# Patient Record
Sex: Female | Born: 1940 | Race: White | Hispanic: No | Smoking: Current every day smoker
Health system: Southern US, Community
[De-identification: ages and names within clinical notes are randomized; demographics above are authoritative.]

## PROBLEM LIST (undated history)

## (undated) DIAGNOSIS — G5600 Carpal tunnel syndrome, unspecified upper limb: Secondary | ICD-10-CM

## (undated) DIAGNOSIS — R0600 Dyspnea, unspecified: Secondary | ICD-10-CM

## (undated) DIAGNOSIS — E559 Vitamin D deficiency, unspecified: Secondary | ICD-10-CM

## (undated) DIAGNOSIS — C50919 Malignant neoplasm of unspecified site of unspecified female breast: Secondary | ICD-10-CM

## (undated) DIAGNOSIS — J189 Pneumonia, unspecified organism: Secondary | ICD-10-CM

## (undated) DIAGNOSIS — J449 Chronic obstructive pulmonary disease, unspecified: Secondary | ICD-10-CM

## (undated) DIAGNOSIS — R42 Dizziness and giddiness: Secondary | ICD-10-CM

## (undated) DIAGNOSIS — M810 Age-related osteoporosis without current pathological fracture: Secondary | ICD-10-CM

## (undated) DIAGNOSIS — E213 Hyperparathyroidism, unspecified: Secondary | ICD-10-CM

## (undated) DIAGNOSIS — E785 Hyperlipidemia, unspecified: Secondary | ICD-10-CM

## (undated) DIAGNOSIS — M069 Rheumatoid arthritis, unspecified: Secondary | ICD-10-CM

## (undated) HISTORY — PX: MASTECTOMY: SHX3

## (undated) HISTORY — DX: Dizziness and giddiness: R42

## (undated) HISTORY — DX: Age-related osteoporosis without current pathological fracture: M81.0

## (undated) HISTORY — PX: VESICOVAGINAL FISTULA CLOSURE W/ TAH: SUR271

## (undated) HISTORY — DX: Hyperlipidemia, unspecified: E78.5

## (undated) HISTORY — PX: CATARACT EXTRACTION: SUR2

## (undated) HISTORY — DX: Rheumatoid arthritis, unspecified: M06.9

## (undated) HISTORY — DX: Chronic obstructive pulmonary disease, unspecified: J44.9

## (undated) HISTORY — DX: Hypercalcemia: E83.52

## (undated) HISTORY — DX: Pneumonia, unspecified organism: J18.9

## (undated) HISTORY — DX: Hyperparathyroidism, unspecified: E21.3

## (undated) HISTORY — DX: Malignant neoplasm of unspecified site of unspecified female breast: C50.919

## (undated) HISTORY — DX: Vitamin D deficiency, unspecified: E55.9

## (undated) HISTORY — PX: CARPAL TUNNEL RELEASE: SHX101

## (undated) HISTORY — DX: Carpal tunnel syndrome, unspecified upper limb: G56.00

---

## 2009-08-21 DIAGNOSIS — M069 Rheumatoid arthritis, unspecified: Secondary | ICD-10-CM | POA: Insufficient documentation

## 2010-01-14 ENCOUNTER — Emergency Department (HOSPITAL_COMMUNITY): Admission: EM | Admit: 2010-01-14 | Discharge: 2010-01-14 | Payer: Self-pay | Admitting: Emergency Medicine

## 2011-01-08 DIAGNOSIS — Z79899 Other long term (current) drug therapy: Secondary | ICD-10-CM | POA: Insufficient documentation

## 2011-02-18 DIAGNOSIS — R5383 Other fatigue: Secondary | ICD-10-CM | POA: Insufficient documentation

## 2011-03-24 DIAGNOSIS — M898X9 Other specified disorders of bone, unspecified site: Secondary | ICD-10-CM | POA: Diagnosis not present

## 2011-03-24 DIAGNOSIS — D492 Neoplasm of unspecified behavior of bone, soft tissue, and skin: Secondary | ICD-10-CM | POA: Diagnosis not present

## 2011-03-24 DIAGNOSIS — M6740 Ganglion, unspecified site: Secondary | ICD-10-CM | POA: Diagnosis not present

## 2011-04-01 DIAGNOSIS — M898X9 Other specified disorders of bone, unspecified site: Secondary | ICD-10-CM | POA: Diagnosis not present

## 2011-04-01 DIAGNOSIS — M7130 Other bursal cyst, unspecified site: Secondary | ICD-10-CM | POA: Diagnosis not present

## 2011-04-01 DIAGNOSIS — M6740 Ganglion, unspecified site: Secondary | ICD-10-CM | POA: Diagnosis not present

## 2011-04-13 DIAGNOSIS — M069 Rheumatoid arthritis, unspecified: Secondary | ICD-10-CM | POA: Diagnosis not present

## 2011-04-13 DIAGNOSIS — Z79899 Other long term (current) drug therapy: Secondary | ICD-10-CM | POA: Diagnosis not present

## 2011-04-29 DIAGNOSIS — M069 Rheumatoid arthritis, unspecified: Secondary | ICD-10-CM | POA: Diagnosis not present

## 2011-05-13 DIAGNOSIS — M069 Rheumatoid arthritis, unspecified: Secondary | ICD-10-CM | POA: Diagnosis not present

## 2011-05-26 DIAGNOSIS — E785 Hyperlipidemia, unspecified: Secondary | ICD-10-CM | POA: Diagnosis not present

## 2011-05-26 DIAGNOSIS — M069 Rheumatoid arthritis, unspecified: Secondary | ICD-10-CM | POA: Diagnosis not present

## 2011-05-26 DIAGNOSIS — I10 Essential (primary) hypertension: Secondary | ICD-10-CM | POA: Diagnosis not present

## 2011-05-26 DIAGNOSIS — J449 Chronic obstructive pulmonary disease, unspecified: Secondary | ICD-10-CM | POA: Diagnosis not present

## 2011-05-26 DIAGNOSIS — R634 Abnormal weight loss: Secondary | ICD-10-CM | POA: Diagnosis not present

## 2011-06-17 DIAGNOSIS — M069 Rheumatoid arthritis, unspecified: Secondary | ICD-10-CM | POA: Diagnosis not present

## 2011-06-30 DIAGNOSIS — Z853 Personal history of malignant neoplasm of breast: Secondary | ICD-10-CM | POA: Diagnosis not present

## 2011-06-30 DIAGNOSIS — Z803 Family history of malignant neoplasm of breast: Secondary | ICD-10-CM | POA: Diagnosis not present

## 2011-06-30 DIAGNOSIS — Z9010 Acquired absence of unspecified breast and nipple: Secondary | ICD-10-CM | POA: Diagnosis not present

## 2011-07-15 DIAGNOSIS — Z79899 Other long term (current) drug therapy: Secondary | ICD-10-CM | POA: Diagnosis not present

## 2011-07-15 DIAGNOSIS — M069 Rheumatoid arthritis, unspecified: Secondary | ICD-10-CM | POA: Diagnosis not present

## 2011-08-11 DIAGNOSIS — M069 Rheumatoid arthritis, unspecified: Secondary | ICD-10-CM | POA: Diagnosis not present

## 2011-09-02 DIAGNOSIS — L600 Ingrowing nail: Secondary | ICD-10-CM | POA: Diagnosis not present

## 2011-09-02 DIAGNOSIS — M069 Rheumatoid arthritis, unspecified: Secondary | ICD-10-CM | POA: Diagnosis not present

## 2011-09-02 DIAGNOSIS — M2010 Hallux valgus (acquired), unspecified foot: Secondary | ICD-10-CM | POA: Diagnosis not present

## 2011-10-06 DIAGNOSIS — Z79899 Other long term (current) drug therapy: Secondary | ICD-10-CM | POA: Diagnosis not present

## 2011-10-06 DIAGNOSIS — M069 Rheumatoid arthritis, unspecified: Secondary | ICD-10-CM | POA: Diagnosis not present

## 2011-10-13 DIAGNOSIS — R21 Rash and other nonspecific skin eruption: Secondary | ICD-10-CM | POA: Diagnosis not present

## 2011-10-13 DIAGNOSIS — Z888 Allergy status to other drugs, medicaments and biological substances status: Secondary | ICD-10-CM | POA: Diagnosis not present

## 2011-10-13 DIAGNOSIS — I10 Essential (primary) hypertension: Secondary | ICD-10-CM | POA: Diagnosis not present

## 2011-10-13 DIAGNOSIS — M069 Rheumatoid arthritis, unspecified: Secondary | ICD-10-CM | POA: Diagnosis not present

## 2011-10-13 DIAGNOSIS — B028 Zoster with other complications: Secondary | ICD-10-CM | POA: Diagnosis not present

## 2011-10-29 DIAGNOSIS — M2040 Other hammer toe(s) (acquired), unspecified foot: Secondary | ICD-10-CM | POA: Diagnosis not present

## 2011-10-29 DIAGNOSIS — M2010 Hallux valgus (acquired), unspecified foot: Secondary | ICD-10-CM | POA: Diagnosis not present

## 2011-11-05 DIAGNOSIS — L723 Sebaceous cyst: Secondary | ICD-10-CM | POA: Diagnosis not present

## 2011-11-16 DIAGNOSIS — J42 Unspecified chronic bronchitis: Secondary | ICD-10-CM | POA: Diagnosis not present

## 2011-11-16 DIAGNOSIS — R05 Cough: Secondary | ICD-10-CM | POA: Diagnosis not present

## 2011-11-16 DIAGNOSIS — C50919 Malignant neoplasm of unspecified site of unspecified female breast: Secondary | ICD-10-CM | POA: Diagnosis not present

## 2011-11-16 DIAGNOSIS — M069 Rheumatoid arthritis, unspecified: Secondary | ICD-10-CM | POA: Diagnosis not present

## 2011-11-16 DIAGNOSIS — R059 Cough, unspecified: Secondary | ICD-10-CM | POA: Diagnosis not present

## 2011-11-16 DIAGNOSIS — J449 Chronic obstructive pulmonary disease, unspecified: Secondary | ICD-10-CM | POA: Diagnosis not present

## 2011-11-18 DIAGNOSIS — J449 Chronic obstructive pulmonary disease, unspecified: Secondary | ICD-10-CM | POA: Diagnosis not present

## 2011-11-18 DIAGNOSIS — J42 Unspecified chronic bronchitis: Secondary | ICD-10-CM | POA: Diagnosis not present

## 2011-12-01 DIAGNOSIS — R059 Cough, unspecified: Secondary | ICD-10-CM | POA: Diagnosis not present

## 2011-12-01 DIAGNOSIS — J449 Chronic obstructive pulmonary disease, unspecified: Secondary | ICD-10-CM | POA: Diagnosis not present

## 2011-12-01 DIAGNOSIS — R05 Cough: Secondary | ICD-10-CM | POA: Diagnosis not present

## 2011-12-08 DIAGNOSIS — M069 Rheumatoid arthritis, unspecified: Secondary | ICD-10-CM | POA: Diagnosis not present

## 2011-12-24 DIAGNOSIS — Z23 Encounter for immunization: Secondary | ICD-10-CM | POA: Diagnosis not present

## 2011-12-24 DIAGNOSIS — J4489 Other specified chronic obstructive pulmonary disease: Secondary | ICD-10-CM | POA: Diagnosis not present

## 2011-12-24 DIAGNOSIS — J449 Chronic obstructive pulmonary disease, unspecified: Secondary | ICD-10-CM | POA: Diagnosis not present

## 2011-12-24 DIAGNOSIS — J42 Unspecified chronic bronchitis: Secondary | ICD-10-CM | POA: Diagnosis not present

## 2012-01-12 DIAGNOSIS — K620 Anal polyp: Secondary | ICD-10-CM | POA: Diagnosis not present

## 2012-01-12 DIAGNOSIS — K5730 Diverticulosis of large intestine without perforation or abscess without bleeding: Secondary | ICD-10-CM | POA: Diagnosis not present

## 2012-01-12 DIAGNOSIS — K621 Rectal polyp: Secondary | ICD-10-CM | POA: Diagnosis not present

## 2012-01-12 DIAGNOSIS — D126 Benign neoplasm of colon, unspecified: Secondary | ICD-10-CM | POA: Diagnosis not present

## 2012-01-25 DIAGNOSIS — J449 Chronic obstructive pulmonary disease, unspecified: Secondary | ICD-10-CM | POA: Diagnosis not present

## 2012-01-25 DIAGNOSIS — J189 Pneumonia, unspecified organism: Secondary | ICD-10-CM | POA: Diagnosis not present

## 2012-01-25 DIAGNOSIS — M069 Rheumatoid arthritis, unspecified: Secondary | ICD-10-CM | POA: Diagnosis not present

## 2012-01-26 DIAGNOSIS — M069 Rheumatoid arthritis, unspecified: Secondary | ICD-10-CM | POA: Diagnosis not present

## 2012-01-26 DIAGNOSIS — Z79899 Other long term (current) drug therapy: Secondary | ICD-10-CM | POA: Diagnosis not present

## 2012-02-17 DIAGNOSIS — M810 Age-related osteoporosis without current pathological fracture: Secondary | ICD-10-CM | POA: Diagnosis not present

## 2012-03-07 DIAGNOSIS — M8448XA Pathological fracture, other site, initial encounter for fracture: Secondary | ICD-10-CM | POA: Diagnosis not present

## 2012-03-07 DIAGNOSIS — R059 Cough, unspecified: Secondary | ICD-10-CM | POA: Diagnosis not present

## 2012-03-07 DIAGNOSIS — R05 Cough: Secondary | ICD-10-CM | POA: Diagnosis not present

## 2012-03-07 DIAGNOSIS — J449 Chronic obstructive pulmonary disease, unspecified: Secondary | ICD-10-CM | POA: Diagnosis not present

## 2012-03-14 DIAGNOSIS — M949 Disorder of cartilage, unspecified: Secondary | ICD-10-CM | POA: Diagnosis not present

## 2012-03-14 DIAGNOSIS — M899 Disorder of bone, unspecified: Secondary | ICD-10-CM | POA: Diagnosis not present

## 2012-03-23 DIAGNOSIS — M069 Rheumatoid arthritis, unspecified: Secondary | ICD-10-CM | POA: Diagnosis not present

## 2012-05-03 DIAGNOSIS — M069 Rheumatoid arthritis, unspecified: Secondary | ICD-10-CM | POA: Diagnosis not present

## 2012-05-04 DIAGNOSIS — Z79899 Other long term (current) drug therapy: Secondary | ICD-10-CM | POA: Diagnosis not present

## 2012-05-04 DIAGNOSIS — E785 Hyperlipidemia, unspecified: Secondary | ICD-10-CM | POA: Diagnosis not present

## 2012-05-04 DIAGNOSIS — J4489 Other specified chronic obstructive pulmonary disease: Secondary | ICD-10-CM | POA: Diagnosis not present

## 2012-05-04 DIAGNOSIS — M069 Rheumatoid arthritis, unspecified: Secondary | ICD-10-CM | POA: Diagnosis not present

## 2012-05-04 DIAGNOSIS — R634 Abnormal weight loss: Secondary | ICD-10-CM | POA: Diagnosis not present

## 2012-05-04 DIAGNOSIS — Z0000 Encounter for general adult medical examination without abnormal findings: Secondary | ICD-10-CM | POA: Diagnosis not present

## 2012-05-04 DIAGNOSIS — J449 Chronic obstructive pulmonary disease, unspecified: Secondary | ICD-10-CM | POA: Diagnosis not present

## 2012-05-04 DIAGNOSIS — R002 Palpitations: Secondary | ICD-10-CM | POA: Diagnosis not present

## 2012-05-17 DIAGNOSIS — M79609 Pain in unspecified limb: Secondary | ICD-10-CM | POA: Diagnosis not present

## 2012-05-17 DIAGNOSIS — M810 Age-related osteoporosis without current pathological fracture: Secondary | ICD-10-CM | POA: Diagnosis not present

## 2012-05-17 DIAGNOSIS — M069 Rheumatoid arthritis, unspecified: Secondary | ICD-10-CM | POA: Diagnosis not present

## 2012-05-17 DIAGNOSIS — M159 Polyosteoarthritis, unspecified: Secondary | ICD-10-CM | POA: Diagnosis not present

## 2012-06-06 DIAGNOSIS — E785 Hyperlipidemia, unspecified: Secondary | ICD-10-CM | POA: Diagnosis not present

## 2012-06-06 DIAGNOSIS — M069 Rheumatoid arthritis, unspecified: Secondary | ICD-10-CM | POA: Diagnosis not present

## 2012-06-06 DIAGNOSIS — J449 Chronic obstructive pulmonary disease, unspecified: Secondary | ICD-10-CM | POA: Diagnosis not present

## 2012-06-16 DIAGNOSIS — M069 Rheumatoid arthritis, unspecified: Secondary | ICD-10-CM | POA: Diagnosis not present

## 2012-06-28 DIAGNOSIS — M810 Age-related osteoporosis without current pathological fracture: Secondary | ICD-10-CM | POA: Diagnosis not present

## 2012-06-28 DIAGNOSIS — M159 Polyosteoarthritis, unspecified: Secondary | ICD-10-CM | POA: Diagnosis not present

## 2012-06-28 DIAGNOSIS — M069 Rheumatoid arthritis, unspecified: Secondary | ICD-10-CM | POA: Diagnosis not present

## 2012-07-28 DIAGNOSIS — M069 Rheumatoid arthritis, unspecified: Secondary | ICD-10-CM | POA: Diagnosis not present

## 2012-09-05 DIAGNOSIS — I872 Venous insufficiency (chronic) (peripheral): Secondary | ICD-10-CM | POA: Diagnosis not present

## 2012-09-08 DIAGNOSIS — M069 Rheumatoid arthritis, unspecified: Secondary | ICD-10-CM | POA: Diagnosis not present

## 2012-09-27 DIAGNOSIS — M47812 Spondylosis without myelopathy or radiculopathy, cervical region: Secondary | ICD-10-CM | POA: Diagnosis not present

## 2012-09-27 DIAGNOSIS — M810 Age-related osteoporosis without current pathological fracture: Secondary | ICD-10-CM | POA: Diagnosis not present

## 2012-09-27 DIAGNOSIS — M542 Cervicalgia: Secondary | ICD-10-CM | POA: Diagnosis not present

## 2012-09-27 DIAGNOSIS — M069 Rheumatoid arthritis, unspecified: Secondary | ICD-10-CM | POA: Diagnosis not present

## 2012-09-27 DIAGNOSIS — M159 Polyosteoarthritis, unspecified: Secondary | ICD-10-CM | POA: Diagnosis not present

## 2012-10-12 DIAGNOSIS — M069 Rheumatoid arthritis, unspecified: Secondary | ICD-10-CM | POA: Diagnosis not present

## 2012-11-08 DIAGNOSIS — D239 Other benign neoplasm of skin, unspecified: Secondary | ICD-10-CM | POA: Diagnosis not present

## 2012-11-08 DIAGNOSIS — L723 Sebaceous cyst: Secondary | ICD-10-CM | POA: Diagnosis not present

## 2012-11-08 DIAGNOSIS — L821 Other seborrheic keratosis: Secondary | ICD-10-CM | POA: Diagnosis not present

## 2012-11-16 DIAGNOSIS — M069 Rheumatoid arthritis, unspecified: Secondary | ICD-10-CM | POA: Diagnosis not present

## 2012-12-07 DIAGNOSIS — J4489 Other specified chronic obstructive pulmonary disease: Secondary | ICD-10-CM | POA: Diagnosis not present

## 2012-12-07 DIAGNOSIS — J449 Chronic obstructive pulmonary disease, unspecified: Secondary | ICD-10-CM | POA: Diagnosis not present

## 2012-12-07 DIAGNOSIS — Z23 Encounter for immunization: Secondary | ICD-10-CM | POA: Diagnosis not present

## 2012-12-07 DIAGNOSIS — M069 Rheumatoid arthritis, unspecified: Secondary | ICD-10-CM | POA: Diagnosis not present

## 2012-12-07 DIAGNOSIS — Z87898 Personal history of other specified conditions: Secondary | ICD-10-CM | POA: Diagnosis not present

## 2012-12-07 DIAGNOSIS — M810 Age-related osteoporosis without current pathological fracture: Secondary | ICD-10-CM | POA: Diagnosis not present

## 2012-12-07 DIAGNOSIS — I10 Essential (primary) hypertension: Secondary | ICD-10-CM | POA: Diagnosis not present

## 2012-12-20 DIAGNOSIS — M069 Rheumatoid arthritis, unspecified: Secondary | ICD-10-CM | POA: Diagnosis not present

## 2012-12-28 DIAGNOSIS — M069 Rheumatoid arthritis, unspecified: Secondary | ICD-10-CM | POA: Diagnosis not present

## 2012-12-28 DIAGNOSIS — M810 Age-related osteoporosis without current pathological fracture: Secondary | ICD-10-CM | POA: Diagnosis not present

## 2012-12-28 DIAGNOSIS — M159 Polyosteoarthritis, unspecified: Secondary | ICD-10-CM | POA: Diagnosis not present

## 2012-12-28 DIAGNOSIS — M542 Cervicalgia: Secondary | ICD-10-CM | POA: Diagnosis not present

## 2012-12-30 DIAGNOSIS — M810 Age-related osteoporosis without current pathological fracture: Secondary | ICD-10-CM | POA: Diagnosis not present

## 2013-01-24 DIAGNOSIS — M069 Rheumatoid arthritis, unspecified: Secondary | ICD-10-CM | POA: Diagnosis not present

## 2013-02-21 DIAGNOSIS — M069 Rheumatoid arthritis, unspecified: Secondary | ICD-10-CM | POA: Diagnosis not present

## 2013-03-21 DIAGNOSIS — I10 Essential (primary) hypertension: Secondary | ICD-10-CM | POA: Diagnosis not present

## 2013-03-21 DIAGNOSIS — L259 Unspecified contact dermatitis, unspecified cause: Secondary | ICD-10-CM | POA: Diagnosis not present

## 2013-03-21 DIAGNOSIS — M069 Rheumatoid arthritis, unspecified: Secondary | ICD-10-CM | POA: Diagnosis not present

## 2013-03-21 DIAGNOSIS — E213 Hyperparathyroidism, unspecified: Secondary | ICD-10-CM | POA: Diagnosis not present

## 2013-03-21 DIAGNOSIS — T50995A Adverse effect of other drugs, medicaments and biological substances, initial encounter: Secondary | ICD-10-CM | POA: Diagnosis not present

## 2013-03-21 DIAGNOSIS — J449 Chronic obstructive pulmonary disease, unspecified: Secondary | ICD-10-CM | POA: Diagnosis not present

## 2013-03-21 DIAGNOSIS — R21 Rash and other nonspecific skin eruption: Secondary | ICD-10-CM | POA: Diagnosis not present

## 2013-03-28 DIAGNOSIS — M069 Rheumatoid arthritis, unspecified: Secondary | ICD-10-CM | POA: Diagnosis not present

## 2013-03-30 DIAGNOSIS — M542 Cervicalgia: Secondary | ICD-10-CM | POA: Diagnosis not present

## 2013-03-30 DIAGNOSIS — M069 Rheumatoid arthritis, unspecified: Secondary | ICD-10-CM | POA: Diagnosis not present

## 2013-03-30 DIAGNOSIS — M159 Polyosteoarthritis, unspecified: Secondary | ICD-10-CM | POA: Diagnosis not present

## 2013-03-30 DIAGNOSIS — M810 Age-related osteoporosis without current pathological fracture: Secondary | ICD-10-CM | POA: Diagnosis not present

## 2013-05-08 DIAGNOSIS — M069 Rheumatoid arthritis, unspecified: Secondary | ICD-10-CM | POA: Diagnosis not present

## 2013-06-19 DIAGNOSIS — M069 Rheumatoid arthritis, unspecified: Secondary | ICD-10-CM | POA: Diagnosis not present

## 2013-07-05 DIAGNOSIS — M069 Rheumatoid arthritis, unspecified: Secondary | ICD-10-CM | POA: Diagnosis not present

## 2013-07-05 DIAGNOSIS — M542 Cervicalgia: Secondary | ICD-10-CM | POA: Diagnosis not present

## 2013-07-05 DIAGNOSIS — M159 Polyosteoarthritis, unspecified: Secondary | ICD-10-CM | POA: Diagnosis not present

## 2013-07-05 DIAGNOSIS — M810 Age-related osteoporosis without current pathological fracture: Secondary | ICD-10-CM | POA: Diagnosis not present

## 2013-07-13 DIAGNOSIS — L723 Sebaceous cyst: Secondary | ICD-10-CM | POA: Diagnosis not present

## 2013-07-13 DIAGNOSIS — Z808 Family history of malignant neoplasm of other organs or systems: Secondary | ICD-10-CM | POA: Diagnosis not present

## 2013-07-13 DIAGNOSIS — L259 Unspecified contact dermatitis, unspecified cause: Secondary | ICD-10-CM | POA: Diagnosis not present

## 2013-07-25 DIAGNOSIS — M069 Rheumatoid arthritis, unspecified: Secondary | ICD-10-CM | POA: Diagnosis not present

## 2013-08-29 DIAGNOSIS — M069 Rheumatoid arthritis, unspecified: Secondary | ICD-10-CM | POA: Diagnosis not present

## 2013-08-30 DIAGNOSIS — R634 Abnormal weight loss: Secondary | ICD-10-CM | POA: Diagnosis not present

## 2013-08-30 DIAGNOSIS — R05 Cough: Secondary | ICD-10-CM | POA: Diagnosis not present

## 2013-08-30 DIAGNOSIS — M069 Rheumatoid arthritis, unspecified: Secondary | ICD-10-CM | POA: Diagnosis not present

## 2013-08-30 DIAGNOSIS — J449 Chronic obstructive pulmonary disease, unspecified: Secondary | ICD-10-CM | POA: Diagnosis not present

## 2013-08-30 DIAGNOSIS — F17200 Nicotine dependence, unspecified, uncomplicated: Secondary | ICD-10-CM | POA: Diagnosis not present

## 2013-08-30 DIAGNOSIS — E785 Hyperlipidemia, unspecified: Secondary | ICD-10-CM | POA: Diagnosis not present

## 2013-08-30 DIAGNOSIS — R059 Cough, unspecified: Secondary | ICD-10-CM | POA: Diagnosis not present

## 2013-08-30 DIAGNOSIS — J984 Other disorders of lung: Secondary | ICD-10-CM | POA: Diagnosis not present

## 2013-09-11 DIAGNOSIS — F17200 Nicotine dependence, unspecified, uncomplicated: Secondary | ICD-10-CM | POA: Diagnosis not present

## 2013-09-11 DIAGNOSIS — Z8701 Personal history of pneumonia (recurrent): Secondary | ICD-10-CM | POA: Diagnosis not present

## 2013-09-11 DIAGNOSIS — M069 Rheumatoid arthritis, unspecified: Secondary | ICD-10-CM | POA: Diagnosis not present

## 2013-09-11 DIAGNOSIS — J449 Chronic obstructive pulmonary disease, unspecified: Secondary | ICD-10-CM | POA: Diagnosis not present

## 2013-09-26 DIAGNOSIS — M069 Rheumatoid arthritis, unspecified: Secondary | ICD-10-CM | POA: Diagnosis not present

## 2013-10-04 DIAGNOSIS — M159 Polyosteoarthritis, unspecified: Secondary | ICD-10-CM | POA: Diagnosis not present

## 2013-10-04 DIAGNOSIS — M542 Cervicalgia: Secondary | ICD-10-CM | POA: Diagnosis not present

## 2013-10-04 DIAGNOSIS — M069 Rheumatoid arthritis, unspecified: Secondary | ICD-10-CM | POA: Diagnosis not present

## 2013-10-04 DIAGNOSIS — M25549 Pain in joints of unspecified hand: Secondary | ICD-10-CM | POA: Diagnosis not present

## 2013-10-04 DIAGNOSIS — M810 Age-related osteoporosis without current pathological fracture: Secondary | ICD-10-CM | POA: Diagnosis not present

## 2013-11-02 DIAGNOSIS — M069 Rheumatoid arthritis, unspecified: Secondary | ICD-10-CM | POA: Diagnosis not present

## 2013-12-07 DIAGNOSIS — M0589 Other rheumatoid arthritis with rheumatoid factor of multiple sites: Secondary | ICD-10-CM | POA: Diagnosis not present

## 2013-12-07 DIAGNOSIS — M0680 Other specified rheumatoid arthritis, unspecified site: Secondary | ICD-10-CM | POA: Diagnosis not present

## 2013-12-13 ENCOUNTER — Other Ambulatory Visit: Payer: Self-pay | Admitting: Internal Medicine

## 2013-12-13 DIAGNOSIS — R109 Unspecified abdominal pain: Secondary | ICD-10-CM

## 2013-12-13 DIAGNOSIS — M069 Rheumatoid arthritis, unspecified: Secondary | ICD-10-CM | POA: Diagnosis not present

## 2013-12-13 DIAGNOSIS — E213 Hyperparathyroidism, unspecified: Secondary | ICD-10-CM | POA: Diagnosis not present

## 2013-12-13 DIAGNOSIS — Z23 Encounter for immunization: Secondary | ICD-10-CM | POA: Diagnosis not present

## 2013-12-13 DIAGNOSIS — J449 Chronic obstructive pulmonary disease, unspecified: Secondary | ICD-10-CM | POA: Diagnosis not present

## 2013-12-14 ENCOUNTER — Ambulatory Visit
Admission: RE | Admit: 2013-12-14 | Discharge: 2013-12-14 | Disposition: A | Discharge status: Home / Self Care | Payer: Medicare Other | Source: Ambulatory Visit | Attending: Internal Medicine | Admitting: Internal Medicine

## 2013-12-14 DIAGNOSIS — K6389 Other specified diseases of intestine: Secondary | ICD-10-CM | POA: Diagnosis not present

## 2013-12-14 DIAGNOSIS — K5900 Constipation, unspecified: Secondary | ICD-10-CM | POA: Diagnosis not present

## 2013-12-14 DIAGNOSIS — K4090 Unilateral inguinal hernia, without obstruction or gangrene, not specified as recurrent: Secondary | ICD-10-CM | POA: Diagnosis not present

## 2013-12-14 DIAGNOSIS — Z853 Personal history of malignant neoplasm of breast: Secondary | ICD-10-CM | POA: Diagnosis not present

## 2013-12-14 DIAGNOSIS — R109 Unspecified abdominal pain: Secondary | ICD-10-CM

## 2013-12-14 MED ORDER — IOHEXOL 300 MG/ML  SOLN
75.0000 mL | Freq: Once | INTRAMUSCULAR | Status: AC | PRN
  Administered 2013-12-14: 75 mL via INTRAVENOUS

## 2013-12-21 DIAGNOSIS — Z808 Family history of malignant neoplasm of other organs or systems: Secondary | ICD-10-CM | POA: Diagnosis not present

## 2013-12-21 DIAGNOSIS — L821 Other seborrheic keratosis: Secondary | ICD-10-CM | POA: Diagnosis not present

## 2013-12-21 DIAGNOSIS — D239 Other benign neoplasm of skin, unspecified: Secondary | ICD-10-CM | POA: Diagnosis not present

## 2013-12-21 DIAGNOSIS — L853 Xerosis cutis: Secondary | ICD-10-CM | POA: Diagnosis not present

## 2013-12-21 DIAGNOSIS — D1801 Hemangioma of skin and subcutaneous tissue: Secondary | ICD-10-CM | POA: Diagnosis not present

## 2013-12-21 DIAGNOSIS — L814 Other melanin hyperpigmentation: Secondary | ICD-10-CM | POA: Diagnosis not present

## 2014-01-04 DIAGNOSIS — M5030 Other cervical disc degeneration, unspecified cervical region: Secondary | ICD-10-CM | POA: Diagnosis not present

## 2014-01-04 DIAGNOSIS — M0589 Other rheumatoid arthritis with rheumatoid factor of multiple sites: Secondary | ICD-10-CM | POA: Diagnosis not present

## 2014-01-04 DIAGNOSIS — M810 Age-related osteoporosis without current pathological fracture: Secondary | ICD-10-CM | POA: Diagnosis not present

## 2014-01-04 DIAGNOSIS — M150 Primary generalized (osteo)arthritis: Secondary | ICD-10-CM | POA: Diagnosis not present

## 2014-01-08 DIAGNOSIS — M810 Age-related osteoporosis without current pathological fracture: Secondary | ICD-10-CM | POA: Diagnosis not present

## 2014-01-11 DIAGNOSIS — M0589 Other rheumatoid arthritis with rheumatoid factor of multiple sites: Secondary | ICD-10-CM | POA: Diagnosis not present

## 2014-02-06 DIAGNOSIS — M069 Rheumatoid arthritis, unspecified: Secondary | ICD-10-CM | POA: Diagnosis not present

## 2014-02-06 DIAGNOSIS — E785 Hyperlipidemia, unspecified: Secondary | ICD-10-CM | POA: Diagnosis not present

## 2014-02-06 DIAGNOSIS — E875 Hyperkalemia: Secondary | ICD-10-CM | POA: Diagnosis not present

## 2014-02-08 ENCOUNTER — Ambulatory Visit: Payer: Medicare Other | Admitting: Rehabilitative and Restorative Service Providers"

## 2014-02-13 DIAGNOSIS — M0589 Other rheumatoid arthritis with rheumatoid factor of multiple sites: Secondary | ICD-10-CM | POA: Diagnosis not present

## 2014-02-28 DIAGNOSIS — Z0000 Encounter for general adult medical examination without abnormal findings: Secondary | ICD-10-CM | POA: Diagnosis not present

## 2014-02-28 DIAGNOSIS — E875 Hyperkalemia: Secondary | ICD-10-CM | POA: Diagnosis not present

## 2014-02-28 DIAGNOSIS — E785 Hyperlipidemia, unspecified: Secondary | ICD-10-CM | POA: Diagnosis not present

## 2014-03-07 DIAGNOSIS — M0589 Other rheumatoid arthritis with rheumatoid factor of multiple sites: Secondary | ICD-10-CM | POA: Diagnosis not present

## 2014-03-07 DIAGNOSIS — E213 Hyperparathyroidism, unspecified: Secondary | ICD-10-CM | POA: Diagnosis not present

## 2014-03-07 DIAGNOSIS — R918 Other nonspecific abnormal finding of lung field: Secondary | ICD-10-CM | POA: Diagnosis not present

## 2014-03-07 DIAGNOSIS — E559 Vitamin D deficiency, unspecified: Secondary | ICD-10-CM | POA: Diagnosis not present

## 2014-03-12 DIAGNOSIS — E789 Disorder of lipoprotein metabolism, unspecified: Secondary | ICD-10-CM | POA: Diagnosis not present

## 2014-03-15 ENCOUNTER — Encounter: Payer: Self-pay | Admitting: Internal Medicine

## 2014-03-15 ENCOUNTER — Ambulatory Visit (INDEPENDENT_AMBULATORY_CARE_PROVIDER_SITE_OTHER)
Admission: RE | Admit: 2014-03-15 | Discharge: 2014-03-15 | Disposition: A | Discharge status: Home / Self Care | Payer: Medicare Other | Source: Ambulatory Visit | Attending: Internal Medicine | Admitting: Internal Medicine

## 2014-03-15 ENCOUNTER — Ambulatory Visit (INDEPENDENT_AMBULATORY_CARE_PROVIDER_SITE_OTHER): Payer: Medicare Other | Admitting: Internal Medicine

## 2014-03-15 ENCOUNTER — Encounter (INDEPENDENT_AMBULATORY_CARE_PROVIDER_SITE_OTHER): Payer: Self-pay

## 2014-03-15 VITALS — BP 118/68 | HR 73 | Temp 98.0°F | Ht 61.25 in | Wt 94.4 lb

## 2014-03-15 DIAGNOSIS — J449 Chronic obstructive pulmonary disease, unspecified: Secondary | ICD-10-CM

## 2014-03-15 DIAGNOSIS — R918 Other nonspecific abnormal finding of lung field: Secondary | ICD-10-CM | POA: Diagnosis not present

## 2014-03-15 DIAGNOSIS — Z853 Personal history of malignant neoplasm of breast: Secondary | ICD-10-CM | POA: Diagnosis not present

## 2014-03-15 DIAGNOSIS — F17210 Nicotine dependence, cigarettes, uncomplicated: Secondary | ICD-10-CM

## 2014-03-15 MED ORDER — MOMETASONE FURO-FORMOTEROL FUM 100-5 MCG/ACT IN AERO: 1.0000 | INHALATION_SPRAY | Freq: Two times a day (BID) | RESPIRATORY_TRACT | Status: DC

## 2014-03-15 NOTE — Patient Instructions (Addendum)
Plan A = Automatic = dulera 100 Take 2 puffs first thing in am and then another 2 puffs about 12 hours later.   Plan B = backup  Only use your albuterol (proair) as a rescue medication to be used if you can't catch your breath by resting or doing a relaxed purse lip breathing pattern.  - The less you use it, the better it will work when you need it. - Ok to use up to 2 puffs  every 4 hours if you must but call for immediate appointment if use goes up over your usual need - Don't leave home without it !!  (think of it like the spare tire for your car)   Please remember to go to the  x-ray department downstairs for your tests - we will call you with the results when they are available.  Ocean nasal spray   The key is to stop smoking completely before smoking completely stops you!      Please schedule a follow up office visit in 6 weeks, call sooner if needed

## 2014-03-15 NOTE — Progress Notes (Signed)
Subjective:    Patient ID: Olivia Hooper, female    DOB: [birth year]   MRN: [id number]  HPI  26 yowf "quit smoking" in Oct 2015 referred 03/15/14 to pulmonary clinic by [name] for eval of lung nodules:   03/15/2014 1st [facility] office visit/ Baltasar Twilley   Chief Complaint  Patient presents with  . Pulmonary Consult    Referred by Dr. [name] for eval of pulmonary nodules. Pt states that she had a chest chest done 4-5 yrs ago b/c she was having "alot of wheezing".  She states that she has cough and SOB "for a long time". She states she only gets SOB if she walks "a long distance" or walks up alot of steps. Her cough is mainly non prod. Sometimes produces some clear sputum in the am's.   indolent onset minimally progressive doe x 5 years x worse in hot or weather or inclines  can walk flat nl pace all day long with no resting symptoms Variably cough esp in am min clear mucus, not worse in am , never bloody  Has dulera and saba not using either.   No obvious day to day or daytime variabilty or assoc   cp or chest tightness, subjective wheeze overt sinus or hb symptoms. No unusual exp hx or h/o childhood pna/ asthma or knowledge of premature birth.  Sleeping ok without nocturnal  or early am exacerbation  of respiratory  c/o's or need for noct saba. Also denies any obvious fluctuation of symptoms with weather or environmental changes or other aggravating or alleviating factors except as outlined above   Current Medications, Allergies, Complete Past Medical History, Past Surgical History, Family History, and Social History were reviewed in [employer] record.          Review of Systems  Constitutional: Negative for fever, chills and unexpected weight change.  HENT: Positive for congestion. Negative for dental problem, ear pain, nosebleeds, postnasal drip, rhinorrhea, sinus pressure, sneezing, sore throat, trouble swallowing and voice change.   Eyes: Negative for  visual disturbance.  Respiratory: Positive for cough and shortness of breath. Negative for choking.   Cardiovascular: Negative for chest pain and leg swelling.  Gastrointestinal: Negative for vomiting, abdominal pain and diarrhea.  Genitourinary: Negative for difficulty urinating.  Musculoskeletal: Positive for arthralgias.  Skin: Positive for rash.  Neurological: Negative for tremors, syncope and headaches.  Hematological: Does not bruise/bleed easily.       Objective:   Physical Exam  Thin amb wf nad with ecchymosis below L eye   Wt Readings from Last 3 Encounters:  03/15/14 94 lb 6.4 oz (42.82 kg)    Vital signs reviewed    HEENT mild turbinate edema.  Edentulous Oropharynx no thrush or excess pnd or cobblestoning.  No JVD or cervical adenopathy. Mild accessory muscle hypertrophy. Trachea midline, nl thryroid. Chest was hyperinflated by percussion with diminished breath sounds and moderate increased exp time with distant mid bilateralexp wheeze. Hoover sign positive at mid inspiration. Regular rate and rhythm without murmur gallop or rub or increase P2 or edema.  Abd: no hsm, nl excursion. Ext warm without cyanosis or clubbing.  MS pos for severe RA changes both hands, no obvious extensor nodules      CXR PA and Lateral:   03/15/2014 :   1. Infiltrate left mid lung cannot be excluded. No pulmonary nodules or mass lesions noted. 2. Left breast implant. 3. Stable mid thoracic vertebral body compression fracture.  Assessment & Plan:

## 2014-03-18 DIAGNOSIS — F17210 Nicotine dependence, cigarettes, uncomplicated: Secondary | ICD-10-CM | POA: Insufficient documentation

## 2014-03-18 DIAGNOSIS — R918 Other nonspecific abnormal finding of lung field: Secondary | ICD-10-CM | POA: Insufficient documentation

## 2014-03-18 NOTE — Assessment & Plan Note (Signed)
DDX of  difficult airways management all start with A and  include Adherence, Ace Inhibitors, Acid Reflux, Active Sinus Disease, Alpha 1 Antitripsin deficiency, Anxiety masquerading as Airways dz,  ABPA,  allergy(esp in young), Aspiration (esp in elderly), Adverse effects of DPI,  Active smokers, plus two Bs  = Bronchiectasis and Beta blocker use..and one C= CHF  Adherence is always the initial "prime suspect" and is a multilayered concern that requires a "trust but verify" approach in every patient - starting with knowing how to use medications, especially inhalers, correctly, keeping up with refills and understanding the fundamental difference between maintenance and prns vs those medications only taken for a very short course and then stopped and not refilled.  -  The proper method of use, as well as anticipated side effects, of a metered-dose inhaler are discussed and demonstrated to the patient. Improved effectiveness after extensive coaching during this visit to a level of approximately  75%  Active smoking greatest concern, discussed separately

## 2014-03-18 NOTE — Assessment & Plan Note (Signed)
None obvious on cxr and pt has severe RA(which causes lung nodules)  so going looking for microscopic nodules to "r/o ca"  in the setting of what looks like advanced copd is not in her best interest. Would simply follow up with cxr's yearly unless new symptoms warrant more aggressive surveillance.

## 2014-03-18 NOTE — Assessment & Plan Note (Signed)
>   3 min discussion  I took an extended  opportunity with this patient to outline the consequences of continued cigarette use  in airway disorders based on all the data we have from the multiple national lung health studies (perfomed over decades at millions of dollars in cost)  indicating that smoking cessation, not choice of inhalers or physicians, is the most important aspect of care.   

## 2014-03-19 ENCOUNTER — Telehealth: Payer: Self-pay | Admitting: Internal Medicine

## 2014-03-19 NOTE — Telephone Encounter (Signed)
Result Note     Call pt: Reviewed cxr and no acute change so no change in recommendations made at ov (no nodules)   I spoke with patient about results and she verbalized understanding and had no questions.

## 2014-03-19 NOTE — Progress Notes (Signed)
Quick Note:  LMTCB 1/11 ______

## 2014-03-23 DIAGNOSIS — M0589 Other rheumatoid arthritis with rheumatoid factor of multiple sites: Secondary | ICD-10-CM | POA: Diagnosis not present

## 2014-03-28 DIAGNOSIS — Z124 Encounter for screening for malignant neoplasm of cervix: Secondary | ICD-10-CM | POA: Diagnosis not present

## 2014-03-28 DIAGNOSIS — Z01419 Encounter for gynecological examination (general) (routine) without abnormal findings: Secondary | ICD-10-CM | POA: Diagnosis not present

## 2014-03-28 DIAGNOSIS — Z1231 Encounter for screening mammogram for malignant neoplasm of breast: Secondary | ICD-10-CM | POA: Diagnosis not present

## 2014-04-26 ENCOUNTER — Ambulatory Visit (INDEPENDENT_AMBULATORY_CARE_PROVIDER_SITE_OTHER)
Admission: RE | Admit: 2014-04-26 | Discharge: 2014-04-26 | Disposition: A | Discharge status: Home / Self Care | Payer: Medicare Other | Source: Ambulatory Visit | Attending: Internal Medicine | Admitting: Internal Medicine

## 2014-04-26 ENCOUNTER — Encounter: Payer: Self-pay | Admitting: Internal Medicine

## 2014-04-26 ENCOUNTER — Ambulatory Visit (INDEPENDENT_AMBULATORY_CARE_PROVIDER_SITE_OTHER): Payer: Medicare Other | Admitting: Internal Medicine

## 2014-04-26 VITALS — BP 128/74 | HR 74 | Ht 62.0 in | Wt 97.0 lb

## 2014-04-26 DIAGNOSIS — F17210 Nicotine dependence, cigarettes, uncomplicated: Secondary | ICD-10-CM

## 2014-04-26 DIAGNOSIS — R918 Other nonspecific abnormal finding of lung field: Secondary | ICD-10-CM

## 2014-04-26 DIAGNOSIS — J449 Chronic obstructive pulmonary disease, unspecified: Secondary | ICD-10-CM | POA: Diagnosis not present

## 2014-04-26 DIAGNOSIS — Z720 Tobacco use: Secondary | ICD-10-CM

## 2014-04-26 NOTE — Patient Instructions (Addendum)
Please remember to go to the  x-ray department downstairs for your tests - we will call you with the results when they are available.  The key is to stop smoking completely before smoking completely stops you!  Work on inhaler technique:  relax and gently blow all the way out then take a nice smooth deep breath back in, triggering the inhaler at same time you start breathing in.  Hold for up to 5 seconds if you can.  Rinse and gargle with water when done     Please schedule a follow up visit in 3 months but call sooner if needed with pfts on return

## 2014-04-26 NOTE — Progress Notes (Signed)
Subjective:    Patient ID: Olivia Hooper, female    DOB: [birth year]   MRN: [id number]    Brief patient profile:  33 yowf "quit smoking" in Oct 2015 referred 03/15/14 to pulmonary clinic by Olivia Hooper for eval of lung nodules:  History of Present Illness  03/15/2014 1st [facility] office visit/ Olivia Hooper   Chief Complaint  Patient presents with  . Pulmonary Consult    Referred by Dr. Jani Hooper for eval of pulmonary nodules. Pt states that she had a chest chest done 4-5 yrs ago b/c she was having "alot of wheezing".  She states that she has cough and SOB "for a long time". She states she only gets SOB if she walks "a long distance" or walks up alot of steps. Her cough is mainly non prod. Sometimes produces some clear sputum in the am's.   indolent onset minimally progressive doe x 5 years x worse in hot or weather or inclines  can walk flat nl pace all day long with no resting symptoms Variably cough esp in am min clear mucus, not worse in am , never bloody  Has dulera and saba not using either.  rec Plan A = Automatic = dulera 100 Take 2 puffs first thing in am and then another 2 puffs about 12 hours later.  Plan B = backup  Only use your albuterol (proair) as a rescue medication Please remember to go to the  x-ray department downstairs for your tests - we will call you with the results when they are available. Ocean nasal spray    04/26/2014 f/u ov/Olivia Hooper re: clinically severe copd/ pfts pending/ on dulera 100 2bid and prn saba hfa Chief Complaint  Patient presents with  . Follow-up    Pt states that her cough and SOB have improved some since the last visit. She is still smoking "sometimes". She has not used rescue inhaler since her last visit.    Not limited by breathing from desired activities  Except for steps/ steep inclines  No obvious day to day or daytime variabilty or assoc excess or purulent sputum production  or cp or chest tightness, subjective wheeze overt sinus or hb  symptoms. No unusual exp hx or h/o childhood pna/ asthma or knowledge of premature birth.  Sleeping ok without nocturnal  or early am exacerbation  of respiratory  c/o's or need for noct saba. Also denies any obvious fluctuation of symptoms with weather or environmental changes or other aggravating or alleviating factors except as outlined above   Current Medications, Allergies, Complete Past Medical History, Past Surgical History, Family History, and Social History were reviewed in [employer] record.  ROS  The following are not active complaints unless bolded sore throat, dysphagia, dental problems, itching, sneezing,  nasal congestion or excess/ purulent secretions, ear ache,   fever, chills, sweats, unintended wt loss, pleuritic or exertional cp, hemoptysis,  orthopnea pnd or leg swelling, presyncope, palpitations, heartburn, abdominal pain, anorexia, nausea, vomiting, diarrhea  or change in bowel or urinary habits, change in stools or urine, dysuria,hematuria,  rash, arthralgias, visual complaints, headache, numbness weakness or ataxia or problems with walking or coordination,  change in mood/affect or memory.                       Objective:   Physical Exam  Thin amb wf nad   Wt Readings from Last 3 Encounters:  04/26/14 97 lb (43.999 kg)  03/15/14 94 lb 6.4  oz (42.82 kg)    Vital signs reviewed    HEENT mild turbinate edema.  Edentulous Oropharynx no thrush or excess pnd or cobblestoning.  No JVD or cervical adenopathy. Mild accessory muscle hypertrophy. Trachea midline, nl thryroid. Chest was hyperinflated by percussion with diminished breath sounds and moderate increased exp time with distant mid bilateralexp wheeze. Hoover sign positive at mid inspiration. Regular rate and rhythm without murmur gallop or rub or increase P2 or edema.  Abd: no hsm, nl excursion. Ext warm without cyanosis or clubbing.  MS pos for severe RA changes both hands, no obvious  extensor nodules       I personally reviewed images and agree with radiology impression as follows:  CXR:  04/26/14  Mediastinum hilar structures are normal. Lungs are clear. No focal pulmonary nodule. No pleural effusion or pneumothorax. Heart size normal. Scoliosis thoracic spine. No acute bony abnormality. Stable mid thoracic vertebral body compression fracture. Osteopenia. Degenerative change. Prior left mastectomy.    Assessment & Plan:

## 2014-04-26 NOTE — Assessment & Plan Note (Signed)

## 2014-04-27 ENCOUNTER — Telehealth: Payer: Self-pay | Admitting: Internal Medicine

## 2014-04-27 DIAGNOSIS — M0589 Other rheumatoid arthritis with rheumatoid factor of multiple sites: Secondary | ICD-10-CM | POA: Diagnosis not present

## 2014-04-27 DIAGNOSIS — M069 Rheumatoid arthritis, unspecified: Secondary | ICD-10-CM | POA: Diagnosis not present

## 2014-04-27 NOTE — Telephone Encounter (Signed)
Notes Recorded by Tanda Rockers, MD on 04/26/2014 at 7:47 PM Call pt: Reviewed cxr and no acute change so no change in recommendations made at ov     Pt aware of results.  Nothing further needed.

## 2014-04-29 NOTE — Assessment & Plan Note (Signed)
The proper method of use, as well as anticipated side effects, of a metered-dose inhaler are discussed and demonstrated to the patient. Improved effectiveness after extensive coaching during this visit to a level of approximately  75% from a baseline of < 50%  She needs pfts next and stop smoking, the only established way to affect progression of dz

## 2014-04-29 NOTE — Assessment & Plan Note (Signed)
No macroscopic nodules on cxr in pt with RA and clinically severe copd so my inclination is to stage her copd first and then address how aggressive to be with the nodules

## 2014-05-07 DIAGNOSIS — M150 Primary generalized (osteo)arthritis: Secondary | ICD-10-CM | POA: Diagnosis not present

## 2014-05-07 DIAGNOSIS — M810 Age-related osteoporosis without current pathological fracture: Secondary | ICD-10-CM | POA: Diagnosis not present

## 2014-05-07 DIAGNOSIS — M0589 Other rheumatoid arthritis with rheumatoid factor of multiple sites: Secondary | ICD-10-CM | POA: Diagnosis not present

## 2014-05-07 DIAGNOSIS — M5030 Other cervical disc degeneration, unspecified cervical region: Secondary | ICD-10-CM | POA: Diagnosis not present

## 2014-05-31 DIAGNOSIS — M0589 Other rheumatoid arthritis with rheumatoid factor of multiple sites: Secondary | ICD-10-CM | POA: Diagnosis not present

## 2014-06-18 DIAGNOSIS — E559 Vitamin D deficiency, unspecified: Secondary | ICD-10-CM | POA: Diagnosis not present

## 2014-06-18 DIAGNOSIS — M0589 Other rheumatoid arthritis with rheumatoid factor of multiple sites: Secondary | ICD-10-CM | POA: Diagnosis not present

## 2014-06-20 DIAGNOSIS — E559 Vitamin D deficiency, unspecified: Secondary | ICD-10-CM | POA: Diagnosis not present

## 2014-07-05 DIAGNOSIS — R197 Diarrhea, unspecified: Secondary | ICD-10-CM | POA: Diagnosis not present

## 2014-07-05 DIAGNOSIS — R0600 Dyspnea, unspecified: Secondary | ICD-10-CM | POA: Diagnosis not present

## 2014-07-05 DIAGNOSIS — R634 Abnormal weight loss: Secondary | ICD-10-CM | POA: Diagnosis not present

## 2014-07-05 DIAGNOSIS — R0602 Shortness of breath: Secondary | ICD-10-CM | POA: Diagnosis not present

## 2014-07-05 DIAGNOSIS — R509 Fever, unspecified: Secondary | ICD-10-CM | POA: Diagnosis not present

## 2014-07-12 DIAGNOSIS — E559 Vitamin D deficiency, unspecified: Secondary | ICD-10-CM | POA: Diagnosis not present

## 2014-07-12 DIAGNOSIS — E785 Hyperlipidemia, unspecified: Secondary | ICD-10-CM | POA: Diagnosis not present

## 2014-07-12 DIAGNOSIS — J449 Chronic obstructive pulmonary disease, unspecified: Secondary | ICD-10-CM | POA: Diagnosis not present

## 2014-07-12 DIAGNOSIS — M0579 Rheumatoid arthritis with rheumatoid factor of multiple sites without organ or systems involvement: Secondary | ICD-10-CM | POA: Diagnosis not present

## 2014-07-13 ENCOUNTER — Other Ambulatory Visit: Payer: Self-pay | Admitting: Internal Medicine

## 2014-07-13 DIAGNOSIS — R1084 Generalized abdominal pain: Secondary | ICD-10-CM

## 2014-07-13 DIAGNOSIS — E538 Deficiency of other specified B group vitamins: Secondary | ICD-10-CM | POA: Diagnosis not present

## 2014-07-13 DIAGNOSIS — R634 Abnormal weight loss: Secondary | ICD-10-CM | POA: Diagnosis not present

## 2014-07-13 DIAGNOSIS — E559 Vitamin D deficiency, unspecified: Secondary | ICD-10-CM | POA: Diagnosis not present

## 2014-07-19 ENCOUNTER — Other Ambulatory Visit: Payer: Medicare Other

## 2014-07-19 DIAGNOSIS — M0589 Other rheumatoid arthritis with rheumatoid factor of multiple sites: Secondary | ICD-10-CM | POA: Diagnosis not present

## 2014-07-20 ENCOUNTER — Ambulatory Visit
Admission: RE | Admit: 2014-07-20 | Discharge: 2014-07-20 | Disposition: A | Discharge status: Home / Self Care | Payer: Medicare Other | Source: Ambulatory Visit | Attending: Internal Medicine | Admitting: Internal Medicine

## 2014-07-20 DIAGNOSIS — R1084 Generalized abdominal pain: Secondary | ICD-10-CM | POA: Diagnosis not present

## 2014-07-20 DIAGNOSIS — R634 Abnormal weight loss: Secondary | ICD-10-CM | POA: Diagnosis not present

## 2014-07-26 DIAGNOSIS — H5213 Myopia, bilateral: Secondary | ICD-10-CM | POA: Diagnosis not present

## 2014-07-26 DIAGNOSIS — Z961 Presence of intraocular lens: Secondary | ICD-10-CM | POA: Diagnosis not present

## 2014-07-26 DIAGNOSIS — Z135 Encounter for screening for eye and ear disorders: Secondary | ICD-10-CM | POA: Diagnosis not present

## 2014-07-26 DIAGNOSIS — H1859 Other hereditary corneal dystrophies: Secondary | ICD-10-CM | POA: Diagnosis not present

## 2014-07-26 DIAGNOSIS — H3531 Nonexudative age-related macular degeneration: Secondary | ICD-10-CM | POA: Diagnosis not present

## 2014-08-01 DIAGNOSIS — M5030 Other cervical disc degeneration, unspecified cervical region: Secondary | ICD-10-CM | POA: Diagnosis not present

## 2014-08-01 DIAGNOSIS — M150 Primary generalized (osteo)arthritis: Secondary | ICD-10-CM | POA: Diagnosis not present

## 2014-08-01 DIAGNOSIS — M810 Age-related osteoporosis without current pathological fracture: Secondary | ICD-10-CM | POA: Diagnosis not present

## 2014-08-01 DIAGNOSIS — M0589 Other rheumatoid arthritis with rheumatoid factor of multiple sites: Secondary | ICD-10-CM | POA: Diagnosis not present

## 2014-08-07 ENCOUNTER — Ambulatory Visit (INDEPENDENT_AMBULATORY_CARE_PROVIDER_SITE_OTHER): Payer: Medicare Other | Admitting: Internal Medicine

## 2014-08-07 ENCOUNTER — Encounter: Payer: Self-pay | Admitting: Internal Medicine

## 2014-08-07 VITALS — BP 114/72 | HR 74 | Ht 62.0 in | Wt 90.0 lb

## 2014-08-07 DIAGNOSIS — F17210 Nicotine dependence, cigarettes, uncomplicated: Secondary | ICD-10-CM

## 2014-08-07 DIAGNOSIS — Z720 Tobacco use: Secondary | ICD-10-CM

## 2014-08-07 DIAGNOSIS — J449 Chronic obstructive pulmonary disease, unspecified: Secondary | ICD-10-CM

## 2014-08-07 DIAGNOSIS — R918 Other nonspecific abnormal finding of lung field: Secondary | ICD-10-CM

## 2014-08-07 LAB — PULMONARY FUNCTION TEST
DL/VA % pred: 69 %
DL/VA: 3.16 ml/min/mmHg/L
DLCO unc % pred: 62 %
DLCO unc: 13.49 ml/min/mmHg
FEF 25-75 Post: 0.65 L/sec
FEF 25-75 Pre: 0.67 L/sec
FEF2575-%Change-Post: -2 %
FEF2575-%Pred-Post: 42 %
FEF2575-%Pred-Pre: 43 %
FEV1-%Change-Post: -3 %
FEV1-%Pred-Post: 71 %
FEV1-%Pred-Pre: 73 %
FEV1-Post: 1.37 L
FEV1-Pre: 1.41 L
FEV1FVC-%Change-Post: -1 %
FEV1FVC-%Pred-Pre: 83 %
FEV6-%Change-Post: 0 %
FEV6-%Pred-Post: 91 %
FEV6-%Pred-Pre: 91 %
FEV6-Post: 2.22 L
FEV6-Pre: 2.24 L
FEV6FVC-%Change-Post: 0 %
FEV6FVC-%Pred-Post: 105 %
FEV6FVC-%Pred-Pre: 104 %
FVC-%Change-Post: -1 %
FVC-%Pred-Post: 86 %
FVC-%Pred-Pre: 87 %
FVC-Post: 2.22 L
FVC-Pre: 2.26 L
Post FEV1/FVC ratio: 61 %
Post FEV6/FVC ratio: 100 %
Pre FEV1/FVC ratio: 63 %
Pre FEV6/FVC Ratio: 99 %

## 2014-08-07 NOTE — Progress Notes (Signed)
Subjective:    Patient ID: Olivia Hooper, female    DOB: 03-09-1941   MRN: [id number]    Brief patient profile:  13 yowf "quit smoking" in Oct 2015 referred 03/15/14 to pulmonary clinic by Olivia Hooper for eval of lung nodules with copd GOLD II 08/07/2014    Olivia Hooper is rheumatology  History of Present Illness  03/15/2014 1st [facility] office visit/ Olivia Hooper   Chief Complaint  Patient presents with  . Pulmonary Consult    Referred by Dr. Jani Hooper for eval of pulmonary nodules. Pt states that she had a chest chest done 4-5 yrs ago b/c she was having "alot of wheezing".  She states that she has cough and SOB "for a long time". She states she only gets SOB if she walks "a long distance" or walks up alot of steps. Her cough is mainly non prod. Sometimes produces some clear sputum in the am's.   indolent onset minimally progressive doe x 5 years x worse in hot or weather or inclines  can walk flat nl pace all day long with no resting symptoms Variably cough esp in am min clear mucus, not worse in am , never bloody  Has dulera and saba not using either.  rec Plan A = Automatic = dulera 100 Take 2 puffs first thing in am and then another 2 puffs about 12 hours later.  Plan B = backup  Only use your albuterol (proair) as a rescue medication Please remember to go to the  x-ray department downstairs for your tests - we will call you with the results when they are available. Ocean nasal spray    04/26/2014 f/u ov/Olivia Hooper re: clinically severe copd/ pfts pending/ on dulera 100 2bid and prn saba hfa Chief Complaint  Patient presents with  . Follow-up    Pt states that her cough and SOB have improved some since the last visit. She is still smoking "sometimes". She has not used rescue inhaler since her last visit.    Not limited by breathing from desired activities  Except for steps/ steep inclines rec Please remember to go to the  x-ray department downstairs for your tests - we will call you with the  results when they are available. The key is to stop smoking completely before smoking completely stops you! Work on inhaler technique:     08/07/2014 f/u ov/Olivia Hooper re: GOLD II copd dulera 100 2bid / still smoking  Chief Complaint  Patient presents with  . Follow-up    PFT done today.  Pt states that her breathing is doing well. Still smoking "some" and has not had to use rescue inhaler.   no change doe x hills/ steps/ nl pace = MMRC 1    No obvious day to day or daytime variabilty or assoc excess or purulent sputum production  or cp or chest tightness, subjective wheeze overt sinus or hb symptoms. No unusual exp hx or h/o childhood pna/ asthma or knowledge of premature birth.  Sleeping ok without nocturnal  or early am exacerbation  of respiratory  c/o's or need for noct saba. Also denies any obvious fluctuation of symptoms with weather or environmental changes or other aggravating or alleviating factors except as outlined above   Current Medications, Allergies, Complete Past Medical History, Past Surgical History, Family History, and Social History were reviewed in [employer] record.  ROS  The following are not active complaints unless bolded sore throat, dysphagia, dental problems, itching, sneezing,  nasal congestion  or excess/ purulent secretions, ear ache,   fever, chills, sweats, unintended wt loss, pleuritic or exertional cp, hemoptysis,  orthopnea pnd or leg swelling, presyncope, palpitations, heartburn, abdominal pain, anorexia, nausea, vomiting, diarrhea  or change in bowel or urinary habits, change in stools or urine, dysuria,hematuria,  rash, arthralgias, visual complaints, headache, numbness weakness or ataxia or problems with walking or coordination,  change in mood/affect or memory.                       Objective:   Physical Exam  Thin amb wf nad   Wt Readings from Last 3 Encounters:  08/07/14 90 lb (40.824 kg)  04/26/14 97 lb (43.999 kg)    03/15/14 94 lb 6.4 oz (42.82 kg)    Vital signs reviewed       HEENT mild turbinate edema.  Edentulous Oropharynx no thrush or excess pnd or cobblestoning.  No JVD or cervical adenopathy. Mild accessory muscle hypertrophy. Trachea midline, nl thryroid. Chest was hyperinflated by percussion with diminished breath sounds and moderate increased exp time with distant mid bilateralexp wheeze. Hoover sign positive at mid inspiration. Regular rate and rhythm without murmur gallop or rub or increase P2 or edema.  Abd: no hsm, nl excursion. Ext warm without cyanosis or clubbing.  MS pos for severe RA changes both hands, no obvious extensor nodules        I personally reviewed images and agree with radiology impression as follows:  CXR:  04/26/14 No active cardiopulmonary disease.        Assessment & Plan:

## 2014-08-07 NOTE — Patient Instructions (Addendum)
The key is to stop smoking completely before smoking completely stops you!     If you are satisfied with your treatment plan,  let your doctor know and he/she can either refill your medications or you can return here when your prescription runs out.     If in any way you are not 100% satisfied,  please tell us.  If 100% better, tell your friends!  Pulmonary follow up is as needed        

## 2014-08-07 NOTE — Progress Notes (Signed)
PFT performed today. 

## 2014-08-07 NOTE — Assessment & Plan Note (Addendum)
-   04/26/2014 p extensive coaching HFA effectiveness =    75%  - PFTs 08/07/2014   1.37 ( 71%) ratio 61 and no change p saba p am dulera and dlco 62%    The proper method of use, as well as anticipated side effects, of a metered-dose inhaler are discussed and demonstrated to the patient. Improved effectiveness after extensive coaching during this visit to a level of approximately  90% despite hand deformities from RA   I had an extended discussion with the patient reviewing all relevant studies completed to date and  lasting 15 to 20 minutes of a 25 minute visit on the following ongoing concerns:  1)  This is moderate copd.   I reviewed the Fletcher curve with the patient that basically indicates  if you quit smoking when your best day FEV1 is still well preserved (as is clearly  the case here)  it is highly unlikely you will progress to severe disease and informed the patient there was no medication on the market that has proven to alter the curve/ its downward trajectory  or the likelihood of progression of their disease.  Therefore stopping smoking and maintaining abstinence is the most important aspect of care, not choice of inhalers or for that matter, doctors.   2) for now dulera 100 being used because cough was one of her concerns and higher doses often exacerbate it but symbicort 80 or 160 are also options if there are formulary issues, reviewed  3) smoking discussed separately    4) no evidence RA lung dz  5) pulmonary f/u is prn

## 2014-08-08 NOTE — Assessment & Plan Note (Signed)
>   3 m  I took an extended  opportunity with this patient to outline the consequences of continued cigarette use  in airway disorders based on all the data we have from the multiple national lung health studies (perfomed over decades at millions of dollars in cost)  indicating that smoking cessation is by far the most impotant aspect of her care.    Agrees to try to stop all active smoking ( this is very minimal any so doubt nicotine replacement will do much here)

## 2014-08-17 DIAGNOSIS — R634 Abnormal weight loss: Secondary | ICD-10-CM | POA: Diagnosis not present

## 2014-08-17 DIAGNOSIS — R197 Diarrhea, unspecified: Secondary | ICD-10-CM | POA: Diagnosis not present

## 2014-08-22 DIAGNOSIS — M0589 Other rheumatoid arthritis with rheumatoid factor of multiple sites: Secondary | ICD-10-CM | POA: Diagnosis not present

## 2014-09-13 DIAGNOSIS — E559 Vitamin D deficiency, unspecified: Secondary | ICD-10-CM | POA: Diagnosis not present

## 2014-09-13 DIAGNOSIS — E785 Hyperlipidemia, unspecified: Secondary | ICD-10-CM | POA: Diagnosis not present

## 2014-09-13 DIAGNOSIS — M0589 Other rheumatoid arthritis with rheumatoid factor of multiple sites: Secondary | ICD-10-CM | POA: Diagnosis not present

## 2014-09-24 DIAGNOSIS — M0589 Other rheumatoid arthritis with rheumatoid factor of multiple sites: Secondary | ICD-10-CM | POA: Diagnosis not present

## 2014-10-03 DIAGNOSIS — H3531 Nonexudative age-related macular degeneration: Secondary | ICD-10-CM | POA: Diagnosis not present

## 2014-10-29 DIAGNOSIS — R5383 Other fatigue: Secondary | ICD-10-CM | POA: Diagnosis not present

## 2014-10-29 DIAGNOSIS — M069 Rheumatoid arthritis, unspecified: Secondary | ICD-10-CM | POA: Diagnosis not present

## 2014-10-29 DIAGNOSIS — M0589 Other rheumatoid arthritis with rheumatoid factor of multiple sites: Secondary | ICD-10-CM | POA: Diagnosis not present

## 2014-10-31 DIAGNOSIS — E559 Vitamin D deficiency, unspecified: Secondary | ICD-10-CM | POA: Diagnosis not present

## 2014-10-31 DIAGNOSIS — E785 Hyperlipidemia, unspecified: Secondary | ICD-10-CM | POA: Diagnosis not present

## 2014-10-31 DIAGNOSIS — M069 Rheumatoid arthritis, unspecified: Secondary | ICD-10-CM | POA: Diagnosis not present

## 2014-11-15 DIAGNOSIS — L219 Seborrheic dermatitis, unspecified: Secondary | ICD-10-CM | POA: Diagnosis not present

## 2014-11-15 DIAGNOSIS — L650 Telogen effluvium: Secondary | ICD-10-CM | POA: Diagnosis not present

## 2014-11-15 DIAGNOSIS — L708 Other acne: Secondary | ICD-10-CM | POA: Diagnosis not present

## 2014-11-22 DIAGNOSIS — M150 Primary generalized (osteo)arthritis: Secondary | ICD-10-CM | POA: Diagnosis not present

## 2014-11-22 DIAGNOSIS — M810 Age-related osteoporosis without current pathological fracture: Secondary | ICD-10-CM | POA: Diagnosis not present

## 2014-11-22 DIAGNOSIS — M5030 Other cervical disc degeneration, unspecified cervical region: Secondary | ICD-10-CM | POA: Diagnosis not present

## 2014-11-22 DIAGNOSIS — M0589 Other rheumatoid arthritis with rheumatoid factor of multiple sites: Secondary | ICD-10-CM | POA: Diagnosis not present

## 2014-11-30 DIAGNOSIS — H353130 Nonexudative age-related macular degeneration, bilateral, stage unspecified: Secondary | ICD-10-CM | POA: Insufficient documentation

## 2014-11-30 DIAGNOSIS — H18529 Epithelial (juvenile) corneal dystrophy, unspecified eye: Secondary | ICD-10-CM | POA: Insufficient documentation

## 2014-11-30 DIAGNOSIS — H3530 Unspecified macular degeneration: Secondary | ICD-10-CM | POA: Diagnosis not present

## 2014-11-30 DIAGNOSIS — H1852 Epithelial (juvenile) corneal dystrophy: Secondary | ICD-10-CM | POA: Diagnosis not present

## 2014-11-30 DIAGNOSIS — Z961 Presence of intraocular lens: Secondary | ICD-10-CM | POA: Diagnosis not present

## 2014-12-03 DIAGNOSIS — M0589 Other rheumatoid arthritis with rheumatoid factor of multiple sites: Secondary | ICD-10-CM | POA: Diagnosis not present

## 2014-12-18 DIAGNOSIS — E559 Vitamin D deficiency, unspecified: Secondary | ICD-10-CM | POA: Diagnosis not present

## 2014-12-18 DIAGNOSIS — E210 Primary hyperparathyroidism: Secondary | ICD-10-CM | POA: Diagnosis not present

## 2014-12-20 ENCOUNTER — Other Ambulatory Visit (HOSPITAL_COMMUNITY): Payer: Self-pay | Admitting: Endocrinology

## 2014-12-20 DIAGNOSIS — E213 Hyperparathyroidism, unspecified: Secondary | ICD-10-CM

## 2014-12-31 ENCOUNTER — Encounter (HOSPITAL_COMMUNITY)
Admission: RE | Admit: 2014-12-31 | Discharge: 2014-12-31 | Disposition: A | Discharge status: Home / Self Care | Payer: Medicare Other | Source: Ambulatory Visit | Attending: Endocrinology | Admitting: Endocrinology

## 2014-12-31 DIAGNOSIS — E213 Hyperparathyroidism, unspecified: Secondary | ICD-10-CM | POA: Insufficient documentation

## 2014-12-31 DIAGNOSIS — E0590 Thyrotoxicosis, unspecified without thyrotoxic crisis or storm: Secondary | ICD-10-CM | POA: Diagnosis not present

## 2014-12-31 MED ORDER — TECHNETIUM TC 99M SESTAMIBI - CARDIOLITE
25.2000 | Freq: Once | INTRAVENOUS | Status: AC | PRN
  Administered 2014-12-31: 25.2 via INTRAVENOUS

## 2015-01-08 DIAGNOSIS — M810 Age-related osteoporosis without current pathological fracture: Secondary | ICD-10-CM | POA: Diagnosis not present

## 2015-01-09 DIAGNOSIS — M0589 Other rheumatoid arthritis with rheumatoid factor of multiple sites: Secondary | ICD-10-CM | POA: Diagnosis not present

## 2015-01-24 DIAGNOSIS — H1852 Epithelial (juvenile) corneal dystrophy: Secondary | ICD-10-CM | POA: Diagnosis not present

## 2015-01-25 DIAGNOSIS — H18451 Nodular corneal degeneration, right eye: Secondary | ICD-10-CM | POA: Insufficient documentation

## 2015-01-25 DIAGNOSIS — Z9889 Other specified postprocedural states: Secondary | ICD-10-CM | POA: Insufficient documentation

## 2015-02-13 DIAGNOSIS — M0589 Other rheumatoid arthritis with rheumatoid factor of multiple sites: Secondary | ICD-10-CM | POA: Diagnosis not present

## 2015-02-21 DIAGNOSIS — M810 Age-related osteoporosis without current pathological fracture: Secondary | ICD-10-CM | POA: Diagnosis not present

## 2015-02-21 DIAGNOSIS — M150 Primary generalized (osteo)arthritis: Secondary | ICD-10-CM | POA: Diagnosis not present

## 2015-02-21 DIAGNOSIS — M0589 Other rheumatoid arthritis with rheumatoid factor of multiple sites: Secondary | ICD-10-CM | POA: Diagnosis not present

## 2015-02-21 DIAGNOSIS — M5030 Other cervical disc degeneration, unspecified cervical region: Secondary | ICD-10-CM | POA: Diagnosis not present

## 2015-03-14 DIAGNOSIS — L650 Telogen effluvium: Secondary | ICD-10-CM | POA: Diagnosis not present

## 2015-03-14 DIAGNOSIS — L218 Other seborrheic dermatitis: Secondary | ICD-10-CM | POA: Diagnosis not present

## 2015-03-20 DIAGNOSIS — M0589 Other rheumatoid arthritis with rheumatoid factor of multiple sites: Secondary | ICD-10-CM | POA: Diagnosis not present

## 2015-03-20 DIAGNOSIS — E559 Vitamin D deficiency, unspecified: Secondary | ICD-10-CM | POA: Diagnosis not present

## 2015-03-20 DIAGNOSIS — M810 Age-related osteoporosis without current pathological fracture: Secondary | ICD-10-CM | POA: Diagnosis not present

## 2015-03-20 DIAGNOSIS — E785 Hyperlipidemia, unspecified: Secondary | ICD-10-CM | POA: Diagnosis not present

## 2015-03-20 DIAGNOSIS — Z0000 Encounter for general adult medical examination without abnormal findings: Secondary | ICD-10-CM | POA: Diagnosis not present

## 2015-04-24 DIAGNOSIS — M0589 Other rheumatoid arthritis with rheumatoid factor of multiple sites: Secondary | ICD-10-CM | POA: Diagnosis not present

## 2015-05-20 DIAGNOSIS — Z13220 Encounter for screening for lipoid disorders: Secondary | ICD-10-CM | POA: Diagnosis not present

## 2015-05-20 DIAGNOSIS — E213 Hyperparathyroidism, unspecified: Secondary | ICD-10-CM | POA: Diagnosis not present

## 2015-05-20 DIAGNOSIS — Z0000 Encounter for general adult medical examination without abnormal findings: Secondary | ICD-10-CM | POA: Diagnosis not present

## 2015-05-20 DIAGNOSIS — M069 Rheumatoid arthritis, unspecified: Secondary | ICD-10-CM | POA: Diagnosis not present

## 2015-05-20 DIAGNOSIS — E785 Hyperlipidemia, unspecified: Secondary | ICD-10-CM | POA: Diagnosis not present

## 2015-05-20 DIAGNOSIS — E559 Vitamin D deficiency, unspecified: Secondary | ICD-10-CM | POA: Diagnosis not present

## 2015-05-22 DIAGNOSIS — M0589 Other rheumatoid arthritis with rheumatoid factor of multiple sites: Secondary | ICD-10-CM | POA: Diagnosis not present

## 2015-05-22 DIAGNOSIS — M150 Primary generalized (osteo)arthritis: Secondary | ICD-10-CM | POA: Diagnosis not present

## 2015-05-22 DIAGNOSIS — M810 Age-related osteoporosis without current pathological fracture: Secondary | ICD-10-CM | POA: Diagnosis not present

## 2015-05-22 DIAGNOSIS — M5030 Other cervical disc degeneration, unspecified cervical region: Secondary | ICD-10-CM | POA: Diagnosis not present

## 2015-05-22 DIAGNOSIS — M5032 Other cervical disc degeneration, mid-cervical region, unspecified level: Secondary | ICD-10-CM | POA: Diagnosis not present

## 2015-05-23 DIAGNOSIS — J449 Chronic obstructive pulmonary disease, unspecified: Secondary | ICD-10-CM | POA: Diagnosis not present

## 2015-05-23 DIAGNOSIS — M069 Rheumatoid arthritis, unspecified: Secondary | ICD-10-CM | POA: Diagnosis not present

## 2015-05-23 DIAGNOSIS — Z720 Tobacco use: Secondary | ICD-10-CM | POA: Diagnosis not present

## 2015-05-23 DIAGNOSIS — E559 Vitamin D deficiency, unspecified: Secondary | ICD-10-CM | POA: Diagnosis not present

## 2015-05-28 DIAGNOSIS — M0589 Other rheumatoid arthritis with rheumatoid factor of multiple sites: Secondary | ICD-10-CM | POA: Diagnosis not present

## 2015-06-18 DIAGNOSIS — E210 Primary hyperparathyroidism: Secondary | ICD-10-CM | POA: Diagnosis not present

## 2015-06-18 DIAGNOSIS — R899 Unspecified abnormal finding in specimens from other organs, systems and tissues: Secondary | ICD-10-CM | POA: Diagnosis not present

## 2015-06-18 DIAGNOSIS — J449 Chronic obstructive pulmonary disease, unspecified: Secondary | ICD-10-CM | POA: Diagnosis not present

## 2015-06-18 DIAGNOSIS — E789 Disorder of lipoprotein metabolism, unspecified: Secondary | ICD-10-CM | POA: Diagnosis not present

## 2015-07-02 DIAGNOSIS — M0589 Other rheumatoid arthritis with rheumatoid factor of multiple sites: Secondary | ICD-10-CM | POA: Diagnosis not present

## 2015-08-06 DIAGNOSIS — M0589 Other rheumatoid arthritis with rheumatoid factor of multiple sites: Secondary | ICD-10-CM | POA: Diagnosis not present

## 2015-08-20 ENCOUNTER — Other Ambulatory Visit: Payer: Self-pay | Admitting: Internal Medicine

## 2015-08-20 DIAGNOSIS — R109 Unspecified abdominal pain: Secondary | ICD-10-CM | POA: Diagnosis not present

## 2015-08-22 DIAGNOSIS — M810 Age-related osteoporosis without current pathological fracture: Secondary | ICD-10-CM | POA: Diagnosis not present

## 2015-08-22 DIAGNOSIS — M150 Primary generalized (osteo)arthritis: Secondary | ICD-10-CM | POA: Diagnosis not present

## 2015-08-22 DIAGNOSIS — M5030 Other cervical disc degeneration, unspecified cervical region: Secondary | ICD-10-CM | POA: Diagnosis not present

## 2015-08-22 DIAGNOSIS — M0589 Other rheumatoid arthritis with rheumatoid factor of multiple sites: Secondary | ICD-10-CM | POA: Diagnosis not present

## 2015-08-29 ENCOUNTER — Ambulatory Visit
Admission: RE | Admit: 2015-08-29 | Discharge: 2015-08-29 | Disposition: A | Discharge status: Home / Self Care | Payer: Medicare Other | Source: Ambulatory Visit | Attending: Internal Medicine | Admitting: Internal Medicine

## 2015-08-29 DIAGNOSIS — R1030 Lower abdominal pain, unspecified: Secondary | ICD-10-CM | POA: Diagnosis not present

## 2015-08-29 DIAGNOSIS — R109 Unspecified abdominal pain: Secondary | ICD-10-CM

## 2015-08-29 MED ORDER — IOPAMIDOL (ISOVUE-300) INJECTION 61%
100.0000 mL | Freq: Once | INTRAVENOUS | Status: AC | PRN
  Administered 2015-08-29: 90 mL via INTRAVENOUS

## 2015-08-30 DIAGNOSIS — R52 Pain, unspecified: Secondary | ICD-10-CM | POA: Diagnosis not present

## 2015-08-30 DIAGNOSIS — M05741 Rheumatoid arthritis with rheumatoid factor of right hand without organ or systems involvement: Secondary | ICD-10-CM | POA: Diagnosis not present

## 2015-08-30 DIAGNOSIS — M05742 Rheumatoid arthritis with rheumatoid factor of left hand without organ or systems involvement: Secondary | ICD-10-CM | POA: Diagnosis not present

## 2015-08-30 DIAGNOSIS — G5603 Carpal tunnel syndrome, bilateral upper limbs: Secondary | ICD-10-CM | POA: Diagnosis not present

## 2015-09-11 DIAGNOSIS — M069 Rheumatoid arthritis, unspecified: Secondary | ICD-10-CM | POA: Diagnosis not present

## 2015-09-11 DIAGNOSIS — M0589 Other rheumatoid arthritis with rheumatoid factor of multiple sites: Secondary | ICD-10-CM | POA: Diagnosis not present

## 2015-09-12 DIAGNOSIS — Z961 Presence of intraocular lens: Secondary | ICD-10-CM | POA: Diagnosis not present

## 2015-09-12 DIAGNOSIS — H353131 Nonexudative age-related macular degeneration, bilateral, early dry stage: Secondary | ICD-10-CM | POA: Diagnosis not present

## 2015-09-12 DIAGNOSIS — H1859 Other hereditary corneal dystrophies: Secondary | ICD-10-CM | POA: Diagnosis not present

## 2015-09-12 DIAGNOSIS — Z135 Encounter for screening for eye and ear disorders: Secondary | ICD-10-CM | POA: Diagnosis not present

## 2015-09-18 DIAGNOSIS — M05741 Rheumatoid arthritis with rheumatoid factor of right hand without organ or systems involvement: Secondary | ICD-10-CM | POA: Diagnosis not present

## 2015-09-18 DIAGNOSIS — G5603 Carpal tunnel syndrome, bilateral upper limbs: Secondary | ICD-10-CM | POA: Diagnosis not present

## 2015-09-18 DIAGNOSIS — M05742 Rheumatoid arthritis with rheumatoid factor of left hand without organ or systems involvement: Secondary | ICD-10-CM | POA: Diagnosis not present

## 2015-09-25 DIAGNOSIS — M0589 Other rheumatoid arthritis with rheumatoid factor of multiple sites: Secondary | ICD-10-CM | POA: Diagnosis not present

## 2015-10-10 DIAGNOSIS — E213 Hyperparathyroidism, unspecified: Secondary | ICD-10-CM | POA: Diagnosis not present

## 2015-10-10 DIAGNOSIS — E785 Hyperlipidemia, unspecified: Secondary | ICD-10-CM | POA: Diagnosis not present

## 2015-10-10 DIAGNOSIS — J449 Chronic obstructive pulmonary disease, unspecified: Secondary | ICD-10-CM | POA: Diagnosis not present

## 2015-10-16 DIAGNOSIS — M05742 Rheumatoid arthritis with rheumatoid factor of left hand without organ or systems involvement: Secondary | ICD-10-CM | POA: Diagnosis not present

## 2015-10-16 DIAGNOSIS — M05741 Rheumatoid arthritis with rheumatoid factor of right hand without organ or systems involvement: Secondary | ICD-10-CM | POA: Diagnosis not present

## 2015-10-17 ENCOUNTER — Other Ambulatory Visit (HOSPITAL_COMMUNITY): Payer: Self-pay | Admitting: Endocrinology

## 2015-10-17 DIAGNOSIS — E789 Disorder of lipoprotein metabolism, unspecified: Secondary | ICD-10-CM | POA: Diagnosis not present

## 2015-10-17 DIAGNOSIS — E213 Hyperparathyroidism, unspecified: Secondary | ICD-10-CM

## 2015-10-17 DIAGNOSIS — E210 Primary hyperparathyroidism: Secondary | ICD-10-CM | POA: Diagnosis not present

## 2015-10-17 DIAGNOSIS — F17200 Nicotine dependence, unspecified, uncomplicated: Secondary | ICD-10-CM | POA: Diagnosis not present

## 2015-10-23 ENCOUNTER — Encounter (HOSPITAL_COMMUNITY): Payer: Medicare Other

## 2015-10-23 DIAGNOSIS — M0589 Other rheumatoid arthritis with rheumatoid factor of multiple sites: Secondary | ICD-10-CM | POA: Diagnosis not present

## 2015-10-24 ENCOUNTER — Encounter (HOSPITAL_COMMUNITY)
Admission: RE | Admit: 2015-10-24 | Discharge: 2015-10-24 | Disposition: A | Discharge status: Home / Self Care | Payer: Medicare Other | Source: Ambulatory Visit | Attending: Endocrinology | Admitting: Endocrinology

## 2015-10-24 DIAGNOSIS — E213 Hyperparathyroidism, unspecified: Secondary | ICD-10-CM | POA: Diagnosis not present

## 2015-10-24 DIAGNOSIS — R946 Abnormal results of thyroid function studies: Secondary | ICD-10-CM | POA: Diagnosis not present

## 2015-10-24 MED ORDER — TECHNETIUM TC 99M SESTAMIBI GENERIC - CARDIOLITE: 26.8000 | Freq: Once | INTRAVENOUS | Status: DC | PRN

## 2015-10-31 DIAGNOSIS — E210 Primary hyperparathyroidism: Secondary | ICD-10-CM | POA: Diagnosis not present

## 2015-10-31 DIAGNOSIS — M06049 Rheumatoid arthritis without rheumatoid factor, unspecified hand: Secondary | ICD-10-CM | POA: Diagnosis not present

## 2015-10-31 DIAGNOSIS — J449 Chronic obstructive pulmonary disease, unspecified: Secondary | ICD-10-CM | POA: Diagnosis not present

## 2015-10-31 DIAGNOSIS — E785 Hyperlipidemia, unspecified: Secondary | ICD-10-CM | POA: Diagnosis not present

## 2015-11-01 ENCOUNTER — Other Ambulatory Visit: Payer: Self-pay | Admitting: General Surgery

## 2015-11-01 DIAGNOSIS — E210 Primary hyperparathyroidism: Secondary | ICD-10-CM

## 2015-11-05 ENCOUNTER — Other Ambulatory Visit: Payer: Self-pay

## 2015-11-15 ENCOUNTER — Ambulatory Visit
Admission: RE | Admit: 2015-11-15 | Discharge: 2015-11-15 | Disposition: A | Discharge status: Home / Self Care | Payer: Medicare Other | Source: Ambulatory Visit | Attending: General Surgery | Admitting: General Surgery

## 2015-11-15 DIAGNOSIS — E213 Hyperparathyroidism, unspecified: Secondary | ICD-10-CM | POA: Diagnosis not present

## 2015-11-15 DIAGNOSIS — E210 Primary hyperparathyroidism: Secondary | ICD-10-CM

## 2015-11-15 MED ORDER — GADOBENATE DIMEGLUMINE 529 MG/ML IV SOLN
10.0000 mL | Freq: Once | INTRAVENOUS | Status: AC | PRN
  Administered 2015-11-15: 10 mL via INTRAVENOUS

## 2015-11-21 DIAGNOSIS — M810 Age-related osteoporosis without current pathological fracture: Secondary | ICD-10-CM | POA: Diagnosis not present

## 2015-11-21 DIAGNOSIS — M150 Primary generalized (osteo)arthritis: Secondary | ICD-10-CM | POA: Diagnosis not present

## 2015-11-21 DIAGNOSIS — M0589 Other rheumatoid arthritis with rheumatoid factor of multiple sites: Secondary | ICD-10-CM | POA: Diagnosis not present

## 2015-11-21 DIAGNOSIS — M5030 Other cervical disc degeneration, unspecified cervical region: Secondary | ICD-10-CM | POA: Diagnosis not present

## 2015-11-26 DIAGNOSIS — M06049 Rheumatoid arthritis without rheumatoid factor, unspecified hand: Secondary | ICD-10-CM | POA: Diagnosis not present

## 2015-11-26 DIAGNOSIS — J449 Chronic obstructive pulmonary disease, unspecified: Secondary | ICD-10-CM | POA: Diagnosis not present

## 2015-11-26 DIAGNOSIS — E210 Primary hyperparathyroidism: Secondary | ICD-10-CM | POA: Diagnosis not present

## 2015-11-26 DIAGNOSIS — E785 Hyperlipidemia, unspecified: Secondary | ICD-10-CM | POA: Diagnosis not present

## 2015-12-24 DIAGNOSIS — R5383 Other fatigue: Secondary | ICD-10-CM | POA: Diagnosis not present

## 2015-12-24 DIAGNOSIS — M0589 Other rheumatoid arthritis with rheumatoid factor of multiple sites: Secondary | ICD-10-CM | POA: Diagnosis not present

## 2015-12-24 DIAGNOSIS — M810 Age-related osteoporosis without current pathological fracture: Secondary | ICD-10-CM | POA: Diagnosis not present

## 2015-12-24 DIAGNOSIS — M5030 Other cervical disc degeneration, unspecified cervical region: Secondary | ICD-10-CM | POA: Diagnosis not present

## 2015-12-24 DIAGNOSIS — M069 Rheumatoid arthritis, unspecified: Secondary | ICD-10-CM | POA: Diagnosis not present

## 2015-12-24 DIAGNOSIS — M150 Primary generalized (osteo)arthritis: Secondary | ICD-10-CM | POA: Diagnosis not present

## 2016-01-02 DIAGNOSIS — E210 Primary hyperparathyroidism: Secondary | ICD-10-CM | POA: Diagnosis not present

## 2016-01-02 DIAGNOSIS — M069 Rheumatoid arthritis, unspecified: Secondary | ICD-10-CM | POA: Diagnosis not present

## 2016-01-02 DIAGNOSIS — E789 Disorder of lipoprotein metabolism, unspecified: Secondary | ICD-10-CM | POA: Diagnosis not present

## 2016-01-21 DIAGNOSIS — M0589 Other rheumatoid arthritis with rheumatoid factor of multiple sites: Secondary | ICD-10-CM | POA: Diagnosis not present

## 2016-03-17 DIAGNOSIS — M069 Rheumatoid arthritis, unspecified: Secondary | ICD-10-CM | POA: Diagnosis not present

## 2016-03-17 DIAGNOSIS — M0589 Other rheumatoid arthritis with rheumatoid factor of multiple sites: Secondary | ICD-10-CM | POA: Diagnosis not present

## 2016-04-14 DIAGNOSIS — M0589 Other rheumatoid arthritis with rheumatoid factor of multiple sites: Secondary | ICD-10-CM | POA: Diagnosis not present

## 2016-04-27 DIAGNOSIS — M5030 Other cervical disc degeneration, unspecified cervical region: Secondary | ICD-10-CM | POA: Diagnosis not present

## 2016-04-27 DIAGNOSIS — M0589 Other rheumatoid arthritis with rheumatoid factor of multiple sites: Secondary | ICD-10-CM | POA: Diagnosis not present

## 2016-04-27 DIAGNOSIS — M810 Age-related osteoporosis without current pathological fracture: Secondary | ICD-10-CM | POA: Diagnosis not present

## 2016-04-27 DIAGNOSIS — M150 Primary generalized (osteo)arthritis: Secondary | ICD-10-CM | POA: Diagnosis not present

## 2016-05-06 DIAGNOSIS — Z79899 Other long term (current) drug therapy: Secondary | ICD-10-CM | POA: Diagnosis not present

## 2016-05-06 DIAGNOSIS — E785 Hyperlipidemia, unspecified: Secondary | ICD-10-CM | POA: Diagnosis not present

## 2016-05-06 DIAGNOSIS — E559 Vitamin D deficiency, unspecified: Secondary | ICD-10-CM | POA: Diagnosis not present

## 2016-05-12 DIAGNOSIS — M0589 Other rheumatoid arthritis with rheumatoid factor of multiple sites: Secondary | ICD-10-CM | POA: Diagnosis not present

## 2016-05-13 DIAGNOSIS — J42 Unspecified chronic bronchitis: Secondary | ICD-10-CM | POA: Diagnosis not present

## 2016-05-13 DIAGNOSIS — E559 Vitamin D deficiency, unspecified: Secondary | ICD-10-CM | POA: Diagnosis not present

## 2016-05-13 DIAGNOSIS — E789 Disorder of lipoprotein metabolism, unspecified: Secondary | ICD-10-CM | POA: Diagnosis not present

## 2016-05-13 DIAGNOSIS — M810 Age-related osteoporosis without current pathological fracture: Secondary | ICD-10-CM | POA: Diagnosis not present

## 2016-05-22 DIAGNOSIS — Z0000 Encounter for general adult medical examination without abnormal findings: Secondary | ICD-10-CM | POA: Diagnosis not present

## 2016-05-29 DIAGNOSIS — E213 Hyperparathyroidism, unspecified: Secondary | ICD-10-CM | POA: Diagnosis not present

## 2016-05-29 DIAGNOSIS — E7800 Pure hypercholesterolemia, unspecified: Secondary | ICD-10-CM | POA: Diagnosis not present

## 2016-06-11 DIAGNOSIS — M0589 Other rheumatoid arthritis with rheumatoid factor of multiple sites: Secondary | ICD-10-CM | POA: Diagnosis not present

## 2016-06-11 DIAGNOSIS — M069 Rheumatoid arthritis, unspecified: Secondary | ICD-10-CM | POA: Diagnosis not present

## 2016-06-11 DIAGNOSIS — M19011 Primary osteoarthritis, right shoulder: Secondary | ICD-10-CM | POA: Diagnosis not present

## 2016-06-11 DIAGNOSIS — M19012 Primary osteoarthritis, left shoulder: Secondary | ICD-10-CM | POA: Diagnosis not present

## 2016-06-11 DIAGNOSIS — M25519 Pain in unspecified shoulder: Secondary | ICD-10-CM | POA: Diagnosis not present

## 2016-06-18 DIAGNOSIS — Z961 Presence of intraocular lens: Secondary | ICD-10-CM | POA: Diagnosis not present

## 2016-06-18 DIAGNOSIS — H1859 Other hereditary corneal dystrophies: Secondary | ICD-10-CM | POA: Diagnosis not present

## 2016-06-18 DIAGNOSIS — H52203 Unspecified astigmatism, bilateral: Secondary | ICD-10-CM | POA: Diagnosis not present

## 2016-06-18 DIAGNOSIS — Z135 Encounter for screening for eye and ear disorders: Secondary | ICD-10-CM | POA: Diagnosis not present

## 2016-06-25 DIAGNOSIS — M5030 Other cervical disc degeneration, unspecified cervical region: Secondary | ICD-10-CM | POA: Diagnosis not present

## 2016-06-25 DIAGNOSIS — M0589 Other rheumatoid arthritis with rheumatoid factor of multiple sites: Secondary | ICD-10-CM | POA: Diagnosis not present

## 2016-06-25 DIAGNOSIS — M150 Primary generalized (osteo)arthritis: Secondary | ICD-10-CM | POA: Diagnosis not present

## 2016-06-25 DIAGNOSIS — M810 Age-related osteoporosis without current pathological fracture: Secondary | ICD-10-CM | POA: Diagnosis not present

## 2016-07-02 DIAGNOSIS — M810 Age-related osteoporosis without current pathological fracture: Secondary | ICD-10-CM | POA: Diagnosis not present

## 2016-07-02 DIAGNOSIS — M069 Rheumatoid arthritis, unspecified: Secondary | ICD-10-CM | POA: Diagnosis not present

## 2016-07-02 DIAGNOSIS — R002 Palpitations: Secondary | ICD-10-CM | POA: Diagnosis not present

## 2016-07-06 DIAGNOSIS — F17200 Nicotine dependence, unspecified, uncomplicated: Secondary | ICD-10-CM | POA: Diagnosis not present

## 2016-07-06 DIAGNOSIS — E785 Hyperlipidemia, unspecified: Secondary | ICD-10-CM | POA: Diagnosis not present

## 2016-07-06 DIAGNOSIS — R002 Palpitations: Secondary | ICD-10-CM | POA: Diagnosis not present

## 2016-07-07 DIAGNOSIS — E213 Hyperparathyroidism, unspecified: Secondary | ICD-10-CM | POA: Diagnosis not present

## 2016-07-09 DIAGNOSIS — M0589 Other rheumatoid arthritis with rheumatoid factor of multiple sites: Secondary | ICD-10-CM | POA: Diagnosis not present

## 2016-07-14 DIAGNOSIS — E789 Disorder of lipoprotein metabolism, unspecified: Secondary | ICD-10-CM | POA: Diagnosis not present

## 2016-07-14 DIAGNOSIS — I4891 Unspecified atrial fibrillation: Secondary | ICD-10-CM | POA: Diagnosis not present

## 2016-07-14 DIAGNOSIS — E0789 Other specified disorders of thyroid: Secondary | ICD-10-CM | POA: Diagnosis not present

## 2016-07-23 DIAGNOSIS — E785 Hyperlipidemia, unspecified: Secondary | ICD-10-CM | POA: Diagnosis not present

## 2016-07-23 DIAGNOSIS — E213 Hyperparathyroidism, unspecified: Secondary | ICD-10-CM | POA: Diagnosis not present

## 2016-07-23 DIAGNOSIS — M810 Age-related osteoporosis without current pathological fracture: Secondary | ICD-10-CM | POA: Diagnosis not present

## 2016-07-27 DIAGNOSIS — M150 Primary generalized (osteo)arthritis: Secondary | ICD-10-CM | POA: Diagnosis not present

## 2016-07-27 DIAGNOSIS — M5030 Other cervical disc degeneration, unspecified cervical region: Secondary | ICD-10-CM | POA: Diagnosis not present

## 2016-07-27 DIAGNOSIS — M542 Cervicalgia: Secondary | ICD-10-CM | POA: Diagnosis not present

## 2016-07-27 DIAGNOSIS — M0589 Other rheumatoid arthritis with rheumatoid factor of multiple sites: Secondary | ICD-10-CM | POA: Diagnosis not present

## 2016-07-27 DIAGNOSIS — M810 Age-related osteoporosis without current pathological fracture: Secondary | ICD-10-CM | POA: Diagnosis not present

## 2016-07-27 DIAGNOSIS — Z79899 Other long term (current) drug therapy: Secondary | ICD-10-CM | POA: Diagnosis not present

## 2016-07-30 DIAGNOSIS — F17210 Nicotine dependence, cigarettes, uncomplicated: Secondary | ICD-10-CM | POA: Diagnosis not present

## 2016-07-30 DIAGNOSIS — M069 Rheumatoid arthritis, unspecified: Secondary | ICD-10-CM | POA: Diagnosis not present

## 2016-07-30 DIAGNOSIS — R002 Palpitations: Secondary | ICD-10-CM | POA: Diagnosis not present

## 2016-07-30 DIAGNOSIS — Z79899 Other long term (current) drug therapy: Secondary | ICD-10-CM | POA: Diagnosis not present

## 2016-08-04 DIAGNOSIS — R002 Palpitations: Secondary | ICD-10-CM | POA: Diagnosis not present

## 2016-08-06 DIAGNOSIS — M0589 Other rheumatoid arthritis with rheumatoid factor of multiple sites: Secondary | ICD-10-CM | POA: Diagnosis not present

## 2016-08-12 DIAGNOSIS — M25462 Effusion, left knee: Secondary | ICD-10-CM | POA: Diagnosis not present

## 2016-08-12 DIAGNOSIS — M25562 Pain in left knee: Secondary | ICD-10-CM | POA: Diagnosis not present

## 2016-08-12 DIAGNOSIS — M25579 Pain in unspecified ankle and joints of unspecified foot: Secondary | ICD-10-CM | POA: Diagnosis not present

## 2016-08-12 DIAGNOSIS — S99912A Unspecified injury of left ankle, initial encounter: Secondary | ICD-10-CM | POA: Diagnosis not present

## 2016-08-18 ENCOUNTER — Other Ambulatory Visit: Payer: Self-pay | Admitting: Internal Medicine

## 2016-08-18 DIAGNOSIS — M25562 Pain in left knee: Secondary | ICD-10-CM

## 2016-08-25 DIAGNOSIS — E785 Hyperlipidemia, unspecified: Secondary | ICD-10-CM | POA: Diagnosis not present

## 2016-08-25 DIAGNOSIS — F17200 Nicotine dependence, unspecified, uncomplicated: Secondary | ICD-10-CM | POA: Diagnosis not present

## 2016-08-25 DIAGNOSIS — R002 Palpitations: Secondary | ICD-10-CM | POA: Diagnosis not present

## 2016-08-27 ENCOUNTER — Ambulatory Visit
Admission: RE | Admit: 2016-08-27 | Discharge: 2016-08-27 | Disposition: A | Discharge status: Home / Self Care | Payer: Medicare Other | Source: Ambulatory Visit | Attending: Internal Medicine | Admitting: Internal Medicine

## 2016-08-27 DIAGNOSIS — M25562 Pain in left knee: Secondary | ICD-10-CM | POA: Diagnosis not present

## 2016-09-03 DIAGNOSIS — M0589 Other rheumatoid arthritis with rheumatoid factor of multiple sites: Secondary | ICD-10-CM | POA: Diagnosis not present

## 2016-10-27 DIAGNOSIS — E213 Hyperparathyroidism, unspecified: Secondary | ICD-10-CM | POA: Diagnosis not present

## 2016-10-27 DIAGNOSIS — E785 Hyperlipidemia, unspecified: Secondary | ICD-10-CM | POA: Diagnosis not present

## 2016-10-27 DIAGNOSIS — M5030 Other cervical disc degeneration, unspecified cervical region: Secondary | ICD-10-CM | POA: Diagnosis not present

## 2016-10-27 DIAGNOSIS — Z5181 Encounter for therapeutic drug level monitoring: Secondary | ICD-10-CM | POA: Diagnosis not present

## 2016-10-27 DIAGNOSIS — Z79899 Other long term (current) drug therapy: Secondary | ICD-10-CM | POA: Diagnosis not present

## 2016-10-27 DIAGNOSIS — M810 Age-related osteoporosis without current pathological fracture: Secondary | ICD-10-CM | POA: Diagnosis not present

## 2016-10-27 DIAGNOSIS — M150 Primary generalized (osteo)arthritis: Secondary | ICD-10-CM | POA: Diagnosis not present

## 2016-10-27 DIAGNOSIS — M542 Cervicalgia: Secondary | ICD-10-CM | POA: Diagnosis not present

## 2016-10-27 DIAGNOSIS — M0589 Other rheumatoid arthritis with rheumatoid factor of multiple sites: Secondary | ICD-10-CM | POA: Diagnosis not present

## 2016-10-27 DIAGNOSIS — M069 Rheumatoid arthritis, unspecified: Secondary | ICD-10-CM | POA: Diagnosis not present

## 2016-10-29 DIAGNOSIS — M0589 Other rheumatoid arthritis with rheumatoid factor of multiple sites: Secondary | ICD-10-CM | POA: Diagnosis not present

## 2016-11-02 DIAGNOSIS — E785 Hyperlipidemia, unspecified: Secondary | ICD-10-CM | POA: Diagnosis not present

## 2016-11-02 DIAGNOSIS — M069 Rheumatoid arthritis, unspecified: Secondary | ICD-10-CM | POA: Diagnosis not present

## 2016-12-24 DIAGNOSIS — M0589 Other rheumatoid arthritis with rheumatoid factor of multiple sites: Secondary | ICD-10-CM | POA: Diagnosis not present

## 2017-02-01 DIAGNOSIS — M150 Primary generalized (osteo)arthritis: Secondary | ICD-10-CM | POA: Diagnosis not present

## 2017-02-01 DIAGNOSIS — Z79899 Other long term (current) drug therapy: Secondary | ICD-10-CM | POA: Diagnosis not present

## 2017-02-01 DIAGNOSIS — M810 Age-related osteoporosis without current pathological fracture: Secondary | ICD-10-CM | POA: Diagnosis not present

## 2017-02-01 DIAGNOSIS — M5030 Other cervical disc degeneration, unspecified cervical region: Secondary | ICD-10-CM | POA: Diagnosis not present

## 2017-02-01 DIAGNOSIS — M0589 Other rheumatoid arthritis with rheumatoid factor of multiple sites: Secondary | ICD-10-CM | POA: Diagnosis not present

## 2017-02-01 DIAGNOSIS — M542 Cervicalgia: Secondary | ICD-10-CM | POA: Diagnosis not present

## 2017-02-03 DIAGNOSIS — M810 Age-related osteoporosis without current pathological fracture: Secondary | ICD-10-CM | POA: Diagnosis not present

## 2017-02-24 DIAGNOSIS — Z79899 Other long term (current) drug therapy: Secondary | ICD-10-CM | POA: Diagnosis not present

## 2017-02-24 DIAGNOSIS — M0589 Other rheumatoid arthritis with rheumatoid factor of multiple sites: Secondary | ICD-10-CM | POA: Diagnosis not present

## 2017-03-18 DIAGNOSIS — M25511 Pain in right shoulder: Secondary | ICD-10-CM | POA: Diagnosis not present

## 2017-03-18 DIAGNOSIS — M810 Age-related osteoporosis without current pathological fracture: Secondary | ICD-10-CM | POA: Diagnosis not present

## 2017-03-18 DIAGNOSIS — M0589 Other rheumatoid arthritis with rheumatoid factor of multiple sites: Secondary | ICD-10-CM | POA: Diagnosis not present

## 2017-03-18 DIAGNOSIS — M150 Primary generalized (osteo)arthritis: Secondary | ICD-10-CM | POA: Diagnosis not present

## 2017-03-18 DIAGNOSIS — M25519 Pain in unspecified shoulder: Secondary | ICD-10-CM | POA: Diagnosis not present

## 2017-03-18 DIAGNOSIS — M25512 Pain in left shoulder: Secondary | ICD-10-CM | POA: Diagnosis not present

## 2017-03-18 DIAGNOSIS — M542 Cervicalgia: Secondary | ICD-10-CM | POA: Diagnosis not present

## 2017-03-18 DIAGNOSIS — M5030 Other cervical disc degeneration, unspecified cervical region: Secondary | ICD-10-CM | POA: Diagnosis not present

## 2017-03-18 DIAGNOSIS — Z79899 Other long term (current) drug therapy: Secondary | ICD-10-CM | POA: Diagnosis not present

## 2017-04-15 DIAGNOSIS — E785 Hyperlipidemia, unspecified: Secondary | ICD-10-CM | POA: Diagnosis not present

## 2017-04-15 DIAGNOSIS — E559 Vitamin D deficiency, unspecified: Secondary | ICD-10-CM | POA: Diagnosis not present

## 2017-04-15 DIAGNOSIS — M069 Rheumatoid arthritis, unspecified: Secondary | ICD-10-CM | POA: Diagnosis not present

## 2017-04-20 DIAGNOSIS — E209 Hypoparathyroidism, unspecified: Secondary | ICD-10-CM | POA: Diagnosis not present

## 2017-04-20 DIAGNOSIS — E213 Hyperparathyroidism, unspecified: Secondary | ICD-10-CM | POA: Diagnosis not present

## 2017-04-20 DIAGNOSIS — M810 Age-related osteoporosis without current pathological fracture: Secondary | ICD-10-CM | POA: Diagnosis not present

## 2017-04-20 DIAGNOSIS — R790 Abnormal level of blood mineral: Secondary | ICD-10-CM | POA: Diagnosis not present

## 2017-04-21 DIAGNOSIS — M0589 Other rheumatoid arthritis with rheumatoid factor of multiple sites: Secondary | ICD-10-CM | POA: Diagnosis not present

## 2017-04-21 DIAGNOSIS — M069 Rheumatoid arthritis, unspecified: Secondary | ICD-10-CM | POA: Diagnosis not present

## 2017-05-27 DIAGNOSIS — Z0000 Encounter for general adult medical examination without abnormal findings: Secondary | ICD-10-CM | POA: Diagnosis not present

## 2017-05-27 DIAGNOSIS — E559 Vitamin D deficiency, unspecified: Secondary | ICD-10-CM | POA: Diagnosis not present

## 2017-05-27 DIAGNOSIS — E785 Hyperlipidemia, unspecified: Secondary | ICD-10-CM | POA: Diagnosis not present

## 2017-05-27 DIAGNOSIS — R790 Abnormal level of blood mineral: Secondary | ICD-10-CM | POA: Diagnosis not present

## 2017-06-02 DIAGNOSIS — R790 Abnormal level of blood mineral: Secondary | ICD-10-CM | POA: Diagnosis not present

## 2017-06-02 DIAGNOSIS — M810 Age-related osteoporosis without current pathological fracture: Secondary | ICD-10-CM | POA: Diagnosis not present

## 2017-06-02 DIAGNOSIS — E213 Hyperparathyroidism, unspecified: Secondary | ICD-10-CM | POA: Diagnosis not present

## 2017-06-04 DIAGNOSIS — Z0000 Encounter for general adult medical examination without abnormal findings: Secondary | ICD-10-CM | POA: Diagnosis not present

## 2017-06-04 DIAGNOSIS — E785 Hyperlipidemia, unspecified: Secondary | ICD-10-CM | POA: Diagnosis not present

## 2017-06-09 DIAGNOSIS — E213 Hyperparathyroidism, unspecified: Secondary | ICD-10-CM | POA: Diagnosis not present

## 2017-06-09 DIAGNOSIS — M810 Age-related osteoporosis without current pathological fracture: Secondary | ICD-10-CM | POA: Diagnosis not present

## 2017-06-09 DIAGNOSIS — M0589 Other rheumatoid arthritis with rheumatoid factor of multiple sites: Secondary | ICD-10-CM | POA: Diagnosis not present

## 2017-06-09 DIAGNOSIS — M5030 Other cervical disc degeneration, unspecified cervical region: Secondary | ICD-10-CM | POA: Diagnosis not present

## 2017-06-09 DIAGNOSIS — M150 Primary generalized (osteo)arthritis: Secondary | ICD-10-CM | POA: Diagnosis not present

## 2017-06-09 DIAGNOSIS — Z79899 Other long term (current) drug therapy: Secondary | ICD-10-CM | POA: Diagnosis not present

## 2017-06-15 DIAGNOSIS — L821 Other seborrheic keratosis: Secondary | ICD-10-CM | POA: Diagnosis not present

## 2017-06-15 DIAGNOSIS — D225 Melanocytic nevi of trunk: Secondary | ICD-10-CM | POA: Diagnosis not present

## 2017-06-15 DIAGNOSIS — L720 Epidermal cyst: Secondary | ICD-10-CM | POA: Diagnosis not present

## 2017-06-15 DIAGNOSIS — L820 Inflamed seborrheic keratosis: Secondary | ICD-10-CM | POA: Diagnosis not present

## 2017-06-16 DIAGNOSIS — M0589 Other rheumatoid arthritis with rheumatoid factor of multiple sites: Secondary | ICD-10-CM | POA: Diagnosis not present

## 2017-07-14 DIAGNOSIS — M810 Age-related osteoporosis without current pathological fracture: Secondary | ICD-10-CM | POA: Diagnosis not present

## 2017-07-14 DIAGNOSIS — E213 Hyperparathyroidism, unspecified: Secondary | ICD-10-CM | POA: Diagnosis not present

## 2017-08-05 DIAGNOSIS — M810 Age-related osteoporosis without current pathological fracture: Secondary | ICD-10-CM | POA: Diagnosis not present

## 2017-08-17 DIAGNOSIS — M0589 Other rheumatoid arthritis with rheumatoid factor of multiple sites: Secondary | ICD-10-CM | POA: Diagnosis not present

## 2017-09-08 DIAGNOSIS — M5030 Other cervical disc degeneration, unspecified cervical region: Secondary | ICD-10-CM | POA: Diagnosis not present

## 2017-09-08 DIAGNOSIS — E213 Hyperparathyroidism, unspecified: Secondary | ICD-10-CM | POA: Diagnosis not present

## 2017-09-08 DIAGNOSIS — Z79899 Other long term (current) drug therapy: Secondary | ICD-10-CM | POA: Diagnosis not present

## 2017-09-08 DIAGNOSIS — M0589 Other rheumatoid arthritis with rheumatoid factor of multiple sites: Secondary | ICD-10-CM | POA: Diagnosis not present

## 2017-09-08 DIAGNOSIS — M150 Primary generalized (osteo)arthritis: Secondary | ICD-10-CM | POA: Diagnosis not present

## 2017-09-30 DIAGNOSIS — E213 Hyperparathyroidism, unspecified: Secondary | ICD-10-CM | POA: Diagnosis not present

## 2017-09-30 DIAGNOSIS — E785 Hyperlipidemia, unspecified: Secondary | ICD-10-CM | POA: Diagnosis not present

## 2017-09-30 DIAGNOSIS — M810 Age-related osteoporosis without current pathological fracture: Secondary | ICD-10-CM | POA: Diagnosis not present

## 2017-09-30 DIAGNOSIS — E559 Vitamin D deficiency, unspecified: Secondary | ICD-10-CM | POA: Diagnosis not present

## 2017-10-12 DIAGNOSIS — M0589 Other rheumatoid arthritis with rheumatoid factor of multiple sites: Secondary | ICD-10-CM | POA: Diagnosis not present

## 2017-10-26 DIAGNOSIS — M0589 Other rheumatoid arthritis with rheumatoid factor of multiple sites: Secondary | ICD-10-CM | POA: Diagnosis not present

## 2017-11-09 DIAGNOSIS — M0589 Other rheumatoid arthritis with rheumatoid factor of multiple sites: Secondary | ICD-10-CM | POA: Diagnosis not present

## 2017-12-03 DIAGNOSIS — M810 Age-related osteoporosis without current pathological fracture: Secondary | ICD-10-CM | POA: Diagnosis not present

## 2017-12-03 DIAGNOSIS — E213 Hyperparathyroidism, unspecified: Secondary | ICD-10-CM | POA: Diagnosis not present

## 2017-12-03 DIAGNOSIS — F17210 Nicotine dependence, cigarettes, uncomplicated: Secondary | ICD-10-CM | POA: Diagnosis not present

## 2017-12-03 DIAGNOSIS — E785 Hyperlipidemia, unspecified: Secondary | ICD-10-CM | POA: Diagnosis not present

## 2017-12-03 DIAGNOSIS — E875 Hyperkalemia: Secondary | ICD-10-CM | POA: Diagnosis not present

## 2017-12-07 DIAGNOSIS — M0589 Other rheumatoid arthritis with rheumatoid factor of multiple sites: Secondary | ICD-10-CM | POA: Diagnosis not present

## 2017-12-10 DIAGNOSIS — M0589 Other rheumatoid arthritis with rheumatoid factor of multiple sites: Secondary | ICD-10-CM | POA: Diagnosis not present

## 2017-12-10 DIAGNOSIS — M810 Age-related osteoporosis without current pathological fracture: Secondary | ICD-10-CM | POA: Diagnosis not present

## 2017-12-10 DIAGNOSIS — Z79899 Other long term (current) drug therapy: Secondary | ICD-10-CM | POA: Diagnosis not present

## 2017-12-10 DIAGNOSIS — M5030 Other cervical disc degeneration, unspecified cervical region: Secondary | ICD-10-CM | POA: Diagnosis not present

## 2017-12-10 DIAGNOSIS — M150 Primary generalized (osteo)arthritis: Secondary | ICD-10-CM | POA: Diagnosis not present

## 2017-12-10 DIAGNOSIS — E213 Hyperparathyroidism, unspecified: Secondary | ICD-10-CM | POA: Diagnosis not present

## 2018-01-04 DIAGNOSIS — M0589 Other rheumatoid arthritis with rheumatoid factor of multiple sites: Secondary | ICD-10-CM | POA: Diagnosis not present

## 2018-01-13 DIAGNOSIS — M069 Rheumatoid arthritis, unspecified: Secondary | ICD-10-CM | POA: Diagnosis not present

## 2018-01-20 DIAGNOSIS — M810 Age-related osteoporosis without current pathological fracture: Secondary | ICD-10-CM | POA: Diagnosis not present

## 2018-01-20 DIAGNOSIS — E213 Hyperparathyroidism, unspecified: Secondary | ICD-10-CM | POA: Diagnosis not present

## 2018-01-20 DIAGNOSIS — E785 Hyperlipidemia, unspecified: Secondary | ICD-10-CM | POA: Diagnosis not present

## 2018-02-01 DIAGNOSIS — M0589 Other rheumatoid arthritis with rheumatoid factor of multiple sites: Secondary | ICD-10-CM | POA: Diagnosis not present

## 2018-02-07 DIAGNOSIS — M810 Age-related osteoporosis without current pathological fracture: Secondary | ICD-10-CM | POA: Diagnosis not present

## 2018-03-08 DIAGNOSIS — M0589 Other rheumatoid arthritis with rheumatoid factor of multiple sites: Secondary | ICD-10-CM | POA: Diagnosis not present

## 2018-03-15 DIAGNOSIS — H43812 Vitreous degeneration, left eye: Secondary | ICD-10-CM | POA: Diagnosis not present

## 2018-03-15 DIAGNOSIS — Z961 Presence of intraocular lens: Secondary | ICD-10-CM | POA: Diagnosis not present

## 2018-03-15 DIAGNOSIS — H1859 Other hereditary corneal dystrophies: Secondary | ICD-10-CM | POA: Diagnosis not present

## 2018-03-15 DIAGNOSIS — H52203 Unspecified astigmatism, bilateral: Secondary | ICD-10-CM | POA: Diagnosis not present

## 2018-04-05 DIAGNOSIS — M0589 Other rheumatoid arthritis with rheumatoid factor of multiple sites: Secondary | ICD-10-CM | POA: Diagnosis not present

## 2018-04-12 DIAGNOSIS — M150 Primary generalized (osteo)arthritis: Secondary | ICD-10-CM | POA: Diagnosis not present

## 2018-04-12 DIAGNOSIS — M5030 Other cervical disc degeneration, unspecified cervical region: Secondary | ICD-10-CM | POA: Diagnosis not present

## 2018-04-12 DIAGNOSIS — Z79899 Other long term (current) drug therapy: Secondary | ICD-10-CM | POA: Diagnosis not present

## 2018-04-12 DIAGNOSIS — E213 Hyperparathyroidism, unspecified: Secondary | ICD-10-CM | POA: Diagnosis not present

## 2018-04-12 DIAGNOSIS — M0589 Other rheumatoid arthritis with rheumatoid factor of multiple sites: Secondary | ICD-10-CM | POA: Diagnosis not present

## 2018-04-12 DIAGNOSIS — M810 Age-related osteoporosis without current pathological fracture: Secondary | ICD-10-CM | POA: Diagnosis not present

## 2018-05-04 DIAGNOSIS — M0589 Other rheumatoid arthritis with rheumatoid factor of multiple sites: Secondary | ICD-10-CM | POA: Diagnosis not present

## 2018-06-29 DIAGNOSIS — M0589 Other rheumatoid arthritis with rheumatoid factor of multiple sites: Secondary | ICD-10-CM | POA: Diagnosis not present

## 2018-07-11 DIAGNOSIS — M0589 Other rheumatoid arthritis with rheumatoid factor of multiple sites: Secondary | ICD-10-CM | POA: Diagnosis not present

## 2018-07-11 DIAGNOSIS — Z79899 Other long term (current) drug therapy: Secondary | ICD-10-CM | POA: Diagnosis not present

## 2018-07-11 DIAGNOSIS — M5030 Other cervical disc degeneration, unspecified cervical region: Secondary | ICD-10-CM | POA: Diagnosis not present

## 2018-07-11 DIAGNOSIS — M150 Primary generalized (osteo)arthritis: Secondary | ICD-10-CM | POA: Diagnosis not present

## 2018-07-11 DIAGNOSIS — E213 Hyperparathyroidism, unspecified: Secondary | ICD-10-CM | POA: Diagnosis not present

## 2018-07-11 DIAGNOSIS — M810 Age-related osteoporosis without current pathological fracture: Secondary | ICD-10-CM | POA: Diagnosis not present

## 2018-07-20 DIAGNOSIS — L739 Follicular disorder, unspecified: Secondary | ICD-10-CM | POA: Diagnosis not present

## 2018-07-20 DIAGNOSIS — M069 Rheumatoid arthritis, unspecified: Secondary | ICD-10-CM | POA: Diagnosis not present

## 2018-07-25 DIAGNOSIS — Z7189 Other specified counseling: Secondary | ICD-10-CM | POA: Diagnosis not present

## 2018-07-25 DIAGNOSIS — E785 Hyperlipidemia, unspecified: Secondary | ICD-10-CM | POA: Diagnosis not present

## 2018-07-25 DIAGNOSIS — M810 Age-related osteoporosis without current pathological fracture: Secondary | ICD-10-CM | POA: Diagnosis not present

## 2018-07-25 DIAGNOSIS — E213 Hyperparathyroidism, unspecified: Secondary | ICD-10-CM | POA: Diagnosis not present

## 2018-08-05 DIAGNOSIS — M0589 Other rheumatoid arthritis with rheumatoid factor of multiple sites: Secondary | ICD-10-CM | POA: Diagnosis not present

## 2018-08-12 DIAGNOSIS — M810 Age-related osteoporosis without current pathological fracture: Secondary | ICD-10-CM | POA: Diagnosis not present

## 2018-08-26 ENCOUNTER — Encounter: Payer: Self-pay | Admitting: Internal Medicine

## 2018-08-26 DIAGNOSIS — E785 Hyperlipidemia, unspecified: Secondary | ICD-10-CM | POA: Diagnosis not present

## 2018-08-26 DIAGNOSIS — Z780 Asymptomatic menopausal state: Secondary | ICD-10-CM | POA: Diagnosis not present

## 2018-08-26 DIAGNOSIS — M0589 Other rheumatoid arthritis with rheumatoid factor of multiple sites: Secondary | ICD-10-CM | POA: Diagnosis not present

## 2018-08-26 DIAGNOSIS — L03032 Cellulitis of left toe: Secondary | ICD-10-CM | POA: Diagnosis not present

## 2018-08-26 DIAGNOSIS — J449 Chronic obstructive pulmonary disease, unspecified: Secondary | ICD-10-CM | POA: Diagnosis not present

## 2018-08-26 DIAGNOSIS — K635 Polyp of colon: Secondary | ICD-10-CM | POA: Diagnosis not present

## 2018-08-26 DIAGNOSIS — F17210 Nicotine dependence, cigarettes, uncomplicated: Secondary | ICD-10-CM | POA: Diagnosis not present

## 2018-08-26 DIAGNOSIS — M810 Age-related osteoporosis without current pathological fracture: Secondary | ICD-10-CM | POA: Diagnosis not present

## 2018-08-26 DIAGNOSIS — Z0000 Encounter for general adult medical examination without abnormal findings: Secondary | ICD-10-CM | POA: Diagnosis not present

## 2018-09-01 ENCOUNTER — Ambulatory Visit (INDEPENDENT_AMBULATORY_CARE_PROVIDER_SITE_OTHER): Payer: Medicare Other | Admitting: Podiatry

## 2018-09-01 ENCOUNTER — Encounter: Payer: Self-pay | Admitting: Podiatry

## 2018-09-01 ENCOUNTER — Other Ambulatory Visit: Payer: Self-pay

## 2018-09-01 VITALS — Temp 98.2°F

## 2018-09-01 DIAGNOSIS — L539 Erythematous condition, unspecified: Secondary | ICD-10-CM

## 2018-09-01 DIAGNOSIS — B351 Tinea unguium: Secondary | ICD-10-CM

## 2018-09-01 DIAGNOSIS — L600 Ingrowing nail: Secondary | ICD-10-CM | POA: Diagnosis not present

## 2018-09-01 DIAGNOSIS — M79675 Pain in left toe(s): Secondary | ICD-10-CM

## 2018-09-01 DIAGNOSIS — M79674 Pain in right toe(s): Secondary | ICD-10-CM

## 2018-09-01 NOTE — Patient Instructions (Signed)

## 2018-09-05 NOTE — Progress Notes (Signed)
Subjective:   Patient ID: Olivia Hooper, female   DOB: 78 y.o.   MRN: [id number]   HPI 78 year old female presents the office today for concerns of ingrown toenail on the left big toe as well as for redness around the toe.  She is still taking antibiotics, cephalexin for this.  She denies any drainage or pus.  She has not seen any improvement while on the antibiotics.  No red streaks.  The nail is tender with pressure.  Also she is the nails were trimmed today as well they are thick and discolored and causing discomfort.  No redness or drainage in the other toenail sites.  Review of Systems  All other systems reviewed and are negative.  Past Medical History:  Diagnosis Date  . Breast cancer ([place])   . Carpal tunnel syndrome   . COPD (chronic obstructive pulmonary disease) ([place])   . Hypercalcemia   . Hyperlipidemia   . Hyperparathyroidism ([place])   . Osteoporosis   . Pneumonia   . RA (rheumatoid arthritis) ([place])   . Vertigo   . Vitamin D deficiency     Past Surgical History:  Procedure Laterality Date  . CARPAL TUNNEL RELEASE    . CATARACT EXTRACTION    . MASTECTOMY Left   . VESICOVAGINAL FISTULA CLOSURE W/ TAH       Current Outpatient Medications:  .  albuterol (VENTOLIN HFA) 108 (90 Base) MCG/ACT inhaler, Inhale into the lungs every 6 (six) hours as needed for wheezing or shortness of breath., Disp: , Rfl:  .  BIOTIN PO, Take by mouth., Disp: , Rfl:  .  buPROPion (WELLBUTRIN) 75 MG tablet, Take 75 mg by mouth 2 (two) times daily., Disp: , Rfl:  .  cephALEXin (KEFLEX) 250 MG capsule, Take by mouth 4 (four) times daily., Disp: , Rfl:  .  Cyanocobalamin (VITAMIN B-12 PO), Take by mouth., Disp: , Rfl:  .  denosumab (PROLIA) 60 MG/ML SOSY injection, Inject 60 mg into the skin every 6 (six) months., Disp: , Rfl:  .  methocarbamol (ROBAXIN) 500 MG tablet, Take 500 mg by mouth 4 (four) times daily., Disp: , Rfl:  .  mometasone-formoterol (DULERA) 100-5 MCG/ACT AERO, Inhale 2 puffs into  the lungs 2 (two) times daily., Disp: , Rfl:  .  traMADol (ULTRAM) 50 MG tablet, Take by mouth every 6 (six) hours as needed., Disp: , Rfl:  .  triamcinolone ointment (KENALOG) 0.1 %, Apply 1 application topically 2 (two) times daily., Disp: , Rfl:  .  folic acid (FOLVITE) 1 MG tablet, Take 3 mg by mouth daily. , Disp: , Rfl:  .  methotrexate (RHEUMATREX) 2.5 MG tablet, 7 tablets every 7 days, Disp: , Rfl:   Allergies  Allergen Reactions  . Buprenorphine Hcl Nausea And Vomiting  . Morphine And Related Nausea And Vomiting             Objective:  Physical Exam  General: AAO x3, NAD  Dermatological: All nails are hypertrophic, dystrophic, discolored with yellow-brown discoloration.  Particularly left hallux toenail significantly incurvated most of the medial aspect there is localized edema and erythema to the area there is no ascending cellulitis or increase in warmth.  There is no drainage or pus identified today.  Vascular: PT pulses 2/4 DP pulse 1/4, CRT intact.  Neruologic: Grossly intact via light touch bilateral.   Musculoskeletal: No gross boney pedal deformities bilateral. No pain, crepitus, or limitation noted with foot and ankle range of motion bilateral.  Assessment:   Left hallux ingrown toenail with localized erythema; symptomatic onychomycosis     Plan:  -Treatment options discussed including all alternatives, risks, and complications -Etiology of symptoms were discussed -We discussed nail removal but she wants to try to hold off on this.  I did sharply debride the left hallux toenail to remove a significant portion of the ingrowing of the nail there is no purulence identified.  No signs of an abscess.  Recommend antibiotic ointment and a dressing daily.  Also Epson salt soaks. -Debrided nails x9 without any complications or bleeding otherwise.  Follow-up as scheduled for nail check.  If no improvement will need to likely have the nail removed.  Elisabet Slade DPM

## 2018-09-08 ENCOUNTER — Encounter: Payer: Self-pay | Admitting: Podiatry

## 2018-09-08 ENCOUNTER — Other Ambulatory Visit: Payer: Self-pay

## 2018-09-08 ENCOUNTER — Ambulatory Visit (INDEPENDENT_AMBULATORY_CARE_PROVIDER_SITE_OTHER): Payer: Medicare Other | Admitting: Podiatry

## 2018-09-08 VITALS — Temp 98.4°F

## 2018-09-08 DIAGNOSIS — L539 Erythematous condition, unspecified: Secondary | ICD-10-CM

## 2018-09-08 DIAGNOSIS — L600 Ingrowing nail: Secondary | ICD-10-CM | POA: Diagnosis not present

## 2018-09-12 DIAGNOSIS — M0589 Other rheumatoid arthritis with rheumatoid factor of multiple sites: Secondary | ICD-10-CM | POA: Diagnosis not present

## 2018-09-21 NOTE — Progress Notes (Signed)
Subjective: Olivia Hooper presents today for follow up evaluation of erythema to the left big toe. She states that it is doing a lot better. There is no pain, swelling, drainage the redness has almost completely resolved. She has no new concerns today.  Denies any systemic complaints such as fevers, chills, nausea, vomiting. No acute changes since last appointment, and no other complaints at this time.   Objective: AAO x3, NAD Neurovascular status unchanged.  The erythema that was present along the left hallux toenail is much improved and almost completely resolved. There is no pain to the nail, no swelling, ascending cellulitis. No drainage/pus. No open lesions or pre-ulcerative lesions.  No pain with calf compression, swelling, warmth, erythema  Assessment: Improved erythema left hallux  Plan: -All treatment options discussed with the patient including all alternatives, risks, complications.  -I did further debride some to the nail today without any complications or bleeding. Continue antibiotic ointment daily. If there is any worsening or not completely resolved in the next 1-2 weeks to let me know. Otherwise we will see her back at her regularly scheduled appointment for nail trim.  -Patient encouraged to call the office with any questions, concerns, change in symptoms.   Olivia Hooper DPM

## 2018-10-11 DIAGNOSIS — E213 Hyperparathyroidism, unspecified: Secondary | ICD-10-CM | POA: Diagnosis not present

## 2018-10-11 DIAGNOSIS — M810 Age-related osteoporosis without current pathological fracture: Secondary | ICD-10-CM | POA: Diagnosis not present

## 2018-10-11 DIAGNOSIS — M0589 Other rheumatoid arthritis with rheumatoid factor of multiple sites: Secondary | ICD-10-CM | POA: Diagnosis not present

## 2018-10-11 DIAGNOSIS — M150 Primary generalized (osteo)arthritis: Secondary | ICD-10-CM | POA: Diagnosis not present

## 2018-10-11 DIAGNOSIS — M5030 Other cervical disc degeneration, unspecified cervical region: Secondary | ICD-10-CM | POA: Diagnosis not present

## 2018-10-11 DIAGNOSIS — Z79899 Other long term (current) drug therapy: Secondary | ICD-10-CM | POA: Diagnosis not present

## 2018-11-08 DIAGNOSIS — Z79899 Other long term (current) drug therapy: Secondary | ICD-10-CM | POA: Diagnosis not present

## 2018-11-08 DIAGNOSIS — M069 Rheumatoid arthritis, unspecified: Secondary | ICD-10-CM | POA: Diagnosis not present

## 2018-11-08 DIAGNOSIS — M0589 Other rheumatoid arthritis with rheumatoid factor of multiple sites: Secondary | ICD-10-CM | POA: Diagnosis not present

## 2018-12-02 ENCOUNTER — Ambulatory Visit (INDEPENDENT_AMBULATORY_CARE_PROVIDER_SITE_OTHER): Payer: Medicare Other | Admitting: Podiatry

## 2018-12-02 ENCOUNTER — Other Ambulatory Visit: Payer: Self-pay

## 2018-12-02 ENCOUNTER — Encounter: Payer: Self-pay | Admitting: Podiatry

## 2018-12-02 DIAGNOSIS — M79674 Pain in right toe(s): Secondary | ICD-10-CM

## 2018-12-02 DIAGNOSIS — B351 Tinea unguium: Secondary | ICD-10-CM | POA: Diagnosis not present

## 2018-12-02 DIAGNOSIS — M79675 Pain in left toe(s): Secondary | ICD-10-CM | POA: Diagnosis not present

## 2018-12-02 NOTE — Patient Instructions (Signed)

## 2018-12-06 DIAGNOSIS — M0589 Other rheumatoid arthritis with rheumatoid factor of multiple sites: Secondary | ICD-10-CM | POA: Diagnosis not present

## 2018-12-06 NOTE — Progress Notes (Signed)
Subjective: Olivia Hooper is seen today for follow up painful, elongated, thickened toenails 1-5 b/l feet that she cannot cut. Pain interferes with daily activities. Aggravating factor includes wearing enclosed shoe gear and relieved with periodic debridement.  Current Outpatient Medications on File Prior to Visit  Medication Sig  . albuterol (VENTOLIN HFA) 108 (90 Base) MCG/ACT inhaler Inhale into the lungs every 6 (six) hours as needed for wheezing or shortness of breath.  Marland Kitchen BIOTIN PO Take by mouth.  Marland Kitchen buPROPion (WELLBUTRIN) 75 MG tablet Take 75 mg by mouth 2 (two) times daily.  . cephALEXin (KEFLEX) 250 MG capsule Take by mouth 4 (four) times daily.  . Cyanocobalamin (VITAMIN B-12 PO) Take by mouth.  . denosumab (PROLIA) 60 MG/ML SOSY injection Inject 60 mg into the skin every 6 (six) months.  . folic acid (FOLVITE) 1 MG tablet Take 3 mg by mouth daily.   . methocarbamol (ROBAXIN) 500 MG tablet Take 500 mg by mouth 4 (four) times daily.  . methotrexate (RHEUMATREX) 2.5 MG tablet 7 tablets every 7 days  . methotrexate 2.5 MG tablet   . mometasone-formoterol (DULERA) 100-5 MCG/ACT AERO Inhale 2 puffs into the lungs 2 (two) times daily.  . simvastatin (ZOCOR) 40 MG tablet   . traMADol (ULTRAM) 50 MG tablet Take by mouth every 6 (six) hours as needed.  . triamcinolone ointment (KENALOG) 0.1 % Apply 1 application topically 2 (two) times daily.   No current facility-administered medications on file prior to visit.      Allergies  Allergen Reactions  . Buprenorphine Hcl Nausea And Vomiting  . Morphine And Related Nausea And Vomiting          Objective:  Vascular Examination: Capillary refill time immediate x 10 digits.  Dorsalis pedis 1/4 b/l.  Posterior tibial pulses 2/4 b/l.  Digital hair sparse b/l.  Skin temperature gradient WNL b/l.   Dermatological Examination: Skin with normal turgor, texture and tone b/l.  Toenails 1-5 b/l discolored, thick, dystrophic with  subungual debris and pain with palpation to nailbeds due to thickness of nails.  Musculoskeletal: Muscle strength 5/5 to all LE muscle groups  No gross bony deformities b/l.  No pain, crepitus or joint limitation noted with ROM.   Neurological Examination: Protective sensation intact with 10 gram monofilament bilaterally.  Vibratory sensation intact bilaterally.   Assessment: Painful onychomycosis toenails 1-5 b/l   Plan: 1. Toenails 1-5 b/l were debrided in length and girth without iatrogenic bleeding. 2. Patient to continue soft, supportive shoe gear 3. Patient to report any pedal injuries to medical professional immediately. 4. Follow up 3 months.  5. Patient/POA to call should there be a concern in the interim.

## 2019-01-03 DIAGNOSIS — M0589 Other rheumatoid arthritis with rheumatoid factor of multiple sites: Secondary | ICD-10-CM | POA: Diagnosis not present

## 2019-01-05 ENCOUNTER — Other Ambulatory Visit: Payer: Self-pay

## 2019-01-05 ENCOUNTER — Encounter (HOSPITAL_BASED_OUTPATIENT_CLINIC_OR_DEPARTMENT_OTHER): Payer: Self-pay | Admitting: Emergency Medicine

## 2019-01-05 ENCOUNTER — Emergency Department (HOSPITAL_BASED_OUTPATIENT_CLINIC_OR_DEPARTMENT_OTHER)
Admission: EM | Admit: 2019-01-05 | Discharge: 2019-01-05 | Discharge status: Against Medical Advice | Payer: Medicare Other | Attending: Emergency Medicine | Admitting: Emergency Medicine

## 2019-01-05 DIAGNOSIS — Z5321 Procedure and treatment not carried out due to patient leaving prior to being seen by health care provider: Secondary | ICD-10-CM | POA: Insufficient documentation

## 2019-01-05 DIAGNOSIS — R042 Hemoptysis: Secondary | ICD-10-CM | POA: Diagnosis not present

## 2019-01-05 DIAGNOSIS — R05 Cough: Secondary | ICD-10-CM | POA: Insufficient documentation

## 2019-01-05 NOTE — ED Notes (Signed)
Patient notified registration that she was leaving.

## 2019-01-05 NOTE — ED Triage Notes (Addendum)
Pt reports cough for several weeks, occasionally coughs up pink sputum. She had a CXR yesterday and was told it was "abnormal" and come in for CT scan to r/o malignancy. Denies SOB, chest pain

## 2019-01-06 ENCOUNTER — Emergency Department (HOSPITAL_BASED_OUTPATIENT_CLINIC_OR_DEPARTMENT_OTHER): Payer: Medicare Other

## 2019-01-06 ENCOUNTER — Other Ambulatory Visit: Payer: Self-pay

## 2019-01-06 ENCOUNTER — Encounter (HOSPITAL_BASED_OUTPATIENT_CLINIC_OR_DEPARTMENT_OTHER): Payer: Self-pay | Admitting: Emergency Medicine

## 2019-01-06 ENCOUNTER — Emergency Department (HOSPITAL_BASED_OUTPATIENT_CLINIC_OR_DEPARTMENT_OTHER)
Admission: EM | Admit: 2019-01-06 | Discharge: 2019-01-06 | Disposition: A | Discharge status: Home / Self Care | Payer: Medicare Other | Attending: Emergency Medicine | Admitting: Emergency Medicine

## 2019-01-06 DIAGNOSIS — J449 Chronic obstructive pulmonary disease, unspecified: Secondary | ICD-10-CM | POA: Diagnosis not present

## 2019-01-06 DIAGNOSIS — Z853 Personal history of malignant neoplasm of breast: Secondary | ICD-10-CM | POA: Insufficient documentation

## 2019-01-06 DIAGNOSIS — R05 Cough: Secondary | ICD-10-CM | POA: Insufficient documentation

## 2019-01-06 DIAGNOSIS — Z79899 Other long term (current) drug therapy: Secondary | ICD-10-CM | POA: Diagnosis not present

## 2019-01-06 DIAGNOSIS — F17210 Nicotine dependence, cigarettes, uncomplicated: Secondary | ICD-10-CM | POA: Diagnosis not present

## 2019-01-06 DIAGNOSIS — J984 Other disorders of lung: Secondary | ICD-10-CM | POA: Insufficient documentation

## 2019-01-06 DIAGNOSIS — R0602 Shortness of breath: Secondary | ICD-10-CM | POA: Diagnosis not present

## 2019-01-06 DIAGNOSIS — A150 Tuberculosis of lung: Secondary | ICD-10-CM | POA: Diagnosis not present

## 2019-01-06 DIAGNOSIS — R918 Other nonspecific abnormal finding of lung field: Secondary | ICD-10-CM | POA: Diagnosis not present

## 2019-01-06 LAB — BASIC METABOLIC PANEL
Anion gap: 9 (ref 5–15)
BUN: 10 mg/dL (ref 8–23)
CO2: 28 mmol/L (ref 22–32)
Calcium: 10.5 mg/dL — ABNORMAL HIGH (ref 8.9–10.3)
Chloride: 101 mmol/L (ref 98–111)
Creatinine, Ser: 0.72 mg/dL (ref 0.44–1.00)
GFR calc Af Amer: >60 mL/min (ref >60)
GFR calc non Af Amer: >60 mL/min (ref >60)
Glucose, Bld: 88 mg/dL (ref 70–99)
Potassium: 3.9 mmol/L (ref 3.5–5.1)
Sodium: 138 mmol/L (ref 135–145)

## 2019-01-06 LAB — CBC WITH DIFFERENTIAL/PLATELET
Abs Immature Granulocytes: 0.02 10*3/uL (ref 0.00–0.07)
Basophils Absolute: 0.1 10*3/uL (ref 0.0–0.1)
Basophils Relative: 1 %
Eosinophils Absolute: 0.4 10*3/uL (ref 0.0–0.5)
Eosinophils Relative: 5 %
HCT: 33.7 % — ABNORMAL LOW (ref 36.0–46.0)
Hemoglobin: 10.1 g/dL — ABNORMAL LOW (ref 12.0–15.0)
Immature Granulocytes: 0 %
Lymphocytes Relative: 23 %
Lymphs Abs: 1.8 10*3/uL (ref 0.7–4.0)
MCH: 26.6 pg (ref 26.0–34.0)
MCHC: 30 g/dL (ref 30.0–36.0)
MCV: 88.9 fL (ref 80.0–100.0)
Monocytes Absolute: 0.8 10*3/uL (ref 0.1–1.0)
Monocytes Relative: 11 %
Neutro Abs: 4.6 10*3/uL (ref 1.7–7.7)
Neutrophils Relative %: 60 %
Platelets: 330 10*3/uL (ref 150–400)
RBC: 3.79 MIL/uL — ABNORMAL LOW (ref 3.87–5.11)
RDW: 16.6 % — ABNORMAL HIGH (ref 11.5–15.5)
WBC: 7.6 10*3/uL (ref 4.0–10.5)
nRBC: 0 % (ref 0.0–0.2)

## 2019-01-06 MED ORDER — IOHEXOL 300 MG/ML  SOLN
100.0000 mL | Freq: Once | INTRAMUSCULAR | Status: AC | PRN
  Administered 2019-01-06: 80 mL via INTRAVENOUS

## 2019-01-06 NOTE — ED Provider Notes (Signed)
[facility] EMERGENCY DEPARTMENT Provider Note   CSN: [id number] Arrival date & time: 01/06/19  [id number]     History   Chief Complaint Chief Complaint  Patient presents with   sent by pmd for CT chest    HPI Olivia Hooper is a 78 y.o. female.  She has a remote history of breast cancer and also is immune suppressed with rheumatoid arthritis.  She was having at least a month or so of cough with intermittent blood.  She said it was pink in nature and was not frank blood.  She saw her primary care doctor yesterday and had a chest x-ray.  He called her back and said that she had a cavitary lesion and she needed to come to the ER to get a chest CT.  She denies any fevers chills weight loss.  She said she has had maybe 2 episodes of night sweats in the last month.  Denies any TB exposures or prior contact for TB.  She is immunosuppressed on methotrexate.  She said she has baseline shortness of breath and uses inhalers but does not feel any different since the hemoptysis began.  No recent travel or sick contacts.     The history is provided by the patient.  Cough Cough characteristics:  Productive Sputum characteristics:  Pink Severity:  Mild Onset quality:  Gradual Duration:  4 weeks Timing:  Sporadic Progression:  Unchanged Chronicity:  New Smoker: yes   Context: not sick contacts   Relieved by:  None tried Worsened by:  Nothing Ineffective treatments:  None tried Associated symptoms: shortness of breath   Associated symptoms: no chest pain, no chills, no fever, no headaches, no myalgias, no rash, no sore throat and no weight loss   Risk factors: no recent travel     Past Medical History:  Diagnosis Date   Breast cancer ([place])    Carpal tunnel syndrome    COPD (chronic obstructive pulmonary disease) ([place])    Hypercalcemia    Hyperlipidemia    Hyperparathyroidism ([place])    Osteoporosis    Pneumonia    RA (rheumatoid arthritis) ([place])    Vertigo    Vitamin  D deficiency     Patient Active Problem List   Diagnosis Date Noted   S/P eye surgery 01/25/2015   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      Home Medications    Prior to Admission medications   Medication Sig Start Date End Date Taking? Authorizing Provider  albuterol (VENTOLIN HFA) 108 (90 Base) MCG/ACT inhaler Inhale into the lungs every 6 (six) hours as needed for wheezing or shortness of breath.    [provider]  denosumab (PROLIA) 60 MG/ML SOSY injection Inject 60 mg into the skin every 6 (six) months.    [provider]  folic acid (  FOLVITE) 1 MG tablet Take 3 mg by mouth daily.     [provider]  methotrexate 2.5 MG tablet  11/27/18   [provider]  mometasone-formoterol (DULERA) 100-5 MCG/ACT AERO Inhale 2 puffs into the lungs 2 (two) times daily.    [provider]  simvastatin (ZOCOR) 40 MG tablet  12/01/18   [provider]  traMADol (ULTRAM) 50 MG tablet Take by mouth every 6 (six) hours as needed.    [provider]  triamcinolone ointment (KENALOG) 0.1 % Apply 1 application topically 2 (two) times daily.    [provider]    Family History Family History  Problem Relation Age of Onset   Heart disease Father    Bone cancer Father    Emphysema Father          never smoker   Heart disease Mother    Colon cancer Mother    Melanoma Sister    Kidney cancer Brother     Social History Social History   Tobacco Use   Smoking status: Current Some Day Smoker    Packs/day: 1.00    Years: 62.00    Pack years: 62.00    Types: Cigarettes   Smokeless tobacco: Never Used   Tobacco comment: cut back to 1 cig per day 02/06/14-lmr  Substance Use Topics   Alcohol use: No    Alcohol/week: 0.0 standard drinks   Drug use: No     Allergies   Buprenorphine hcl and Morphine and related   Review of Systems Review of Systems  Constitutional: Negative for chills, fever and weight loss.  HENT: Negative for sore throat.   Eyes: Negative for visual disturbance.  Respiratory: Positive for cough and shortness of breath.   Cardiovascular: Negative for chest pain.  Gastrointestinal: Negative for abdominal pain.  Genitourinary: Negative for dysuria.  Musculoskeletal: Negative for myalgias.  Skin: Negative for rash.  Neurological: Negative for headaches.     Physical Exam Updated Vital Signs BP (!) 134/101 (BP Location: Right Arm)    Pulse 97    Temp 98.4 F (36.9 C) (Oral)    Resp 20    Ht 5' 1.5" (1.562 m)    Wt 40.4 kg    SpO2 93%    BMI 16.56 kg/m   Physical Exam Vitals signs and nursing note reviewed.  Constitutional:      General: She is not in acute distress.    Appearance: She is well-developed and underweight.  HENT:     Head: Normocephalic and atraumatic.  Eyes:     Conjunctiva/sclera: Conjunctivae normal.  Neck:     Musculoskeletal: Neck supple.  Cardiovascular:     Rate and Rhythm: Normal rate and regular rhythm.     Pulses: Normal pulses.     Heart sounds: No murmur.  Pulmonary:     Effort: Pulmonary effort is normal. No respiratory distress.     Breath sounds: Normal breath sounds.  Abdominal:     Palpations: Abdomen is soft.     Tenderness: There is no abdominal tenderness.  Musculoskeletal:     Right lower  leg: No edema.     Left lower leg: No edema.  Skin:    General: Skin is warm and dry.     Capillary Refill: Capillary refill takes less than 2 seconds.  Neurological:     General: No focal deficit present.     Mental Status: She is alert.     Gait: Gait normal.  ED Treatments / Results  Labs (all labs ordered are listed, but only abnormal results are displayed) Labs Reviewed  CBC WITH DIFFERENTIAL/PLATELET - Abnormal; Notable for the following components:      Result Value   RBC 3.79 (*)    Hemoglobin 10.1 (*)    HCT 33.7 (*)    RDW 16.6 (*)    All other components within normal limits  BASIC METABOLIC PANEL - Abnormal; Notable for the following components:   Calcium 10.5 (*)    All other components within normal limits    EKG None  Radiology Ct Chest W Contrast  Result Date: 01/06/2019 CLINICAL DATA:  Hemoptysis for several months. EXAM: CT CHEST WITH CONTRAST TECHNIQUE: Multidetector CT imaging of the chest was performed during intravenous contrast administration. CONTRAST:  62mL OMNIPAQUE IOHEXOL 300 MG/ML  SOLN COMPARISON:  Chest radiographs dated 04/26/2014. Chest CT dated 06/16/2010. FINDINGS: Cardiovascular: Atheromatous calcifications, including the coronary arteries and aorta. Normal sized heart. Mediastinum/Nodes: No enlarged mediastinal, hilar, or axillary lymph nodes. Thyroid gland, trachea, and esophagus demonstrate no significant findings. Lungs/Pleura: Large cavitary mass in the right upper lobe and superior segment right lower lobe, extending across the major fissure. This measures 6.5 x 5.2 cm on image number 45 series 3 and 7.4 cm in length on coronal image number 92 series 5. This has low density fluid or necrotic components posteriorly and superiorly. The lungs are hyperexpanded with diffuse peribronchial thickening. There are multiple small nodules bilaterally. The largest is a cavitary nodule in the anterior aspect of the right middle lobe, measuring 1.3  x 1.1 cm on image number 81 series 3. The next largest is a 0.9 x 0.6 cm nodule in the left upper lobe on image number 72 series 3. No pleural fluid. Mild diffuse bullous changes with a centrilobular pattern. Upper Abdomen: Diffuse low density of the liver relative to the spleen. The liver is mildly heterogeneous with no discrete mass is visualized. Mildly prominent left renal pelvis and upper pole collecting system. The remainder of the right knee kidney is not included and only the superior aspect of the right kidney is included. Enlarged inferior vena cava and portal veins. Musculoskeletal: Postmastectomy changes on the left with a retropectoral implant. No evidence of bony metastatic disease. Midthoracic vertebral compression deformities without acute fracture lines or significant bony retropulsion. Thoracic spine degenerative changes. IMPRESSION: 1. 6.5 x 5.2 x 7.4 cm cavitary mass in the right upper lobe and superior segment right lower lobe, suspicious for a primary lung carcinoma. Fungal cavitary disease is less likely. 2. Multiple small bilateral lung nodules, as described above, suspicious for metastases. 3. Changes of COPD and chronic bronchitis. 4. Mild diffuse hepatic steatosis. 5. Mildly prominent left renal pelvis and upper pole collecting system, possibly indicating mild hydronephrosis. 6. Calcific coronary artery and aortic atherosclerosis. Aortic Atherosclerosis (ICD10-I70.0) and Emphysema (ICD10-J43.9). Electronically Signed   By: Claudie Revering M.D.   On: 01/06/2019 11:27    Procedures Procedures (including critical care time)  Medications Ordered in ED Medications - No data to display   Initial Impression / Assessment and Plan / ED Course  I have reviewed the triage vital signs and the nursing notes.  Pertinent labs & imaging results that were available during my care of the patient were reviewed by me and considered in my medical decision making (see chart for details).  Clinical  Course as of Jan 06 1311  Fri Jan 06, 2019  0935 Differential diagnosis includes TB, pneumonia, cancer, granuloma.    [  MB]  44 CT chest interpreted by me as large cavitary lesion in the right upper lobe.  Awaiting radiology reading.   [MB]  1211 Patient's CAT scan read as likely malignancy.  I placed a call out to the patient's PCP but have not heard back yet.  I discussed the results with the patient and her daughter and they are comfortable with discharge and close follow-up with Dr. Maudie Mercury.  She understands to return if any worsening symptoms.   [MB]    Clinical Course User Index [MB] Hayden Rasmussen, MD   Syliva Overman was evaluated in Emergency Department on 01/06/2019 for the symptoms described in the history of present illness. She was evaluated in the context of the global COVID-19 pandemic, which necessitated consideration that the patient might be at risk for infection with the SARS-CoV-2 virus that causes COVID-19. Institutional protocols and algorithms that pertain to the evaluation of patients at risk for COVID-19 are in a state of rapid change based on information released by regulatory bodies including the CDC and federal and state organizations. These policies and algorithms were followed during the patient's care in the ED.    I was ultimately able to reach Dr. Maudie Mercury and he will arrange follow-up with the patient.   Final Clinical Impressions(s) / ED Diagnoses   Final diagnoses:  Cavitating mass in right upper lung lobe    ED Discharge Orders    None       Hayden Rasmussen, MD 01/06/19 1313

## 2019-01-06 NOTE — Discharge Instructions (Addendum)
You were seen in the emergency department for evaluation of a lung mass noted on chest x-ray.  You had a CAT scan of your chest here that showed a cavitary lung mass that will need follow-up with your primary care doctor and likely referral on to some specialists.  Please contact Dr. Maudie Mercury for follow-up.  Below is the report from the radiologist regarding your CAT scan.     IMPRESSION:  1. 6.5 x 5.2 x 7.4 cm cavitary mass in the right upper lobe and  superior segment right lower lobe, suspicious for a primary lung  carcinoma. Fungal cavitary disease is less likely.  2. Multiple small bilateral lung nodules, as described above,  suspicious for metastases.  3. Changes of COPD and chronic bronchitis.  4. Mild diffuse hepatic steatosis.  5. Mildly prominent left renal pelvis and upper pole collecting  system, possibly indicating mild hydronephrosis.  6. Calcific coronary artery and aortic atherosclerosis.

## 2019-01-06 NOTE — ED Triage Notes (Signed)
Pt sts she had an appt with pmd yesterday and had CXR.  It showed a "cavity" in right upper lung.  Pt sts her pmd sent her to ED for CT chest to f/u.  Pt sts she has been coughing up some blood intermittently for a couple months. Denies pain.

## 2019-01-08 DIAGNOSIS — R918 Other nonspecific abnormal finding of lung field: Secondary | ICD-10-CM | POA: Diagnosis not present

## 2019-01-08 DIAGNOSIS — R042 Hemoptysis: Secondary | ICD-10-CM | POA: Diagnosis not present

## 2019-01-08 DIAGNOSIS — D649 Anemia, unspecified: Secondary | ICD-10-CM | POA: Diagnosis not present

## 2019-01-08 DIAGNOSIS — Z79899 Other long term (current) drug therapy: Secondary | ICD-10-CM | POA: Diagnosis not present

## 2019-01-10 ENCOUNTER — Encounter: Payer: Self-pay | Admitting: *Deleted

## 2019-01-10 ENCOUNTER — Telehealth: Payer: Self-pay | Admitting: Internal Medicine

## 2019-01-10 DIAGNOSIS — R918 Other nonspecific abnormal finding of lung field: Secondary | ICD-10-CM | POA: Diagnosis not present

## 2019-01-10 NOTE — Telephone Encounter (Signed)
Confirmed 11/4 lab/new patient appointment with patient.

## 2019-01-10 NOTE — Progress Notes (Signed)
Oncology Nurse Navigator Documentation  Oncology Nurse Navigator Flowsheets 01/10/2019  Navigator Location [facility]  Referral Date to RadOnc/MedOnc 01/09/2019  Navigator Encounter Type Other/I received referral on Ms. Therriault yesterday.  I updated Dr. Julien Nordmann and he would like to see her tomorrow.  I updated scheduling team to call and schedule.    Barriers/Navigation Needs Coordination of Care  Interventions Coordination of Care  Acuity Level 2-Minimal Needs (1-2 Barriers Identified)  Coordination of Care Other  Time Spent with Patient 30

## 2019-01-11 ENCOUNTER — Inpatient Hospital Stay: Payer: Medicare Other | Attending: Internal Medicine | Admitting: Internal Medicine

## 2019-01-11 ENCOUNTER — Other Ambulatory Visit: Payer: Self-pay

## 2019-01-11 ENCOUNTER — Encounter: Payer: Self-pay | Admitting: Internal Medicine

## 2019-01-11 ENCOUNTER — Inpatient Hospital Stay: Payer: Medicare Other

## 2019-01-11 VITALS — BP 110/57 | HR 81 | Temp 98.7°F | Resp 17 | Ht 61.0 in | Wt 90.3 lb

## 2019-01-11 DIAGNOSIS — R918 Other nonspecific abnormal finding of lung field: Secondary | ICD-10-CM

## 2019-01-11 DIAGNOSIS — E213 Hyperparathyroidism, unspecified: Secondary | ICD-10-CM

## 2019-01-11 DIAGNOSIS — Z808 Family history of malignant neoplasm of other organs or systems: Secondary | ICD-10-CM | POA: Diagnosis not present

## 2019-01-11 DIAGNOSIS — Z853 Personal history of malignant neoplasm of breast: Secondary | ICD-10-CM | POA: Insufficient documentation

## 2019-01-11 DIAGNOSIS — R911 Solitary pulmonary nodule: Secondary | ICD-10-CM | POA: Insufficient documentation

## 2019-01-11 DIAGNOSIS — Z9221 Personal history of antineoplastic chemotherapy: Secondary | ICD-10-CM

## 2019-01-11 DIAGNOSIS — M810 Age-related osteoporosis without current pathological fracture: Secondary | ICD-10-CM

## 2019-01-11 DIAGNOSIS — E559 Vitamin D deficiency, unspecified: Secondary | ICD-10-CM

## 2019-01-11 DIAGNOSIS — F17210 Nicotine dependence, cigarettes, uncomplicated: Secondary | ICD-10-CM | POA: Diagnosis not present

## 2019-01-11 DIAGNOSIS — Z800 Family history of malignant neoplasm of digestive organs: Secondary | ICD-10-CM | POA: Diagnosis not present

## 2019-01-11 DIAGNOSIS — M069 Rheumatoid arthritis, unspecified: Secondary | ICD-10-CM | POA: Insufficient documentation

## 2019-01-11 DIAGNOSIS — Z8051 Family history of malignant neoplasm of kidney: Secondary | ICD-10-CM | POA: Diagnosis not present

## 2019-01-11 DIAGNOSIS — J449 Chronic obstructive pulmonary disease, unspecified: Secondary | ICD-10-CM | POA: Diagnosis not present

## 2019-01-11 LAB — CBC WITH DIFFERENTIAL (CANCER CENTER ONLY)
Abs Immature Granulocytes: 0.03 10*3/uL (ref 0.00–0.07)
Basophils Absolute: 0.1 10*3/uL (ref 0.0–0.1)
Basophils Relative: 1 %
Eosinophils Absolute: 0.4 10*3/uL (ref 0.0–0.5)
Eosinophils Relative: 5 %
HCT: 32.9 % — ABNORMAL LOW (ref 36.0–46.0)
Hemoglobin: 10 g/dL — ABNORMAL LOW (ref 12.0–15.0)
Immature Granulocytes: 0 %
Lymphocytes Relative: 34 %
Lymphs Abs: 2.7 10*3/uL (ref 0.7–4.0)
MCH: 27 pg (ref 26.0–34.0)
MCHC: 30.4 g/dL (ref 30.0–36.0)
MCV: 88.7 fL (ref 80.0–100.0)
Monocytes Absolute: 0.7 10*3/uL (ref 0.1–1.0)
Monocytes Relative: 9 %
Neutro Abs: 4.1 10*3/uL (ref 1.7–7.7)
Neutrophils Relative %: 51 %
Platelet Count: 317 10*3/uL (ref 150–400)
RBC: 3.71 MIL/uL — ABNORMAL LOW (ref 3.87–5.11)
RDW: 16.5 % — ABNORMAL HIGH (ref 11.5–15.5)
WBC Count: 7.9 10*3/uL (ref 4.0–10.5)
nRBC: 0 % (ref 0.0–0.2)

## 2019-01-11 LAB — CMP (CANCER CENTER ONLY)
ALT: <6 U/L (ref 0–44)
AST: 15 U/L (ref 15–41)
Albumin: 2.9 g/dL — ABNORMAL LOW (ref 3.5–5.0)
Alkaline Phosphatase: 59 U/L (ref 38–126)
Anion gap: 10 (ref 5–15)
BUN: 9 mg/dL (ref 8–23)
CO2: 27 mmol/L (ref 22–32)
Calcium: 10.2 mg/dL (ref 8.9–10.3)
Chloride: 101 mmol/L (ref 98–111)
Creatinine: 0.72 mg/dL (ref 0.44–1.00)
GFR, Est AFR Am: >60 mL/min (ref >60)
GFR, Estimated: >60 mL/min (ref >60)
Glucose, Bld: 80 mg/dL (ref 70–99)
Potassium: 4.1 mmol/L (ref 3.5–5.1)
Sodium: 138 mmol/L (ref 135–145)
Total Bilirubin: 0.3 mg/dL (ref 0.3–1.2)
Total Protein: 6.8 g/dL (ref 6.5–8.1)

## 2019-01-11 NOTE — Progress Notes (Signed)
[place] [phone]   [phone]  CONSULT NOTE  REFERRING PHYSICIAN: Dr. Aletta Edouard  REASON FOR CONSULTATION:  78 years old white female with suspicious lung cancer.  HPI Olivia Hooper is a 78 y.o. female with past medical history significant for left breast cancer status post resection followed by systemic chemotherapy 28 years ago.  The patient also has a history of COPD, hypercalcemia, dyslipidemia, osteoporosis, rheumatoid arthritis, vitamin D deficiency as well as hyperparathyroidism and long history for smoking.  The patient mentions that more than 4 weeks ago she was having cough and spitting some blood.  She was seen by her primary care physician and had chest x-ray performed that showed suspicious lesion in the right lung.  This was followed by CT scan of the chest with contrast on January 06, 2019 and that showed large cavitary mass in the right upper lobe and superior segment right lower lobe extending across the major fissure.  This measured 6.5 x 5.2 x 7.4 cm.  It has a low-density fluid or necrotic component posteriorly and superiorly.  There are multiple small nodules bilaterally the largest is a cavitary nodule in the anterior aspect of the right middle lobe measuring 1.3 x 1.1 cm and the next largest is 0.9 x 0.6 cm in the left upper lobe. The patient was referred to me today for evaluation and recommendation regarding her condition. When seen today she is feeling fine with no concerning complaints except for the symptoms of the rheumatoid arthritis.  She denied having any current chest pain or shortness of breath but has cough especially in the morning with no more hemoptysis.  She denied having any recent weight loss or night sweats.  She has no nausea, vomiting, diarrhea or constipation.  She denied having any headache or visual changes. Family history significant for father with bone cancer and heart disease, sister had melanoma,  brother had kidney cancer and mother had colon cancer and died from heart disease at age 64. The patient is a widow and has 4 children.  She used to work in [employer] research (medical).  She was accompanied today by her daughter Angela Nevin.  The patient has a history of smoking 1 pack/day for around 40 years and she is still smoking.  She has no history of alcohol or drug abuse.  HPI  Past Medical History:  Diagnosis Date   Breast cancer ([place])    Carpal tunnel syndrome    COPD (chronic obstructive pulmonary disease) ([facility])    Hypercalcemia    Hyperlipidemia    Hyperparathyroidism ([facility])    Osteoporosis    Pneumonia    RA (rheumatoid arthritis) ([place])    Vertigo    Vitamin D deficiency     Past Surgical History:  Procedure Laterality Date   CARPAL TUNNEL RELEASE     CATARACT EXTRACTION     MASTECTOMY Left    VESICOVAGINAL FISTULA CLOSURE W/ TAH      Family History  Problem Relation Age of Onset   Heart disease Father    Bone cancer Father    Emphysema Father        never smoker   Heart disease Mother    Colon cancer Mother    Melanoma Sister    Kidney cancer Brother     Social History Social History   Tobacco Use   Smoking status: Current Some Day Smoker    Packs/day: 1.00    Years: 62.00    Pack years: 62.00  Types: Cigarettes   Smokeless tobacco: Never Used   Tobacco comment: cut back to 1 cig per day 02/06/14-lmr  Substance Use Topics   Alcohol use: No    Alcohol/week: 0.0 standard drinks   Drug use: No    Allergies  Allergen Reactions   Buprenorphine Hcl Nausea And Vomiting   Morphine And Related Nausea And Vomiting         Current Outpatient Medications  Medication Sig Dispense Refill   albuterol (VENTOLIN HFA) 108 (90 Base) MCG/ACT inhaler Inhale into the lungs every 6 (six) hours as needed for wheezing or shortness of breath.     denosumab (PROLIA) 60 MG/ML SOSY injection Inject 60 mg into the skin every 6 (six) months.     folic acid  (FOLVITE) 1 MG tablet Take 3 mg by mouth daily.      methotrexate 2.5 MG tablet      mometasone-formoterol (DULERA) 100-5 MCG/ACT AERO Inhale 2 puffs into the lungs 2 (two) times daily.     simvastatin (ZOCOR) 40 MG tablet      traMADol (ULTRAM) 50 MG tablet Take by mouth every 6 (six) hours as needed.     triamcinolone ointment (KENALOG) 0.1 % Apply 1 application topically 2 (two) times daily.     No current facility-administered medications for this visit.     Review of Systems  Constitutional: positive for fatigue Eyes: negative Ears, nose, mouth, throat, and face: negative Respiratory: positive for cough Cardiovascular: negative Gastrointestinal: negative Genitourinary:negative Integument/breast: negative Hematologic/lymphatic: negative Musculoskeletal:positive for arthralgias Neurological: negative Behavioral/Psych: negative Endocrine: negative Allergic/Immunologic: negative  Physical Exam  [id number], healthy, no distress, well nourished and well developed SKIN: skin color, texture, turgor are normal, no rashes or significant lesions HEAD: Normocephalic, No masses, lesions, tenderness or abnormalities EYES: normal, PERRLA, Conjunctiva are pink and non-injected EARS: External ears normal, Canals clear OROPHARYNX:no exudate, no erythema and lips, buccal mucosa, and tongue normal  NECK: supple, no adenopathy, no JVD LYMPH:  no palpable lymphadenopathy, no hepatosplenomegaly BREAST:not examined LUNGS: clear to auscultation , and palpation HEART: regular rate & rhythm, no murmurs and no gallops ABDOMEN:abdomen soft, non-tender, normal bowel sounds and no masses or organomegaly BACK: No CVA tenderness, Range of motion is normal EXTREMITIES:no joint deformities, effusion, or inflammation, no edema  NEURO: alert & oriented x 3 with fluent speech, no focal motor/sensory deficits  PERFORMANCE STATUS: ECOG 1  LABORATORY DATA: Lab Results  Component Value Date   WBC  7.9 01/11/2019   HGB 10.0 (L) 01/11/2019   HCT 32.9 (L) 01/11/2019   MCV 88.7 01/11/2019   PLT 317 01/11/2019      Chemistry      Component Value Date/Time   NA 138 01/06/2019 0920   K 3.9 01/06/2019 0920   CL 101 01/06/2019 0920   CO2 28 01/06/2019 0920   BUN 10 01/06/2019 0920   CREATININE 0.72 01/06/2019 0920      Component Value Date/Time   CALCIUM 10.5 (H) 01/06/2019 0920       RADIOGRAPHIC STUDIES: Ct Chest W Contrast  Result Date: 01/06/2019 CLINICAL DATA:  Hemoptysis for several months. EXAM: CT CHEST WITH CONTRAST TECHNIQUE: Multidetector CT imaging of the chest was performed during intravenous contrast administration. CONTRAST:  40mL OMNIPAQUE IOHEXOL 300 MG/ML  SOLN COMPARISON:  Chest radiographs dated 04/26/2014. Chest CT dated 06/16/2010. FINDINGS: Cardiovascular: Atheromatous calcifications, including the coronary arteries and aorta. Normal sized heart. Mediastinum/Nodes: No enlarged mediastinal, hilar, or axillary lymph nodes. Thyroid gland, trachea, and esophagus demonstrate  no significant findings. Lungs/Pleura: Large cavitary mass in the right upper lobe and superior segment right lower lobe, extending across the major fissure. This measures 6.5 x 5.2 cm on image number 45 series 3 and 7.4 cm in length on coronal image number 92 series 5. This has low density fluid or necrotic components posteriorly and superiorly. The lungs are hyperexpanded with diffuse peribronchial thickening. There are multiple small nodules bilaterally. The largest is a cavitary nodule in the anterior aspect of the right middle lobe, measuring 1.3 x 1.1 cm on image number 81 series 3. The next largest is a 0.9 x 0.6 cm nodule in the left upper lobe on image number 72 series 3. No pleural fluid. Mild diffuse bullous changes with a centrilobular pattern. Upper Abdomen: Diffuse low density of the liver relative to the spleen. The liver is mildly heterogeneous with no discrete mass is visualized. Mildly  prominent left renal pelvis and upper pole collecting system. The remainder of the right knee kidney is not included and only the superior aspect of the right kidney is included. Enlarged inferior vena cava and portal veins. Musculoskeletal: Postmastectomy changes on the left with a retropectoral implant. No evidence of bony metastatic disease. Midthoracic vertebral compression deformities without acute fracture lines or significant bony retropulsion. Thoracic spine degenerative changes. IMPRESSION: 1. 6.5 x 5.2 x 7.4 cm cavitary mass in the right upper lobe and superior segment right lower lobe, suspicious for a primary lung carcinoma. Fungal cavitary disease is less likely. 2. Multiple small bilateral lung nodules, as described above, suspicious for metastases. 3. Changes of COPD and chronic bronchitis. 4. Mild diffuse hepatic steatosis. 5. Mildly prominent left renal pelvis and upper pole collecting system, possibly indicating mild hydronephrosis. 6. Calcific coronary artery and aortic atherosclerosis. Aortic Atherosclerosis (ICD10-I70.0) and Emphysema (ICD10-J43.9). Electronically Signed   By: Claudie Revering M.D.   On: 01/06/2019 11:27    ASSESSMENT: This is a very pleasant 78 years old white female with suspicious right upper lobe as well as superior segment right lower lobe mass concerning for primary bronchogenic carcinoma.  The patient also has multiple small bilateral pulmonary nodules.   PLAN: I had a lengthy discussion with the patient and her daughter today about her current condition and further investigation to confirm her diagnosis. I recommended for the patient to have a PET scan for further evaluation of this lesion. I will also refer her to pulmonary medicine f or consideration of bronchoscopy and tissue diagnosis.  I personally spoke to Dr. Valeta Harms and discussed the case with him. I will arrange for the patient to come back for follow-up visit in 2 weeks for reevaluation and discussion of her  treatment options based on the staging work-up as well as the final pathology diagnosis. I also encouraged the patient to quit smoking. The patient was advised to call immediately if she has any concerning symptoms in the interval. The patient voices understanding of current disease status and treatment options and is in agreement with the current care plan.  All questions were answered. The patient knows to call the clinic with any problems, questions or concerns. We can certainly see the patient much sooner if necessary.  Thank you so much for allowing me to participate in the care of Liliahna Beumer. I will continue to follow up the patient with you and assist in her care.  I spent 40 minutes counseling the patient face to face. The total time spent in the appointment was 60 minutes.  Disclaimer: This  note was dictated with voice recognition software. Similar sounding words can inadvertently be transcribed and may not be corrected upon review.   Eilleen Kempf January 11, 2019, 1:31 PM

## 2019-01-12 ENCOUNTER — Telehealth: Payer: Self-pay | Admitting: Internal Medicine

## 2019-01-12 NOTE — Progress Notes (Signed)
Synopsis: Referred in Nov 2020 for lung mass by Curt Bears, MD  Subjective:   PATIENT ID: Olivia Hooper GENDER: female DOB: 04-Nov-1940, MRN: [id number]  Chief Complaint  Patient presents with  . Consult    Lung mass patient stated breathing is ok .    HPI 78 year old woman with long history of smoking (40 pack years ongoing), RA, COPD on dulera,   distant history of breast cancer post resection and chemo presenting for evaluation of lung mass found during workup for hemoptysis.  Has had nagging cough x 3 months, initially yellow then pink tinged and grey, thick.  Workup revealed large RUL cavitary lesion prompting referral to pulmonology.  Has advanced RA on MTX.  Also osteoporosis on prolia.    PFTs 2016 showing moderate obstruction without BD response, post BD FEV1 1.4L (71% pred).  Lung volumes WNL, DLCO mild to moderately reduced.  MMRC 1.  Denies chest pains, palpitations, weight loss, fatigue.  Has longstanding DJD of spine.  Full ROS as below.  ECOG 1 per her report, daughter is not so sure   ROS Positive Symptoms in bold:  Constitutional fevers, chills, weight loss, fatigue, anorexia, malaise  Eyes decreased vision, double vision, eye irritation  Ears, Nose, Mouth, Throat sore throat, trouble swallowing, sinus congestion  Cardiovascular chest pain, paroxysmal nocturnal dyspnea, lower ext edema, palpitations   Respiratory SOB, cough, DOE, hemoptysis, wheezing  Gastrointestinal nausea, vomiting, diarrhea  Genitourinary burning with urination, trouble urinating  Musculoskeletal joint aches, joint swelling, back pain  Integumentary  rashes, skin lesions  Neurological focal weakness, focal numbness, trouble speaking, headaches  Psychiatric depression, anxiety, confusion  Endocrine polyuria, polydipsia, cold intolerance, heat intolerance  Hematologic abnormal bruising, abnormal bleeding, unexplained nose bleeds  Allergic/Immunologic recurrent infections, hives,  swollen lymph nodes    Past Medical History:  Diagnosis Date  . Breast cancer ([place])   . Carpal tunnel syndrome   . COPD (chronic obstructive pulmonary disease) ([place])   . Hypercalcemia   . Hyperlipidemia   . Hyperparathyroidism ([place])   . Osteoporosis   . Pneumonia   . RA (rheumatoid arthritis) ([place])   . Vertigo   . Vitamin D deficiency      Family History  Problem Relation Age of Onset  . Heart disease Father   . Bone cancer Father   . Emphysema Father        never smoker  . Heart disease Mother   . Colon cancer Mother   . Melanoma Sister   . Kidney cancer Brother      Past Surgical History:  Procedure Laterality Date  . CARPAL TUNNEL RELEASE    . CATARACT EXTRACTION    . MASTECTOMY Left   . VESICOVAGINAL FISTULA CLOSURE W/ TAH      Social History   Socioeconomic History  . Marital status: Widowed    Spouse name: Not on file  . Number of children: Not on file  . Years of education: Not on file  . Highest education level: Not on file  Occupational History  . Occupation: Retired   Scientific laboratory technician  . Financial resource strain: Not on file  . Food insecurity    Worry: Not on file    Inability: Not on file  . Transportation needs    Medical: Not on file    Non-medical: Not on file  Tobacco Use  . Smoking status: Light Tobacco Smoker    Packs/day: 1.00    Years: 62.00    Pack years:  62.00    Types: Cigarettes  . Smokeless tobacco: Never Used  . Tobacco comment: cut back to 1 cig per day 02/06/14-lmr  Substance and Sexual Activity  . Alcohol use: No    Alcohol/week: 0.0 standard drinks  . Drug use: No  . Sexual activity: Not on file  Lifestyle  . Physical activity    Days per week: Not on file    Minutes per session: Not on file  . Stress: Not on file  Relationships  . Social Herbalist on phone: Not on file    Gets together: Not on file    Attends religious service: Not on file    Active member of club or organization: Not on file     Attends meetings of clubs or organizations: Not on file    Relationship status: Not on file  . Intimate partner violence    Fear of current or ex partner: Not on file    Emotionally abused: Not on file    Physically abused: Not on file    Forced sexual activity: Not on file  Other Topics Concern  . Not on file  Social History Narrative  . Not on file     Allergies  Allergen Reactions  . Buprenorphine Hcl Nausea And Vomiting  . Morphine And Related Nausea And Vomiting          Outpatient Medications Prior to Visit  Medication Sig Dispense Refill  . albuterol (VENTOLIN HFA) 108 (90 Base) MCG/ACT inhaler Inhale into the lungs every 6 (six) hours as needed for wheezing or shortness of breath.    . denosumab (PROLIA) 60 MG/ML SOSY injection Inject 60 mg into the skin every 6 (six) months.    . folic acid (FOLVITE) 1 MG tablet Take 3 mg by mouth daily.     . methotrexate 2.5 MG tablet     . mometasone-formoterol (DULERA) 100-5 MCG/ACT AERO Inhale 2 puffs into the lungs 2 (two) times daily.    . simvastatin (ZOCOR) 40 MG tablet     . traMADol (ULTRAM) 50 MG tablet Take by mouth every 6 (six) hours as needed.    . triamcinolone ointment (KENALOG) 0.1 % Apply 1 application topically 2 (two) times daily.     No facility-administered medications prior to visit.      Objective:  GEN: very thin woman in NAD HEENT: MMM, no thrush, full ROM of neck CV:  RRR, ext warm PULM: Scattered wheezing, no accessory muscle use GI: Soft, +BS EXT: advanced RA changes of hands with ulnar deviation, synovitis.  Thenar and hypothenar  NEURO: moves all 4 ext to command, ambulates without assistance PSYCH: AOx3, fair insight SKIN: No rashes    Vitals:   01/13/19 0930 01/13/19 0932  BP:  132/70  Pulse:  87  Temp:  (!) 97.2 F (36.2 C)  TempSrc:  Temporal  SpO2:  94%  Weight: 89 lb 14.4 oz (40.8 kg) 89 lb 15.4 oz (40.8 kg)  Height: 5' 1.5" (1.562 m) 5' 1.5" (1.562 m)   94% on RA BMI  Readings from Last 3 Encounters:  01/13/19 16.72 kg/m  01/11/19 17.06 kg/m  01/06/19 16.56 kg/m   Wt Readings from Last 3 Encounters:  01/13/19 89 lb 15.4 oz (40.8 kg)  01/11/19 90 lb 4.8 oz (41 kg)  01/06/19 89 lb 1.6 oz (40.4 kg)     CBC    Component Value Date/Time   WBC 7.9 01/11/2019 1314   WBC 7.6 01/06/2019  0920   RBC 3.71 (L) 01/11/2019 1314   HGB 10.0 (L) 01/11/2019 1314   HCT 32.9 (L) 01/11/2019 1314   PLT 317 01/11/2019 1314   MCV 88.7 01/11/2019 1314   MCH 27.0 01/11/2019 1314   MCHC 30.4 01/11/2019 1314   RDW 16.5 (H) 01/11/2019 1314   LYMPHSABS 2.7 01/11/2019 1314   MONOABS 0.7 01/11/2019 1314   EOSABS 0.4 01/11/2019 1314   BASOSABS 0.1 01/11/2019 1314    Chest Imaging: Cavitary RUL mass, no obvious adenopathy, advanced emphysematous changes  Pulmonary Functions Testing Results: PFT Results Latest Ref Rng & Units 08/07/2014  FVC-Pre L 2.26  FVC-Predicted Pre % 87  FVC-Post L 2.22  FVC-Predicted Post % 86  Pre FEV1/FVC % % 63  Post FEV1/FCV % % 61  FEV1-Pre L 1.41  FEV1-Predicted Pre % 73  FEV1-Post L 1.37  DLCO UNC% % 62  DLCO COR %Predicted % 69   MRI neck 2017 IMPRESSION: 1. 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 2. Otherwise negative MRI appearance of the neck soft tissues. 3. Acute on chronic facet degeneration on the right at C3-C4. Query recent right neck pain. Superimposed widespread cervical disc and endplate degeneration.    Assessment & Plan:   # RUL lung mass in patient with longstanding smoking history, distant hx of breast cancer, and RA on immunosuppressant warrants tissue biopsy and culture. Given appearance and high chance of bleeding would prefer to do with a controlled airway with GA.  Can also used EBUS to evaluate and biopsy local nodes for involvement. #  Active smoker working on quitting interested in NRT # COPD on dulera, FEV1 1.4L (73% pred) back in 2016 # Frail elderly, I do not really think she is a surgical candidate based on clinical gestalt  # Advanced RA on MTX  Discussion: - Flexion extension C spine films for signs of atlantoaxial instability, no signs of this on MRI in 2017 but since needs GA would prefer to be on safe side - Bronch 01/18/15 with EBUS/fluoro, discussed risks and benefits of this at length with patient and daughter - Repeat PFTs - PET scheduled later this month - Will coordinate with Dr. Julien Nordmann regarding follow-up care - 5 minutes smoking cessation counseling, nicotine patch Rx provided   Current Outpatient Medications:  .  albuterol (VENTOLIN HFA) 108 (90 Base) MCG/ACT inhaler, Inhale into the lungs every 6 (six) hours as needed for wheezing or shortness of breath., Disp: , Rfl:  .  denosumab (PROLIA) 60 MG/ML SOSY injection, Inject 60 mg into the skin every 6 (six) months., Disp: , Rfl:  .  folic acid (FOLVITE) 1 MG tablet, Take 3 mg by mouth daily. , Disp: , Rfl:  .  methotrexate 2.5 MG tablet, , Disp: , Rfl:  .  mometasone-formoterol (DULERA) 100-5 MCG/ACT AERO, Inhale 2 puffs into the lungs 2 (two) times daily., Disp: , Rfl:  .  simvastatin (ZOCOR) 40 MG tablet, , Disp: , Rfl:  .  traMADol (ULTRAM) 50 MG tablet, Take by mouth every 6 (six) hours as needed., Disp: , Rfl:  .  triamcinolone ointment (KENALOG) 0.1 %, Apply 1 application topically 2 (two) times daily., Disp: , Rfl:    Candee Furbish, MD [facility] 01/13/2019 9:50 AM

## 2019-01-12 NOTE — H&P (View-Only) (Signed)
Synopsis: Referred in Nov 2020 for lung mass by Curt Bears, MD  Subjective:   PATIENT ID: Olivia Hooper GENDER: female DOB: 05/23/1940, MRN: [id number]  Chief Complaint  Patient presents with  . Consult    Lung mass patient stated breathing is ok .    HPI 78 year old woman with long history of smoking (40 pack years ongoing), RA, COPD on dulera,   distant history of breast cancer post resection and chemo presenting for evaluation of lung mass found during workup for hemoptysis.  Has had nagging cough x 3 months, initially yellow then pink tinged and grey, thick.  Workup revealed large RUL cavitary lesion prompting referral to pulmonology.  Has advanced RA on MTX.  Also osteoporosis on prolia.    PFTs 2016 showing moderate obstruction without BD response, post BD FEV1 1.4L (71% pred).  Lung volumes WNL, DLCO mild to moderately reduced.  MMRC 1.  Denies chest pains, palpitations, weight loss, fatigue.  Has longstanding DJD of spine.  Full ROS as below.  ECOG 1 per her report, daughter is not so sure   ROS Positive Symptoms in bold:  Constitutional fevers, chills, weight loss, fatigue, anorexia, malaise  Eyes decreased vision, double vision, eye irritation  Ears, Nose, Mouth, Throat sore throat, trouble swallowing, sinus congestion  Cardiovascular chest pain, paroxysmal nocturnal dyspnea, lower ext edema, palpitations   Respiratory SOB, cough, DOE, hemoptysis, wheezing  Gastrointestinal nausea, vomiting, diarrhea  Genitourinary burning with urination, trouble urinating  Musculoskeletal joint aches, joint swelling, back pain  Integumentary  rashes, skin lesions  Neurological focal weakness, focal numbness, trouble speaking, headaches  Psychiatric depression, anxiety, confusion  Endocrine polyuria, polydipsia, cold intolerance, heat intolerance  Hematologic abnormal bruising, abnormal bleeding, unexplained nose bleeds  Allergic/Immunologic recurrent infections, hives,  swollen lymph nodes    Past Medical History:  Diagnosis Date  . Breast cancer ([place])   . Carpal tunnel syndrome   . COPD (chronic obstructive pulmonary disease) ([place])   . Hypercalcemia   . Hyperlipidemia   . Hyperparathyroidism ([place])   . Osteoporosis   . Pneumonia   . RA (rheumatoid arthritis) ([place])   . Vertigo   . Vitamin D deficiency      Family History  Problem Relation Age of Onset  . Heart disease Father   . Bone cancer Father   . Emphysema Father        never smoker  . Heart disease Mother   . Colon cancer Mother   . Melanoma Sister   . Kidney cancer Brother      Past Surgical History:  Procedure Laterality Date  . CARPAL TUNNEL RELEASE    . CATARACT EXTRACTION    . MASTECTOMY Left   . VESICOVAGINAL FISTULA CLOSURE W/ TAH      Social History   Socioeconomic History  . Marital status: Widowed    Spouse name: Not on file  . Number of children: Not on file  . Years of education: Not on file  . Highest education level: Not on file  Occupational History  . Occupation: Retired   Scientific laboratory technician  . Financial resource strain: Not on file  . Food insecurity    Worry: Not on file    Inability: Not on file  . Transportation needs    Medical: Not on file    Non-medical: Not on file  Tobacco Use  . Smoking status: Light Tobacco Smoker    Packs/day: 1.00    Years: 62.00    Pack years:  62.00    Types: Cigarettes  . Smokeless tobacco: Never Used  . Tobacco comment: cut back to 1 cig per day 02/06/14-lmr  Substance and Sexual Activity  . Alcohol use: No    Alcohol/week: 0.0 standard drinks  . Drug use: No  . Sexual activity: Not on file  Lifestyle  . Physical activity    Days per week: Not on file    Minutes per session: Not on file  . Stress: Not on file  Relationships  . Social Herbalist on phone: Not on file    Gets together: Not on file    Attends religious service: Not on file    Active member of club or organization: Not on file     Attends meetings of clubs or organizations: Not on file    Relationship status: Not on file  . Intimate partner violence    Fear of current or ex partner: Not on file    Emotionally abused: Not on file    Physically abused: Not on file    Forced sexual activity: Not on file  Other Topics Concern  . Not on file  Social History Narrative  . Not on file     Allergies  Allergen Reactions  . Buprenorphine Hcl Nausea And Vomiting  . Morphine And Related Nausea And Vomiting          Outpatient Medications Prior to Visit  Medication Sig Dispense Refill  . albuterol (VENTOLIN HFA) 108 (90 Base) MCG/ACT inhaler Inhale into the lungs every 6 (six) hours as needed for wheezing or shortness of breath.    . denosumab (PROLIA) 60 MG/ML SOSY injection Inject 60 mg into the skin every 6 (six) months.    . folic acid (FOLVITE) 1 MG tablet Take 3 mg by mouth daily.     . methotrexate 2.5 MG tablet     . mometasone-formoterol (DULERA) 100-5 MCG/ACT AERO Inhale 2 puffs into the lungs 2 (two) times daily.    . simvastatin (ZOCOR) 40 MG tablet     . traMADol (ULTRAM) 50 MG tablet Take by mouth every 6 (six) hours as needed.    . triamcinolone ointment (KENALOG) 0.1 % Apply 1 application topically 2 (two) times daily.     No facility-administered medications prior to visit.      Objective:  GEN: very thin woman in NAD HEENT: MMM, no thrush, full ROM of neck CV:  RRR, ext warm PULM: Scattered wheezing, no accessory muscle use GI: Soft, +BS EXT: advanced RA changes of hands with ulnar deviation, synovitis.  Thenar and hypothenar  NEURO: moves all 4 ext to command, ambulates without assistance PSYCH: AOx3, fair insight SKIN: No rashes    Vitals:   01/13/19 0930 01/13/19 0932  BP:  132/70  Pulse:  87  Temp:  (!) 97.2 F (36.2 C)  TempSrc:  Temporal  SpO2:  94%  Weight: 89 lb 14.4 oz (40.8 kg) 89 lb 15.4 oz (40.8 kg)  Height: 5' 1.5" (1.562 m) 5' 1.5" (1.562 m)   94% on RA BMI  Readings from Last 3 Encounters:  01/13/19 16.72 kg/m  01/11/19 17.06 kg/m  01/06/19 16.56 kg/m   Wt Readings from Last 3 Encounters:  01/13/19 89 lb 15.4 oz (40.8 kg)  01/11/19 90 lb 4.8 oz (41 kg)  01/06/19 89 lb 1.6 oz (40.4 kg)     CBC    Component Value Date/Time   WBC 7.9 01/11/2019 1314   WBC 7.6 01/06/2019  0920   RBC 3.71 (L) 01/11/2019 1314   HGB 10.0 (L) 01/11/2019 1314   HCT 32.9 (L) 01/11/2019 1314   PLT 317 01/11/2019 1314   MCV 88.7 01/11/2019 1314   MCH 27.0 01/11/2019 1314   MCHC 30.4 01/11/2019 1314   RDW 16.5 (H) 01/11/2019 1314   LYMPHSABS 2.7 01/11/2019 1314   MONOABS 0.7 01/11/2019 1314   EOSABS 0.4 01/11/2019 1314   BASOSABS 0.1 01/11/2019 1314    Chest Imaging: Cavitary RUL mass, no obvious adenopathy, advanced emphysematous changes  Pulmonary Functions Testing Results: PFT Results Latest Ref Rng & Units 08/07/2014  FVC-Pre L 2.26  FVC-Predicted Pre % 87  FVC-Post L 2.22  FVC-Predicted Post % 86  Pre FEV1/FVC % % 63  Post FEV1/FCV % % 61  FEV1-Pre L 1.41  FEV1-Predicted Pre % 73  FEV1-Post L 1.37  DLCO UNC% % 62  DLCO COR %Predicted % 69   MRI neck 2017 IMPRESSION: 1. 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 2. Otherwise negative MRI appearance of the neck soft tissues. 3. Acute on chronic facet degeneration on the right at C3-C4. Query recent right neck pain. Superimposed widespread cervical disc and endplate degeneration.    Assessment & Plan:   # RUL lung mass in patient with longstanding smoking history, distant hx of breast cancer, and RA on immunosuppressant warrants tissue biopsy and culture. Given appearance and high chance of bleeding would prefer to do with a controlled airway with GA.  Can also used EBUS to evaluate and biopsy local nodes for involvement. #  Active smoker working on quitting interested in NRT # COPD on dulera, FEV1 1.4L (73% pred) back in 2016 # Frail elderly, I do not really think she is a surgical candidate based on clinical gestalt  # Advanced RA on MTX  Discussion: - Flexion extension C spine films for signs of atlantoaxial instability, no signs of this on MRI in 2017 but since needs GA would prefer to be on safe side - Bronch 01/18/15 with EBUS/fluoro, discussed risks and benefits of this at length with patient and daughter - Repeat PFTs - PET scheduled later this month - Will coordinate with Dr. Julien Nordmann regarding follow-up care - 5 minutes smoking cessation counseling, nicotine patch Rx provided   Current Outpatient Medications:  .  albuterol (VENTOLIN HFA) 108 (90 Base) MCG/ACT inhaler, Inhale into the lungs every 6 (six) hours as needed for wheezing or shortness of breath., Disp: , Rfl:  .  denosumab (PROLIA) 60 MG/ML SOSY injection, Inject 60 mg into the skin every 6 (six) months., Disp: , Rfl:  .  folic acid (FOLVITE) 1 MG tablet, Take 3 mg by mouth daily. , Disp: , Rfl:  .  methotrexate 2.5 MG tablet, , Disp: , Rfl:  .  mometasone-formoterol (DULERA) 100-5 MCG/ACT AERO, Inhale 2 puffs into the lungs 2 (two) times daily., Disp: , Rfl:  .  simvastatin (ZOCOR) 40 MG tablet, , Disp: , Rfl:  .  traMADol (ULTRAM) 50 MG tablet, Take by mouth every 6 (six) hours as needed., Disp: , Rfl:  .  triamcinolone ointment (KENALOG) 0.1 %, Apply 1 application topically 2 (two) times daily., Disp: , Rfl:    Candee Furbish, MD [facility] 01/13/2019 9:50 AM

## 2019-01-12 NOTE — Telephone Encounter (Signed)
Scheduled per los. Called and spoke with patient. Confirmed appt 

## 2019-01-13 ENCOUNTER — Other Ambulatory Visit: Payer: Self-pay | Admitting: Internal Medicine

## 2019-01-13 ENCOUNTER — Ambulatory Visit (INDEPENDENT_AMBULATORY_CARE_PROVIDER_SITE_OTHER)
Admission: RE | Admit: 2019-01-13 | Discharge: 2019-01-13 | Disposition: A | Discharge status: Home / Self Care | Payer: Medicare Other | Source: Ambulatory Visit | Attending: Internal Medicine | Admitting: Internal Medicine

## 2019-01-13 ENCOUNTER — Other Ambulatory Visit: Payer: Self-pay | Admitting: *Deleted

## 2019-01-13 ENCOUNTER — Other Ambulatory Visit: Payer: Self-pay

## 2019-01-13 ENCOUNTER — Encounter: Payer: Self-pay | Admitting: Internal Medicine

## 2019-01-13 ENCOUNTER — Ambulatory Visit (INDEPENDENT_AMBULATORY_CARE_PROVIDER_SITE_OTHER): Payer: Medicare Other | Admitting: Internal Medicine

## 2019-01-13 VITALS — BP 132/70 | HR 87 | Temp 97.2°F | Ht 61.5 in | Wt 90.0 lb

## 2019-01-13 DIAGNOSIS — R918 Other nonspecific abnormal finding of lung field: Secondary | ICD-10-CM | POA: Diagnosis not present

## 2019-01-13 DIAGNOSIS — F17210 Nicotine dependence, cigarettes, uncomplicated: Secondary | ICD-10-CM

## 2019-01-13 DIAGNOSIS — M50322 Other cervical disc degeneration at C5-C6 level: Secondary | ICD-10-CM | POA: Diagnosis not present

## 2019-01-13 DIAGNOSIS — M47812 Spondylosis without myelopathy or radiculopathy, cervical region: Secondary | ICD-10-CM | POA: Diagnosis not present

## 2019-01-13 DIAGNOSIS — M0579 Rheumatoid arthritis with rheumatoid factor of multiple sites without organ or systems involvement: Secondary | ICD-10-CM | POA: Diagnosis not present

## 2019-01-13 MED ORDER — NICOTINE 7 MG/24HR TD PT24: 7.0000 mg | MEDICATED_PATCH | TRANSDERMAL | 1 refills | Status: DC

## 2019-01-13 NOTE — Patient Instructions (Addendum)
-   It was nice to meet you - Nicotine patch - Neck x-ray - Bronchoscopy is scheduled for 01/18/2019 at 11:15 at [facility].   Arrive by 10:00 am.  -Your COVID test is schedule for 10:45am at [facility] - Will send message to Dr. Julien Nordmann regarding the tests above.

## 2019-01-13 NOTE — Progress Notes (Signed)
The purposed treatment plan is for discussion purpose only and is not a binding recommendation.  The patient was not physically examined nor present for their treatment options.  Therefore, final treatment plans cannot be decided.  

## 2019-01-14 ENCOUNTER — Other Ambulatory Visit (HOSPITAL_COMMUNITY)
Admission: RE | Admit: 2019-01-14 | Discharge: 2019-01-14 | Disposition: A | Discharge status: Home / Self Care | Payer: Medicare Other | Source: Ambulatory Visit | Attending: Internal Medicine | Admitting: Internal Medicine

## 2019-01-14 DIAGNOSIS — Z20828 Contact with and (suspected) exposure to other viral communicable diseases: Secondary | ICD-10-CM | POA: Diagnosis not present

## 2019-01-14 DIAGNOSIS — Z01812 Encounter for preprocedural laboratory examination: Secondary | ICD-10-CM | POA: Diagnosis not present

## 2019-01-15 LAB — NOVEL CORONAVIRUS, NAA (HOSP ORDER, SEND-OUT TO REF LAB; TAT 18-24 HRS): SARS-CoV-2, NAA: NOT DETECTED

## 2019-01-17 ENCOUNTER — Other Ambulatory Visit: Payer: Self-pay

## 2019-01-17 ENCOUNTER — Encounter (HOSPITAL_COMMUNITY): Payer: Self-pay | Admitting: *Deleted

## 2019-01-17 NOTE — Progress Notes (Signed)
Spoke with pt for pre-op call. Pt denies cardiac history, HTN or Diabetes.  Pt had her Covid test on 01/14/19 and it is negative. She states she has been in quarantine since test was done.  Informed pt of Visitation policy and she voiced understanding.

## 2019-01-18 ENCOUNTER — Ambulatory Visit (HOSPITAL_COMMUNITY): Payer: Medicare Other | Admitting: Anesthesiology

## 2019-01-18 ENCOUNTER — Encounter (HOSPITAL_COMMUNITY): Payer: Self-pay

## 2019-01-18 ENCOUNTER — Ambulatory Visit (HOSPITAL_COMMUNITY)
Admission: RE | Admit: 2019-01-18 | Discharge: 2019-01-18 | Disposition: A | Discharge status: Home / Self Care | Payer: Medicare Other | Attending: Internal Medicine | Admitting: Internal Medicine

## 2019-01-18 ENCOUNTER — Ambulatory Visit (HOSPITAL_COMMUNITY): Payer: Medicare Other

## 2019-01-18 ENCOUNTER — Other Ambulatory Visit: Payer: Self-pay

## 2019-01-18 ENCOUNTER — Encounter (HOSPITAL_COMMUNITY)
Admission: RE | Disposition: A | Discharge status: Home / Self Care | Payer: Self-pay | Source: Home / Self Care | Attending: Internal Medicine

## 2019-01-18 DIAGNOSIS — J984 Other disorders of lung: Secondary | ICD-10-CM | POA: Diagnosis not present

## 2019-01-18 DIAGNOSIS — M810 Age-related osteoporosis without current pathological fracture: Secondary | ICD-10-CM | POA: Diagnosis not present

## 2019-01-18 DIAGNOSIS — Z419 Encounter for procedure for purposes other than remedying health state, unspecified: Secondary | ICD-10-CM

## 2019-01-18 DIAGNOSIS — R911 Solitary pulmonary nodule: Secondary | ICD-10-CM | POA: Diagnosis not present

## 2019-01-18 DIAGNOSIS — C50919 Malignant neoplasm of unspecified site of unspecified female breast: Secondary | ICD-10-CM | POA: Diagnosis not present

## 2019-01-18 DIAGNOSIS — E785 Hyperlipidemia, unspecified: Secondary | ICD-10-CM | POA: Insufficient documentation

## 2019-01-18 DIAGNOSIS — R9389 Abnormal findings on diagnostic imaging of other specified body structures: Secondary | ICD-10-CM

## 2019-01-18 DIAGNOSIS — Z7983 Long term (current) use of bisphosphonates: Secondary | ICD-10-CM | POA: Diagnosis not present

## 2019-01-18 DIAGNOSIS — R590 Localized enlarged lymph nodes: Secondary | ICD-10-CM | POA: Diagnosis not present

## 2019-01-18 DIAGNOSIS — Z01818 Encounter for other preprocedural examination: Secondary | ICD-10-CM | POA: Diagnosis not present

## 2019-01-18 DIAGNOSIS — Z9889 Other specified postprocedural states: Secondary | ICD-10-CM

## 2019-01-18 DIAGNOSIS — R918 Other nonspecific abnormal finding of lung field: Secondary | ICD-10-CM | POA: Insufficient documentation

## 2019-01-18 DIAGNOSIS — Z7951 Long term (current) use of inhaled steroids: Secondary | ICD-10-CM | POA: Diagnosis not present

## 2019-01-18 DIAGNOSIS — M069 Rheumatoid arthritis, unspecified: Secondary | ICD-10-CM | POA: Insufficient documentation

## 2019-01-18 DIAGNOSIS — Z853 Personal history of malignant neoplasm of breast: Secondary | ICD-10-CM | POA: Insufficient documentation

## 2019-01-18 DIAGNOSIS — F17210 Nicotine dependence, cigarettes, uncomplicated: Secondary | ICD-10-CM | POA: Diagnosis not present

## 2019-01-18 DIAGNOSIS — J449 Chronic obstructive pulmonary disease, unspecified: Secondary | ICD-10-CM | POA: Insufficient documentation

## 2019-01-18 DIAGNOSIS — R599 Enlarged lymph nodes, unspecified: Secondary | ICD-10-CM | POA: Diagnosis not present

## 2019-01-18 HISTORY — PX: VIDEO BRONCHOSCOPY WITH ENDOBRONCHIAL ULTRASOUND: SHX6177

## 2019-01-18 HISTORY — DX: Dyspnea, unspecified: R06.00

## 2019-01-18 LAB — PROTIME-INR
INR: 1 (ref 0.8–1.2)
Prothrombin Time: 13.2 seconds (ref 11.4–15.2)

## 2019-01-18 LAB — APTT: aPTT: 41 seconds — ABNORMAL HIGH (ref 24–36)

## 2019-01-18 SURGERY — BRONCHOSCOPY, WITH EBUS
Anesthesia: General

## 2019-01-18 MED ORDER — ONDANSETRON HCL 4 MG/2ML IJ SOLN
INTRAMUSCULAR | Status: AC
  Filled 2019-01-18: qty 2

## 2019-01-18 MED ORDER — SUCCINYLCHOLINE CHLORIDE 200 MG/10ML IV SOSY
PREFILLED_SYRINGE | INTRAVENOUS | Status: DC | PRN
  Administered 2019-01-18: 100 mg via INTRAVENOUS

## 2019-01-18 MED ORDER — DEXAMETHASONE SODIUM PHOSPHATE 10 MG/ML IJ SOLN
INTRAMUSCULAR | Status: AC
  Filled 2019-01-18: qty 1

## 2019-01-18 MED ORDER — PROPOFOL 10 MG/ML IV BOLUS
INTRAVENOUS | Status: AC
  Filled 2019-01-18: qty 20

## 2019-01-18 MED ORDER — ACETAMINOPHEN 500 MG PO TABS
ORAL_TABLET | ORAL | Status: AC
  Administered 2019-01-18: 1000 mg via ORAL
  Filled 2019-01-18: qty 2

## 2019-01-18 MED ORDER — ACETAMINOPHEN 500 MG PO TABS
1000.0000 mg | ORAL_TABLET | Freq: Once | ORAL | Status: AC
  Administered 2019-01-18: 12:00:00 1000 mg via ORAL

## 2019-01-18 MED ORDER — ROCURONIUM BROMIDE 10 MG/ML (PF) SYRINGE
PREFILLED_SYRINGE | INTRAVENOUS | Status: DC | PRN
  Administered 2019-01-18: 45 mg via INTRAVENOUS
  Administered 2019-01-18: 5 mg via INTRAVENOUS

## 2019-01-18 MED ORDER — FENTANYL CITRATE (PF) 100 MCG/2ML IJ SOLN: 25.0000 ug | INTRAMUSCULAR | Status: DC | PRN

## 2019-01-18 MED ORDER — LACTATED RINGERS IV SOLN
INTRAVENOUS | Status: DC | PRN
  Administered 2019-01-18: 13:00:00 via INTRAVENOUS

## 2019-01-18 MED ORDER — ONDANSETRON HCL 4 MG/2ML IJ SOLN
INTRAMUSCULAR | Status: DC | PRN
  Administered 2019-01-18: 4 mg via INTRAVENOUS

## 2019-01-18 MED ORDER — EPHEDRINE SULFATE-NACL 50-0.9 MG/10ML-% IV SOSY
PREFILLED_SYRINGE | INTRAVENOUS | Status: DC | PRN
  Administered 2019-01-18 (×2): 10 mg via INTRAVENOUS

## 2019-01-18 MED ORDER — FENTANYL CITRATE (PF) 250 MCG/5ML IJ SOLN
INTRAMUSCULAR | Status: DC | PRN
  Administered 2019-01-18: 50 ug via INTRAVENOUS

## 2019-01-18 MED ORDER — EPHEDRINE 5 MG/ML INJ
INTRAVENOUS | Status: AC
  Filled 2019-01-18: qty 10

## 2019-01-18 MED ORDER — DEXAMETHASONE SODIUM PHOSPHATE 10 MG/ML IJ SOLN
INTRAMUSCULAR | Status: DC | PRN
  Administered 2019-01-18: 5 mg via INTRAVENOUS

## 2019-01-18 MED ORDER — PROPOFOL 10 MG/ML IV BOLUS
INTRAVENOUS | Status: DC | PRN
  Administered 2019-01-18: 70 mg via INTRAVENOUS

## 2019-01-18 MED ORDER — LIDOCAINE 2% (20 MG/ML) 5 ML SYRINGE
INTRAMUSCULAR | Status: DC | PRN
  Administered 2019-01-18: 20 mg via INTRAVENOUS

## 2019-01-18 MED ORDER — FENTANYL CITRATE (PF) 250 MCG/5ML IJ SOLN
INTRAMUSCULAR | Status: AC
  Filled 2019-01-18: qty 5

## 2019-01-18 MED ORDER — SUGAMMADEX SODIUM 200 MG/2ML IV SOLN
INTRAVENOUS | Status: DC | PRN
  Administered 2019-01-18: 80.8 mg via INTRAVENOUS

## 2019-01-18 MED ORDER — LACTATED RINGERS IV SOLN
INTRAVENOUS | Status: DC
  Administered 2019-01-18: 12:00:00 via INTRAVENOUS

## 2019-01-18 SURGICAL SUPPLY — 31 items
ADAPTER VALVE BIOPSY EBUS (MISCELLANEOUS) IMPLANT
ADPTR VALVE BIOPSY EBUS (MISCELLANEOUS)
BRUSH CYTOL CELLEBRITY 1.5X140 (MISCELLANEOUS) IMPLANT
CANISTER SUCT 3000ML PPV (MISCELLANEOUS) ×2 IMPLANT
CONT SPEC 4OZ CLIKSEAL STRL BL (MISCELLANEOUS) ×4 IMPLANT
COVER BACK TABLE 60X90IN (DRAPES) ×2 IMPLANT
FORCEPS BIOP RJ4 1.8 (CUTTING FORCEPS) IMPLANT
GAUZE SPONGE 4X4 12PLY STRL (GAUZE/BANDAGES/DRESSINGS) ×2 IMPLANT
GLOVE BIO SURGEON STRL SZ7.5 (GLOVE) ×2 IMPLANT
GOWN STRL REUS W/ TWL LRG LVL3 (GOWN DISPOSABLE) ×1 IMPLANT
GOWN STRL REUS W/TWL LRG LVL3 (GOWN DISPOSABLE) ×1
KIT CLEAN ENDO COMPLIANCE (KITS) ×4 IMPLANT
KIT TURNOVER KIT B (KITS) ×2 IMPLANT
MARKER SKIN DUAL TIP RULER LAB (MISCELLANEOUS) ×2 IMPLANT
NEEDLE ASPIRATION VIZISHOT 19G (NEEDLE) IMPLANT
NEEDLE ASPIRATION VIZISHOT 21G (NEEDLE) ×2 IMPLANT
NS IRRIG 1000ML POUR BTL (IV SOLUTION) ×2 IMPLANT
OIL SILICONE PENTAX (PARTS (SERVICE/REPAIRS)) ×2 IMPLANT
PAD ARMBOARD 7.5X6 YLW CONV (MISCELLANEOUS) ×4 IMPLANT
SYR 20ML ECCENTRIC (SYRINGE) ×4 IMPLANT
SYR 20ML LL LF (SYRINGE) ×4 IMPLANT
SYR 50ML SLIP (SYRINGE) ×2 IMPLANT
SYR 5ML LUER SLIP (SYRINGE) ×2 IMPLANT
TOWEL GREEN STERILE FF (TOWEL DISPOSABLE) ×2 IMPLANT
TRAP SPECIMEN MUCOUS 40CC (MISCELLANEOUS) ×2 IMPLANT
TUBE CONNECTING 20X1/4 (TUBING) ×2 IMPLANT
UNDERPAD 30X30 (UNDERPADS AND DIAPERS) ×2 IMPLANT
VALVE BIOPSY  SINGLE USE (MISCELLANEOUS) ×1
VALVE BIOPSY SINGLE USE (MISCELLANEOUS) ×1 IMPLANT
VALVE SUCTION BRONCHIO DISP (MISCELLANEOUS) ×2 IMPLANT
WATER STERILE IRR 1000ML POUR (IV SOLUTION) ×2 IMPLANT

## 2019-01-18 NOTE — Anesthesia Postprocedure Evaluation (Signed)
Anesthesia Post Note  Patient: Olivia Hooper  Procedure(s) Performed: VIDEO BRONCHOSCOPY WITH ENDOBRONCHIAL ULTRASOUND (N/A )     Patient location during evaluation: PACU Anesthesia Type: General Level of consciousness: awake and alert Pain management: pain level controlled Vital Signs Assessment: post-procedure vital signs reviewed and stable Respiratory status: spontaneous breathing, nonlabored ventilation, respiratory function stable and patient connected to nasal cannula oxygen Cardiovascular status: blood pressure returned to baseline and stable Postop Assessment: no apparent nausea or vomiting Anesthetic complications: no    Last Vitals:  Vitals:   01/18/19 1445 01/18/19 1500  BP: (!) 109/59 (!) 119/52  Pulse: 84 80  Resp: 15 16  Temp:  36.8 C  SpO2: 93% 93%    Last Pain:  Vitals:   01/18/19 1500  TempSrc:   PainSc: 0-No pain                 Jaylynn Mcaleer L Kino Dunsworth

## 2019-01-18 NOTE — Discharge Instructions (Signed)
Flexible Bronchoscopy, Care After This sheet gives you information about how to care for yourself after your test. Your doctor may also give you more specific instructions. If you have problems or questions, contact your doctor. Follow these instructions at home: Eating and drinking  The day after the test, go back to your normal diet. Driving  Do not drive for 24 hours if you were given a medicine to help you relax (sedative).  Do not drive or use heavy machinery while taking prescription pain medicine. General instructions   Take over-the-counter and prescription medicines only as told by your doctor.  Return to your normal activities as told. Ask what activities are safe for you.  Do not use any products that have nicotine or tobacco in them. This includes cigarettes and e-cigarettes. If you need help quitting, ask your doctor.  Keep all follow-up visits as told by your doctor. This is important. It is very important if you had a tissue sample (biopsy) taken. Get help right away if:  You have shortness of breath that gets worse.  You get light-headed.  You feel like you are going to pass out (faint).  You have chest pain.  You cough up: ? More than a little blood. ? More blood than before. Summary  Do not eat or drink anything (not even water) for 2 hours after your test, or until your numbing medicine wears off.  Do not use cigarettes. Do not use e-cigarettes.  Get help right away if you have chest pain. This information is not intended to replace advice given to you by your health care provider. Make sure you discuss any questions you have with your health care provider. Document Released: 12/21/2008 Document Revised: 02/05/2017 Document Reviewed: 03/13/2016 Elsevier Patient Education  2020 [employer].

## 2019-01-18 NOTE — Transfer of Care (Signed)
Immediate Anesthesia Transfer of Care Note  Patient: Olivia Hooper  Procedure(s) Performed: VIDEO BRONCHOSCOPY WITH ENDOBRONCHIAL ULTRASOUND (N/A )  Patient Location: PACU  Anesthesia Type:General  Level of Consciousness: drowsy and patient cooperative  Airway & Oxygen Therapy: Patient Spontanous Breathing and Patient connected to face mask oxygen  Post-op Assessment: Report given to RN and Post -op Vital signs reviewed and stable  Post vital signs: Reviewed and stable  Last Vitals:  Vitals Value Taken Time  BP 117/56 01/18/19 1434  Temp    Pulse 80 01/18/19 1436  Resp 17 01/18/19 1436  SpO2 100 % 01/18/19 1436  Vitals shown include unvalidated device data.  Last Pain:  Vitals:   01/18/19 1123  TempSrc:   PainSc: 0-No pain         Complications: No apparent anesthesia complications

## 2019-01-18 NOTE — Interval H&P Note (Signed)
Discussed findings on C spine films with anesthesia, they will use glidescope to reduce cervical motion during intubation.

## 2019-01-18 NOTE — Anesthesia Preprocedure Evaluation (Addendum)
Anesthesia Evaluation  Patient identified by MRN, date of birth, ID band Patient awake    Reviewed: Allergy & Precautions, NPO status , Patient's Chart, lab work & pertinent test results  Airway Mallampati: I  TM Distance: >3 FB Neck ROM: Full    Dental  (+) Edentulous Upper, Edentulous Lower   Pulmonary shortness of breath, COPD,  COPD inhaler, Current Smoker and Patient abstained from smoking.,  PFT 2016 Interpretation: The FEV1, FEV1/FVC ratio and FEF25-75% are reduced indicating airway obstruction. Lung volumes are within normal limits. Following administration of bronchodilators, there is no significant response. The reduced diffusing capacity indicates a moderate loss of functional alveolar capillary surface. However, the diffusing capacity was not corrected for the patient's hemoglobin. Conclusions: Although there is airway obstruction and a diffusion defect suggesting emphysema, the absence of overinflation is inconsistent with that diagnosis.   Pulmonary exam normal breath sounds clear to auscultation       Cardiovascular negative cardio ROS Normal cardiovascular exam Rhythm:Regular Rate:Normal     Neuro/Psych negative neurological ROS  negative psych ROS   GI/Hepatic negative GI ROS, Neg liver ROS,   Endo/Other  negative endocrine ROS  Renal/GU negative Renal ROS  negative genitourinary   Musculoskeletal  (+) Arthritis , Rheumatoid disorders,    Abdominal   Peds  Hematology  (+) Blood dyscrasia (Hgb 10.0), anemia ,   Anesthesia Other Findings   Reproductive/Obstetrics                            Anesthesia Physical Anesthesia Plan  ASA: III  Anesthesia Plan: General   Post-op Pain Management:    Induction: Intravenous  PONV Risk Score and Plan: Midazolam, Dexamethasone and Ondansetron  Airway Management Planned: Oral ETT  Additional Equipment:   Intra-op Plan:    Post-operative Plan: Extubation in OR  Informed Consent: I have reviewed the patients History and Physical, chart, labs and discussed the procedure including the risks, benefits and alternatives for the proposed anesthesia with the patient or authorized representative who has indicated his/her understanding and acceptance.     Dental advisory given  Plan Discussed with: CRNA  Anesthesia Plan Comments:         Anesthesia Quick Evaluation

## 2019-01-18 NOTE — Procedures (Signed)
Video Bronchoscopy with Endobronchial Ultrasound Procedure Note  Date of Operation: 01/18/2019  Pre-op Diagnosis: Lung mass, adenopathy  Post-op Diagnosis: Lung mass, adenopathy  Surgeon: Dr. Erskine Emery, Dr. Lenice Llamas   Anesthesia: General endotracheal anesthesia  Operation:  Flexible video fiberoptic bronchoscopy with transbronchial biopsies under fluorscopic guidance, endobronchial ultrasound-guided biopsy, and BAL.  Estimated Blood Loss: less than 50   Complications: None immediate  Indications and History: Olivia Hooper is a 77 y.o. female with lung mass, adenopathy.  The risks, benefits, complications, treatment options and expected outcomes were discussed with the patient.  The possibilities of pneumothorax, pneumonia, reaction to medication, pulmonary aspiration, perforation of a viscus, bleeding, failure to diagnose a condition and creating a complication requiring transfusion or operation were discussed with the patient who freely signed the consent.    Description of Procedure: The patient was examined in the preoperative area and history and data from the preprocedure consultation were reviewed. It was deemed appropriate to proceed.  The patient was taken to OR, identified as Olivia Hooper and the procedure verified as Flexible Video Fiberoptic Bronchoscopy.  A Time Out was held and the above information confirmed. After being taken to the operating room general anesthesia was initiated and the patient  was orally intubated. The video fiberoptic bronchoscope was introduced via the endotracheal tube and a general inspection was performed which showed scattered bronchial pitting and thick secretions c/w chronic bronchitis.     The standard scope was then withdrawn and the endobronchial ultrasound was used to identify and characterize the peritracheal, hilar and bronchial lymph nodes. Inspection showed no hilar or paratracheal adenopathy but did show a R hilar node. Using  real-time ultrasound guidance Wang needle biopsies were take from Station 11R nodes and were sent for cytology.   Regular scope then re-advanced and under fluoroscopic guidance transbronchial biopsies of RUL performed x 7 followed by BAL.  Some oozing blood post procedure controlled with iced saline.  1 min 5 sec fluoro time.  The patient tolerated the procedure well without apparent complications. The bronchoscope was withdrawn. Anesthesia was reversed and the patient was taken to the PACU for recovery.   Samples: 1. Wang needle biopsies from 11R node 2. BAL RUL 3. Transbronchial biopsies RUL  Plans:  The patient will be discharged from the PACU to home when recovered from anesthesia and after chest x-ray. We will review the cytology, pathology and microbiology results with the patient when they become available. Outpatient followup will be with Dr. Tamala Julian.   Erskine Emery MD

## 2019-01-18 NOTE — Interval H&P Note (Signed)
Doing well, no interim issues Okay with plan Risks/benefits discussed at length Will talk to daughter after procedure  History and Physical Interval Note:  01/18/2019 12:16 PM  Olivia Hooper  has presented today for surgery, with the diagnosis of LUNG MASS.  The various methods of treatment have been discussed with the patient and family. After consideration of risks, benefits and other options for treatment, the patient has consented to  Procedure(s): [place] (N/A) as a surgical intervention.  The patient's history has been reviewed, patient examined, no change in status, stable for surgery.  I have reviewed the patient's chart and labs.  Questions were answered to the patient's satisfaction.     Candee Furbish

## 2019-01-18 NOTE — Anesthesia Procedure Notes (Signed)
Procedure Name: Intubation Date/Time: 01/18/2019 1:10 PM Performed by: Kathryne Hitch, CRNA Pre-anesthesia Checklist: Patient identified, Emergency Drugs available, Suction available, Patient being monitored and Timeout performed Patient Re-evaluated:Patient Re-evaluated prior to induction Oxygen Delivery Method: Circle system utilized Preoxygenation: Pre-oxygenation with 100% oxygen Induction Type: IV induction and Rapid sequence Laryngoscope Size: Mac and 3 Grade View: Grade I Tube type: Oral Tube size: 8.5 mm Number of attempts: 1 Airway Equipment and Method: Stylet Placement Confirmation: ETT inserted through vocal cords under direct vision,  positive ETCO2 and breath sounds checked- equal and bilateral Secured at: 22 cm Tube secured with: Tape Dental Injury: Teeth and Oropharynx as per pre-operative assessment

## 2019-01-19 ENCOUNTER — Encounter (HOSPITAL_COMMUNITY): Payer: Self-pay | Admitting: Internal Medicine

## 2019-01-19 LAB — CYTOLOGY - NON PAP

## 2019-01-19 LAB — SURGICAL PATHOLOGY

## 2019-01-19 LAB — ACID FAST SMEAR (AFB, MYCOBACTERIA): Acid Fast Smear: NEGATIVE

## 2019-01-21 LAB — CULTURE, RESPIRATORY W GRAM STAIN: Culture: NO GROWTH

## 2019-01-23 ENCOUNTER — Encounter (HOSPITAL_COMMUNITY)
Admission: RE | Admit: 2019-01-23 | Discharge: 2019-01-23 | Disposition: A | Discharge status: Home / Self Care | Payer: Medicare Other | Source: Ambulatory Visit | Attending: Internal Medicine | Admitting: Internal Medicine

## 2019-01-23 ENCOUNTER — Other Ambulatory Visit: Payer: Self-pay

## 2019-01-23 ENCOUNTER — Telehealth: Payer: Self-pay | Admitting: Internal Medicine

## 2019-01-23 ENCOUNTER — Encounter: Payer: Self-pay | Admitting: *Deleted

## 2019-01-23 DIAGNOSIS — J439 Emphysema, unspecified: Secondary | ICD-10-CM | POA: Diagnosis not present

## 2019-01-23 DIAGNOSIS — R911 Solitary pulmonary nodule: Secondary | ICD-10-CM | POA: Insufficient documentation

## 2019-01-23 DIAGNOSIS — Z853 Personal history of malignant neoplasm of breast: Secondary | ICD-10-CM | POA: Diagnosis not present

## 2019-01-23 DIAGNOSIS — R599 Enlarged lymph nodes, unspecified: Secondary | ICD-10-CM | POA: Insufficient documentation

## 2019-01-23 DIAGNOSIS — F17200 Nicotine dependence, unspecified, uncomplicated: Secondary | ICD-10-CM | POA: Insufficient documentation

## 2019-01-23 DIAGNOSIS — I700 Atherosclerosis of aorta: Secondary | ICD-10-CM | POA: Diagnosis not present

## 2019-01-23 DIAGNOSIS — J449 Chronic obstructive pulmonary disease, unspecified: Secondary | ICD-10-CM | POA: Diagnosis not present

## 2019-01-23 DIAGNOSIS — K5730 Diverticulosis of large intestine without perforation or abscess without bleeding: Secondary | ICD-10-CM | POA: Insufficient documentation

## 2019-01-23 DIAGNOSIS — R918 Other nonspecific abnormal finding of lung field: Secondary | ICD-10-CM | POA: Diagnosis not present

## 2019-01-23 LAB — AEROBIC/ANAEROBIC CULTURE W GRAM STAIN (SURGICAL/DEEP WOUND): Culture: NO GROWTH

## 2019-01-23 LAB — GLUCOSE, CAPILLARY: Glucose-Capillary: 88 mg/dL (ref 70–99)

## 2019-01-23 MED ORDER — FLUDEOXYGLUCOSE F - 18 (FDG) INJECTION
5.1000 | Freq: Once | INTRAVENOUS | Status: AC | PRN
  Administered 2019-01-23: 5.1 via INTRAVENOUS

## 2019-01-23 NOTE — Telephone Encounter (Signed)
Scheduled appt per 11/16 sch message - unable to reach pt  On mobile and unable to leave message.   Called home . Unable to reach pt . Left message with apt date and time .

## 2019-01-23 NOTE — Progress Notes (Signed)
Oncology Nurse Navigator Documentation  Oncology Nurse Navigator Flowsheets 01/23/2019  Navigator Location [facility]  Referral Date to RadOnc/MedOnc -  Navigator Encounter Type Other/I followed up on Olivia Hooper's scheduled.  She was to see Dr. Julien Nordmann on 11/18 but appt is cancelled.  I updated scheduling to call patient and scheduled her to be seen next week to discuss treatment plan.   Barriers/Navigation Needs Coordination of Care  Interventions Coordination of Care  Acuity Level 2-Minimal Needs (1-2 Barriers Identified)  Coordination of Care Other  Time Spent with Patient 15

## 2019-01-24 ENCOUNTER — Telehealth: Payer: Self-pay | Admitting: Internal Medicine

## 2019-01-24 DIAGNOSIS — R948 Abnormal results of function studies of other organs and systems: Secondary | ICD-10-CM

## 2019-01-24 NOTE — Telephone Encounter (Signed)
Call returned to patient, confirmed DOB, patient states Dr. Tamala Julian went over her biopsy results but she states she was still out of it and does not remember what was said. She is requesting that DS give her a call. I made her away this week DS is in his hospital rotation and I would send him the message but being that it is not an emergency I don't know when he will respond. Voiced understanding.   DS please give patient a call at your convenience. Thanks.

## 2019-01-25 ENCOUNTER — Ambulatory Visit: Payer: Medicare Other | Admitting: Internal Medicine

## 2019-01-25 NOTE — Telephone Encounter (Signed)
Called the patient back and advised per the prior message that Dr. Tamala Julian was at the hospital and a message was sent to him. Advised her that once a response has been received she would be advised. Nothing further needed at this time.

## 2019-01-25 NOTE — Telephone Encounter (Signed)
Patient is returning call for biopsy results.  Patient phone number is [phone].

## 2019-01-25 NOTE — Telephone Encounter (Signed)
Spoke with patient. She stated that she was calling back about results. Advised her again that Dr. Tamala Julian was working at the hospital and we have sent the message to him. She is aware that as soon as we hear about the results, we will call her. She verbalized understanding.   Dr. Tamala Julian, please advise when you have a chance. Thanks!

## 2019-01-25 NOTE — Telephone Encounter (Signed)
Patient is calling back [phone].

## 2019-01-26 ENCOUNTER — Telehealth: Payer: Self-pay | Admitting: Internal Medicine

## 2019-01-26 NOTE — Telephone Encounter (Signed)
Called and updated regarding PET and bronch results so far. Odd case, may need to do a mediastinoscopy if AFB cultures negative from bronch.

## 2019-01-30 ENCOUNTER — Telehealth: Payer: Self-pay | Admitting: Internal Medicine

## 2019-01-30 NOTE — Telephone Encounter (Signed)
Confirmed My Chart virtual appt for 11/24 and verified pt's information

## 2019-01-31 ENCOUNTER — Inpatient Hospital Stay (HOSPITAL_BASED_OUTPATIENT_CLINIC_OR_DEPARTMENT_OTHER): Payer: Medicare Other | Admitting: Internal Medicine

## 2019-01-31 ENCOUNTER — Encounter: Payer: Self-pay | Admitting: Internal Medicine

## 2019-01-31 ENCOUNTER — Telehealth: Payer: Self-pay | Admitting: Internal Medicine

## 2019-01-31 DIAGNOSIS — J984 Other disorders of lung: Secondary | ICD-10-CM | POA: Diagnosis not present

## 2019-01-31 NOTE — Telephone Encounter (Signed)
Called and spoke with pt. Pt said she just had a PET scan performed which was ordered by Dr. Julien Nordmann which she is wanting Dr. Tamala Julian to review prior to her upcoming appt with him on 12/4.  Pt said she was told that there are multiple hot spots that showed up on the PET.   Pt is hoping that Dr. Tamala Julian might be able to try to get a game plan together prior to her OV with him.  Stated to pt that I would send this to Dr. Tamala Julian and pt verbalized understanding. Routing to Dr. Tamala Julian.

## 2019-01-31 NOTE — Progress Notes (Signed)
[facility] Telephone:([phone]) [phone]   Fax:(336) [phone]  PROGRESS NOTE FOR TELEMEDICINE VISITS  Olivia Hooper, [address] [place] [address]  I connected with@ on 01/31/19 at 12:00 PM EST by video enabled telemedicine visit and verified that I am speaking with the correct person using two identifiers.   I discussed the limitations, risks, security and privacy concerns of performing an evaluation and management service by telemedicine and the availability of in-person appointments. I also discussed with the patient that there may be a patient responsible charge related to this service. The patient expressed understanding and agreed to proceed.  Other persons participating in the visit and their role in the encounter:  Son and daughter  Patient's location:  Home Provider's location: [place].  DIAGNOSIS: suspicious right upper lobe as well as superior segment right lower lobe mass concerning for primary bronchogenic carcinoma.  The patient also has multiple small bilateral pulmonary nodules.  PRIOR THERAPY: None  CURRENT THERAPY: None  INTERVAL HISTORY: Olivia Hooper 78 y.o. female has a MyChart video virtual visit with me today for evaluation and recommendation regarding her condition.  The patient is feeling fine today with no concerning complaints.  She denied having any current chest pain but has mild shortness of breath with exertion with no significant cough or hemoptysis.  She denied having any fever or chills.  She has no nausea, vomiting, diarrhea or constipation.  She has no headache or visual changes.  She was seen by Dr. Ina Homes and had bronchoscopy performed recently but the final pathology was not conclusive for malignancy.  She also had a PET scan performed recently and we are having the visit for evaluation and discussion of her PET scan results and further recommendation regarding her condition.  MEDICAL  HISTORY: Past Medical History:  Diagnosis Date   Breast cancer ([place])    on left   Carpal tunnel syndrome    COPD (chronic obstructive pulmonary disease) (HCC)    Dyspnea    Hypercalcemia    Hyperlipidemia    Hyperparathyroidism (H[facility])    Osteoporosis    Pneumonia    RA (rheumatoid arthritis) (H[facility])    Vertigo    Vitamin D deficiency     ALLERGIES:  is allergic to buprenorphine hcl and morphine and related.  MEDICATIONS:  Current Outpatient Medications  Medication Sig Dispense Refill   albuterol (VENTOLIN HFA) 108 (90 Base) MCG/ACT inhaler Inhale 2 puffs into the lungs every 6 (six) hours as needed for wheezing or shortness of breath.      cefdinir (OMNICEF) 300 MG capsule Take 300 mg by mouth 2 (two) times daily.     Certolizumab Pegol (CIMZIA [place]) Inject 2 Doses/Fill into the skin every 28 (twenty-eight) days.     denosumab (PROLIA) 60 MG/ML SOSY injection Inject 60 mg into the skin every 6 (six) months.     folic acid (FOLVITE) 1 MG tablet Take 3 mg by mouth See admin instructions. Take 3 mg daily for 6 days, skip dose on methotrexate day (Thursday)     methotrexate 2.5 MG tablet Take 15 mg by mouth every Thursday.      mometasone-formoterol (DULERA) 100-5 MCG/ACT AERO Inhale 2 puffs into the lungs 2 (two) times daily.     nicotine (NICODERM CQ - DOSED IN MG/24 HR) 7 mg/24hr patch Place 1 patch (7 mg total) onto the skin daily. 30 patch 1   Polyethyl Glycol-Propyl Glycol (SYSTANE OP) Place 1 drop into  both eyes daily as needed (dry eyes).     simvastatin (ZOCOR) 40 MG tablet Take 40 mg by mouth at bedtime.      traMADol (ULTRAM) 50 MG tablet Take 50 mg by mouth 2 (two) times daily as needed for moderate pain.      triamcinolone ointment (KENALOG) 0.1 % Apply 1 application topically 2 (two) times daily as needed (dry skin).      No current facility-administered medications for this visit.     SURGICAL HISTORY:  Past Surgical History:  Procedure  Laterality Date   CARPAL TUNNEL RELEASE     CATARACT EXTRACTION     MASTECTOMY Left    VESICOVAGINAL FISTULA CLOSURE W/ TAH     VIDEO BRONCHOSCOPY WITH ENDOBRONCHIAL ULTRASOUND N/A 01/18/2019   Procedure: VIDEO BRONCHOSCOPY WITH ENDOBRONCHIAL ULTRASOUND;  Surgeon: Candee Furbish, MD;  Location: [facility];  Service: Thoracic;  Laterality: N/A;    REVIEW OF SYSTEMS:  Constitutional: positive for fatigue Eyes: negative Ears, nose, mouth, throat, and face: negative Respiratory: positive for dyspnea on exertion Cardiovascular: negative Gastrointestinal: negative Genitourinary:negative Integument/breast: negative Hematologic/lymphatic: negative Musculoskeletal:negative Neurological: negative Behavioral/Psych: negative Endocrine: negative Allergic/Immunologic: negative   LABORATORY DATA: Lab Results  Component Value Date   WBC 7.9 01/11/2019   HGB 10.0 (L) 01/11/2019   HCT 32.9 (L) 01/11/2019   MCV 88.7 01/11/2019   PLT 317 01/11/2019      Chemistry      Component Value Date/Time   NA 138 01/11/2019 1314   K 4.1 01/11/2019 1314   CL 101 01/11/2019 1314   CO2 27 01/11/2019 1314   BUN 9 01/11/2019 1314   CREATININE 0.72 01/11/2019 1314      Component Value Date/Time   CALCIUM 10.2 01/11/2019 1314   ALKPHOS 59 01/11/2019 1314   AST 15 01/11/2019 1314   ALT <6 01/11/2019 1314   BILITOT 0.3 01/11/2019 1314       RADIOGRAPHIC STUDIES: Dg Chest 1 View  Result Date: 01/18/2019 CLINICAL DATA:  Status post right lung biopsy. EXAM: CHEST  1 VIEW COMPARISON:  Same day. FINDINGS: The heart size and mediastinal contours are within normal limits. No pneumothorax pleural effusion is noted. Left lung is clear. Continued presence of Cavitary lesion seen in right upper lobe with surrounding inflammation. The visualized skeletal structures are unremarkable. IMPRESSION: No pneumothorax is noted. Continued presence of cavitary lesion seen in right upper lobe. Aortic Atherosclerosis  (ICD10-I70.0). Electronically Signed   By: Marijo Conception M.D.   On: 01/18/2019 14:57   Dg Chest 2 View  Result Date: 01/18/2019 CLINICAL DATA:  Preoperative assessment for RIGHT lung biopsy, abnormal chest radiograph, breast cancer, rheumatoid arthritis, smoker EXAM: CHEST - 2 VIEW COMPARISON:  01/05/2019 FINDINGS: Normal heart size, mediastinal contours, and pulmonary vascularity. Atherosclerotic calcification aorta. Emphysematous changes consistent with COPD. Volume loss in the RIGHT upper lobe with superior retraction of the RIGHT hilum. Question enlargement of RIGHT hilum as well, cannot exclude adenopathy. Cavitary lesion at the RIGHT upper lobe again identified, 4.7 x 4.2 x 7.1 cm. Peripheral infiltrate within RIGHT upper lobe. No pleural effusion or pneumothorax. Bones demineralized with biconvex thoracolumbar scoliosis. IMPRESSION: COPD changes with persistent cavitary lesion of the RIGHT upper lobe associated with peripheral infiltrate. Remaining lungs clear. Electronically Signed   By: Lavonia Dana M.D.   On: 01/18/2019 12:27   Dg Cervical Spine With Flex & Extend  Result Date: 01/13/2019 CLINICAL DATA:  Advanced rheumatoid arthritis. Needs general anesthesia. Evaluate for atlantoaxial instability. No  reported injury. EXAM: CERVICAL SPINE COMPLETE WITH FLEXION AND EXTENSION VIEWS COMPARISON:  None. FINDINGS: On the lateral view the cervical spine is visualized to the level of C7-T1. There is a normal cervical lordosis. Pre-vertebral soft tissues are within normal limits. No fracture is detected in the cervical spine. Erosive changes are present in the superior dens, which is well positioned between the lateral masses of C1. Mild-to-moderate multilevel cervical degenerative disc disease, most prominent at C5-6. There is 2 mm anterolisthesis at C3-4, unchanged on flexion and extension. There is 2 mm anterolisthesis at C4-5, measuring 2 mm on flexion and 0 mm on extension. There is evidence of mild  abnormal motion between the eroded dens and anterior C1 arch with flexion and extension; for example the distance between the inferior C1 arch and superior Harris ring measures 7 mm at rest and 4 mm with flexion. A vertical line drawn parallel to the posterior margin of the dens intersects the posterosuperior margin of the anterior C1 arch at rest, is 3 mm posterior to the posterosuperior margin of the anterior C1 arch with flexion, and is 2 mm anterior to the posterosuperior margin of the anterior C1 arch with extension. No evidence of basilar invagination. Mild bilateral facet arthropathy. Mild degenerative foraminal stenosis on the right at C3-4 and C6-7. No significant degenerative foraminal stenosis on the left. No aggressive-appearing focal osseous lesions. IMPRESSION: 1. Erosive changes in the superior dens. Mild dynamic instability at the anterior atlantoaxial articulation as detailed. No evidence of basilar invagination. 2. Mild-to-moderate multilevel cervical degenerative disc disease, most prominent at C5-6. 3. Mild bilateral facet arthropathy. 4. Mild multilevel degenerative foraminal stenosis on the right as detailed. 5. Minimal multilevel cervical spondylolisthesis, with minimal dynamic instability at C4-5 as detailed. Electronically Signed   By: Ilona Sorrel M.D.   On: 01/13/2019 11:27   Ct Chest W Contrast  Result Date: 01/06/2019 CLINICAL DATA:  Hemoptysis for several months. EXAM: CT CHEST WITH CONTRAST TECHNIQUE: Multidetector CT imaging of the chest was performed during intravenous contrast administration. CONTRAST:  55mL OMNIPAQUE IOHEXOL 300 MG/ML  SOLN COMPARISON:  Chest radiographs dated 04/26/2014. Chest CT dated 06/16/2010. FINDINGS: Cardiovascular: Atheromatous calcifications, including the coronary arteries and aorta. Normal sized heart. Mediastinum/Nodes: No enlarged mediastinal, hilar, or axillary lymph nodes. Thyroid gland, trachea, and esophagus demonstrate no significant  findings. Lungs/Pleura: Large cavitary mass in the right upper lobe and superior segment right lower lobe, extending across the major fissure. This measures 6.5 x 5.2 cm on image number 45 series 3 and 7.4 cm in length on coronal image number 92 series 5. This has low density fluid or necrotic components posteriorly and superiorly. The lungs are hyperexpanded with diffuse peribronchial thickening. There are multiple small nodules bilaterally. The largest is a cavitary nodule in the anterior aspect of the right middle lobe, measuring 1.3 x 1.1 cm on image number 81 series 3. The next largest is a 0.9 x 0.6 cm nodule in the left upper lobe on image number 72 series 3. No pleural fluid. Mild diffuse bullous changes with a centrilobular pattern. Upper Abdomen: Diffuse low density of the liver relative to the spleen. The liver is mildly heterogeneous with no discrete mass is visualized. Mildly prominent left renal pelvis and upper pole collecting system. The remainder of the right knee kidney is not included and only the superior aspect of the right kidney is included. Enlarged inferior vena cava and portal veins. Musculoskeletal: Postmastectomy changes on the left with a retropectoral implant. No evidence  of bony metastatic disease. Midthoracic vertebral compression deformities without acute fracture lines or significant bony retropulsion. Thoracic spine degenerative changes. IMPRESSION: 1. 6.5 x 5.2 x 7.4 cm cavitary mass in the right upper lobe and superior segment right lower lobe, suspicious for a primary lung carcinoma. Fungal cavitary disease is less likely. 2. Multiple small bilateral lung nodules, as described above, suspicious for metastases. 3. Changes of COPD and chronic bronchitis. 4. Mild diffuse hepatic steatosis. 5. Mildly prominent left renal pelvis and upper pole collecting system, possibly indicating mild hydronephrosis. 6. Calcific coronary artery and aortic atherosclerosis. Aortic Atherosclerosis  (ICD10-I70.0) and Emphysema (ICD10-J43.9). Electronically Signed   By: Claudie Revering M.D.   On: 01/06/2019 11:27   Nm Pet Image Initial (pi) Skull Base To Thigh  Result Date: 01/23/2019 CLINICAL DATA:  Initial treatment strategy for right upper lung mass discovered in the setting of hemoptysis. Bronchoscopic biopsy 01/18/2019 revealed no evidence of malignancy. Current smoker, with COPD. History of breast cancer. EXAM: NUCLEAR MEDICINE PET SKULL BASE TO THIGH TECHNIQUE: 5.1 mCi F-18 FDG was injected intravenously. Full-ring PET imaging was performed from the skull base to thigh after the radiotracer. CT data was obtained and used for attenuation correction and anatomic localization. Fasting blood glucose: 88 mg/dl COMPARISON:  01/06/2019 chest CT.  08/29/2015 CT abdomen/pelvis. FINDINGS: Mediastinal blood pool activity: SUV max 2.1 Liver activity: SUV max NA NECK: No hypermetabolic lymph nodes in the neck. Curvilinear muscular hypermetabolism in the left sternocleidomastoid muscle and upper right neck prevertebral musculature is nonspecific and probably activity related. Incidental CT findings: none CHEST: Intense hypermetabolism (max SUV 17.8) associated with the large irregular thick-walled cavitary upper right lung 7.1 x 5.8 cm mass, which crosses the major fissure with involvement of the posterior right upper lobe and superior segment right lower lobe. There is hypermetabolism associated with extensive patchy tree-in-bud opacity throughout both lungs, most prominent in the left upper lobe and right lower lobe. Representative max SUV 5.1 associated with anterior left upper lobe patchy tree-in-bud opacity (series 8/image 32). Low level uptake associated with anterior basilar right upper lobe 1.5 cm cavitary nodule with max SUV 1.9 (series 8/image 37). Mildly hypermetabolic mildly enlarged 1.2 cm mediastinal node anterior to the carina with max SUV 3.5 (series 4/image 64). Mildly hypermetabolic right infrahilar  node with max SUV 3.8. Hypermetabolic 3.9 x 1.6 cm anterior mediastinal soft tissue mass with max SUV 8.5 (series 4/image 58), new since 02/04/2011 chest CT. Incidental CT findings: Moderate centrilobular emphysema. Atherosclerotic nonaneurysmal thoracic aorta. Left breast prosthesis. ABDOMEN/PELVIS: No abnormal hypermetabolic activity within the liver, pancreas, adrenal glands, or spleen. No hypermetabolic lymph nodes in the abdomen or pelvis. Incidental CT findings: Granulomatous calcifications throughout the liver and spleen. Atherosclerotic nonaneurysmal abdominal aorta. Mild sigmoid diverticulosis. SKELETON: No focal hypermetabolic activity to suggest skeletal metastasis. Incidental CT findings: none IMPRESSION: 1. Hypermetabolic 3.9 x 1.6 cm anterior mediastinal soft tissue mass, new since 2012 chest CT, most worrisome for thymic malignancy. Differential includes lymphoma or metastatic adenopathy. Cardiothoracic surgical consultation suggested. 2. Intense hypermetabolism (max SUV 17.8) associated with the large irregular thick walled cavitary upper right lung mass, which crosses the major fissure with involvement of the posterior right upper lobe and superior segment right lower lobe, without frank chest wall invasion. Given the concomitant widespread hypermetabolic patchy tree-in-bud opacity in both lungs and the recent negative right lung mass biopsy, an infectious process due to mycobacterial (tuberculous or nontuberculous) disease is suggested, although a component of malignancy cannot be excluded in the  right upper lobe lung mass. Consider close imaging follow-up versus repeat tissue sampling. 3. Nonspecific mildly hypermetabolic mildly enlarged lymph nodes anterior to the carina and in the right infrahilar region. 4. No extrathoracic or osseous hypermetabolic metastatic disease. 5. Chronic findings include: Aortic Atherosclerosis (ICD10-I70.0) and Emphysema (ICD10-J43.9). Mild sigmoid diverticulosis.  Electronically Signed   By: Ilona Sorrel M.D.   On: 01/23/2019 16:01   Dg C-arm Bronchoscopy  Result Date: 01/18/2019 C-ARM BRONCHOSCOPY: Fluoroscopy was utilized by the requesting physician.  No radiographic interpretation.    ASSESSMENT AND PLAN: This is a very pleasant 78 years old white female with highly suspicious metastatic neoplasm presented with hypermetabolic anterior mediastinal soft tissue mass worrisome for thymic malignancy as well as hypermetabolic large irregular thick-walled cavitary right upper lobe lung mass crossing the major fissure with also concomitant widespread hypermetabolic patchy tree-in-bud opacity in both lungs again suspicious for neoplasm versus inflammatory process. I personally and independently reviewed the scan images and discussed the result and showed the images to the patient and her family. I recommended for the patient to see Dr. Tamala Julian on February 10, 2019 as previously planned for reevaluation and consideration of rebiopsy.  If the patient has no accessible lesion for biopsy by bronchoscopy, she may benefit from referral to cardiothoracic surgery for biopsy of the thymic lesion. I will arrange for the patient a follow-up appointment with me after her evaluation by pulmonary medicine and surgery if needed. The patient was advised to call immediately if she has any concerning symptoms in the interval. I discussed the assessment and treatment plan with the patient. The patient was provided an opportunity to ask questions and all were answered. The patient agreed with the plan and demonstrated an understanding of the instructions.   The patient was advised to call back or seek an in-person evaluation if the symptoms worsen or if the condition fails to improve as anticipated.  I provided 25 minutes of face-to-face video visit time during this encounter, and > 50% was spent counseling as documented under my assessment & plan.  Eilleen Kempf, MD 01/31/2019  12:15 PM  Disclaimer: This note was dictated with voice recognition software. Similar sounding words can inadvertently be transcribed and may not be corrected upon review.

## 2019-02-01 NOTE — Telephone Encounter (Signed)
Called and left VM with daughter, I already spoke about plan at length with patient but I guess this was not relayed to daughter.

## 2019-02-03 NOTE — Telephone Encounter (Signed)
Still nothing growing on bronch.  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  Will ask [facility] to consider mediastinoscopy for biopsy of anterior mediastinal mass to hopefully give [place] an answer.  This was relayed to patient.

## 2019-02-03 NOTE — Addendum Note (Signed)
Addended by: Ina Homes on: 02/03/2019 04:40 PM   Modules accepted: Orders

## 2019-02-07 ENCOUNTER — Other Ambulatory Visit: Payer: Self-pay | Admitting: Thoracic Surgery (Cardiothoracic Vascular Surgery)

## 2019-02-07 ENCOUNTER — Other Ambulatory Visit: Payer: Self-pay

## 2019-02-07 ENCOUNTER — Institutional Professional Consult (permissible substitution) (INDEPENDENT_AMBULATORY_CARE_PROVIDER_SITE_OTHER): Payer: Medicare Other | Admitting: Thoracic Surgery (Cardiothoracic Vascular Surgery)

## 2019-02-07 ENCOUNTER — Encounter (HOSPITAL_COMMUNITY): Payer: Self-pay | Admitting: Internal Medicine

## 2019-02-07 VITALS — BP 110/65 | HR 76 | Temp 98.4°F | Resp 20 | Ht 61.5 in | Wt 90.0 lb

## 2019-02-07 DIAGNOSIS — J9859 Other diseases of mediastinum, not elsewhere classified: Secondary | ICD-10-CM

## 2019-02-07 DIAGNOSIS — R918 Other nonspecific abnormal finding of lung field: Secondary | ICD-10-CM

## 2019-02-07 NOTE — Progress Notes (Signed)
PCP is [name], MD Referring Provider is Ina Homes, MD/ Curt Bears, MD  Chief Complaint  Patient presents with  . Mediastinal Mass    Surgical eval for mediastinoscopy and BX, PET Scan 01/23/19, Chest CT 01/06/19     HPI: Olivia Hooper is sent for consultation regarding a right upper lobe mass and an anterior mediastinal mass.  Olivia Hooper is a 78 year old woman with a history of tobacco abuse, COPD, severe rheumatoid arthritis, left breast cancer, hyperparathyroidism, hyperlipidemia, osteoporosis, and vertigo.  She presented with about a 1 month history of hemoptysis.  She says she was coughing up small amounts of pink fluid.  She was not sure that this was blood at first and delayed seeking medical care initially.  She finally went to her doctor.  The chest x-ray showed right upper lobe lung mass.  A CT of the chest showed a 6.5 x 5.2 x 7.4 cm cavitary mass of the right upper lobe .  There were multiple small lung nodules bilaterally.  No mention was made of an anterior mediastinal mass.  She underwent bronchoscopy and endobronchial ultrasound by Dr. Tamala Julian.  Cytologies were negative as were AFB and fungal stains.  Cultures are still pending for final result.  A PET/CT showed the upper lobe cavitary mass was markedly hypermetabolic.  There also was extensive patchy tree-in-bud opacity in both lungs that was hypermetabolic as well.  There was a 1.2 cm.  Tracheal node that had mild activity.  There also was a 3.9 x 1.6 cm anterior mediastinal soft tissue mass with an SUV of 8.5.  Her hemoptysis has resolved.  She does get short of breath with exertion.  She complains of decreased energy.  She has chronic vertigo.  She has severe arthritis.  She has not had any significant change in appetite or weight loss.  She smoked about a pack a day for 65 years before quitting 3 weeks ago. Zubrod Score: At the time of surgery this patient's most appropriate activity status/level should be described  as: []     0    Normal activity, no symptoms [x]     1    Restricted in physical strenuous activity but ambulatory, able to do out light work []     2    Ambulatory and capable of self care, unable to do work activities, up and about >50 % of waking hours                              []     3    Only limited self care, in bed greater than 50% of waking hours []     4    Completely disabled, no self care, confined to bed or chair []     5    Moribund  Past Medical History:  Diagnosis Date  . Breast cancer ([place])    on left  . Carpal tunnel syndrome   . COPD (chronic obstructive pulmonary disease) ([place])   . Dyspnea   . Hypercalcemia   . Hyperlipidemia   . Hyperparathyroidism ([place])   . Osteoporosis   . Pneumonia   . RA (rheumatoid arthritis) ([place])   . Vertigo   . Vitamin D deficiency     Past Surgical History:  Procedure Laterality Date  . CARPAL TUNNEL RELEASE    . CATARACT EXTRACTION    . MASTECTOMY Left   . VESICOVAGINAL FISTULA CLOSURE W/ TAH    . VIDEO BRONCHOSCOPY  WITH ENDOBRONCHIAL ULTRASOUND N/A 01/18/2019   Procedure: VIDEO BRONCHOSCOPY WITH ENDOBRONCHIAL ULTRASOUND;  Surgeon: Candee Furbish, MD;  Location: [facility] OR;  Service: Thoracic;  Laterality: N/A;    Family History  Problem Relation Age of Onset  . Heart disease Father   . Bone cancer Father   . Emphysema Father        never smoker  . Heart disease Mother   . Colon cancer Mother   . Melanoma Sister   . Kidney cancer Brother     Social History Social History   Tobacco Use  . Smoking status: Current Every Day Smoker    Packs/day: 0.25    Years: 62.00    Pack years: 15.50    Types: Cigarettes  . Smokeless tobacco: Never Used  Substance Use Topics  . Alcohol use: No    Alcohol/week: 0.0 standard drinks  . Drug use: No    Current Outpatient Medications  Medication Sig Dispense Refill  . albuterol (VENTOLIN HFA) 108 (90 Base) MCG/ACT inhaler Inhale 2 puffs into the lungs every 6 (six) hours as needed for  wheezing or shortness of breath.     . Certolizumab Pegol (CIMZIA [place]) Inject 2 Doses/Fill into the skin every 28 (twenty-eight) days.    Marland Kitchen denosumab (PROLIA) 60 MG/ML SOSY injection Inject 60 mg into the skin every 6 (six) months.    . folic acid (FOLVITE) 1 MG tablet Take 3 mg by mouth See admin instructions. Take 3 mg daily for 6 days, skip dose on methotrexate day (Thursday)    . methotrexate 2.5 MG tablet Take 15 mg by mouth every Thursday.     . mometasone-formoterol (DULERA) 100-5 MCG/ACT AERO Inhale 2 puffs into the lungs 2 (two) times daily.    . nicotine (NICODERM CQ - DOSED IN MG/24 HR) 7 mg/24hr patch Place 1 patch (7 mg total) onto the skin daily. 30 patch 1  . Polyethyl Glycol-Propyl Glycol (SYSTANE OP) Place 1 drop into both eyes daily as needed (dry eyes).    . simvastatin (ZOCOR) 40 MG tablet Take 40 mg by mouth at bedtime.     . traMADol (ULTRAM) 50 MG tablet Take 50 mg by mouth 2 (two) times daily as needed for moderate pain.     Marland Kitchen triamcinolone ointment (KENALOG) 0.1 % Apply 1 application topically 2 (two) times daily as needed (dry skin).     . cefdinir (OMNICEF) 300 MG capsule Take 300 mg by mouth 2 (two) times daily.     No current facility-administered medications for this visit.     Allergies  Allergen Reactions  . Buprenorphine Hcl Nausea And Vomiting  . Morphine And Related Nausea And Vomiting         Review of Systems  Constitutional: Positive for activity change and fatigue. Negative for unexpected weight change.  HENT: Negative for trouble swallowing and voice change.   Respiratory: Positive for cough (Hemoptysis) and shortness of breath. Negative for wheezing.   Cardiovascular: Negative for chest pain and leg swelling.  Genitourinary: Negative for difficulty urinating and dyspareunia.  Musculoskeletal: Positive for arthralgias and joint swelling.  Neurological: Positive for dizziness. Negative for seizures, syncope and weakness.  Hematological: Negative  for adenopathy. Bruises/bleeds easily.  All other systems reviewed and are negative.   BP 110/65 (BP Location: Right Arm, Patient Position: Sitting, Cuff Size: Small)   Pulse 76   Temp 98.4 F (36.9 C) (Skin)   Resp 20   Ht 5' 1.5" (1.562 m)  Wt 90 lb (40.8 kg)   SpO2 92% Comment: RA  BMI 16.73 kg/m  Physical Exam Vitals signs reviewed.  Constitutional:      General: She is not in acute distress.    Comments: Frail-appearing elderly woman  HENT:     Head: Normocephalic and atraumatic.  Eyes:     General: No scleral icterus.    Extraocular Movements: Extraocular movements intact.  Neck:     Musculoskeletal: Neck supple.  Cardiovascular:     Rate and Rhythm: Normal rate and regular rhythm.     Heart sounds: Normal heart sounds. No murmur. No friction rub. No gallop.   Pulmonary:     Effort: Pulmonary effort is normal. No respiratory distress.     Breath sounds: Normal breath sounds. No wheezing or rales.  Abdominal:     General: There is no distension.     Palpations: Abdomen is soft.     Tenderness: There is no abdominal tenderness.  Musculoskeletal:        General: Deformity (Severe ulnar deviation) present.  Lymphadenopathy:     Cervical: No cervical adenopathy.  Skin:    General: Skin is warm and dry.  Neurological:     General: No focal deficit present.     Mental Status: She is oriented to person, place, and time.     Cranial Nerves: No cranial nerve deficit.    Diagnostic Tests: CT CHEST WITH CONTRAST  TECHNIQUE: Multidetector CT imaging of the chest was performed during intravenous contrast administration.  CONTRAST:  65mL OMNIPAQUE IOHEXOL 300 MG/ML  SOLN  COMPARISON:  Chest radiographs dated 04/26/2014. Chest CT dated 06/16/2010.  FINDINGS: Cardiovascular: Atheromatous calcifications, including the coronary arteries and aorta. Normal sized heart.  Mediastinum/Nodes: No enlarged mediastinal, hilar, or axillary lymph nodes. Thyroid gland,  trachea, and esophagus demonstrate no significant findings.  Lungs/Pleura: Large cavitary mass in the right upper lobe and superior segment right lower lobe, extending across the major fissure. This measures 6.5 x 5.2 cm on image number 45 series 3 and 7.4 cm in length on coronal image number 92 series 5. This has low density fluid or necrotic components posteriorly and superiorly.  The lungs are hyperexpanded with diffuse peribronchial thickening. There are multiple small nodules bilaterally. The largest is a cavitary nodule in the anterior aspect of the right middle lobe, measuring 1.3 x 1.1 cm on image number 81 series 3. The next largest is a 0.9 x 0.6 cm nodule in the left upper lobe on image number 72 series 3.  No pleural fluid. Mild diffuse bullous changes with a centrilobular pattern.  Upper Abdomen: Diffuse low density of the liver relative to the spleen. The liver is mildly heterogeneous with no discrete mass is visualized. Mildly prominent left renal pelvis and upper pole collecting system. The remainder of the right knee kidney is not included and only the superior aspect of the right kidney is included. Enlarged inferior vena cava and portal veins.  Musculoskeletal: Postmastectomy changes on the left with a retropectoral implant. No evidence of bony metastatic disease. Midthoracic vertebral compression deformities without acute fracture lines or significant bony retropulsion. Thoracic spine degenerative changes.  IMPRESSION: 1. 6.5 x 5.2 x 7.4 cm cavitary mass in the right upper lobe and superior segment right lower lobe, suspicious for a primary lung carcinoma. Fungal cavitary disease is less likely. 2. Multiple small bilateral lung nodules, as described above, suspicious for metastases. 3. Changes of COPD and chronic bronchitis. 4. Mild diffuse hepatic steatosis. 5. Mildly  prominent left renal pelvis and upper pole collecting system, possibly indicating  mild hydronephrosis. 6. Calcific coronary artery and aortic atherosclerosis.  Aortic Atherosclerosis (ICD10-I70.0) and Emphysema (ICD10-J43.9).   Electronically Signed   By: Claudie Revering M.D.   On: 01/06/2019 11:27 NUCLEAR MEDICINE PET SKULL BASE TO THIGH  TECHNIQUE: 5.1 mCi F-18 FDG was injected intravenously. Full-ring PET imaging was performed from the skull base to thigh after the radiotracer. CT data was obtained and used for attenuation correction and anatomic localization.  Fasting blood glucose: 88 mg/dl  COMPARISON:  01/06/2019 chest CT.  08/29/2015 CT abdomen/pelvis.  FINDINGS: Mediastinal blood pool activity: SUV max 2.1  Liver activity: SUV max NA  NECK: No hypermetabolic lymph nodes in the neck. Curvilinear muscular hypermetabolism in the left sternocleidomastoid muscle and upper right neck prevertebral musculature is nonspecific and probably activity related.  Incidental CT findings: none  CHEST:  Intense hypermetabolism (max SUV 17.8) associated with the large irregular thick-walled cavitary upper right lung 7.1 x 5.8 cm mass, which crosses the major fissure with involvement of the posterior right upper lobe and superior segment right lower lobe.  There is hypermetabolism associated with extensive patchy tree-in-bud opacity throughout both lungs, most prominent in the left upper lobe and right lower lobe. Representative max SUV 5.1 associated with anterior left upper lobe patchy tree-in-bud opacity (series 8/image 32). Low level uptake associated with anterior basilar right upper lobe 1.5 cm cavitary nodule with max SUV 1.9 (series 8/image 37).  Mildly hypermetabolic mildly enlarged 1.2 cm mediastinal node anterior to the carina with max SUV 3.5 (series 4/image 64). Mildly hypermetabolic right infrahilar node with max SUV 3.8.  Hypermetabolic 3.9 x 1.6 cm anterior mediastinal soft tissue mass with max SUV 8.5 (series 4/image 58), new  since 02/04/2011 chest CT.  Incidental CT findings: Moderate centrilobular emphysema. Atherosclerotic nonaneurysmal thoracic aorta. Left breast prosthesis.  ABDOMEN/PELVIS: No abnormal hypermetabolic activity within the liver, pancreas, adrenal glands, or spleen. No hypermetabolic lymph nodes in the abdomen or pelvis.  Incidental CT findings: Granulomatous calcifications throughout the liver and spleen. Atherosclerotic nonaneurysmal abdominal aorta. Mild sigmoid diverticulosis.  SKELETON: No focal hypermetabolic activity to suggest skeletal metastasis.  Incidental CT findings: none  IMPRESSION: 1. Hypermetabolic 3.9 x 1.6 cm anterior mediastinal soft tissue mass, new since 2012 chest CT, most worrisome for thymic malignancy. Differential includes lymphoma or metastatic adenopathy. Cardiothoracic surgical consultation suggested. 2. Intense hypermetabolism (max SUV 17.8) associated with the large irregular thick walled cavitary upper right lung mass, which crosses the major fissure with involvement of the posterior right upper lobe and superior segment right lower lobe, without frank chest wall invasion. Given the concomitant widespread hypermetabolic patchy tree-in-bud opacity in both lungs and the recent negative right lung mass biopsy, an infectious process due to mycobacterial (tuberculous or nontuberculous) disease is suggested, although a component of malignancy cannot be excluded in the right upper lobe lung mass. Consider close imaging follow-up versus repeat tissue sampling. 3. Nonspecific mildly hypermetabolic mildly enlarged lymph nodes anterior to the carina and in the right infrahilar region. 4. No extrathoracic or osseous hypermetabolic metastatic disease. 5. Chronic findings include: Aortic Atherosclerosis (ICD10-I70.0) and Emphysema (ICD10-J43.9). Mild sigmoid diverticulosis.   Electronically Signed   By: Ilona Sorrel M.D.   On: 01/23/2019 16:01 I  personally reviewed the CT and PET/CT images and concur with the findings noted above  Impression: Olivia Hooper is a 77 year old woman with multiple medical problems including a history of tobacco abuse, COPD, severe rheumatoid arthritis, left  breast cancer, hyperparathyroidism, hyperlipidemia, osteoporosis, and vertigo.  She presented with hemoptysis.  Work-up has shown a large cavitary mass in the right upper lobe, multiple smaller pulmonary nodules, as well as tree-in-bud opacity in both lungs.  In addition she has been found to have an anterior mediastinal mass.  The anterior mediastinal mass is most likely not related to her lung process.  Differential diagnosis includes thymoma, lymphoma, and germ cell tumors.  It would be a highly unusual appearance for reactive adenopathy.  That lesion is not accessible with mediastinoscopy.  It is possible it can be accessed with a Chamberlain procedure, but is very superior under the manubrium and would be difficult to access from that approach.  Most likely she would need a surgical resection via a partial sternotomy.  Before we can consider that we need to try to establish what is going on in the lungs.  She has a large cavitary mass with multiple smaller nodules.  There also has extensive tree-in-bud opacity which is suggestive of infection.  TB, atypical mycobacteria, and fungal organisms were all in the differential diagnosis.  Her biopsies from bronchoscopy showed no evidence of malignancy, but that has not been ruled out by a single negative bronchoscopy.  I suspect this is infectious, but cannot rule out the possibility of an underlying malignancy.  Her initial AFB and fungal stains were negative, but the final cultures are not done yet.  The samples were sent on 01/18/2019.  I would want to wait at least a month to see if 1 of those cultures turns positive.  If those are nondiagnostic I think the next step would be to repeat her bronchoscopy with  electromagnetic navigation to be able to sample different areas on the primary lesion and and possibly some of the other areas noted on scan as well.  In the meantime while we await those culture results I am going to order QuantiFERON gold and fungal antibody panel to see if that sheds any light.  Plan: QuantiFERON gold and fungal antigen testing Return in 2 weeks to discuss final AFB and fungal culture results Consider repeat bronchoscopy with electromagnetic navigation if cultures negative.  Melrose Nakayama, MD [facility] [phone]

## 2019-02-08 DIAGNOSIS — J9859 Other diseases of mediastinum, not elsewhere classified: Secondary | ICD-10-CM | POA: Diagnosis not present

## 2019-02-09 ENCOUNTER — Other Ambulatory Visit: Payer: Self-pay | Admitting: *Deleted

## 2019-02-09 NOTE — Progress Notes (Signed)
The proposed treatment discussed in cancer conference 02/09/2019 is for discussion purpose only and is not a binding recommendation.  The patient was not physically examined nor present for their treatment options.  Therefore, final treatment plans cannot be decided.  

## 2019-02-10 ENCOUNTER — Ambulatory Visit: Payer: Medicare Other | Admitting: Internal Medicine

## 2019-02-17 ENCOUNTER — Ambulatory Visit: Payer: Medicare Other | Admitting: Podiatry

## 2019-02-17 ENCOUNTER — Telehealth: Payer: Self-pay | Admitting: Thoracic Surgery (Cardiothoracic Vascular Surgery)

## 2019-02-17 NOTE — Telephone Encounter (Signed)
      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 Roxan Hockey, MD [facility] [phone]

## 2019-02-19 ENCOUNTER — Telehealth: Payer: Self-pay | Admitting: Internal Medicine

## 2019-02-19 NOTE — Telephone Encounter (Signed)
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  DNA probe negative.  Now going to sequencing to determine speciation, this may take up to month or two.  In meantime, patient instructed to stop anti-TNF drug and ask rheumatologist about MTX.  Once speciation back we can determine appropriate drug regimen which will likely need to be continued for at least a year.  Called patient to update and sent message to Dr. Derek Mound MD Pulmonology

## 2019-02-21 ENCOUNTER — Ambulatory Visit: Payer: Medicare Other | Admitting: Thoracic Surgery (Cardiothoracic Vascular Surgery)

## 2019-02-23 ENCOUNTER — Other Ambulatory Visit: Payer: Self-pay | Admitting: Thoracic Surgery (Cardiothoracic Vascular Surgery)

## 2019-02-23 ENCOUNTER — Other Ambulatory Visit: Payer: Self-pay

## 2019-02-23 ENCOUNTER — Ambulatory Visit (INDEPENDENT_AMBULATORY_CARE_PROVIDER_SITE_OTHER): Payer: Medicare Other | Admitting: Thoracic Surgery (Cardiothoracic Vascular Surgery)

## 2019-02-23 VITALS — BP 140/70 | HR 88 | Temp 97.7°F | Resp 20 | Ht 61.5 in | Wt 90.0 lb

## 2019-02-23 DIAGNOSIS — R918 Other nonspecific abnormal finding of lung field: Secondary | ICD-10-CM | POA: Diagnosis not present

## 2019-02-23 NOTE — Progress Notes (Signed)
[address]       [place] [address]             [phone]     HPI: [name] returns to further discuss management of her pulmonary and mediastinal findings.  Olivia Hooper is a 78 year old woman with a history of tobacco abuse, COPD, severe rheumatoid arthritis, left breast cancer, hyperparathyroidism, hyperlipidemia, osteoporosis, and vertigo.  She presented with a 1 month history of hemoptysis.  This was mild.  She was not even sure it was blood at first, but it did not resolve.  She had a chest x-ray which showed a right upper lobe lung mass.  On CT she had a 6.5 x 5.2 x 7.4 cm cavitary mass in the right upper lobe.  There were multiple small lung nodules bilaterally.  There also was a small anterior mediastinal mass.  Dr. Tamala Julian did bronchoscopy and endobronchial ultrasound.  Cytologies, AFB, and fungal stains were all negative.  A PET CT was done which showed the cavitary mass was markedly hypermetabolic.  There was patchy tree-in-bud opacity in both lungs bilaterally.  That was hypermetabolic as well.  There was a 3.9 x 1.6 cm anterior mediastinal soft tissue mass that was hypermetabolic with an SUV of 8.5.  Her AFB cultures did grow an organism.  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  Past Medical History:  Diagnosis Date  . Breast cancer ([place])    on left  . Carpal tunnel syndrome   . COPD (chronic obstructive pulmonary disease) ([place])   . Dyspnea   . Hypercalcemia   . Hyperlipidemia   . Hyperparathyroidism ([place])   . Osteoporosis   . Pneumonia   . RA (rheumatoid arthritis) ([place])   . Vertigo   . Vitamin D deficiency     Current Outpatient Medications  Medication Sig Dispense Refill  . albuterol (VENTOLIN HFA) 108 (90 Base) MCG/ACT inhaler Inhale 2 puffs into the lungs every 6 (six) hours as needed for wheezing or shortness of breath.     . Certolizumab Pegol (CIMZIA [place]) Inject 2 Doses/Fill into the skin every 28  (twenty-eight) days.    Marland Kitchen denosumab (PROLIA) 60 MG/ML SOSY injection Inject 60 mg into the skin every 6 (six) months.    . folic acid (FOLVITE) 1 MG tablet Take 3 mg by mouth See admin instructions. Take 3 mg daily for 6 days, skip dose on methotrexate day (Thursday)    . methotrexate 2.5 MG tablet Take 15 mg by mouth every Thursday.     . mometasone-formoterol (DULERA) 100-5 MCG/ACT AERO Inhale 2 puffs into the lungs 2 (two) times daily.    . nicotine (NICODERM CQ - DOSED IN MG/24 HR) 7 mg/24hr patch Place 1 patch (7 mg total) onto the skin daily. 30 patch 1  . Polyethyl Glycol-Propyl Glycol (SYSTANE OP) Place 1 drop into both eyes daily as needed (dry eyes).    . simvastatin (ZOCOR) 40 MG tablet Take 40 mg by mouth at bedtime.     . traMADol (ULTRAM) 50 MG tablet Take 50 mg by mouth 2 (two) times daily as needed for moderate pain.     Marland Kitchen triamcinolone ointment (KENALOG) 0.1 % Apply 1 application topically 2 (two) times daily as needed (dry skin).      No current facility-administered medications for this visit.    Physical Exam BP 140/70   Pulse 88   Temp 97.7 F (36.5 C) (Skin)   Resp 20  Ht 5' 1.5" (1.562 m)   Wt 90 lb (40.8 kg)   SpO2 93% Comment: RA  BMI 16.73 kg/m  Thin 78 year old woman in no acute distress Alert and oriented x3 with no focal deficits Wearing surgical mask Lungs clear bilaterally, no rales or wheezing  Diagnostic Tests: I again reviewed the CT and PET/CT images.  Reviewed microbiology results.  Impression: Olivia Hooper is a 78 year old woman with a history of tobacco abuse, COPD, rheumatoid arthritis, left breast cancer, hyperparathyroidism, hyperlipidemia, osteoporosis, and vertigo.  She presented with hemoptysis and was found to have a large cavitary mass in the right upper lobe as well as multiple smaller pulmonary nodules bilaterally.  There also was tree-in-bud opacity in both lungs.  Differential diagnosis includes cancer, but the picture is really  more consistent with an infectious etiology.  She is on both Prolia and Cimzia for her rheumatoid arthritis.  Therefore, she is at least at some risk for an opportunistic infection.  She really needs an infectious disease consult to further evaluate that.  I also recommended that she talk with Dr. Dossie Der her rheumatologist about whether to continue or discontinue the Prolia and Cimzia.  She does have an anterior mediastinal mass.  This could all be related to her mycobacterial infection.  I still cannot rule out the possibility that this is a thymoma that just happened to be found incidentally.  I would want to put her through a major operation given her age and comorbidities if that is just reactive or infectious adenopathy.  I recommended her that we get her started on treatment for the Mycobacterium abscessus and then reevaluate the anterior mediastinal mass with a another CT scan in about 6 to 8 weeks.  Plan: Infectious disease referral Follow-up with Dr. Tamala Julian Follow-up with Dr. Dossie Der Return in 6 weeks with a CT of chest  Melrose Nakayama, MD [facility] [phone]

## 2019-02-24 DIAGNOSIS — M79671 Pain in right foot: Secondary | ICD-10-CM | POA: Diagnosis not present

## 2019-02-24 DIAGNOSIS — M0589 Other rheumatoid arthritis with rheumatoid factor of multiple sites: Secondary | ICD-10-CM | POA: Diagnosis not present

## 2019-02-24 DIAGNOSIS — A312 Disseminated mycobacterium avium-intracellulare complex (DMAC): Secondary | ICD-10-CM | POA: Diagnosis not present

## 2019-02-24 DIAGNOSIS — E213 Hyperparathyroidism, unspecified: Secondary | ICD-10-CM | POA: Diagnosis not present

## 2019-02-24 DIAGNOSIS — E559 Vitamin D deficiency, unspecified: Secondary | ICD-10-CM | POA: Diagnosis not present

## 2019-02-24 DIAGNOSIS — E611 Iron deficiency: Secondary | ICD-10-CM | POA: Diagnosis not present

## 2019-02-24 DIAGNOSIS — R918 Other nonspecific abnormal finding of lung field: Secondary | ICD-10-CM | POA: Diagnosis not present

## 2019-03-02 LAB — ACID FAST CULTURE WITH REFLEXED SENSITIVITIES (MYCOBACTERIA): Acid Fast Culture: NEGATIVE

## 2019-03-09 LAB — RAPID GROWER BROTH SUSCEP.
Amikacin: 4
Ciprofloxacin: >4
Clarithromycin: 16
Doxycycline: 16
Linezolid: 4
Minocycline: 8
Tigecycline: 0.06

## 2019-03-09 LAB — FUNGUS CULTURE WITH STAIN

## 2019-03-09 LAB — AFB ORGANISM ID BY DNA PROBE
M avium complex: NEGATIVE
M tuberculosis complex: NEGATIVE

## 2019-03-09 LAB — ACID FAST SMEAR (AFB, MYCOBACTERIA): Acid Fast Smear: NEGATIVE

## 2019-03-09 LAB — FUNGUS CULTURE RESULT

## 2019-03-09 LAB — FUNGAL ORGANISM REFLEX

## 2019-03-09 LAB — ACID FAST CULTURE WITH REFLEXED SENSITIVITIES (MYCOBACTERIA): Acid Fast Culture: POSITIVE — AB

## 2019-03-09 LAB — ORG ID BY SEQUENCING RFLX AST

## 2019-03-14 ENCOUNTER — Encounter: Payer: Self-pay | Admitting: Infectious Disease

## 2019-03-14 ENCOUNTER — Other Ambulatory Visit: Payer: Self-pay

## 2019-03-14 ENCOUNTER — Ambulatory Visit (INDEPENDENT_AMBULATORY_CARE_PROVIDER_SITE_OTHER): Payer: Medicare Other | Admitting: Infectious Disease

## 2019-03-14 ENCOUNTER — Telehealth: Payer: Self-pay | Admitting: *Deleted

## 2019-03-14 VITALS — BP 173/64 | HR 89 | Temp 98.1°F | Ht 61.5 in | Wt 92.0 lb

## 2019-03-14 DIAGNOSIS — F17210 Nicotine dependence, cigarettes, uncomplicated: Secondary | ICD-10-CM

## 2019-03-14 DIAGNOSIS — A310 Pulmonary mycobacterial infection: Secondary | ICD-10-CM

## 2019-03-14 DIAGNOSIS — J984 Other disorders of lung: Secondary | ICD-10-CM

## 2019-03-14 HISTORY — DX: Pulmonary mycobacterial infection: A31.0

## 2019-03-14 MED ORDER — LINEZOLID 600 MG PO TABS: 600.0000 mg | ORAL_TABLET | Freq: Two times a day (BID) | ORAL | 11 refills | Status: DC

## 2019-03-14 NOTE — Progress Notes (Signed)
Reason for Consult: Mycobacterium abscessus infection  Requesting Physician: Dr. Roxan Hockey   Subjective:    Patient ID: Olivia Hooper, female    DOB: 1940/11/03, 79 y.o.   MRN: [id number]  HPI  79 year old Caucasian lady with history of tobacco use chronic obstructive pulmonary disease severe rheumatoid arthritis, left breast cancer who has been on immunosuppressive therapy.  She apparently had a 1 month history of hemoptysis.  X-ray had shown a right upper lobe lung mass CT had shown 6.5 x 5.2 x 7.47 manner cavitary mass in the right upper lobe.  There are multiple small lung nodules as well.  There is also a small anterior mediastinal mass.  Patient underwent bronchoscopy with Dr. Tamala Julian with cytology is AFB fungal stains and cultures.  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  In the interval she had a PET CT scan that showed the cavitary mass which was very hypermetabolic patchy tree-in-bud opacities in both lungs there were hypermetabolic and a 3.9 1.6 cm anterior mediastinal soft tissue mass that was hypermetabolic.  There was anxiety about patient potentially having a thymoma but at present she was felt to be a high risk surgical patient when seen by Dr. Salomon Fick with cardiothoracic surgery.  I also agree with him that this mediastinal mass could be due to the mycobacterial abscessus infection.  These organisms are not notoriously difficult to treat still to have some anxiety the patient might need some surgical intervention to gain control of her chest pathology.  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  She has lost 10 pounds of weight over the last half year.  She has stopped smoking now.   Past Medical History:  Diagnosis Date  . Breast cancer ([place])    on left  . Carpal tunnel syndrome   . COPD (chronic obstructive  pulmonary disease) ([place])   . Dyspnea   . Hypercalcemia   . Hyperlipidemia   . Hyperparathyroidism ([place])   . Osteoporosis   . Pneumonia   . Pulmonary mycobacterial infection ([place]) 03/14/2019  . RA (rheumatoid arthritis) ([place])   . Vertigo   . Vitamin D deficiency     Past Surgical History:  Procedure Laterality Date  . CARPAL TUNNEL RELEASE    . CATARACT EXTRACTION    . MASTECTOMY Left   . VESICOVAGINAL FISTULA CLOSURE W/ TAH    . VIDEO BRONCHOSCOPY WITH ENDOBRONCHIAL ULTRASOUND N/A 01/18/2019   Procedure: VIDEO BRONCHOSCOPY WITH ENDOBRONCHIAL ULTRASOUND;  Surgeon: Candee Furbish, MD;  Location: [facility] OR;  Service: Thoracic;  Laterality: N/A;    Family History  Problem Relation Age of Onset  . Heart disease Father   . Bone cancer Father   . Emphysema Father        never smoker  . Heart disease Mother   . Colon cancer Mother   . Melanoma Sister   . Kidney cancer Brother       Social History   Socioeconomic History  . Marital status: Widowed    Spouse name: Not on file  . Number of children: Not on file  . Years of education: Not on file  . Highest education level: Not on file  Occupational History  . Occupation: Retired   Tobacco Use  . Smoking status: Current Every Day Smoker    Packs/day: 0.25    Years: 62.00    Pack years: 15.50    Types:  Cigarettes  . Smokeless tobacco: Never Used  Substance and Sexual Activity  . Alcohol use: No    Alcohol/week: 0.0 standard drinks  . Drug use: No  . Sexual activity: Not on file  Other Topics Concern  . Not on file  Social History Narrative  . Not on file   Social Determinants of Health   Financial Resource Strain:   . Difficulty of Paying Living Expenses: Not on file  Food Insecurity:   . Worried About Charity fundraiser in the Last Year: Not on file  . Ran Out of Food in the Last Year: Not on file  Transportation Needs:   . Lack of Transportation (Medical): Not on file  . Lack of Transportation (Non-Medical):  Not on file  Physical Activity:   . Days of Exercise per Week: Not on file  . Minutes of Exercise per Session: Not on file  Stress:   . Feeling of Stress : Not on file  Social Connections:   . Frequency of Communication with Friends and Family: Not on file  . Frequency of Social Gatherings with Friends and Family: Not on file  . Attends Religious Services: Not on file  . Active Member of Clubs or Organizations: Not on file  . Attends Archivist Meetings: Not on file  . Marital Status: Not on file    Allergies  Allergen Reactions  . Buprenorphine Hcl Nausea And Vomiting  . Morphine And Related Nausea And Vomiting          Current Outpatient Medications:  .  albuterol (VENTOLIN HFA) 108 (90 Base) MCG/ACT inhaler, Inhale 2 puffs into the lungs every 6 (six) hours as needed for wheezing or shortness of breath. , Disp: , Rfl:  .  Certolizumab Pegol (CIMZIA ), Inject 2 Doses/Fill into the skin every 28 (twenty-eight) days., Disp: , Rfl:  .  denosumab (PROLIA) 60 MG/ML SOSY injection, Inject 60 mg into the skin every 6 (six) months., Disp: , Rfl:  .  folic acid (FOLVITE) 1 MG tablet, Take 3 mg by mouth See admin instructions. Take 3 mg daily for 6 days, skip dose on methotrexate day (Thursday), Disp: , Rfl:  .  methotrexate 2.5 MG tablet, Take 15 mg by mouth every Thursday. , Disp: , Rfl:  .  mometasone-formoterol (DULERA) 100-5 MCG/ACT AERO, Inhale 2 puffs into the lungs 2 (two) times daily., Disp: , Rfl:  .  nicotine (NICODERM CQ - DOSED IN MG/24 HR) 7 mg/24hr patch, Place 1 patch (7 mg total) onto the skin daily., Disp: 30 patch, Rfl: 1 .  Polyethyl Glycol-Propyl Glycol (SYSTANE OP), Place 1 drop into both eyes daily as needed (dry eyes)., Disp: , Rfl:  .  simvastatin (ZOCOR) 40 MG tablet, Take 40 mg by mouth at bedtime. , Disp: , Rfl:  .  traMADol (ULTRAM) 50 MG tablet, Take 50 mg by mouth 2 (two) times daily as needed for moderate pain. , Disp: , Rfl:  .  triamcinolone  ointment (KENALOG) 0.1 %, Apply 1 application topically 2 (two) times daily as needed (dry skin). , Disp: , Rfl:    Review of Systems  Constitutional: Positive for fatigue and unexpected weight change. Negative for chills and fever.  HENT: Negative for congestion and sore throat.   Eyes: Negative for photophobia.  Respiratory: Positive for cough. Negative for shortness of breath and wheezing.   Cardiovascular: Negative for chest pain, palpitations and leg swelling.  Gastrointestinal: Negative for abdominal pain, blood in stool, constipation,  diarrhea, nausea and vomiting.  Genitourinary: Negative for dysuria, flank pain and hematuria.  Musculoskeletal: Negative for back pain and myalgias.  Skin: Negative for rash.  Neurological: Negative for dizziness, weakness and headaches.  Hematological: Does not bruise/bleed easily.  Psychiatric/Behavioral: Negative for behavioral problems, confusion, decreased concentration, dysphoric mood and suicidal ideas. The patient is nervous/anxious.        Objective:   Physical Exam Constitutional:      Appearance: She is underweight. She is not diaphoretic.  HENT:     Head: Normocephalic and atraumatic.     Right Ear: External ear normal.     Left Ear: External ear normal.     Nose: Nose normal.     Mouth/Throat:     Pharynx: No oropharyngeal exudate.  Eyes:     General: No scleral icterus.    Conjunctiva/sclera: Conjunctivae normal.     Pupils: Pupils are equal, round, and reactive to light.  Cardiovascular:     Rate and Rhythm: Normal rate and regular rhythm.     Heart sounds: Normal heart sounds. No murmur. No friction rub. No gallop.   Pulmonary:     Effort: Pulmonary effort is normal. No respiratory distress.     Breath sounds: No wheezing or rales.  Abdominal:     General: Bowel sounds are normal. There is no distension.     Palpations: Abdomen is soft.     Tenderness: There is no abdominal tenderness. There is no rebound.    Musculoskeletal:        General: No tenderness. Normal range of motion.     Cervical back: Normal range of motion and neck supple.  Lymphadenopathy:     Cervical: No cervical adenopathy.  Skin:    General: Skin is warm and dry.     Coloration: Skin is not pale.     Findings: No erythema or rash.  Neurological:     General: No focal deficit present.     Mental Status: She is alert and oriented to person, place, and time.     Coordination: Coordination normal.  Psychiatric:        Attention and Perception: Attention and perception normal.        Mood and Affect: Affect normal. Mood is anxious.        Speech: Speech normal.        Behavior: Behavior normal.        Thought Content: Thought content normal.        Cognition and Memory: Cognition and memory normal.        Judgment: Judgment normal.           Assessment & Plan:   Pulmonary Mycobacterium abscessus infection: She is certainly high risk for this given her COPD and immunosuppressive therapy.  She has been taken off of Biologics at this point in time but still on prednisone.  I do have some anxiety whether she may need some cardiothoracic surgical help with control of her significant pulmonary pathology in particular if the area that was thought to be a thymoma represents a lung abscess.  In the interval we are going to initiate 3 drug therapy.  We will plan on placing a PICC line.  We will use IV cefoxitin along with oral Zyvox and oral clofazimine once we can attain this from [place]  She is NOT to start any of the drugs until we have them all  I doubt that she will take the Zyvox for terribly long period of  time so we will need to find another drug to swap in which we may try to do with oral omadacycline  I DO NOT want to use amikacin with her given HIGH risk of ototoxicity and nephrotoxicity.  I have reviewed her CT scans and PET scan and agree with findings of cavitary pneumonia, mediastinal mass which may be an  abscess  We spent greater than 60 minutes with the patient including greater than 50% of time in face to face counsel of the patient and her daughter regarding the nature of this type of infection and how it is treated with multiple antibiotics the need to take all the antibiotics together side effects of potential about how IV cefoxitin has a continuous infusion will be given and coordination of her care

## 2019-03-14 NOTE — Telephone Encounter (Signed)
Orpah Melter, CPhT  Landis Gandy, RN  I have found patient assistance for Zyvox [employer]) and Elesa Hacker [facility] Enrollment). The patient will need to be at or below 400% of the poverty level which is $51,040 for #1 in household for [phone] for #2 in household.   I am not able to find assistance for Cefoxitin/Mefoxin, but will keep looking.   Venida Jarvis. Nadara Mustard [place] Patient [facility] for Infectious Disease  Phone: [phone]  Fax: [phone]

## 2019-03-14 NOTE — Telephone Encounter (Signed)
Dr Tommy Medal sent in order for PICC placement - appointment pending. RN gave verbal order to Debbie at [facility] to see out of pocket costs for cefoxitin infusion (6-12 months per Dr Tommy Medal).  Per Jackelyn Poling, this will be $241+/week until [place] hits her donut hole where she will have to pay the full cost of $483+/week until she hits the out of pocket maximum. Will send to pharmacy for advice. Landis Gandy, RN

## 2019-03-15 NOTE — Telephone Encounter (Signed)
Patient has $1k per month of out of pocket for abx then $2k per month when she hits doughnut hole.  Covering infusions, supplies and RN is the issue  Should we try to do clofazamine, zyvox and omadaycyclien instead of using an IV? This seems not likely to work but we could try. Thoughts?

## 2019-03-17 NOTE — Telephone Encounter (Signed)
I think that is reasonable given her other issues and comorbidities. We can get assistance for those most likely.

## 2019-03-20 ENCOUNTER — Telehealth: Payer: Self-pay | Admitting: Pharmacist

## 2019-03-20 ENCOUNTER — Telehealth: Payer: Self-pay | Admitting: *Deleted

## 2019-03-20 DIAGNOSIS — A310 Pulmonary mycobacterial infection: Secondary | ICD-10-CM

## 2019-03-20 NOTE — Telephone Encounter (Signed)
Oh excellent!

## 2019-03-20 NOTE — Telephone Encounter (Signed)
I know that [place] started the [employer] ap for clofazimine

## 2019-03-20 NOTE — Progress Notes (Signed)
Thanks so much Richardson Landry  Current problem is she cannot afford IV abx so we are cobbbling together some orals for her and hope that gives [place] some progress. If she doesn't then we can flip to IV she will be paying $1k to 2k per month pre and in doughnut hole. So dysfunctional all of these different medicare plans

## 2019-03-20 NOTE — Telephone Encounter (Signed)
Yes, as soon as I get her meds in order, I will have her come in!  Olivia Hooper - can we work on getting this lady's linezolid and omadacycline covered?

## 2019-03-20 NOTE — Telephone Encounter (Signed)
Thanks, Sharyn Lull! Once I get her clofazimine here and all of her other oral medications straight, I will let you know!

## 2019-03-20 NOTE — Telephone Encounter (Signed)
Completed and sent clofazimine application to [employer]. Will update Dr. Tommy Medal when approval status has been decided.

## 2019-03-20 NOTE — Telephone Encounter (Signed)
We could brng her in to start that all oral regimen and then see how long we can go without zyvox problems?

## 2019-03-20 NOTE — Telephone Encounter (Signed)
It's an online program now, so I submitted to [employer]!

## 2019-03-20 NOTE — Telephone Encounter (Signed)
Per Dr Tommy Medal, cancelled PICC placement appointment 1/14 (Left message with Anderson Malta in [place] notifying her).   RN spoke with Payton Doughty, let her know Dr Tommy Medal is working on an oral option, would like her to come in and meet with pharmacy to 1) discuss oral regimen and 2) pick up her clofazamine. Patient currently scheduled to follow up 2/24 with Dr Tommy Medal. RN will follow and let her know when we'd like her to come in with pharmacy. Patient verbalized understanding, is grateful for the oral option. If it comes to the expensive IV option, she will discuss how to cover this with her family, and is hopeful the oral option will reduce the length of IV treatment. Landis Gandy, RN

## 2019-03-21 NOTE — Telephone Encounter (Signed)
Patient wants Dr Tommy Medal to know that she is willing to do IV if that is the best option (her children all would help her cover the costs). She knows this is a complicated process and regimen, will follow his best advice. PICC cancelled at this time, as patient will need appointment only after the oral regimen is ready.

## 2019-03-21 NOTE — Telephone Encounter (Signed)
Given that pulmonary disease requires actually 12 months of therapy they may need to know the $ amounts involved for them to make an informed decision

## 2019-03-23 ENCOUNTER — Ambulatory Visit (HOSPITAL_COMMUNITY): Payer: Medicare Other

## 2019-03-23 ENCOUNTER — Telehealth: Payer: Self-pay | Admitting: Pharmacy Technician

## 2019-03-23 NOTE — Telephone Encounter (Addendum)
[facility] Patient Advocate Encounter   Received notification from [employer] [facility] that prior authorization for Zyvox is required.   PA submitted on 03/23/2019 Key [id number] Status is pending  If [facility] has not responded within the specified 3-day timeframe or if you have any questions about your PA submission, contact [place] Gem directly at [facility]) [phone] or ([place]) [phone].    [facility] will continue to follow.  Bartholomew Crews, CPhT Specialty Pharmacy Patient [facility] for Infectious Disease Phone: [phone] Fax: [phone] 03/23/2019 4:22 PM

## 2019-03-24 NOTE — Telephone Encounter (Signed)
[facility] Patient Advocate Encounter  Prior Authorization for Zyvox tablets has been approved.    Effective dates: 03/23/2019 through 06/21/2019 and will need an every 3 months renewal of the prior authorization  Patients co-pay is $30.00 for 30 days   [facility] will continue to follow.  Bartholomew Crews, CPhT Specialty Pharmacy Patient [facility] for Infectious Disease Phone: [phone] Fax: [phone] 03/24/2019 9:07 AM

## 2019-03-27 LAB — AFB CULTURE, BLOOD
MICRO NUMBER:: 1158163
SPECIMEN QUALITY:: ADEQUATE

## 2019-03-27 LAB — QUANTIFERON-TB GOLD PLUS
Mitogen-NIL: >10 IU/mL
NIL: 0.12 IU/mL
QuantiFERON-TB Gold Plus: NEGATIVE
TB1-NIL: <0 IU/mL
TB2-NIL: <0 IU/mL

## 2019-03-27 MED ORDER — AMBULATORY NON FORMULARY MEDICATION: 100.0000 mg | Freq: Every day | 2 refills | Status: DC

## 2019-03-27 NOTE — Telephone Encounter (Signed)
I like that option cefoxitin IF she is agreeable to start with and later we can look at [facility]

## 2019-03-27 NOTE — Addendum Note (Signed)
Addended by: Darletta Moll on: 03/27/2019 03:29 PM   Modules accepted: Orders

## 2019-03-27 NOTE — Telephone Encounter (Signed)
We have zyvox approved and clofazimine ready for her. Would you like for Sharyn Lull to set up a PICC for her to start cefoxitin?

## 2019-03-27 NOTE — Telephone Encounter (Signed)
FYI

## 2019-03-27 NOTE — Telephone Encounter (Signed)
Thanks so much [name]! 

## 2019-03-27 NOTE — Telephone Encounter (Signed)
Patient approved to receive clofazimine from [employer]. Medication will be shipped to the clinic.  Will f/u with patient for counseling and pick up once received.

## 2019-03-30 ENCOUNTER — Ambulatory Visit (INDEPENDENT_AMBULATORY_CARE_PROVIDER_SITE_OTHER): Payer: Medicare Other | Admitting: Podiatry

## 2019-03-30 ENCOUNTER — Encounter: Payer: Self-pay | Admitting: Podiatry

## 2019-03-30 ENCOUNTER — Other Ambulatory Visit (HOSPITAL_COMMUNITY): Payer: Self-pay | Admitting: Infectious Disease

## 2019-03-30 ENCOUNTER — Other Ambulatory Visit: Payer: Self-pay

## 2019-03-30 DIAGNOSIS — T148XXA Other injury of unspecified body region, initial encounter: Secondary | ICD-10-CM | POA: Diagnosis not present

## 2019-03-30 DIAGNOSIS — A310 Pulmonary mycobacterial infection: Secondary | ICD-10-CM

## 2019-03-30 NOTE — Telephone Encounter (Addendum)
Patient's daughter called for update.  RN let her know that pharmacy was still working on acquiring 1 oral antibiotic, and that we were finalizing her treatment plan specifics with Dr Tommy Medal.  RN set up patient for IV access - which option do you feel would be best as she lives alone and has limited dexterity and mobility due to being off therapy for her rheumatoid arthritis?  Interventional can place a port a cath or PICC or central line.  Ports take longer to place, so patient is tentatively scheduled for this 1/27, arrive 8 am (NPO after midnight).  She will need her first dose in short stay.   Notified [facility] of intention to move forward with cefoxitin pending start date. Orders are ready to send.  Per [facility], they will be sure the nursing agency can access port as well.  Scheduled patient with Pharmacy on Tuesday 1/26 at 11:30 to pick up oral medications and review treatment plan.  All of these appointments can be rescheduled.  Patient is aware and agreeable.  She is agreeable to any form of IV access, but would prefer to have her arms free if at all possible. Landis Gandy, RN

## 2019-03-31 ENCOUNTER — Other Ambulatory Visit: Payer: Self-pay | Admitting: Pharmacist

## 2019-03-31 DIAGNOSIS — A310 Pulmonary mycobacterial infection: Secondary | ICD-10-CM

## 2019-03-31 DIAGNOSIS — J984 Other disorders of lung: Secondary | ICD-10-CM

## 2019-03-31 MED ORDER — LINEZOLID 600 MG PO TABS: 600.0000 mg | ORAL_TABLET | Freq: Two times a day (BID) | ORAL | 11 refills | Status: DC

## 2019-03-31 MED FILL — LINEZOLID 600 MG TAB: 600 | 30 days supply | Qty: 60 | Fill #0

## 2019-03-31 NOTE — Progress Notes (Signed)
Rerouting to [facility].

## 2019-04-03 ENCOUNTER — Telehealth: Payer: Self-pay | Admitting: Pharmacist

## 2019-04-03 NOTE — Telephone Encounter (Signed)
[facility] Patient Advocate Encounter  Spoke to the patient and she would like to bring cash to her Tuesday appointment to pay the copay rather than a credit card over the phone. We will set her up an account at [facility] and have her medication at the clinic by Tuesday morning.   Bartholomew Crews, CPhT Specialty Pharmacy Patient [facility] for Infectious Disease Phone: [phone] Fax: [phone] 04/03/2019 9:18 AM

## 2019-04-03 NOTE — Progress Notes (Signed)
Subjective: 79 year old female presents the office today for concerns of 2 dark spots 1 issues of her big toes with the left side worse than right.  She thinks that this was a blood blister present on the last 2 weeks.  She denies any redness or drainage or any swelling coming from the area.  Denies any systemic complaints such as fevers, chills, nausea, vomiting. No acute changes since last appointment, and no other complaints at this time.   Objective: AAO x3, NAD DP/PT pulses palpable bilaterally, CRT less than 3 seconds Hyperkeratotic lesions which appear to be old dried blood present on both of her medial hallux IPJ.  No known ulceration drainage or any signs of infection.  There is no edema, erythema.  No open lesions or pre-ulcerative lesions.  No pain with calf compression, swelling, warmth, erythema  Assessment: Hemorrhagic blisters bilateral hallux  Plan: -All treatment options discussed with the patient including all alternatives, risks, complications.  -Discussed x-rays but she wanted to hold off.  I debrided the areas with any complications.  Recommend a small amount of antibiotic ointment dressing changes daily and dispensed offloading pads.  Monitor for any skin breakdown or signs or symptoms of infection. -Patient encouraged to call the office with any questions, concerns, change in symptoms.   Olivia Hooper DPM

## 2019-04-03 NOTE — Telephone Encounter (Signed)
Received 3 month supply of clofazimine for patient.  Linezolid will also arrive to clinic tomorrow morning.  She is scheduled for tomorrow to come in and see pharmacy for counseling.

## 2019-04-04 ENCOUNTER — Telehealth: Payer: Self-pay | Admitting: *Deleted

## 2019-04-04 ENCOUNTER — Other Ambulatory Visit (HOSPITAL_COMMUNITY): Payer: Self-pay | Admitting: *Deleted

## 2019-04-04 ENCOUNTER — Other Ambulatory Visit: Payer: Self-pay

## 2019-04-04 ENCOUNTER — Other Ambulatory Visit: Payer: Self-pay | Admitting: Radiology

## 2019-04-04 ENCOUNTER — Ambulatory Visit (INDEPENDENT_AMBULATORY_CARE_PROVIDER_SITE_OTHER): Payer: Medicare Other | Admitting: Pharmacist

## 2019-04-04 DIAGNOSIS — J984 Other disorders of lung: Secondary | ICD-10-CM

## 2019-04-04 DIAGNOSIS — A310 Pulmonary mycobacterial infection: Secondary | ICD-10-CM | POA: Diagnosis not present

## 2019-04-04 NOTE — Progress Notes (Signed)
HPI: Olivia Hooper is a 79 y.o. 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   Patient Active Problem List   Diagnosis Date Noted  . 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 Cavitating mass in right upper lung lobe 01/31/2019  . S/P eye surgery 01/25/2015  . 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 Age-related macular degeneration, dry, both eyes 11/30/2014  . Pseudophakia of both eyes 11/30/2014  . Pulmonary nodules 03/18/2014  . Cigarette smoker 03/18/2014  . COPD GOLD II / still smoking  03/15/2014  . Fatigue 02/18/2011  . Encounter for long-term (current) use of other medications 01/08/2011  . Rheumatoid arthritis ([place]) 08/21/2009    Patient's Medications  New Prescriptions   No medications on file  Previous Medications   ACETAMINOPHEN (TYLENOL) 500 MG TABLET    Take 1,000 mg by mouth every 8 (eight) hours as needed.   ALBUTEROL (VENTOLIN HFA) 108 (90 BASE) MCG/ACT INHALER    Inhale 2 puffs into the lungs every 6 (six) hours as needed for wheezing or shortness of breath.    AMBULATORY NON FORMULARY MEDICATION    Take 100 mg by mouth daily. Medication Name: clofazimine for mycobacterium abscessus pulmonary infection   CERTOLIZUMAB PEGOL (CIMZIA [place])    Inject 2 Doses/Fill into the skin every 28 (twenty-eight) days.   DENOSUMAB (PROLIA) 60 MG/ML SOSY INJECTION    Inject 60 mg into the skin every 6 (six) months.   FOLIC ACID (FOLVITE) 1 MG TABLET    Take 3 mg by mouth See admin instructions. Take 3 mg daily for 6 days, skip dose on methotrexate day (Thursday)   LINEZOLID (ZYVOX) 600 MG TABLET    Take 1 tablet (600 mg total) by mouth 2 (two) times daily.   METHOTREXATE 2.5 MG TABLET    Take 15 mg by mouth every Thursday.    MOMETASONE-FORMOTEROL (DULERA) 100-5 MCG/ACT AERO    Inhale 2  puffs into the lungs 2 (two) times daily.   NICOTINE (NICODERM CQ - DOSED IN MG/24 HR) 7 MG/24HR PATCH    Place 1 patch (7 mg total) onto the skin daily.   POLYETHYL GLYCOL-PROPYL GLYCOL (SYSTANE OP)    Place 1 drop into both eyes daily as needed (dry eyes).   PREDNISONE (DELTASONE) 1 MG TABLET    Take 1 mg by mouth 2 (two) times daily with a meal.   SIMVASTATIN (ZOCOR) 40 MG TABLET    Take 40 mg by mouth at bedtime.    TRAMADOL (ULTRAM) 50 MG TABLET    Take 50 mg by mouth 2 (two) times daily as needed for moderate pain.    TRIAMCINOLONE OINTMENT (KENALOG) 0.1 %    Apply 1 application topically 2 (two) times daily as needed (dry skin).   Modified Medications   No medications on file  Discontinued Medications   No medications on file    Allergies: Allergies  Allergen Reactions  . Buprenorphine Hcl Nausea And Vomiting  . Morphine And Related Nausea And Vomiting         Past Medical History: Past Medical History:  Diagnosis Date  . Breast cancer ([place])    on left  . Carpal tunnel syndrome   . COPD (chronic obstructive pulmonary disease) ([place])   . Dyspnea   . Hypercalcemia   . Hyperlipidemia   . Hyperparathyroidism ([place])   .  Osteoporosis   . Pneumonia   . 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 RA (rheumatoid arthritis) ([place])   . Vertigo   . Vitamin D deficiency     Social History: Social History   Socioeconomic History  . Marital status: Widowed    Spouse name: Not on file  . Number of children: Not on file  . Years of education: Not on file  . Highest education level: Not on file  Occupational History  . Occupation: Retired   Tobacco Use  . Smoking status: Former Smoker    Packs/day: 0.25    Years: 62.00    Pack years: 15.50    Types: Cigarettes    Quit date: 01/12/2019    Years since quitting: 0.2  . Smokeless tobacco: Never Used  Substance and Sexual Activity  . Alcohol use: No    Alcohol/week: 0.0 standard drinks  . Drug use: No  . Sexual  activity: Not on file  Other Topics Concern  . Not on file  Social History Narrative  . Not on file   Social Determinants of Health   Financial Resource Strain:   . Difficulty of Paying Living Expenses: Not on file  Food Insecurity:   . Worried About Charity fundraiser in the Last Year: Not on file  . Ran Out of Food in the Last Year: Not on file  Transportation Needs:   . Lack of Transportation (Medical): Not on file  . Lack of Transportation (Non-Medical): Not on file  Physical Activity:   . Days of Exercise per Week: Not on file  . Minutes of Exercise per Session: Not on file  Stress:   . Feeling of Stress : Not on file  Social Connections:   . Frequency of Communication with Friends and Family: Not on file  . Frequency of Social Gatherings with Friends and Family: Not on file  . Attends Religious Services: Not on file  . Active Member of Clubs or Organizations: Not on file  . Attends Archivist Meetings: Not on file  . Marital Status: Not on file    Assessment: Olivia Hooper and her daughter are here at the clinic today to discuss and pick up her linezolid and clofazimine for treatment of her mycobacterium abscessus infection. She has no complaints today and is eager to start her treatment. She will be getting her port placed tomorrow where she will receive a test dose of the cefoxitin following insertion to ensure she tolerates the medication. We will plan to start the continuous infusion and her oral medications on Thursday 1/28.  I counseled her that clofazimine is a 2 tablet once daily medication and that she needs to take both of the tablets at the same time each day. I spoke with her about some of the common side effects that can be experienced with clofazimine including some GI upset (N/V/D) and skin discoloration being the most commonly reported. She may take this medication with some food if it upsets her stomach, but it is typically well tolerated. Side effects are  more common at doses >100mg  so hopefully she will have no issues. I also discussed linezolid and that it is a one pill twice daily medication. The side effects commonly experienced by this medication are identified by lab work (thrombocytopenia, anemia) but she could experience some GI intolerances as well. I reassured her that we will be monitoring her closely in her follow up appointments but if she experiences any abnormal or intolerable side effects  to give [place] a call so we can help her. In regards to her cefoxitin, I explained home health should be in touch with her. This is a continuous infusion that will run all day and she will need to hold this infusion 2.5 hours before she has labs done to prevent any falsely elevated lab values like Scr. The home health orders have instructions to call her before they come so they have the opportunity to do this.  She says she typically experiences constipation with antibiotics and wondered if she should pre-medicate with something before starting. I told her to hold off on this and see how her body responds to the medication before starting any medications for anticipated side effects. I wrote down most of what we talked about so she could refer back to it and also gave her an informational packet on clofazimine. She has [place] work number which I encouraged her to call with any questions or issues she has.    Plan: -Start the three drug regimen on 04/06/19 -Linezolid 600mg  PO BID -Clofazimine 100mg  PO once daily -Cefoxitin 12g IV continuous infusion -Follow up with Dr. Tommy Medal 05/03/2019  Nicoletta Dress, PharmD PGY2 Infectious Disease Pharmacy Resident  [facility] 04/04/2019, 11:55 AM

## 2019-04-04 NOTE — Telephone Encounter (Signed)
Paitent will be set up with [facility]. Per Stanton Kidney, nursing will be there Thursday. Landis Gandy, RN

## 2019-04-05 ENCOUNTER — Encounter (HOSPITAL_COMMUNITY)
Admission: RE | Admit: 2019-04-05 | Discharge: 2019-04-05 | Disposition: A | Discharge status: Home / Self Care | Payer: Medicare Other | Source: Ambulatory Visit | Attending: Infectious Disease | Admitting: Infectious Disease

## 2019-04-05 ENCOUNTER — Ambulatory Visit (HOSPITAL_COMMUNITY)
Admission: RE | Admit: 2019-04-05 | Discharge: 2019-04-05 | Disposition: A | Discharge status: Home / Self Care | Payer: Medicare Other | Source: Ambulatory Visit | Attending: Infectious Disease | Admitting: Infectious Disease

## 2019-04-05 ENCOUNTER — Other Ambulatory Visit (HOSPITAL_COMMUNITY): Payer: Self-pay | Admitting: *Deleted

## 2019-04-05 ENCOUNTER — Other Ambulatory Visit: Payer: Self-pay

## 2019-04-05 DIAGNOSIS — Z853 Personal history of malignant neoplasm of breast: Secondary | ICD-10-CM | POA: Insufficient documentation

## 2019-04-05 DIAGNOSIS — Z79899 Other long term (current) drug therapy: Secondary | ICD-10-CM | POA: Diagnosis not present

## 2019-04-05 DIAGNOSIS — G5600 Carpal tunnel syndrome, unspecified upper limb: Secondary | ICD-10-CM | POA: Diagnosis not present

## 2019-04-05 DIAGNOSIS — E213 Hyperparathyroidism, unspecified: Secondary | ICD-10-CM | POA: Diagnosis not present

## 2019-04-05 DIAGNOSIS — J449 Chronic obstructive pulmonary disease, unspecified: Secondary | ICD-10-CM | POA: Diagnosis not present

## 2019-04-05 DIAGNOSIS — E559 Vitamin D deficiency, unspecified: Secondary | ICD-10-CM | POA: Insufficient documentation

## 2019-04-05 DIAGNOSIS — M810 Age-related osteoporosis without current pathological fracture: Secondary | ICD-10-CM | POA: Diagnosis not present

## 2019-04-05 DIAGNOSIS — Z452 Encounter for adjustment and management of vascular access device: Secondary | ICD-10-CM | POA: Diagnosis not present

## 2019-04-05 DIAGNOSIS — A310 Pulmonary mycobacterial infection: Secondary | ICD-10-CM | POA: Diagnosis not present

## 2019-04-05 DIAGNOSIS — E785 Hyperlipidemia, unspecified: Secondary | ICD-10-CM | POA: Insufficient documentation

## 2019-04-05 DIAGNOSIS — M069 Rheumatoid arthritis, unspecified: Secondary | ICD-10-CM | POA: Diagnosis not present

## 2019-04-05 DIAGNOSIS — Z87891 Personal history of nicotine dependence: Secondary | ICD-10-CM | POA: Diagnosis not present

## 2019-04-05 HISTORY — PX: IR IMAGING GUIDED PORT INSERTION: IMG5740

## 2019-04-05 LAB — CBC
HCT: 33.7 % — ABNORMAL LOW (ref 36.0–46.0)
Hemoglobin: 10 g/dL — ABNORMAL LOW (ref 12.0–15.0)
MCH: 25.3 pg — ABNORMAL LOW (ref 26.0–34.0)
MCHC: 29.7 g/dL — ABNORMAL LOW (ref 30.0–36.0)
MCV: 85.3 fL (ref 80.0–100.0)
Platelets: 445 10*3/uL — ABNORMAL HIGH (ref 150–400)
RBC: 3.95 MIL/uL (ref 3.87–5.11)
RDW: 16.5 % — ABNORMAL HIGH (ref 11.5–15.5)
WBC: 8.3 10*3/uL (ref 4.0–10.5)
nRBC: 0 % (ref 0.0–0.2)

## 2019-04-05 MED ORDER — MIDAZOLAM HCL 2 MG/2ML IJ SOLN
INTRAMUSCULAR | Status: AC
  Filled 2019-04-05: qty 2

## 2019-04-05 MED ORDER — FENTANYL CITRATE (PF) 100 MCG/2ML IJ SOLN
INTRAMUSCULAR | Status: AC | PRN
  Administered 2019-04-05 (×2): 25 ug via INTRAVENOUS

## 2019-04-05 MED ORDER — SODIUM CHLORIDE 0.9 % IV SOLN
INTRAVENOUS | Status: AC | PRN
  Administered 2019-04-05: 10 mL/h via INTRAVENOUS

## 2019-04-05 MED ORDER — HEPARIN SOD (PORK) LOCK FLUSH 100 UNIT/ML IV SOLN
INTRAVENOUS | Status: AC | PRN
  Administered 2019-04-05: 500 [IU] via INTRAVENOUS

## 2019-04-05 MED ORDER — LIDOCAINE-EPINEPHRINE 1 %-1:100000 IJ SOLN
INTRAMUSCULAR | Status: AC | PRN
  Administered 2019-04-05: 10 mL

## 2019-04-05 MED ORDER — HEPARIN SOD (PORK) LOCK FLUSH 100 UNIT/ML IV SOLN
INTRAVENOUS | Status: AC
  Filled 2019-04-05: qty 5

## 2019-04-05 MED ORDER — FENTANYL CITRATE (PF) 100 MCG/2ML IJ SOLN
INTRAMUSCULAR | Status: AC
  Filled 2019-04-05: qty 2

## 2019-04-05 MED ORDER — LIDOCAINE-EPINEPHRINE 1 %-1:100000 IJ SOLN
INTRAMUSCULAR | Status: AC
  Filled 2019-04-05: qty 1

## 2019-04-05 MED ORDER — CEFAZOLIN SODIUM-DEXTROSE 2-4 GM/100ML-% IV SOLN: 2.0000 g | Freq: Once | INTRAVENOUS | Status: DC

## 2019-04-05 MED ORDER — CEFAZOLIN SODIUM-DEXTROSE 2-4 GM/100ML-% IV SOLN
INTRAVENOUS | Status: AC
  Filled 2019-04-05: qty 100

## 2019-04-05 MED ORDER — SODIUM CHLORIDE 0.9 % IV SOLN: INTRAVENOUS | Status: DC

## 2019-04-05 MED ORDER — SODIUM CHLORIDE 0.9 % IV SOLN
2.0000 g | Freq: Once | INTRAVENOUS | Status: DC
  Filled 2019-04-05: qty 2

## 2019-04-05 MED ORDER — SODIUM CHLORIDE 0.9 % IV SOLN
2.0000 g | Freq: Once | INTRAVENOUS | Status: DC
  Administered 2019-04-05: 2 g via INTRAVENOUS

## 2019-04-05 MED ORDER — MIDAZOLAM HCL 2 MG/2ML IJ SOLN
INTRAMUSCULAR | Status: AC | PRN
  Administered 2019-04-05 (×2): 0.5 mg via INTRAVENOUS

## 2019-04-05 NOTE — H&P (Signed)
Chief Complaint: Patient was seen in consultation today for pulmonary mycobacterium  Referring Physician(s): Lucianne Lei Dam,Cornelius N  Supervising Physician: Daryll Brod  Patient Status: [facility]  History of Present Illness: Olivia Hooper is a 79 y.o. female with past medical history of left breast cancer, COPD, HLD, RA on chronic therapy recently found to have pulmonary mycobacterial infection who presents today for Port-A-Cath placement at the request of Dr. Tommy Medal.   Patient presents today in her usual state of health.  She denies new fevers, chills, nausea, shortness of breath, cough, abdominal pain, dysuria.   She has been NPO.  She does not take blood thinners.   She is scheduled for her first infusion today.   Past Medical History:  Diagnosis Date  . Breast cancer ([place])    on left  . Carpal tunnel syndrome   . COPD (chronic obstructive pulmonary disease) ([place])   . Dyspnea   . Hypercalcemia   . Hyperlipidemia   . Hyperparathyroidism ([place])   . Osteoporosis   . Pneumonia   . 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 RA (rheumatoid arthritis) ([place])   . Vertigo   . Vitamin D deficiency     Past Surgical History:  Procedure Laterality Date  . CARPAL TUNNEL RELEASE    . CATARACT EXTRACTION    . MASTECTOMY Left   . VESICOVAGINAL FISTULA CLOSURE W/ TAH    . VIDEO BRONCHOSCOPY WITH ENDOBRONCHIAL ULTRASOUND N/A 01/18/2019   Procedure: VIDEO BRONCHOSCOPY WITH ENDOBRONCHIAL ULTRASOUND;  Surgeon: Candee Furbish, MD;  Location: [facility] OR;  Service: Thoracic;  Laterality: N/A;    Allergies: Buprenorphine hcl and Morphine and related  Medications: Prior to Admission medications   Medication Sig Start Date End Date Taking? Authorizing Provider  acetaminophen (TYLENOL) 500 MG tablet Take 1,000 mg by mouth every 8 (eight) hours as needed.   Yes [provider]  albuterol (VENTOLIN HFA) 108 (90 Base) MCG/ACT inhaler Inhale 2 puffs into the lungs every  6 (six) hours as needed for wheezing or shortness of breath.    Yes [provider]  AMBULATORY NON FORMULARY MEDICATION Take 100 mg by mouth daily. Medication Name: clofazimine for mycobacterium abscessus pulmonary infection 03/27/19  Yes Kuppelweiser, [name] L, RPH-CPP  mometasone-formoterol (DULERA) 100-5 MCG/ACT AERO Inhale 2 puffs into the lungs 2 (two) times daily.   Yes [provider]  Polyethyl Glycol-Propyl Glycol (SYSTANE OP) Place 1 drop into both eyes daily as needed (dry eyes).   Yes [provider]  predniSONE (DELTASONE) 1 MG tablet Take 1 mg by mouth 2 (two) times daily with a meal.   Yes [provider]  traMADol (ULTRAM) 50 MG tablet Take 50 mg by mouth 2 (two) times daily as needed for moderate pain.    Yes [provider]  triamcinolone ointment (KENALOG) 0.1 % Apply 1 application topically 2 (two) times daily as needed (dry skin).    Yes [provider]  Certolizumab Pegol (CIMZIA [place]) Inject 2 Doses/Fill into the skin every 28 (twenty-eight) days.    [provider]  denosumab (PROLIA) 60 MG/ML SOSY injection Inject 60 mg into the skin every 6 (six) months.    [provider]  folic acid (FOLVITE) 1 MG tablet Take 3 mg by mouth See admin instructions. Take 3 mg daily for 6 days, skip dose on methotrexate day (Thursday)    [provider]  linezolid (ZYVOX) 600 MG tablet Take 1 tablet (600 mg total) by mouth 2 (  two) times daily. 03/31/19   Kuppelweiser, [name] L, RPH-CPP  methotrexate 2.5 MG tablet Take 15 mg by mouth every Thursday.  11/27/18   [provider]  nicotine (NICODERM CQ - DOSED IN MG/24 HR) 7 mg/24hr patch Place 1 patch (7 mg total) onto the skin daily. Patient not taking: Reported on 03/14/2019 01/13/19 01/13/20  Candee Furbish, MD  simvastatin (ZOCOR) 40 MG tablet Take 40 mg by mouth at bedtime.  12/01/18   [provider]     Family History  Problem Relation Age of Onset   . Heart disease Father   . Bone cancer Father   . Emphysema Father        never smoker  . Heart disease Mother   . Colon cancer Mother   . Melanoma Sister   . Kidney cancer Brother     Social History   Socioeconomic History  . Marital status: Widowed    Spouse name: Not on file  . Number of children: Not on file  . Years of education: Not on file  . Highest education level: Not on file  Occupational History  . Occupation: Retired   Tobacco Use  . Smoking status: Former Smoker    Packs/day: 0.25    Years: 62.00    Pack years: 15.50    Types: Cigarettes    Quit date: 01/12/2019    Years since quitting: 0.2  . Smokeless tobacco: Never Used  Substance and Sexual Activity  . Alcohol use: No    Alcohol/week: 0.0 standard drinks  . Drug use: No  . Sexual activity: Not on file  Other Topics Concern  . Not on file  Social History Narrative  . Not on file   Social Determinants of Health   Financial Resource Strain:   . Difficulty of Paying Living Expenses: Not on file  Food Insecurity:   . Worried About Charity fundraiser in the Last Year: Not on file  . Ran Out of Food in the Last Year: Not on file  Transportation Needs:   . Lack of Transportation (Medical): Not on file  . Lack of Transportation (Non-Medical): Not on file  Physical Activity:   . Days of Exercise per Week: Not on file  . Minutes of Exercise per Session: Not on file  Stress:   . Feeling of Stress : Not on file  Social Connections:   . Frequency of Communication with Friends and Family: Not on file  . Frequency of Social Gatherings with Friends and Family: Not on file  . Attends Religious Services: Not on file  . Active Member of Clubs or Organizations: Not on file  . Attends Archivist Meetings: Not on file  . Marital Status: Not on file     Review of Systems: A 12 point ROS discussed and pertinent positives are indicated in the HPI above.  All other systems are negative.  Review of  Systems  Constitutional: Negative for fatigue and fever.  Respiratory: Negative for cough and shortness of breath.   Cardiovascular: Negative for chest pain.  Gastrointestinal: Negative for abdominal pain, constipation, diarrhea, nausea and vomiting.  Genitourinary: Negative for dysuria.  Psychiatric/Behavioral: Negative for behavioral problems and confusion.    Vital Signs: BP (!) 148/78   Pulse 73   Temp 98 F (36.7 C) (Oral)   Resp 18   Ht 5' 1.5" (1.562 m)   Wt 95 lb (43.1 kg)   SpO2 99%   BMI 17.66 kg/m  Physical Exam Vitals and nursing note reviewed.  Constitutional:      General: She is not in acute distress.    Appearance: Normal appearance. She is not ill-appearing.  HENT:     Mouth/Throat:     Mouth: Mucous membranes are moist.     Pharynx: Oropharynx is clear.  Cardiovascular:     Rate and Rhythm: Normal rate and regular rhythm.  Pulmonary:     Effort: Pulmonary effort is normal. No respiratory distress.     Breath sounds: Normal breath sounds.  Abdominal:     General: Abdomen is flat.     Palpations: Abdomen is soft.  Skin:    General: Skin is warm and dry.  Neurological:     General: No focal deficit present.     Mental Status: She is alert and oriented to person, place, and time. Mental status is at baseline.  Psychiatric:        Mood and Affect: Mood normal.        Behavior: Behavior normal.        Thought Content: Thought content normal.        Judgment: Judgment normal.      MD Evaluation Airway: WNL Heart: WNL Abdomen: WNL Chest/ Lungs: WNL ASA  Classification: 3 Mallampati/Airway Score: One   Imaging: No results found.  Labs:  CBC: Recent Labs    01/06/19 0920 01/11/19 1314 04/05/19 0818  WBC 7.6 7.9 8.3  HGB 10.1* 10.0* 10.0*  HCT 33.7* 32.9* 33.7*  PLT 330 317 445*    COAGS: Recent Labs    01/18/19 1100  INR 1.0  APTT 41*    BMP: Recent Labs    01/06/19 0920 01/11/19 1314  NA 138 138  K 3.9 4.1  CL 101  101  CO2 28 27  GLUCOSE 88 80  BUN 10 9  CALCIUM 10.5* 10.2  CREATININE 0.72 0.72  GFRNONAA >60 >60  GFRAA >60 >60    LIVER FUNCTION TESTS: Recent Labs    01/11/19 1314  BILITOT 0.3  AST 15  ALT <6  ALKPHOS 59  PROT 6.8  ALBUMIN 2.9*    TUMOR MARKERS: No results for input(s): AFPTM, CEA, CA199, CHROMGRNA in the last 8760 hours.  Assessment and Plan: Patient with past medical history of rheumatoid arthritis presents with complaint of pulmonary mycobacterium.  IR consulted for Port-A-Cath placement at the request of Dr. Tommy Medal. Case reviewed by Dr. Annamaria Boots who approves patient for procedure.  Patient presents today in their usual state of health.  She has been NPO and is not currently on blood thinners.  She is scheduled for infusion after placement today in [facility].  Port to remain accessed at discharge for infusion tomorrow per [place] [facility].   Risks and benefits of image guided port-a-catheter placement was discussed with the patient including, but not limited to bleeding, infection, pneumothorax, or fibrin sheath development and need for additional procedures.  All of the patient's questions were answered, patient is agreeable to proceed. Consent signed and in chart.  Thank you for this interesting consult.  I greatly enjoyed meeting Stevee Mani and look forward to participating in their care.  A copy of this report was sent to the requesting provider on this date.  Electronically Signed: Docia Barrier, PA 04/05/2019, 9:46 AM   I spent a total of  30 Minutes   in face to face in clinical consultation, greater than 50% of which was counseling/coordinating care for pulmonary mycobacterium.

## 2019-04-05 NOTE — Procedures (Signed)
RA, pulm TB, access for long term abx  S/p RT IJ POWER PORT  TIP SVCRA NO COMP STABLE EBL MIN READY FOR USE FULL REPORT IN PACS

## 2019-04-05 NOTE — Telephone Encounter (Signed)
Debbie from [facility] called office today requesting home health orders. Patient will meet be meeting home health nurse tomorrow. Informed Debbie orders were faxed by RN on 1/11. Jackelyn Poling was able to confirm they received orders. Will call office if she requires further assistance. [place]

## 2019-04-05 NOTE — Discharge Instructions (Addendum)
Moderate Conscious Sedation, Adult Sedation is the use of medicines to promote relaxation and relieve discomfort and anxiety. Moderate conscious sedation is a type of sedation. Under moderate conscious sedation, you are less alert than normal, but you are still able to respond to instructions, touch, or both. Moderate conscious sedation is used during short medical and dental procedures. It is milder than deep sedation, which is a type of sedation under which you cannot be easily woken up. It is also milder than general anesthesia, which is the use of medicines to make you unconscious. Moderate conscious sedation allows you to return to your regular activities sooner. Tell a health care provider about:  Any allergies you have.  All medicines you are taking, including vitamins, herbs, eye drops, creams, and over-the-counter medicines.  Use of steroids (by mouth or creams).  Any problems you or family members have had with sedatives and anesthetic medicines.  Any blood disorders you have.  Any surgeries you have had.  Any medical conditions you have, such as sleep apnea.  Whether you are pregnant or may be pregnant.  Any use of cigarettes, alcohol, marijuana, or street drugs. What are the risks? Generally, this is a safe procedure. However, problems may occur, including:  Getting too much medicine (oversedation).  Nausea.  Allergic reaction to medicines.  Trouble breathing. If this happens, a breathing tube may be used to help with breathing. It will be removed when you are awake and breathing on your own.  Heart trouble.  Lung trouble. What happens before the procedure? Staying hydrated Follow instructions from your health care provider about hydration, which may include:  Up to 2 hours before the procedure - you may continue to drink clear liquids, such as water, clear fruit juice, black coffee, and plain tea. Eating and drinking restrictions Follow instructions from  your health care provider about eating and drinking, which may include:  8 hours before the procedure - stop eating heavy meals or foods such as meat, fried foods, or fatty foods.  6 hours before the procedure - stop eating light meals or foods, such as toast or cereal.  6 hours before the procedure - stop drinking milk or drinks that contain milk.  2 hours before the procedure - stop drinking clear liquids. Medicine Ask your health care provider about:  Changing or stopping your regular medicines. This is especially important if you are taking diabetes medicines or blood thinners.  Taking medicines such as aspirin and ibuprofen. These medicines can thin your blood. Do not take these medicines before your procedure if your health care provider instructs you not to.  Tests and exams  You will have a physical exam.  You may have blood tests done to show: ? How well your kidneys and liver are working. ? How well your blood can clot. General instructions  Plan to have someone take you home from the hospital or clinic.  If you will be going home right after the procedure, plan to have someone with you for 24 hours. What happens during the procedure?  An IV tube will be inserted into one of your veins.  Medicine to help you relax (sedative) will be given through the IV tube.  The medical or dental procedure will be performed. What happens after the procedure?  Your blood pressure, heart rate, breathing rate, and blood oxygen level will be monitored often until the medicines you were given have worn off.  Do not drive for 24 hours. This information is  not intended to replace advice given to you by your health care provider. Make sure you discuss any questions you have with your health care provider. Document Revised: 02/05/2017 Document Reviewed: 06/15/2015 Elsevier Patient Education  [place] Insertion, Care After This sheet gives you information  about how to care for yourself after your procedure. Your health care provider may also give you more specific instructions. If you have problems or questions, contact your health care provider. What can I expect after the procedure? After the procedure, it is common to have:  Discomfort at the port insertion site.  Bruising on the skin over the port. This should improve over 3-4 days. Follow these instructions at home: [facility] care  After your port is placed, you will get a manufacturer's information card. The card has information about your port. Keep this card with you at all times.  Take care of the port as told by your health care provider. Ask your health care provider if you or a family member can get training for taking care of the port at home. A home health care nurse may also take care of the port.  Make sure to remember what type of port you have. Incision care      Follow instructions from your health care provider about how to take care of your port insertion site. Make sure you: ? Wash your hands with soap and water before and after you change your bandage (dressing). If soap and water are not available, use hand sanitizer. ? Change your dressing as told by your health care provider. ? Leave stitches (sutures), skin glue, or adhesive strips in place. These skin closures may need to stay in place for 2 weeks or longer. If adhesive strip edges start to loosen and curl up, you may trim the loose edges. Do not remove adhesive strips completely unless your health care provider tells you to do that.  Check your port insertion site every day for signs of infection. Check for: ? Redness, swelling, or pain. ? Fluid or blood. ? Warmth. ? Pus or a bad smell. Activity  Return to your normal activities as told by your health care provider. Ask your health care provider what activities are safe for you.  Do not lift anything that is heavier than 10 lb (4.5 kg), or the limit that you  are told, until your health care provider says that it is safe. General instructions  Take over-the-counter and prescription medicines only as told by your health care provider.  Do not take baths, swim, or use a hot tub until your health care provider approves. Ask your health care provider if you may take showers. You may only be allowed to take sponge baths.  Do not drive for 24 hours if you were given a sedative during your procedure.  Wear a medical alert bracelet in case of an emergency. This will tell any health care providers that you have a port.  Keep all follow-up visits as told by your health care provider. This is important. Contact a health care provider if:  You cannot flush your port with saline as directed, or you cannot draw blood from the port.  You have a fever or chills.  You have redness, swelling, or pain around your port insertion site.  You have fluid or blood coming from your port insertion site.  Your port insertion site feels warm to the touch.  You have pus or a bad smell coming from  the port insertion site. Get help right away if:  You have chest pain or shortness of breath.  You have bleeding from your port that you cannot control. Summary  Take care of the port as told by your health care provider. Keep the manufacturer's information card with you at all times.  Change your dressing as told by your health care provider.  Contact a health care provider if you have a fever or chills or if you have redness, swelling, or pain around your port insertion site.  Keep all follow-up visits as told by your health care provider. This information is not intended to replace advice given to you by your health care provider. Make sure you discuss any questions you have with your health care provider. Document Revised: 09/21/2017 Document Reviewed: 09/21/2017 Elsevier Patient Education  [place].

## 2019-04-06 DIAGNOSIS — E785 Hyperlipidemia, unspecified: Secondary | ICD-10-CM | POA: Diagnosis not present

## 2019-04-06 DIAGNOSIS — F17210 Nicotine dependence, cigarettes, uncomplicated: Secondary | ICD-10-CM | POA: Diagnosis not present

## 2019-04-06 DIAGNOSIS — Z452 Encounter for adjustment and management of vascular access device: Secondary | ICD-10-CM | POA: Diagnosis not present

## 2019-04-06 DIAGNOSIS — Z1624 Resistance to multiple antibiotics: Secondary | ICD-10-CM | POA: Diagnosis not present

## 2019-04-06 DIAGNOSIS — Z9012 Acquired absence of left breast and nipple: Secondary | ICD-10-CM | POA: Diagnosis not present

## 2019-04-06 DIAGNOSIS — A310 Pulmonary mycobacterial infection: Secondary | ICD-10-CM | POA: Diagnosis not present

## 2019-04-06 DIAGNOSIS — M069 Rheumatoid arthritis, unspecified: Secondary | ICD-10-CM | POA: Diagnosis not present

## 2019-04-06 DIAGNOSIS — Z5181 Encounter for therapeutic drug level monitoring: Secondary | ICD-10-CM | POA: Diagnosis not present

## 2019-04-06 DIAGNOSIS — E559 Vitamin D deficiency, unspecified: Secondary | ICD-10-CM | POA: Diagnosis not present

## 2019-04-06 DIAGNOSIS — M810 Age-related osteoporosis without current pathological fracture: Secondary | ICD-10-CM | POA: Diagnosis not present

## 2019-04-06 DIAGNOSIS — Z792 Long term (current) use of antibiotics: Secondary | ICD-10-CM | POA: Diagnosis not present

## 2019-04-06 DIAGNOSIS — Z853 Personal history of malignant neoplasm of breast: Secondary | ICD-10-CM | POA: Diagnosis not present

## 2019-04-06 DIAGNOSIS — E213 Hyperparathyroidism, unspecified: Secondary | ICD-10-CM | POA: Diagnosis not present

## 2019-04-06 DIAGNOSIS — J449 Chronic obstructive pulmonary disease, unspecified: Secondary | ICD-10-CM | POA: Diagnosis not present

## 2019-04-10 ENCOUNTER — Ambulatory Visit
Admission: RE | Admit: 2019-04-10 | Discharge: 2019-04-10 | Disposition: A | Discharge status: Home / Self Care | Payer: Medicare Other | Source: Ambulatory Visit | Attending: Thoracic Surgery (Cardiothoracic Vascular Surgery) | Admitting: Thoracic Surgery (Cardiothoracic Vascular Surgery)

## 2019-04-10 ENCOUNTER — Other Ambulatory Visit: Payer: Self-pay

## 2019-04-10 DIAGNOSIS — R918 Other nonspecific abnormal finding of lung field: Secondary | ICD-10-CM | POA: Diagnosis not present

## 2019-04-11 ENCOUNTER — Other Ambulatory Visit: Payer: Self-pay | Admitting: Thoracic Surgery (Cardiothoracic Vascular Surgery)

## 2019-04-11 ENCOUNTER — Ambulatory Visit (INDEPENDENT_AMBULATORY_CARE_PROVIDER_SITE_OTHER): Payer: Medicare Other | Admitting: Thoracic Surgery (Cardiothoracic Vascular Surgery)

## 2019-04-11 ENCOUNTER — Other Ambulatory Visit: Payer: Self-pay

## 2019-04-11 VITALS — BP 128/62 | HR 88 | Temp 98.1°F | Resp 20 | Ht 61.5 in | Wt 95.0 lb

## 2019-04-11 DIAGNOSIS — J9859 Other diseases of mediastinum, not elsewhere classified: Secondary | ICD-10-CM | POA: Diagnosis not present

## 2019-04-11 MED ORDER — LIDOCAINE-PRILOCAINE 2.5-2.5 % EX CREA: 1.0000 "application " | TOPICAL_CREAM | CUTANEOUS | 0 refills | Status: DC | PRN

## 2019-04-11 NOTE — Progress Notes (Signed)
[address]       [place] [address]             [phone]     HPI: Olivia Hooper returns for scheduled follow-up  Olivia Hooper is a 79 year old Hooper with a history of tobacco abuse, COPD, rheumatoid arthritis, left breast cancer, hyperparathyroidism, hyperlipidemia, osteoporosis, and vertigo.  She presented with hemoptysis and was found to have a large cavitary mass in the right upper lobe.  She had multiple smaller pulmonary nodules.  There also was an anterior mediastinal soft tissue mass.  She had bronchoscopy by Dr. Erskine Emery which revealed mycobacterial infection.  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  She was seen by Dr. Tommy Medal.  He has started her on IV antibiotics.  She is a started those last week.  She has not had any new respiratory issues since her last office visit.  Her primary complaint is diarrhea.  Past Medical History:  Diagnosis Date  . Breast cancer ([place])    on left  . Carpal tunnel syndrome   . COPD (chronic obstructive pulmonary disease) ([place])   . Dyspnea   . Hypercalcemia   . Hyperlipidemia   . Hyperparathyroidism ([place])   . Osteoporosis   . Pneumonia   . Pulmonary mycobacterial infection ([place]) 03/14/2019  . RA (rheumatoid arthritis) ([place])   . Vertigo   . Vitamin D deficiency     Current Outpatient Medications  Medication Sig Dispense Refill  . acetaminophen (TYLENOL) 500 MG tablet Take 1,000 mg by mouth every 8 (eight) hours as needed.    Marland Kitchen albuterol (VENTOLIN HFA) 108 (90 Base) MCG/ACT inhaler Inhale 2 puffs into the lungs every 6 (six) hours as needed for wheezing or shortness of breath.     . AMBULATORY NON FORMULARY MEDICATION Take 100 mg by mouth daily. Medication Name: clofazimine for mycobacterium abscessus pulmonary infection 100 capsule 2  . Certolizumab Pegol (CIMZIA [place]) Inject 2 Doses/Fill into the skin every 28 (twenty-eight) days.    Marland Kitchen linezolid (ZYVOX) 600 MG tablet Take 1 tablet (600 mg total) by mouth 2 (two) times daily.  60 tablet 11  . mometasone-formoterol (DULERA) 100-5 MCG/ACT AERO Inhale 2 puffs into the lungs 2 (two) times daily.    Olivia Hooper Glycol-Propyl Glycol (SYSTANE OP) Place 1 drop into both eyes daily as needed (dry eyes).    . predniSONE (DELTASONE) 1 MG tablet Take 1 mg by mouth 2 (two) times daily with a meal.    . traMADol (ULTRAM) 50 MG tablet Take 50 mg by mouth 2 (two) times daily as needed for moderate pain.     Marland Kitchen triamcinolone ointment (KENALOG) 0.1 % Apply 1 application topically 2 (two) times daily as needed (dry skin).     Marland Kitchen lidocaine-prilocaine (EMLA) cream Apply 1 application topically as needed. 30 g 0  . simvastatin (ZOCOR) 40 MG tablet Take 40 mg by mouth at bedtime.      No current facility-administered medications for this visit.    Physical Exam BP 128/62   Pulse 88   Temp 98.1 F (36.7 C) (Skin)   Resp 20   Ht 5' 1.5" (1.562 m)   Wt 95 lb (43.1 kg)   SpO2 94% Comment: RA  BMI 17.73 kg/m  Olivia Hooper in no acute distress Alert and oriented x3 with no motor deficit Lungs faint wheezing right apex otherwise clear Cardiac regular rate and rhythm Extremities-arthritic changes in hands  Diagnostic Tests: CT CHEST  WITHOUT CONTRAST  TECHNIQUE: Multidetector CT imaging of the chest was performed following the standard protocol without IV contrast.  COMPARISON:  PET-CT 01/23/2019, CT chest 01/06/2019  FINDINGS: Cardiovascular: Heart size unchanged without cardiomegaly. No pericardial fluid/thickening.  Calcifications of the left anterior descending, circumflex coronary arteries.  No significant aortic valve calcifications. Normal course caliber and contour of the thoracic aorta. Atherosclerotic changes of the aortic arch and descending thoracic aorta with no aneurysm.  The anterior mediastinal mass previously demonstrated on contrast-enhanced CT and on PET CT is again inseparable from the proximal branch vessels left common carotid artery in  the left subclavian artery.  Right IJ port catheter in position.  Unremarkable diameter of the main pulmonary artery.  Mediastinum/Nodes: Redemonstration anterior mediastinal mass/soft tissue in the upper left anterior mediastinum. The tissue extends as far caudal as the ascending aorta just proximal to the branch vessels the tissues inseparable from the proximal left common carotid artery origin and left subclavian artery origin. The anterior tissue extends to the substernal fat plane, and is inseparable from the bony cortex. Overall the size of the soft tissue mass has not changed. No erosive bony changes or destructive changes of the sternum since the comparison CT and PET-CT.  Lymph nodes throughout the mediastinum are unchanged. The lowest right paratracheal node measures 11 mm, essentially unchanged. Subcarinal node is unchanged.  Unremarkable appearance of the thoracic esophagus.  Lungs/Pleura: Thick-walled cavitary mass of the right lung apex is again demonstrated with decreased volume of the dependent fluid. Greatest diameter measured on the current 5.2 cm on the axial images 3.9 cm on the coronal reformatted images. As was previously noted the lesion crosses the fissure on the right involving both the right upper lobe and the right lower lobe.  Similar appearance of centrilobular and tree-in-bud nodularity of the right lower lobe, left upper lobe, right middle lobe.  New nodule of the anterior right middle lobe measures 7 mm on image 90 of series 8.  Increasing size of irregular nodules of the right lower lobe when compared to the CT dated 01/06/2019 and PET CT 01/23/2019. Largest of the right lower lobe on image 102 of series 8 measures 14 mm x 12 mm.  Developing cavitary lesion of the superior segment of the left lower lobe on image 43 of series 8, where there has been development of thick-walled cavitary lesion that now measures 15 mm.  There are  additional cavitary lesions which persist within lungs including the right costophrenic sulcus, anterior right upper lobe, posterior left upper lobe.  Upper Abdomen: Punctate calcifications within the splenic and liver parenchyma, unchanged.  No acute finding.  Aortic calcifications.  Musculoskeletal: Surgical changes of left breast reconstruction/implant.  No acute displaced fracture. Similar configuration of the thoracic spine with kyphotic deformity and midthoracic compression fractures at contiguous levels. No bony canal narrowing.  IMPRESSION: Interval chest CT again demonstrates a thick-walled, cavitary, trans-spatial lesion of the right upper lung, compatible with the pathologic diagnosis of mycobacterium infection. Overall the size of this primary lesion is unchanged, with decreased internal fluid level when compared to prior cross-sectional imaging.  Unchanged size of the anterior mediastinal soft tissue mass when compared to prior PET-CT and contrast-enhanced CT. 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  In addition to the primary cavitary lesion of the right lung, there is redemonstration of a pattern of centrilobular/tree-in-bud nodules of the bilateral lungs, as well  as increasing size of more confluent nodules within multiple lobes, some of which are cavitary, suggesting progression of multifocal infection.  Interval placement of right IJ port.  Aortic Atherosclerosis (ICD10-I70.0).   Electronically Signed   By: Corrie Mckusick D.O.   On: 04/10/2019 12:20 I personally reviewed the CT images and concur with the findings noted above  Impression: Olivia Hooper is a 79 year old Hooper with a history of tobacco abuse, COPD, rheumatoid arthritis, left breast cancer, hyperparathyroidism, hyperlipidemia, osteoporosis, and  vertigo.  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  She has some other smaller lung nodules and also has some anterior mediastinal soft tissue density concerning for adenopathy versus thymoma.  Cavitary lung mass/lung nodules-the large cavitary mass is essentially unchanged.  There has been some increase in size of some of the smaller nodules suggestive of multifocal infection per radiology.  This is not surprising given that she just started her antibiotics last week.  Hopefully we will see some improvement on her next scan.  Anterior mediastinal soft tissue mass-I suspect this is all adenopathy, however, I cannot rule out the possibility of a thymoma or lymphoma.  That area needs continued follow-up.  There was no interval increase in the size so I do not think she needs surgery at this point but at some point it may be necessary.  Hopefully if this is adenopathy due to MAC it will improve with antibiotic treatment.  Plan: I placed a prescription for EMLA cream to be used prior to port access I will see her back in 8 weeks with a repeat CT of the chest  Melrose Nakayama, MD [facility] [phone]

## 2019-04-12 DIAGNOSIS — J449 Chronic obstructive pulmonary disease, unspecified: Secondary | ICD-10-CM | POA: Diagnosis not present

## 2019-04-12 DIAGNOSIS — E785 Hyperlipidemia, unspecified: Secondary | ICD-10-CM | POA: Diagnosis not present

## 2019-04-12 DIAGNOSIS — M069 Rheumatoid arthritis, unspecified: Secondary | ICD-10-CM | POA: Diagnosis not present

## 2019-04-12 DIAGNOSIS — Z1624 Resistance to multiple antibiotics: Secondary | ICD-10-CM | POA: Diagnosis not present

## 2019-04-12 DIAGNOSIS — A319 Mycobacterial infection, unspecified: Secondary | ICD-10-CM | POA: Diagnosis not present

## 2019-04-12 DIAGNOSIS — A310 Pulmonary mycobacterial infection: Secondary | ICD-10-CM | POA: Diagnosis not present

## 2019-04-14 ENCOUNTER — Other Ambulatory Visit: Payer: Self-pay

## 2019-04-14 ENCOUNTER — Telehealth: Payer: Self-pay

## 2019-04-14 DIAGNOSIS — B370 Candidal stomatitis: Secondary | ICD-10-CM

## 2019-04-14 MED ORDER — FLUCONAZOLE 100 MG PO TABS: 100.0000 mg | ORAL_TABLET | Freq: Every day | ORAL | 0 refills | Status: DC

## 2019-04-14 NOTE — Telephone Encounter (Signed)
Received call from patient stating she has "thrush". Reports that within the last day or so that her gums, tongue and her throat have become increasingly more painful. Reports that it's extremely painful to swallow. She did not see any white patches, nor did she notice any bleeding. Informed her that RN would pass along concerns to Dr. Tommy Medal and call back with recommendations.   Darshawn Boateng Lorita Officer, RN

## 2019-04-14 NOTE — Telephone Encounter (Signed)
100 mg fluconazole x14 days sent to pharmacy for patient. Patient notified and educated on administration. Will call back with any questions or concerns.   Jaden Abreu Lorita Officer, RN

## 2019-04-14 NOTE — Telephone Encounter (Signed)
We can give her 100 mg of fluconazole daily for 2 weeks

## 2019-04-19 ENCOUNTER — Encounter: Payer: Self-pay | Admitting: Infectious Disease

## 2019-04-19 DIAGNOSIS — E785 Hyperlipidemia, unspecified: Secondary | ICD-10-CM | POA: Diagnosis not present

## 2019-04-19 DIAGNOSIS — M069 Rheumatoid arthritis, unspecified: Secondary | ICD-10-CM | POA: Diagnosis not present

## 2019-04-19 DIAGNOSIS — A310 Pulmonary mycobacterial infection: Secondary | ICD-10-CM | POA: Diagnosis not present

## 2019-04-19 DIAGNOSIS — Z792 Long term (current) use of antibiotics: Secondary | ICD-10-CM | POA: Diagnosis not present

## 2019-04-19 DIAGNOSIS — Z5181 Encounter for therapeutic drug level monitoring: Secondary | ICD-10-CM | POA: Diagnosis not present

## 2019-04-19 DIAGNOSIS — J449 Chronic obstructive pulmonary disease, unspecified: Secondary | ICD-10-CM | POA: Diagnosis not present

## 2019-04-19 DIAGNOSIS — Z1624 Resistance to multiple antibiotics: Secondary | ICD-10-CM | POA: Diagnosis not present

## 2019-04-26 DIAGNOSIS — A310 Pulmonary mycobacterial infection: Secondary | ICD-10-CM | POA: Diagnosis not present

## 2019-04-26 DIAGNOSIS — Z1624 Resistance to multiple antibiotics: Secondary | ICD-10-CM | POA: Diagnosis not present

## 2019-04-26 DIAGNOSIS — E785 Hyperlipidemia, unspecified: Secondary | ICD-10-CM | POA: Diagnosis not present

## 2019-04-26 DIAGNOSIS — J449 Chronic obstructive pulmonary disease, unspecified: Secondary | ICD-10-CM | POA: Diagnosis not present

## 2019-04-26 DIAGNOSIS — M069 Rheumatoid arthritis, unspecified: Secondary | ICD-10-CM | POA: Diagnosis not present

## 2019-05-01 ENCOUNTER — Other Ambulatory Visit: Payer: Self-pay

## 2019-05-01 ENCOUNTER — Ambulatory Visit (INDEPENDENT_AMBULATORY_CARE_PROVIDER_SITE_OTHER): Payer: Medicare Other | Admitting: Podiatry

## 2019-05-01 ENCOUNTER — Encounter: Payer: Self-pay | Admitting: Podiatry

## 2019-05-01 VITALS — Temp 96.8°F

## 2019-05-01 DIAGNOSIS — B351 Tinea unguium: Secondary | ICD-10-CM

## 2019-05-01 DIAGNOSIS — M79675 Pain in left toe(s): Secondary | ICD-10-CM

## 2019-05-01 DIAGNOSIS — M79674 Pain in right toe(s): Secondary | ICD-10-CM

## 2019-05-01 DIAGNOSIS — L97501 Non-pressure chronic ulcer of other part of unspecified foot limited to breakdown of skin: Secondary | ICD-10-CM | POA: Diagnosis not present

## 2019-05-01 MED ORDER — MUPIROCIN 2 % EX OINT: 1.0000 "application " | TOPICAL_OINTMENT | Freq: Two times a day (BID) | CUTANEOUS | 2 refills | Status: DC

## 2019-05-01 NOTE — Progress Notes (Signed)
Subjective: 79 year old female presents the office today for follow-up evaluation of blisters to both of her big toes.  She states that they are sore at times.  She denies any redness or drainage or any swelling.  She is also asking for her nails be trimmed today's are thickened elongated she cannot do them herself. Denies any systemic complaints such as fevers, chills, nausea, vomiting. No acute changes since last appointment, and no other complaints at this time.   Objective: AAO x3, NAD DP/PT pulses palpable bilaterally, CRT less than 3 seconds Hyperkeratotic lesions bilateral hallux.  Upon debridement the left hallux there is a small superficial granular wound present without any probing to bone, undermining or tunneling.  There is no edema, erythema.  There is no fluctuation or crepitation.  There is no malodor. Nails are hypertrophic, dystrophic, brittle, discolored, elongated 10. No surrounding redness or drainage. Tenderness nails 1-5 bilaterally. No open lesions or pre-ulcerative lesions are identified today. No open lesions or pre-ulcerative lesions.  No pain with calf compression, swelling, warmth, erythema  Assessment: Symptomatic onychomycosis, hallux ulceration  Plan: -All treatment options discussed with the patient including all alternatives, risks, complications.  -Debrided the hyperkeratotic lesions to reveal a superficial wound on the left hallux.  Prescribed Pearson ointment to apply daily.  Offloading.  Monitor closely for any signs or symptoms of infection. -Debride the nails x2 without any complications or bleeding. -Patient encouraged to call the office with any questions, concerns, change in symptoms.   *x-ray foot next appointment   Zoua Slade DPM

## 2019-05-03 ENCOUNTER — Other Ambulatory Visit: Payer: Self-pay

## 2019-05-03 ENCOUNTER — Telehealth: Payer: Self-pay

## 2019-05-03 ENCOUNTER — Ambulatory Visit (INDEPENDENT_AMBULATORY_CARE_PROVIDER_SITE_OTHER): Payer: Medicare Other | Admitting: Infectious Disease

## 2019-05-03 ENCOUNTER — Encounter: Payer: Self-pay | Admitting: Infectious Disease

## 2019-05-03 VITALS — BP 132/77 | HR 96 | Temp 97.5°F | Ht 61.5 in | Wt 104.0 lb

## 2019-05-03 DIAGNOSIS — M05731 Rheumatoid arthritis with rheumatoid factor of right wrist without organ or systems involvement: Secondary | ICD-10-CM

## 2019-05-03 DIAGNOSIS — A310 Pulmonary mycobacterial infection: Secondary | ICD-10-CM | POA: Diagnosis not present

## 2019-05-03 DIAGNOSIS — M05732 Rheumatoid arthritis with rheumatoid factor of left wrist without organ or systems involvement: Secondary | ICD-10-CM

## 2019-05-03 DIAGNOSIS — J449 Chronic obstructive pulmonary disease, unspecified: Secondary | ICD-10-CM | POA: Diagnosis not present

## 2019-05-03 DIAGNOSIS — R5383 Other fatigue: Secondary | ICD-10-CM

## 2019-05-03 DIAGNOSIS — D649 Anemia, unspecified: Secondary | ICD-10-CM | POA: Diagnosis not present

## 2019-05-03 DIAGNOSIS — J984 Other disorders of lung: Secondary | ICD-10-CM

## 2019-05-03 DIAGNOSIS — G4710 Hypersomnia, unspecified: Secondary | ICD-10-CM | POA: Diagnosis not present

## 2019-05-03 DIAGNOSIS — E785 Hyperlipidemia, unspecified: Secondary | ICD-10-CM | POA: Diagnosis not present

## 2019-05-03 DIAGNOSIS — Z1624 Resistance to multiple antibiotics: Secondary | ICD-10-CM | POA: Diagnosis not present

## 2019-05-03 DIAGNOSIS — M069 Rheumatoid arthritis, unspecified: Secondary | ICD-10-CM | POA: Diagnosis not present

## 2019-05-03 DIAGNOSIS — D6489 Other specified anemias: Secondary | ICD-10-CM

## 2019-05-03 HISTORY — DX: Hypersomnia, unspecified: G47.10

## 2019-05-03 HISTORY — DX: Other specified anemias: D64.89

## 2019-05-03 HISTORY — DX: Anemia, unspecified: D64.9

## 2019-05-03 MED ORDER — LINEZOLID 600 MG PO TABS: 600.0000 mg | ORAL_TABLET | Freq: Every day | ORAL | 11 refills | Status: DC

## 2019-05-03 MED ORDER — LIDOCAINE-PRILOCAINE 2.5-2.5 % EX CREA: 1.0000 "application " | TOPICAL_CREAM | CUTANEOUS | 0 refills | Status: DC | PRN

## 2019-05-03 MED FILL — LIDOCAINE-PRILOCAINE CREAM: 2.5-2.5 | 20 days supply | Qty: 30 | Fill #0

## 2019-05-03 NOTE — Telephone Encounter (Signed)
Received call from insurance with updated status. Medication approved through 05/03/19-05/02/20. [facility] made aware.  Olivia Hooper

## 2019-05-03 NOTE — Patient Instructions (Signed)
Decrease linezolid to one pill once a day  I will try to set up blood transfusion  Continue clofazimine once a day  Continue cefoxitin

## 2019-05-03 NOTE — Telephone Encounter (Signed)
PA submitted via covermymeds. [facility]  Your information has been submitted to [place]. [employer] [place] will review the request and notify you of the determination decision directly, typically within 3 business days of your submission and once all necessary information is received.  You will also receive your request decision electronically. To check for an update later, open the request again from your dashboard.  If [employer] has not responded within the specified timeframe or if you have any questions about your PA submission, contact Cement Kleberg directly at [facility]) [phone] or ([place]) [phone].

## 2019-05-03 NOTE — Progress Notes (Signed)
Chief complaint: Profound fatigue after starting antibiotics   Subjective:    Patient ID: Olivia Hooper, female    DOB: 1940/04/12, 79 y.o.   MRN: [id number]  HPI  79 year old Caucasian lady with history of tobacco use chronic obstructive pulmonary disease severe rheumatoid arthritis, left breast cancer who has been on immunosuppressive therapy.  She apparently had a 1 month history of hemoptysis.  X-ray had shown a right upper lobe lung mass CT had shown 6.5 x 5.2 x 7.47 manner cavitary mass in the right upper lobe.  There are multiple small lung nodules as well.  There is also a small anterior mediastinal mass.  Patient underwent bronchoscopy with Dr. Tamala Julian with cytology is AFB fungal stains and cultures.  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  In the interval she had a PET CT scan that showed the cavitary mass which was very hypermetabolic patchy tree-in-bud opacities in both lungs there were hypermetabolic and a 3.9 1.6 cm anterior mediastinal soft tissue mass that was hypermetabolic.  There was anxiety about patient potentially having a thymoma but at present she was felt to be a high risk surgical patient when seen by Dr. Salomon Fick with cardiothoracic surgery.  I also agree with him that this mediastinal mass could be due to the mycobacterial abscessus infection.  These organisms are not notoriously difficult to treat still to have some anxiety the patient might need some surgical intervention to gain control of her chest pathology.  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  She had lost 10 pounds of weight over the last half year.  She has stopped smoking now.  We crafted a regimen to treat her Mycobacterium abscessus with once daily clofazimine twice daily Zyvox and IV cefoxitin via continuous infusion.  She is now roughly 5  weeks into treatment.  She has been feeling progressively more fatigued and sleeping excessively according to both her and her son who accompanied her.  In reviewing her labs I can see that her hemoglobin dropped from a value of 10 when she was here before to 8-7.4.  I am concerned that the Zyvox may be causing some myelosuppression of the red cells alone.  Her white cells and platelets were normal.  Without cough fevers.  He is off immunosuppressive therapy for RA  Past Medical History:  Diagnosis Date  . Breast cancer ([place])    on left  . Carpal tunnel syndrome   . COPD (chronic obstructive pulmonary disease) ([place])   . Dyspnea   . Hypercalcemia   . Hyperlipidemia   . Hyperparathyroidism ([place])   . Osteoporosis   . Pneumonia   . Pulmonary mycobacterial infection ([place]) 03/14/2019  . RA (rheumatoid arthritis) ([place])   . Vertigo   . Vitamin D deficiency     Past Surgical History:  Procedure Laterality Date  . CARPAL TUNNEL RELEASE    . CATARACT EXTRACTION    . IR IMAGING GUIDED PORT INSERTION  04/05/2019  . MASTECTOMY Left   . VESICOVAGINAL FISTULA CLOSURE W/ TAH    . VIDEO BRONCHOSCOPY WITH ENDOBRONCHIAL ULTRASOUND N/A 01/18/2019   Procedure: VIDEO BRONCHOSCOPY WITH ENDOBRONCHIAL ULTRASOUND;  Surgeon: Candee Furbish, MD;  Location: [facility] OR;  Service: Thoracic;  Laterality: N/A;    Family History  Problem Relation Age of Onset  . Heart disease Father   . Bone cancer Father   . Emphysema Father  never smoker  . Heart disease Mother   . Colon cancer Mother   . Melanoma Sister   . Kidney cancer Brother       Social History   Socioeconomic History  . Marital status: Widowed    Spouse name: Not on file  . Number of children: Not on file  . Years of education: Not on file  . Highest education level: Not on file  Occupational History  . Occupation: Retired   Tobacco Use  . Smoking status: Former Smoker    Packs/day: 0.25    Years: 62.00    Pack years: 15.50     Types: Cigarettes    Quit date: 01/12/2019    Years since quitting: 0.3  . Smokeless tobacco: Never Used  Substance and Sexual Activity  . Alcohol use: No    Alcohol/week: 0.0 standard drinks  . Drug use: No  . Sexual activity: Not on file  Other Topics Concern  . Not on file  Social History Narrative  . Not on file   Social Determinants of Health   Financial Resource Strain:   . Difficulty of Paying Living Expenses: Not on file  Food Insecurity:   . Worried About Charity fundraiser in the Last Year: Not on file  . Ran Out of Food in the Last Year: Not on file  Transportation Needs:   . Lack of Transportation (Medical): Not on file  . Lack of Transportation (Non-Medical): Not on file  Physical Activity:   . Days of Exercise per Week: Not on file  . Minutes of Exercise per Session: Not on file  Stress:   . Feeling of Stress : Not on file  Social Connections:   . Frequency of Communication with Friends and Family: Not on file  . Frequency of Social Gatherings with Friends and Family: Not on file  . Attends Religious Services: Not on file  . Active Member of Clubs or Organizations: Not on file  . Attends Archivist Meetings: Not on file  . Marital Status: Not on file    Allergies  Allergen Reactions  . Buprenorphine Hcl Nausea And Vomiting  . Morphine And Related Nausea And Vomiting          Current Outpatient Medications:  .  acetaminophen (TYLENOL) 500 MG tablet, Take 1,000 mg by mouth every 8 (eight) hours as needed., Disp: , Rfl:  .  albuterol (VENTOLIN HFA) 108 (90 Base) MCG/ACT inhaler, Inhale 2 puffs into the lungs every 6 (six) hours as needed for wheezing or shortness of breath. , Disp: , Rfl:  .  AMBULATORY NON FORMULARY MEDICATION, Take 100 mg by mouth daily. Medication Name: clofazimine for mycobacterium abscessus pulmonary infection, Disp: 100 capsule, Rfl: 2 .  fluconazole (DIFLUCAN) 100 MG tablet, Take 1 tablet (100 mg total) by mouth daily.,  Disp: 14 tablet, Rfl: 0 .  lidocaine-prilocaine (EMLA) cream, Apply 1 application topically as needed., Disp: 30 g, Rfl: 0 .  linezolid (ZYVOX) 600 MG tablet, Take 1 tablet (600 mg total) by mouth 2 (two) times daily., Disp: 60 tablet, Rfl: 11 .  mometasone-formoterol (DULERA) 100-5 MCG/ACT AERO, Inhale 2 puffs into the lungs 2 (two) times daily., Disp: , Rfl:  .  mupirocin ointment (BACTROBAN) 2 %, Apply 1 application topically 2 (two) times daily., Disp: 30 g, Rfl: 2 .  Polyethyl Glycol-Propyl Glycol (SYSTANE OP), Place 1 drop into both eyes daily as needed (dry eyes)., Disp: , Rfl:  .  torsemide (DEMADEX) 5  MG tablet, Take 5 mg by mouth daily., Disp: , Rfl:  .  traMADol (ULTRAM) 50 MG tablet, Take 50 mg by mouth 2 (two) times daily as needed for moderate pain. , Disp: , Rfl:  .  triamcinolone ointment (KENALOG) 0.1 %, Apply 1 application topically 2 (two) times daily as needed (dry skin). , Disp: , Rfl:  .  cefOXitin (MEFOXIN) 2 g injection, , Disp: , Rfl:  .  Certolizumab Pegol (CIMZIA [place]), Inject 2 Doses/Fill into the skin every 28 (twenty-eight) days., Disp: , Rfl:  .  predniSONE (DELTASONE) 1 MG tablet, Take 1 mg by mouth 2 (two) times daily with a meal., Disp: , Rfl:  .  simvastatin (ZOCOR) 40 MG tablet, Take 40 mg by mouth at bedtime. , Disp: , Rfl:    Review of Systems  Constitutional: Positive for fatigue and unexpected weight change. Negative for chills and fever.  HENT: Negative for congestion and sore throat.   Eyes: Negative for photophobia.  Respiratory: Negative for shortness of breath and wheezing.   Cardiovascular: Negative for chest pain, palpitations and leg swelling.  Gastrointestinal: Negative for abdominal pain, blood in stool, constipation, diarrhea, nausea and vomiting.  Genitourinary: Negative for dysuria, flank pain and hematuria.  Musculoskeletal: Negative for back pain and myalgias.  Skin: Negative for rash.  Neurological: Negative for dizziness, weakness and  headaches.  Hematological: Does not bruise/bleed easily.  Psychiatric/Behavioral: Negative for behavioral problems, confusion, decreased concentration, dysphoric mood and suicidal ideas.       Objective:   Physical Exam Constitutional:      Appearance: She is underweight. She is not diaphoretic.  HENT:     Head: Normocephalic and atraumatic.     Right Ear: External ear normal.     Left Ear: External ear normal.     Nose: Nose normal.     Mouth/Throat:     Pharynx: No oropharyngeal exudate.  Eyes:     General: No scleral icterus.    Conjunctiva/sclera: Conjunctivae normal.     Pupils: Pupils are equal, round, and reactive to light.  Cardiovascular:     Rate and Rhythm: Normal rate and regular rhythm.     Heart sounds: Normal heart sounds. No murmur. No friction rub. No gallop.   Pulmonary:     Effort: Pulmonary effort is normal. No respiratory distress.     Breath sounds: No wheezing or rales.  Abdominal:     General: Bowel sounds are normal. There is no distension.     Palpations: Abdomen is soft.     Tenderness: There is no abdominal tenderness. There is no rebound.  Musculoskeletal:        General: No tenderness. Normal range of motion.     Cervical back: Normal range of motion and neck supple.  Lymphadenopathy:     Cervical: No cervical adenopathy.  Skin:    General: Skin is warm and dry.     Coloration: Skin is not pale.     Findings: No erythema or rash.  Neurological:     General: No focal deficit present.     Mental Status: She is alert and oriented to person, place, and time.     Coordination: Coordination normal.  Psychiatric:        Attention and Perception: Attention and perception normal.        Mood and Affect: Affect normal. Mood is anxious.        Speech: Speech normal.        Behavior: Behavior  normal.        Thought Content: Thought content normal.        Cognition and Memory: Cognition and memory normal.        Judgment: Judgment normal.            Assessment & Plan:   Fatigue and excessive sleepiness: I suspect this is from a drug-induced anemia.  Discussed with ID pharmacy here we are to drop her linezolid dose to 600 mg once daily.  I have offered also to arrange for blood transfusion to the patient but she is fearful of this would like to see what happens to her hemoglobin with reduction in the dose and time.  Pulmonary Mycobacterium abscessus infection:   Continue current regimen with modification on the Zyvox.  Of note I would like to mention that her son was questioning whether she was really taking the pills religiously or not.  I have asked her to bring the bottles with her next time she comes.   I doubt that she will take the Zyvox for terribly long period of time so we will need to find another drug to swap in which we may try to do with oral omadacycline  I DO NOT want to use amikacin with her given HIGH risk of ototoxicity and nephrotoxicity.  I spent greater than 25 minutes with the patient including 50% of time counseling the patient regarding the nature of her mycobacterial infection consulting with infectious disease pharmacy and reviewing all of her laboratory data from home health.

## 2019-05-06 DIAGNOSIS — M069 Rheumatoid arthritis, unspecified: Secondary | ICD-10-CM | POA: Diagnosis not present

## 2019-05-06 DIAGNOSIS — Z5181 Encounter for therapeutic drug level monitoring: Secondary | ICD-10-CM | POA: Diagnosis not present

## 2019-05-06 DIAGNOSIS — Z9012 Acquired absence of left breast and nipple: Secondary | ICD-10-CM | POA: Diagnosis not present

## 2019-05-06 DIAGNOSIS — E785 Hyperlipidemia, unspecified: Secondary | ICD-10-CM | POA: Diagnosis not present

## 2019-05-06 DIAGNOSIS — Z1624 Resistance to multiple antibiotics: Secondary | ICD-10-CM | POA: Diagnosis not present

## 2019-05-06 DIAGNOSIS — Z792 Long term (current) use of antibiotics: Secondary | ICD-10-CM | POA: Diagnosis not present

## 2019-05-06 DIAGNOSIS — Z853 Personal history of malignant neoplasm of breast: Secondary | ICD-10-CM | POA: Diagnosis not present

## 2019-05-06 DIAGNOSIS — F17210 Nicotine dependence, cigarettes, uncomplicated: Secondary | ICD-10-CM | POA: Diagnosis not present

## 2019-05-06 DIAGNOSIS — E213 Hyperparathyroidism, unspecified: Secondary | ICD-10-CM | POA: Diagnosis not present

## 2019-05-06 DIAGNOSIS — E559 Vitamin D deficiency, unspecified: Secondary | ICD-10-CM | POA: Diagnosis not present

## 2019-05-06 DIAGNOSIS — J449 Chronic obstructive pulmonary disease, unspecified: Secondary | ICD-10-CM | POA: Diagnosis not present

## 2019-05-06 DIAGNOSIS — M810 Age-related osteoporosis without current pathological fracture: Secondary | ICD-10-CM | POA: Diagnosis not present

## 2019-05-06 DIAGNOSIS — A310 Pulmonary mycobacterial infection: Secondary | ICD-10-CM | POA: Diagnosis not present

## 2019-05-06 DIAGNOSIS — Z452 Encounter for adjustment and management of vascular access device: Secondary | ICD-10-CM | POA: Diagnosis not present

## 2019-05-09 ENCOUNTER — Other Ambulatory Visit: Payer: Self-pay | Admitting: Thoracic Surgery (Cardiothoracic Vascular Surgery)

## 2019-05-09 DIAGNOSIS — J9859 Other diseases of mediastinum, not elsewhere classified: Secondary | ICD-10-CM

## 2019-05-10 DIAGNOSIS — Z1624 Resistance to multiple antibiotics: Secondary | ICD-10-CM | POA: Diagnosis not present

## 2019-05-10 DIAGNOSIS — J449 Chronic obstructive pulmonary disease, unspecified: Secondary | ICD-10-CM | POA: Diagnosis not present

## 2019-05-10 DIAGNOSIS — E785 Hyperlipidemia, unspecified: Secondary | ICD-10-CM | POA: Diagnosis not present

## 2019-05-10 DIAGNOSIS — A310 Pulmonary mycobacterial infection: Secondary | ICD-10-CM | POA: Diagnosis not present

## 2019-05-10 DIAGNOSIS — M069 Rheumatoid arthritis, unspecified: Secondary | ICD-10-CM | POA: Diagnosis not present

## 2019-05-15 MED FILL — LIDOCAINE-PRILOCAINE CREAM: 2.5-2.5 | 20 days supply | Qty: 30 | Fill #0

## 2019-05-15 MED FILL — LINEZOLID 600 MG TAB: 600 | 30 days supply | Qty: 30 | Fill #0

## 2019-05-17 ENCOUNTER — Emergency Department (HOSPITAL_BASED_OUTPATIENT_CLINIC_OR_DEPARTMENT_OTHER)
Admission: EM | Admit: 2019-05-17 | Discharge: 2019-05-17 | Discharge status: Absent without leave | Payer: Medicare Other

## 2019-05-17 ENCOUNTER — Inpatient Hospital Stay (HOSPITAL_COMMUNITY)
Admission: EM | Admit: 2019-05-17 | Discharge: 2019-05-22 | DRG: 177 | Disposition: A | Discharge status: Home Health | Payer: Medicare Other | Attending: Internal Medicine | Admitting: Internal Medicine

## 2019-05-17 ENCOUNTER — Observation Stay (HOSPITAL_COMMUNITY): Payer: Medicare Other

## 2019-05-17 ENCOUNTER — Other Ambulatory Visit: Payer: Self-pay

## 2019-05-17 ENCOUNTER — Telehealth: Payer: Self-pay | Admitting: *Deleted

## 2019-05-17 ENCOUNTER — Encounter (HOSPITAL_COMMUNITY): Payer: Self-pay | Admitting: Emergency Medicine

## 2019-05-17 DIAGNOSIS — Z1624 Resistance to multiple antibiotics: Secondary | ICD-10-CM | POA: Diagnosis not present

## 2019-05-17 DIAGNOSIS — J9601 Acute respiratory failure with hypoxia: Secondary | ICD-10-CM | POA: Diagnosis not present

## 2019-05-17 DIAGNOSIS — E213 Hyperparathyroidism, unspecified: Secondary | ICD-10-CM | POA: Diagnosis present

## 2019-05-17 DIAGNOSIS — U071 COVID-19: Principal | ICD-10-CM | POA: Diagnosis present

## 2019-05-17 DIAGNOSIS — R0602 Shortness of breath: Secondary | ICD-10-CM | POA: Diagnosis not present

## 2019-05-17 DIAGNOSIS — D649 Anemia, unspecified: Secondary | ICD-10-CM | POA: Diagnosis present

## 2019-05-17 DIAGNOSIS — Z79891 Long term (current) use of opiate analgesic: Secondary | ICD-10-CM

## 2019-05-17 DIAGNOSIS — Z825 Family history of asthma and other chronic lower respiratory diseases: Secondary | ICD-10-CM

## 2019-05-17 DIAGNOSIS — J90 Pleural effusion, not elsewhere classified: Secondary | ICD-10-CM | POA: Diagnosis not present

## 2019-05-17 DIAGNOSIS — E785 Hyperlipidemia, unspecified: Secondary | ICD-10-CM | POA: Diagnosis not present

## 2019-05-17 DIAGNOSIS — L8990 Pressure ulcer of unspecified site, unspecified stage: Secondary | ICD-10-CM | POA: Insufficient documentation

## 2019-05-17 DIAGNOSIS — R531 Weakness: Secondary | ICD-10-CM | POA: Diagnosis not present

## 2019-05-17 DIAGNOSIS — D6489 Other specified anemias: Secondary | ICD-10-CM | POA: Diagnosis present

## 2019-05-17 DIAGNOSIS — M069 Rheumatoid arthritis, unspecified: Secondary | ICD-10-CM | POA: Diagnosis present

## 2019-05-17 DIAGNOSIS — R32 Unspecified urinary incontinence: Secondary | ICD-10-CM | POA: Diagnosis present

## 2019-05-17 DIAGNOSIS — A319 Mycobacterial infection, unspecified: Secondary | ICD-10-CM | POA: Diagnosis not present

## 2019-05-17 DIAGNOSIS — E861 Hypovolemia: Secondary | ICD-10-CM | POA: Diagnosis present

## 2019-05-17 DIAGNOSIS — A310 Pulmonary mycobacterial infection: Secondary | ICD-10-CM | POA: Diagnosis present

## 2019-05-17 DIAGNOSIS — Z8701 Personal history of pneumonia (recurrent): Secondary | ICD-10-CM

## 2019-05-17 DIAGNOSIS — Z95828 Presence of other vascular implants and grafts: Secondary | ICD-10-CM

## 2019-05-17 DIAGNOSIS — Z808 Family history of malignant neoplasm of other organs or systems: Secondary | ICD-10-CM

## 2019-05-17 DIAGNOSIS — Z7952 Long term (current) use of systemic steroids: Secondary | ICD-10-CM

## 2019-05-17 DIAGNOSIS — N179 Acute kidney failure, unspecified: Secondary | ICD-10-CM | POA: Diagnosis not present

## 2019-05-17 DIAGNOSIS — Z79899 Other long term (current) drug therapy: Secondary | ICD-10-CM

## 2019-05-17 DIAGNOSIS — E876 Hypokalemia: Secondary | ICD-10-CM | POA: Diagnosis not present

## 2019-05-17 DIAGNOSIS — Z8051 Family history of malignant neoplasm of kidney: Secondary | ICD-10-CM

## 2019-05-17 DIAGNOSIS — E43 Unspecified severe protein-calorie malnutrition: Secondary | ICD-10-CM | POA: Diagnosis not present

## 2019-05-17 DIAGNOSIS — J069 Acute upper respiratory infection, unspecified: Secondary | ICD-10-CM

## 2019-05-17 DIAGNOSIS — Z8249 Family history of ischemic heart disease and other diseases of the circulatory system: Secondary | ICD-10-CM

## 2019-05-17 DIAGNOSIS — Z681 Body mass index (BMI) 19 or less, adult: Secondary | ICD-10-CM

## 2019-05-17 DIAGNOSIS — G4710 Hypersomnia, unspecified: Secondary | ICD-10-CM | POA: Diagnosis present

## 2019-05-17 DIAGNOSIS — F17200 Nicotine dependence, unspecified, uncomplicated: Secondary | ICD-10-CM | POA: Diagnosis present

## 2019-05-17 DIAGNOSIS — Z800 Family history of malignant neoplasm of digestive organs: Secondary | ICD-10-CM

## 2019-05-17 DIAGNOSIS — T368X5A Adverse effect of other systemic antibiotics, initial encounter: Secondary | ICD-10-CM | POA: Diagnosis present

## 2019-05-17 DIAGNOSIS — L89312 Pressure ulcer of right buttock, stage 2: Secondary | ICD-10-CM | POA: Diagnosis present

## 2019-05-17 DIAGNOSIS — I517 Cardiomegaly: Secondary | ICD-10-CM

## 2019-05-17 DIAGNOSIS — J449 Chronic obstructive pulmonary disease, unspecified: Secondary | ICD-10-CM | POA: Diagnosis not present

## 2019-05-17 DIAGNOSIS — E870 Hyperosmolality and hypernatremia: Secondary | ICD-10-CM | POA: Diagnosis not present

## 2019-05-17 DIAGNOSIS — Z9012 Acquired absence of left breast and nipple: Secondary | ICD-10-CM

## 2019-05-17 DIAGNOSIS — D62 Acute posthemorrhagic anemia: Secondary | ICD-10-CM | POA: Diagnosis present

## 2019-05-17 DIAGNOSIS — Z853 Personal history of malignant neoplasm of breast: Secondary | ICD-10-CM

## 2019-05-17 DIAGNOSIS — M810 Age-related osteoporosis without current pathological fracture: Secondary | ICD-10-CM | POA: Diagnosis present

## 2019-05-17 LAB — COMPREHENSIVE METABOLIC PANEL
ALT: 14 U/L (ref 0–44)
AST: 26 U/L (ref 15–41)
Albumin: 2.2 g/dL — ABNORMAL LOW (ref 3.5–5.0)
Alkaline Phosphatase: 112 U/L (ref 38–126)
Anion gap: 10 (ref 5–15)
BUN: 16 mg/dL (ref 8–23)
CO2: 30 mmol/L (ref 22–32)
Calcium: 9.5 mg/dL (ref 8.9–10.3)
Chloride: 100 mmol/L (ref 98–111)
Creatinine, Ser: 1.95 mg/dL — ABNORMAL HIGH (ref 0.44–1.00)
GFR calc Af Amer: 28 mL/min — ABNORMAL LOW (ref >60)
GFR calc non Af Amer: 24 mL/min — ABNORMAL LOW (ref >60)
Glucose, Bld: 93 mg/dL (ref 70–99)
Potassium: 3.3 mmol/L — ABNORMAL LOW (ref 3.5–5.1)
Sodium: 140 mmol/L (ref 135–145)
Total Bilirubin: 0.1 mg/dL — ABNORMAL LOW (ref 0.3–1.2)
Total Protein: 6.6 g/dL (ref 6.5–8.1)

## 2019-05-17 LAB — CBC
HCT: 22.7 % — ABNORMAL LOW (ref 36.0–46.0)
Hemoglobin: 6.8 g/dL — CL (ref 12.0–15.0)
MCH: 24.1 pg — ABNORMAL LOW (ref 26.0–34.0)
MCHC: 30 g/dL (ref 30.0–36.0)
MCV: 80.5 fL (ref 80.0–100.0)
Platelets: 222 10*3/uL (ref 150–400)
RBC: 2.82 MIL/uL — ABNORMAL LOW (ref 3.87–5.11)
RDW: 17.3 % — ABNORMAL HIGH (ref 11.5–15.5)
WBC: 6.4 10*3/uL (ref 4.0–10.5)
nRBC: 0.3 % — ABNORMAL HIGH (ref 0.0–0.2)

## 2019-05-17 LAB — BRAIN NATRIURETIC PEPTIDE: B Natriuretic Peptide: 339.6 pg/mL — ABNORMAL HIGH (ref 0.0–100.0)

## 2019-05-17 LAB — ABO/RH: ABO/RH(D): A POS

## 2019-05-17 LAB — PREPARE RBC (CROSSMATCH)

## 2019-05-17 MED ORDER — MOMETASONE FURO-FORMOTEROL FUM 100-5 MCG/ACT IN AERO
2.0000 | INHALATION_SPRAY | Freq: Two times a day (BID) | RESPIRATORY_TRACT | Status: DC
  Administered 2019-05-17 – 2019-05-22 (×10): 2 via RESPIRATORY_TRACT
  Filled 2019-05-17: qty 8.8

## 2019-05-17 MED ORDER — SODIUM CHLORIDE 0.9 % IV SOLN: 10.0000 mL/h | Freq: Once | INTRAVENOUS | Status: DC

## 2019-05-17 MED ORDER — SODIUM CHLORIDE 0.9 % IV SOLN: 8.0000 g | INTRAVENOUS | Status: DC

## 2019-05-17 MED ORDER — POLYETHYL GLYCOL-PROPYL GLYCOL 0.4-0.3 % OP GEL: Freq: Every day | OPHTHALMIC | Status: DC | PRN

## 2019-05-17 MED ORDER — POLYVINYL ALCOHOL 1.4 % OP SOLN: 1.0000 [drp] | OPHTHALMIC | Status: DC | PRN

## 2019-05-17 MED ORDER — CHLORHEXIDINE GLUCONATE CLOTH 2 % EX PADS
6.0000 | MEDICATED_PAD | Freq: Every day | CUTANEOUS | Status: DC
  Administered 2019-05-18 – 2019-05-22 (×5): 6 via TOPICAL

## 2019-05-17 MED ORDER — SODIUM CHLORIDE 0.9% FLUSH
10.0000 mL | INTRAVENOUS | Status: DC | PRN
  Administered 2019-05-20: 10 mL

## 2019-05-17 MED ORDER — ACETAMINOPHEN 650 MG RE SUPP: 650.0000 mg | Freq: Four times a day (QID) | RECTAL | Status: DC | PRN

## 2019-05-17 MED ORDER — SODIUM CHLORIDE 0.9 % IV SOLN
2.0000 g | Freq: Two times a day (BID) | INTRAVENOUS | Status: DC
  Administered 2019-05-17: 2 g via INTRAVENOUS
  Filled 2019-05-17 (×3): qty 2

## 2019-05-17 MED ORDER — ACETAMINOPHEN 325 MG PO TABS: 650.0000 mg | ORAL_TABLET | Freq: Four times a day (QID) | ORAL | Status: DC | PRN

## 2019-05-17 MED ORDER — ALBUTEROL SULFATE (2.5 MG/3ML) 0.083% IN NEBU: 2.5000 mg | INHALATION_SOLUTION | Freq: Four times a day (QID) | RESPIRATORY_TRACT | Status: DC | PRN

## 2019-05-17 MED ORDER — LIDOCAINE-PRILOCAINE 2.5-2.5 % EX CREA: 1.0000 "application " | TOPICAL_CREAM | CUTANEOUS | Status: DC | PRN

## 2019-05-17 MED ORDER — SODIUM CHLORIDE 0.9 % IV SOLN: 2.0000 g | Freq: Once | INTRAVENOUS | Status: DC

## 2019-05-17 MED ORDER — BUPIVACAINE HCL (PF) 0.5 % IJ SOLN: 20.0000 mL | Freq: Once | INTRAMUSCULAR | Status: DC

## 2019-05-17 MED ORDER — ONDANSETRON HCL 4 MG/2ML IJ SOLN: 4.0000 mg | Freq: Four times a day (QID) | INTRAMUSCULAR | Status: DC | PRN

## 2019-05-17 MED ORDER — ONDANSETRON HCL 4 MG PO TABS: 4.0000 mg | ORAL_TABLET | Freq: Four times a day (QID) | ORAL | Status: DC | PRN

## 2019-05-17 MED ORDER — TRAMADOL HCL 50 MG PO TABS: 50.0000 mg | ORAL_TABLET | Freq: Two times a day (BID) | ORAL | Status: DC | PRN

## 2019-05-17 NOTE — ED Triage Notes (Signed)
Pt reports that she was told to come to the ED for blood transfusion. Reports her levels were low this morning but doesn't know what the result was. Reports dropping weekly from 10 to a 5 or 6 something.  Reports that she feels weaker and weaker, esp when having to walk a lot, gets SOB.

## 2019-05-17 NOTE — Telephone Encounter (Signed)
Patient called and asked to speak with a nurse. She states that she does not feeling well. She feels weak, gets out of breath after walking a short distance.  She states that her [place] was at the house this morning and told her that her "levels were low."  Patient states she has reconsidered and would like to proceed with the blood transfusion that was discussed at her office visit with Dr Tommy Medal 05/03/19. At that time, the lab available was from 04/26/19 and her hemoglobin was 7.4.  Dr Tommy Medal offered to set up transfusion based on that result plus her symptoms, but patient deferred, as she was fearful and wanted to see what the next lab draw would show.  Writer is unable to pull up most recent lab results through [facility], advised I would call for outpatient blood transfusion appointment availability and would call [facility] pharmacy for lab values.  Per Patient [place], next available transfusion appointment is 05/29/19.  Per [facility], next available transfusion appointment is 05/24/19.  [facility] at [facility] advised I could call bed placement and request an observation bed, but this is dependent on hospital census.   RN spoke with Ladona Ridgel, [facility] pharmacist and asked for most recent hemoglobin.  Jeani Hawking advised they were critical levels of 6.7 (05/03/19) and 6.3 (05/10/19) with hematocrit 20.4 (05/10/19).  New labs were drawn this morning, have not yet resulted. Jeani Hawking states he did not call these critical lab values to [facility], as he faxed them to [phone]. There is no documentation of these critical lab results being called from either [facility] or [facility] pharmacy. Jeani Hawking states that the [place] told him that the patient's vital signs were "fine" and "she seemed in no distress."  RN asked Jeani Hawking if the nurse did an assessment this morning, advising that the patient called [facility]  after the nurse left and reported her declining symptoms. Jeani Hawking stated he only saw the vital signs and the note that she seemed in no distress, but saw no further assessment.  As patient is symptomatic and her levels have been at critical levels since 05/03/19, RN advised patient, her daughter and granddaughter that it would be best if she went to the emergency room for evaluation. Patient's daughter states she would like to take her to [place] if possible.  RN called the charge nurse at [facility] ER and spoke with nurse Mal[place], alerting him of the patient's lab results and symptoms and that Mrs. Sylvester would be arriving shortly via personal transportation.  RN spoke with patient's granddaughter to relay that information.  Landis Gandy, RN

## 2019-05-17 NOTE — Telephone Encounter (Signed)
Patient called and asked to speak with a nurse. She states that she does not feeling well. She feels weak, gets out of breath after walking a short distance.  She states that her [place] was at the house this morning and told her that her "levels were low."  Patient states she has reconsidered and would like to proceed with the blood transfusion that was discussed at her office visit with Dr Tommy Medal 05/03/19. At that time, the lab available was from 04/26/19 and her hemoglobin was 7.4.  Dr Tommy Medal offered to set up transfusion based on that result plus her symptoms, but patient deferred, as she was fearful and wanted to see what the next lab draw would show.  Writer is unable to pull up most recent lab results through [facility], advised I would call for outpatient blood transfusion appointment availability and would call [facility] for lab values.  Per Patient [place], next available transfusion appointment is 05/29/19.  Per [facility], next available transfusion appointment is 05/24/19.  [facility] at [facility] Stay advised I could call bed placement and request an observation bed, but this is dependent on hospital census.   RN spoke with Ladona Ridgel, [facility] pharmacist and asked for most recent hemoglobin.  Jeani Hawking advised they were critical levels of 6.7 (05/03/19) and 6.3 (05/10/19) with hematocrit 20.4 (05/10/19).  New labs were drawn this morning, have not yet resulted. Jeani Hawking states he did not call these critical lab values to [facility], as he faxed them to [phone]. There is no documentation of these critical lab results being called from either [facility] or [facility] pharmacy. Jeani Hawking states that the [place] told him that the patient's vital signs were "fine" and "she seemed in no distress."  RN asked Jeani Hawking if the nurse did an assessment this morning, advising that the patient called [facility]  after the nurse left and reported her declining symptoms. Jeani Hawking stated he only saw the vital signs and the note that she seemed in no distress, but saw no further assessment.  As patient is symptomatic and her levels have been at critical levels since 05/03/19, RN advised patient, her daughter and granddaughter that it would be best if she went to the emergency room for evaluation. Patient's daughter states she would like to take her to [place] if possible.  RN called the charge nurse at [facility] ER and spoke with nurse Mali, alerting him of the patient's lab results and symptoms and that Mrs. Garrels would be arriving shortly via personal transportation.  RN spoke with patient's granddaughter to relay that information.  Landis Gandy, RN

## 2019-05-17 NOTE — Progress Notes (Signed)
Patient noted to be connected to a cefoxitin  infusion through a porta cath and portable pump.  Pharmacy notified and infusion stopped.  Dose rescheduled.  IV team also notified of port.  Patient informed and educated.    Virginia Rochester, RN

## 2019-05-17 NOTE — H&P (Signed)
History and Physical    Olivia Hooper [id number] DOB: 02-26-1941 DOA: 05/17/2019  PCP: [name], MD   Patient coming from: Home.  I have personally briefly reviewed patient's old medical records in [place]  Chief Complaint: Abnormal lab result.  HPI: Olivia Hooper is a 79 y.o. female with medical history significant of anemia, left wrist cancer, carpal tunnel syndrome, COPD, drug-induced anemia, dyspnea, hypercalcemia, hyperlipidemia, hyperparathyroidism, osteoporosis, history of pneumonia rheumatoid arthritis, history of pulmonary mycobacterial infection, vertigo, vitamin D deficiency who is coming to the emergency department after she was told by her doctor there her myoglobin level was low.  She mentions she has been dyspneic, decreased appetite, has been craving water, has had some bilateral lower and left upper extremity edema and hypersomnia. No chest pain, but has had some dizziness and palpitations, She denies fever, chills, sore throat, abdominal pain, nausea, hematemesis, hematochezia or melena.  No diarrhea or constipation.  Denies dysuria, frequency or hematuria.    ED Course: Initial vital signs temperature 98.5 F, pulse 88, respirations 20, blood pressure 118/51 mmHg and O2 sat 91% on room air.  O2 sat increased to 100% on nasal cannula oxygen.  The patient was started on a 1 unit PRBC transfusion.  White count 6.4, hemoglobin 6.8 g/dL and platelets 222.  CMP shows a potassium of 3.3 mmol/L.  Creatinine was 1.95 mg/dL and albumin is 2.2 g/dL.  Her chest radiograph shows small bilateral pleural effusions which are new from prior exam.  Cavitary lesion is decreasing in size.  Review of Systems: As per HPI otherwise 10 point review of systems negative.   Past Medical History:  Diagnosis Date  . Anemia 05/03/2019  . Breast cancer ([place])    on left  . Carpal tunnel syndrome   . COPD (chronic obstructive pulmonary disease) ([place])   . Drug-induced anemia 05/03/2019  .  Dyspnea   . Hypercalcemia   . Hyperlipidemia   . Hyperparathyroidism ([place])   . Osteoporosis   . Pneumonia   . Pulmonary mycobacterial infection ([place]) 03/14/2019  . RA (rheumatoid arthritis) ([place])   . Sleeping excessive 05/03/2019  . Vertigo   . Vitamin D deficiency     Past Surgical History:  Procedure Laterality Date  . CARPAL TUNNEL RELEASE    . CATARACT EXTRACTION    . IR IMAGING GUIDED PORT INSERTION  04/05/2019  . MASTECTOMY Left   . VESICOVAGINAL FISTULA CLOSURE W/ TAH    . VIDEO BRONCHOSCOPY WITH ENDOBRONCHIAL ULTRASOUND N/A 01/18/2019   Procedure: VIDEO BRONCHOSCOPY WITH ENDOBRONCHIAL ULTRASOUND;  Surgeon: Candee Furbish, MD;  Location: [facility];  Service: Thoracic;  Laterality: N/A;     reports that she quit smoking about 4 months ago. Her smoking use included cigarettes. She has a 15.50 pack-year smoking history. She has never used smokeless tobacco. She reports that she does not drink alcohol or use drugs.  Allergies  Allergen Reactions  . Buprenorphine Hcl Nausea And Vomiting  . Morphine And Related Nausea And Vomiting         Family History  Problem Relation Age of Onset  . Heart disease Father   . Bone cancer Father   . Emphysema Father        never smoker  . Heart disease Mother   . Colon cancer Mother   . Melanoma Sister   . Kidney cancer Brother    Prior to Admission medications   Medication Sig Start Date End Date Taking? Authorizing Provider  acetaminophen (TYLENOL) 500  MG tablet Take 500 mg by mouth every 8 (eight) hours as needed for mild pain.    Yes [provider]  albuterol (VENTOLIN HFA) 108 (90 Base) MCG/ACT inhaler Inhale 2 puffs into the lungs every 6 (six) hours as needed for wheezing or shortness of breath.    Yes [provider]  AMBULATORY NON FORMULARY MEDICATION Take 100 mg by mouth daily. 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 Patient taking differently: Apply 1 application topically as needed (access port).  05/03/19  Yes Tommy Medal, Lavell Islam, MD  mometasone-formoterol [facility]) 100-5 MCG/ACT AERO Inhale 2 puffs into the lungs 2 (two) times daily as needed for wheezing or shortness of breath.    Yes [provider]  mupirocin ointment (BACTROBAN) 2 % Apply 1 application topically 2 (two) times daily. 05/01/19  Yes Johna Slade, DPM  Polyethyl Glycol-Propyl Glycol (SYSTANE OP) Place 1 drop into both eyes daily as needed (dry eyes).   Yes [provider]  torsemide (DEMADEX) 5 MG tablet Take 5 mg by mouth daily. 04/20/19  Yes [provider]  traMADol (ULTRAM) 50 MG tablet Take 50 mg by mouth 2 (two) times daily as needed for moderate pain.    Yes [provider]  fluconazole (DIFLUCAN) 100 MG tablet Take 1 tablet (100 mg total) by mouth daily. Patient not taking: Reported on 05/17/2019 04/14/19   Tommy Medal, Lavell Islam, MD  linezolid (ZYVOX) 600 MG tablet Take 1 tablet (600 mg total) by mouth daily. Patient not taking: Reported on 05/17/2019 05/03/19   Tommy Medal, Lavell Islam, MD    Physical Exam: Vitals:   05/17/19 1445 05/17/19 1500 05/17/19 1519 05/17/19 1600  BP: (!) 119/55  135/60   Pulse: 71  87   Resp:  16 16   Temp:      TempSrc:      SpO2: 100% 99% 100%   Weight:    47.2 kg    Constitutional: NAD, calm, comfortable Eyes: PERRL, lids and conjunctivae are pale. ENMT: Mucous membranes are moist. Posterior pharynx clear of any exudate or lesions. Neck: normal, supple, no masses, no thyromegaly Respiratory: Mild bilateral wheezing, no rhonchi or crackles. Normal respiratory effort. No accessory muscle use.  Cardiovascular: Regular rate and rhythm, no murmurs / rubs / gallops. No extremity edema. 2+ pedal pulses. No carotid bruits.  Abdomen: Nondistended.  BS positive.  Soft, no tenderness, no masses  palpated. No hepatosplenomegaly.   Musculoskeletal: no clubbing / cyanosis. Good ROM, no contractures. Normal muscle tone.  Skin: Skin is pale.  Some areas of ecchymosis from venipuncture. Neurologic: CN 2-12 grossly intact. Sensation intact, DTR normal. Strength 5/5 in all 4.  Psychiatric: Normal judgment and insight. Alert and oriented x 3. Normal mood.   Labs on Admission: I have personally reviewed following labs and imaging studies  CBC: Recent Labs  Lab 05/17/19 1412  WBC 6.4  HGB 6.8*  HCT 22.7*  MCV 80.5  PLT [id number]   Basic Metabolic Panel: Recent Labs  Lab 05/17/19 1412  NA 140  K 3.3*  CL 100  CO2 30  GLUCOSE 93  BUN 16  CREATININE 1.95*  CALCIUM 9.5   GFR: Estimated Creatinine Clearance: 17.4 mL/min (A) (by C-G formula based on SCr of 1.95 mg/dL (H)). Liver Function Tests: Recent Labs  Lab 05/17/19 1412  AST 26  ALT 14  ALKPHOS  112  BILITOT 0.1*  PROT 6.6  ALBUMIN 2.2*   No results for input(s): LIPASE, AMYLASE in the last 168 hours. No results for input(s): AMMONIA in the last 168 hours. Coagulation Profile: No results for input(s): INR, PROTIME in the last 168 hours. Cardiac Enzymes: No results for input(s): CKTOTAL, CKMB, CKMBINDEX, TROPONINI in the last 168 hours. BNP (last 3 results) No results for input(s): PROBNP in the last 8760 hours. HbA1C: No results for input(s): HGBA1C in the last 72 hours. CBG: No results for input(s): GLUCAP in the last 168 hours. Lipid Profile: No results for input(s): CHOL, HDL, LDLCALC, TRIG, CHOLHDL, LDLDIRECT in the last 72 hours. Thyroid Function Tests: No results for input(s): TSH, T4TOTAL, FREET4, T3FREE, THYROIDAB in the last 72 hours. Anemia Panel: No results for input(s): VITAMINB12, FOLATE, FERRITIN, TIBC, IRON, RETICCTPCT in the last 72 hours. Urine analysis: No results found for: COLORURINE, APPEARANCEUR, LABSPEC, PHURINE, GLUCOSEU, HGBUR, BILIRUBINUR, KETONESUR, PROTEINUR, UROBILINOGEN, NITRITE,  LEUKOCYTESUR  Radiological Exams on Admission: No results found.  EKG: Independently reviewed.   Assessment/Plan Principal Problem:   Symptomatic acute blood loss anemia Observation/MedSurg. Transfuse 1 unit of PRBC. Follow-up H&H.  Active Problems:   AKI (acute kidney injury) ([place]) In the setting of hypovolemia. Check urinalysis. Follow-up renal function and electrolytes.    Cardiomegaly and bilateral pleural effusion Check EKG. Check echocardiogram.    COPD GOLD II / still smoking  Smoking cessation advised. Continue supplemental oxygen. Bronchodilators as needed.    Rheumatoid arthritis (HCC) Analgesics as needed.    Pulmonary mycobacterial infection ([place]) Continue cefoxitin per pharmacy.    DVT prophylaxis: SCDs. Code Status: Full code. Family Communication: Disposition Plan: Patient on for PRBC transfusion. Consults called: Admission status: Observation/telemetry.   Reubin Milan MD [facility]  If 7PM-7AM, please contact night-coverage [email or web address]  05/17/2019, 4:54 PM   [place] was prepared using Dragon voice recognition software and may contain some unintended transcription errors.

## 2019-05-17 NOTE — ED Notes (Signed)
Transport called.

## 2019-05-17 NOTE — ED Provider Notes (Signed)
Olivia Hooper DEPT Provider Note   CSN: [id number] Arrival date & time: 05/17/19  1244     History No chief complaint on file.   Olivia Hooper is a 79 y.o. female.  HPI Patient presents to the emergency department with symptomatic anemia.  The patient states has been having weakness and dizziness and her hemoglobin continues to drop over the last few weeks.  The patient states she been getting more more weak and fatigued.  Patient states that she is taking medications for RA through a pump and port.  The patient states that she did not take any other medications other than her prescribed medications.  She states that she was sent here by her doctor for admission and transfusion.  The patient denies chest pain, shortness of breath, headache,blurred vision, neck pain, fever, cough, weakness, numbness, dizziness, anorexia, edema, abdominal pain, nausea, vomiting, diarrhea, rash, back pain, dysuria, hematemesis, bloody stool, near syncope, or syncope.    Past Medical History:  Diagnosis Date  . Anemia 05/03/2019  . Breast cancer ([place])    on left  . Carpal tunnel syndrome   . COPD (chronic obstructive pulmonary disease) ([place])   . Drug-induced anemia 05/03/2019  . Dyspnea   . Hypercalcemia   . Hyperlipidemia   . Hyperparathyroidism ([place])   . Osteoporosis   . Pneumonia   . Pulmonary mycobacterial infection ([place]) 03/14/2019  . RA (rheumatoid arthritis) ([place])   . Sleeping excessive 05/03/2019  . Vertigo   . Vitamin D deficiency     Patient Active Problem List   Diagnosis Date Noted  . Anemia 05/03/2019  . Drug-induced anemia 05/03/2019  . Sleeping excessive 05/03/2019  . Pulmonary mycobacterial infection ([place]) 03/14/2019  . Cavitating mass in right upper lung lobe 01/31/2019  . S/P eye surgery 01/25/2015  . 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 Age-related macular degeneration,  dry, both eyes 11/30/2014  . Pseudophakia of both eyes 11/30/2014  . Pulmonary nodules 03/18/2014  . Cigarette smoker 03/18/2014  . COPD GOLD II / still smoking  03/15/2014  . Profound fatigue 02/18/2011  . Encounter for long-term (current) use of other medications 01/08/2011  . Rheumatoid arthritis ([place]) 08/21/2009    Past Surgical History:  Procedure Laterality Date  . CARPAL TUNNEL RELEASE    . CATARACT EXTRACTION    . IR IMAGING GUIDED PORT INSERTION  04/05/2019  . MASTECTOMY Left   . VESICOVAGINAL FISTULA CLOSURE W/ TAH    . VIDEO BRONCHOSCOPY WITH ENDOBRONCHIAL ULTRASOUND N/A 01/18/2019   Procedure: VIDEO BRONCHOSCOPY WITH ENDOBRONCHIAL ULTRASOUND;  Surgeon: Candee Furbish, MD;  Location: [facility];  Service: Thoracic;  Laterality: N/A;     OB History   No obstetric history on file.     Family History  Problem Relation Age of Onset  . Heart disease Father   . Bone cancer Father   . Emphysema Father        never smoker  . Heart disease Mother   . Colon cancer Mother   . Melanoma Sister   . Kidney cancer Brother     Social History   Tobacco Use  . Smoking status: Former Smoker    Packs/day: 0.25    Years: 62.00    Pack years: 15.50    Types: Cigarettes    Quit date: 01/12/2019    Years since quitting: 0.3  . Smokeless tobacco: Never Used  Substance Use Topics  . Alcohol  use: No    Alcohol/week: 0.0 standard drinks  . Drug use: No    Home Medications Prior to Admission medications   Medication Sig Start Date End Date Taking? Authorizing Provider  acetaminophen (TYLENOL) 500 MG tablet Take 1,000 mg by mouth every 8 (eight) hours as needed.    [provider]  albuterol (VENTOLIN HFA) 108 (90 Base) MCG/ACT inhaler Inhale 2 puffs into the lungs every 6 (six) hours as needed for wheezing or shortness of breath.     [provider]  AMBULATORY NON FORMULARY MEDICATION Take 100 mg by mouth daily. Medication Name: clofazimine for mycobacterium  abscessus pulmonary infection 03/27/19   Kuppelweiser, [name] L, RPH-CPP  cefOXitin (MEFOXIN) 2 g injection  04/25/19   [provider]  Certolizumab Pegol (CIMZIA [place]) Inject 2 Doses/Fill into the skin every 28 (twenty-eight) days.    [provider]  fluconazole (DIFLUCAN) 100 MG tablet Take 1 tablet (100 mg total) by mouth daily. 04/14/19   Truman Hayward, MD  lidocaine-prilocaine (EMLA) cream Apply 1 application topically as needed. 05/03/19   Truman Hayward, MD  linezolid (ZYVOX) 600 MG tablet Take 1 tablet (600 mg total) by mouth daily. 05/03/19   Truman Hayward, MD  mometasone-formoterol [facility]) 100-5 MCG/ACT AERO Inhale 2 puffs into the lungs 2 (two) times daily.    [provider]  mupirocin ointment (BACTROBAN) 2 % Apply 1 application topically 2 (two) times daily. 05/01/19   Eara Slade, DPM  Polyethyl Glycol-Propyl Glycol (SYSTANE OP) Place 1 drop into both eyes daily as needed (dry eyes).    [provider]  predniSONE (DELTASONE) 1 MG tablet Take 1 mg by mouth 2 (two) times daily with a meal.    [provider]  simvastatin (ZOCOR) 40 MG tablet Take 40 mg by mouth at bedtime.  12/01/18   [provider]  torsemide (DEMADEX) 5 MG tablet Take 5 mg by mouth daily. 04/20/19   [provider]  traMADol (ULTRAM) 50 MG tablet Take 50 mg by mouth 2 (two) times daily as needed for moderate pain.     [provider]  triamcinolone ointment (KENALOG) 0.1 % Apply 1 application topically 2 (two) times daily as needed (dry skin).     [provider]    Allergies    Buprenorphine hcl and Morphine and related  Review of Systems   Review of Systems All other systems negative except as documented in the HPI. All pertinent positives and negatives as reviewed in the HPI. Physical Exam Updated Vital Signs BP 135/60 (BP Location: Right Arm)   Pulse 87   Temp 98.5 F (36.9 C) (Oral)   Resp 16   SpO2  100%   Physical Exam Vitals and nursing note reviewed.  Constitutional:      General: She is not in acute distress.    Appearance: She is well-developed.  HENT:     Head: Normocephalic and atraumatic.  Eyes:     Pupils: Pupils are equal, round, and reactive to light.  Cardiovascular:     Rate and Rhythm: Normal rate and regular rhythm.     Heart sounds: Normal heart sounds. No murmur. No friction rub. No gallop.   Pulmonary:     Effort: Pulmonary effort is normal. No respiratory distress.     Breath sounds: Normal breath sounds. No wheezing.  Abdominal:     General: Bowel sounds are normal. There is no distension.  Palpations: Abdomen is soft.     Tenderness: There is no abdominal tenderness.  Musculoskeletal:     Cervical back: Normal range of motion and neck supple.  Skin:    General: Skin is warm and dry.     Capillary Refill: Capillary refill takes less than 2 seconds.     Findings: No erythema or rash.  Neurological:     Mental Status: She is alert and oriented to person, place, and time.     Motor: No abnormal muscle tone.     Coordination: Coordination normal.  Psychiatric:        Behavior: Behavior normal.     ED Results / Procedures / Treatments   Labs (all labs ordered are listed, but only abnormal results are displayed) Labs Reviewed  COMPREHENSIVE METABOLIC PANEL - Abnormal; Notable for the following components:      Result Value   Potassium 3.3 (*)    Creatinine, Ser 1.95 (*)    Albumin 2.2 (*)    Total Bilirubin 0.1 (*)    GFR calc non Af Amer 24 (*)    GFR calc Af Amer 28 (*)    All other components within normal limits  CBC - Abnormal; Notable for the following components:   RBC 2.82 (*)    Hemoglobin 6.8 (*)    HCT 22.7 (*)    MCH 24.1 (*)    RDW 17.3 (*)    nRBC 0.3 (*)    All other components within normal limits  TYPE AND SCREEN  ABO/RH    EKG None  Radiology No results found.  Procedures Procedures (including critical care  time)  Medications Ordered in ED Medications - No data to display  ED Course  I have reviewed the triage vital signs and the nursing notes.  Pertinent labs & imaging results that were available during my care of the patient were reviewed by me and considered in my medical decision making (see chart for details).    MDM Rules/Calculators/A&P                      Patient will need transfusion and admission to the hospital.  Patient does have symptomatic anemia.  Patient has been fairly stable here in the emergency department.  Patient is advised the plan and all questions were answered. Final Clinical Impression(s) / ED Diagnoses Final diagnoses:  None    Rx / DC Orders ED Discharge Orders    None       Dalia Heading, PA-C 05/17/19 [place], Wenda Overland, MD 05/21/19 [phone]

## 2019-05-17 NOTE — ED Notes (Signed)
Hgb 6.8 reported from lab MD made aware

## 2019-05-18 ENCOUNTER — Encounter (HOSPITAL_COMMUNITY): Payer: Self-pay | Admitting: Internal Medicine

## 2019-05-18 ENCOUNTER — Observation Stay (HOSPITAL_COMMUNITY): Payer: Medicare Other

## 2019-05-18 ENCOUNTER — Telehealth: Payer: Self-pay | Admitting: Pharmacy Technician

## 2019-05-18 DIAGNOSIS — Z87891 Personal history of nicotine dependence: Secondary | ICD-10-CM

## 2019-05-18 DIAGNOSIS — E861 Hypovolemia: Secondary | ICD-10-CM | POA: Diagnosis present

## 2019-05-18 DIAGNOSIS — N179 Acute kidney failure, unspecified: Secondary | ICD-10-CM

## 2019-05-18 DIAGNOSIS — J449 Chronic obstructive pulmonary disease, unspecified: Secondary | ICD-10-CM

## 2019-05-18 DIAGNOSIS — D649 Anemia, unspecified: Secondary | ICD-10-CM

## 2019-05-18 DIAGNOSIS — A319 Mycobacterial infection, unspecified: Secondary | ICD-10-CM | POA: Diagnosis present

## 2019-05-18 DIAGNOSIS — E870 Hyperosmolality and hypernatremia: Secondary | ICD-10-CM | POA: Diagnosis not present

## 2019-05-18 DIAGNOSIS — Z808 Family history of malignant neoplasm of other organs or systems: Secondary | ICD-10-CM | POA: Diagnosis not present

## 2019-05-18 DIAGNOSIS — D6489 Other specified anemias: Secondary | ICD-10-CM | POA: Diagnosis present

## 2019-05-18 DIAGNOSIS — M069 Rheumatoid arthritis, unspecified: Secondary | ICD-10-CM | POA: Diagnosis present

## 2019-05-18 DIAGNOSIS — Z95828 Presence of other vascular implants and grafts: Secondary | ICD-10-CM | POA: Diagnosis not present

## 2019-05-18 DIAGNOSIS — D62 Acute posthemorrhagic anemia: Secondary | ICD-10-CM | POA: Diagnosis present

## 2019-05-18 DIAGNOSIS — E785 Hyperlipidemia, unspecified: Secondary | ICD-10-CM | POA: Diagnosis present

## 2019-05-18 DIAGNOSIS — J069 Acute upper respiratory infection, unspecified: Secondary | ICD-10-CM

## 2019-05-18 DIAGNOSIS — E876 Hypokalemia: Secondary | ICD-10-CM | POA: Diagnosis not present

## 2019-05-18 DIAGNOSIS — A310 Pulmonary mycobacterial infection: Secondary | ICD-10-CM

## 2019-05-18 DIAGNOSIS — J9601 Acute respiratory failure with hypoxia: Secondary | ICD-10-CM | POA: Diagnosis present

## 2019-05-18 DIAGNOSIS — T368X5A Adverse effect of other systemic antibiotics, initial encounter: Secondary | ICD-10-CM | POA: Diagnosis present

## 2019-05-18 DIAGNOSIS — Z888 Allergy status to other drugs, medicaments and biological substances status: Secondary | ICD-10-CM | POA: Diagnosis not present

## 2019-05-18 DIAGNOSIS — J90 Pleural effusion, not elsewhere classified: Secondary | ICD-10-CM | POA: Diagnosis present

## 2019-05-18 DIAGNOSIS — Z885 Allergy status to narcotic agent status: Secondary | ICD-10-CM | POA: Diagnosis not present

## 2019-05-18 DIAGNOSIS — I517 Cardiomegaly: Secondary | ICD-10-CM | POA: Diagnosis not present

## 2019-05-18 DIAGNOSIS — U071 COVID-19: Secondary | ICD-10-CM | POA: Diagnosis present

## 2019-05-18 DIAGNOSIS — G4710 Hypersomnia, unspecified: Secondary | ICD-10-CM | POA: Diagnosis present

## 2019-05-18 DIAGNOSIS — Z681 Body mass index (BMI) 19 or less, adult: Secondary | ICD-10-CM | POA: Diagnosis not present

## 2019-05-18 DIAGNOSIS — Z8051 Family history of malignant neoplasm of kidney: Secondary | ICD-10-CM | POA: Diagnosis not present

## 2019-05-18 DIAGNOSIS — I340 Nonrheumatic mitral (valve) insufficiency: Secondary | ICD-10-CM | POA: Diagnosis not present

## 2019-05-18 DIAGNOSIS — I361 Nonrheumatic tricuspid (valve) insufficiency: Secondary | ICD-10-CM

## 2019-05-18 DIAGNOSIS — M810 Age-related osteoporosis without current pathological fracture: Secondary | ICD-10-CM | POA: Diagnosis present

## 2019-05-18 DIAGNOSIS — F17200 Nicotine dependence, unspecified, uncomplicated: Secondary | ICD-10-CM | POA: Diagnosis present

## 2019-05-18 DIAGNOSIS — R32 Unspecified urinary incontinence: Secondary | ICD-10-CM | POA: Diagnosis present

## 2019-05-18 DIAGNOSIS — E213 Hyperparathyroidism, unspecified: Secondary | ICD-10-CM | POA: Diagnosis present

## 2019-05-18 DIAGNOSIS — L89312 Pressure ulcer of right buttock, stage 2: Secondary | ICD-10-CM | POA: Diagnosis present

## 2019-05-18 DIAGNOSIS — E43 Unspecified severe protein-calorie malnutrition: Secondary | ICD-10-CM | POA: Diagnosis present

## 2019-05-18 LAB — RETICULOCYTES
Immature Retic Fract: 8.6 % (ref 2.3–15.9)
RBC.: 2.48 MIL/uL — ABNORMAL LOW (ref 3.87–5.11)
Retic Count, Absolute: 10.4 10*3/uL — ABNORMAL LOW (ref 19.0–186.0)
Retic Ct Pct: 0.4 % (ref 0.4–3.1)

## 2019-05-18 LAB — BASIC METABOLIC PANEL
Anion gap: 8 (ref 5–15)
BUN: 12 mg/dL (ref 8–23)
CO2: 30 mmol/L (ref 22–32)
Calcium: 8.6 mg/dL — ABNORMAL LOW (ref 8.9–10.3)
Chloride: 102 mmol/L (ref 98–111)
Creatinine, Ser: 1.42 mg/dL — ABNORMAL HIGH (ref 0.44–1.00)
GFR calc Af Amer: 41 mL/min — ABNORMAL LOW (ref >60)
GFR calc non Af Amer: 35 mL/min — ABNORMAL LOW (ref >60)
Glucose, Bld: 68 mg/dL — ABNORMAL LOW (ref 70–99)
Potassium: 3.4 mmol/L — ABNORMAL LOW (ref 3.5–5.1)
Sodium: 140 mmol/L (ref 135–145)

## 2019-05-18 LAB — IRON AND TIBC
Iron: 49 ug/dL (ref 28–170)
Saturation Ratios: 32 % — ABNORMAL HIGH (ref 10.4–31.8)
TIBC: 154 ug/dL — ABNORMAL LOW (ref 250–450)
UIBC: 105 ug/dL

## 2019-05-18 LAB — HEMOGLOBIN AND HEMATOCRIT, BLOOD
HCT: 21.3 % — ABNORMAL LOW (ref 36.0–46.0)
Hemoglobin: 6.6 g/dL — CL (ref 12.0–15.0)

## 2019-05-18 LAB — VITAMIN B12: Vitamin B-12: 328 pg/mL (ref 180–914)

## 2019-05-18 LAB — LACTATE DEHYDROGENASE: LDH: 123 U/L (ref 98–192)

## 2019-05-18 LAB — URINALYSIS, ROUTINE W REFLEX MICROSCOPIC
Bacteria, UA: NONE SEEN
Bilirubin Urine: NEGATIVE
Glucose, UA: NEGATIVE mg/dL
Hgb urine dipstick: NEGATIVE
Ketones, ur: NEGATIVE mg/dL
Leukocytes,Ua: NEGATIVE
Nitrite: NEGATIVE
Protein, ur: 30 mg/dL — AB
Specific Gravity, Urine: 1.017 (ref 1.005–1.030)
pH: 5 (ref 5.0–8.0)

## 2019-05-18 LAB — RESPIRATORY PANEL BY PCR

## 2019-05-18 LAB — PREPARE RBC (CROSSMATCH)

## 2019-05-18 LAB — D-DIMER, QUANTITATIVE: D-Dimer, Quant: 1.13 ug/mL-FEU — ABNORMAL HIGH (ref 0.00–0.50)

## 2019-05-18 LAB — FERRITIN: Ferritin: 219 ng/mL (ref 11–307)

## 2019-05-18 LAB — ECHOCARDIOGRAM COMPLETE: Weight: 1664.91 oz

## 2019-05-18 LAB — FIBRINOGEN: Fibrinogen: 231 mg/dL (ref 210–475)

## 2019-05-18 LAB — FOLATE: Folate: 9.6 ng/mL (ref >5.9)

## 2019-05-18 LAB — SARS CORONAVIRUS 2 (TAT 6-24 HRS): SARS Coronavirus 2: POSITIVE — AB

## 2019-05-18 LAB — C-REACTIVE PROTEIN: CRP: 3.3 mg/dL — ABNORMAL HIGH (ref <1.0)

## 2019-05-18 MED ORDER — ASCORBIC ACID 500 MG PO TABS
500.0000 mg | ORAL_TABLET | Freq: Two times a day (BID) | ORAL | Status: DC
  Administered 2019-05-18 – 2019-05-22 (×9): 500 mg via ORAL
  Filled 2019-05-18 (×10): qty 1

## 2019-05-18 MED ORDER — GUAIFENESIN-DM 100-10 MG/5ML PO SYRP
5.0000 mL | ORAL_SOLUTION | ORAL | Status: DC | PRN
  Administered 2019-05-20 (×2): 5 mL via ORAL
  Filled 2019-05-18 (×2): qty 10

## 2019-05-18 MED ORDER — ALBUTEROL SULFATE HFA 108 (90 BASE) MCG/ACT IN AERS
2.0000 | INHALATION_SPRAY | RESPIRATORY_TRACT | Status: DC | PRN
  Administered 2019-05-18 – 2019-05-22 (×6): 2 via RESPIRATORY_TRACT
  Filled 2019-05-18: qty 6.7

## 2019-05-18 MED ORDER — ZINC SULFATE 220 (50 ZN) MG PO CAPS
220.0000 mg | ORAL_CAPSULE | Freq: Every day | ORAL | Status: DC
  Administered 2019-05-18 – 2019-05-22 (×5): 220 mg via ORAL
  Filled 2019-05-18 (×5): qty 1

## 2019-05-18 MED ORDER — SODIUM CHLORIDE 0.9 % IV SOLN
100.0000 mg | Freq: Once | INTRAVENOUS | Status: AC
  Administered 2019-05-18: 100 mg via INTRAVENOUS
  Filled 2019-05-18: qty 100

## 2019-05-18 MED ORDER — SODIUM CHLORIDE 0.9% IV SOLUTION: Freq: Once | INTRAVENOUS | Status: AC

## 2019-05-18 MED ORDER — POTASSIUM CHLORIDE CRYS ER 20 MEQ PO TBCR
40.0000 meq | EXTENDED_RELEASE_TABLET | Freq: Once | ORAL | Status: AC
  Administered 2019-05-18: 40 meq via ORAL
  Filled 2019-05-18: qty 2

## 2019-05-18 MED ORDER — SODIUM CHLORIDE 0.9 % IV SOLN
100.0000 mg | Freq: Every day | INTRAVENOUS | Status: AC
  Administered 2019-05-18 – 2019-05-22 (×5): 100 mg via INTRAVENOUS
  Filled 2019-05-18: qty 100
  Filled 2019-05-18 (×4): qty 20

## 2019-05-18 MED ORDER — DEXAMETHASONE SODIUM PHOSPHATE 10 MG/ML IJ SOLN
6.0000 mg | Freq: Every day | INTRAMUSCULAR | Status: DC
  Administered 2019-05-18 – 2019-05-22 (×5): 6 mg via INTRAVENOUS
  Filled 2019-05-18 (×5): qty 1

## 2019-05-18 MED ORDER — GUAIFENESIN ER 600 MG PO TB12
1200.0000 mg | ORAL_TABLET | Freq: Two times a day (BID) | ORAL | Status: DC
  Administered 2019-05-18 – 2019-05-22 (×9): 1200 mg via ORAL
  Filled 2019-05-18 (×11): qty 2

## 2019-05-18 MED ORDER — IPRATROPIUM-ALBUTEROL 20-100 MCG/ACT IN AERS
2.0000 | INHALATION_SPRAY | Freq: Four times a day (QID) | RESPIRATORY_TRACT | Status: DC
  Administered 2019-05-18 – 2019-05-22 (×16): 2 via RESPIRATORY_TRACT
  Filled 2019-05-18: qty 4

## 2019-05-18 NOTE — Progress Notes (Signed)
[facility]                                                                              Patient Demographics  Olivia Hooper, is a 79 y.o. female, DOB - January 20, 1941, [id number]  Admit date - 05/17/2019   Admitting Physician Reubin Milan, MD  Outpatient Primary MD for the patient is [name], MD  Outpatient specialists:   LOS - 0  days   Medical records reviewed and are as summarized below:  Chief complaint: Abnormal lab results       Brief summary   Patient is a 79 year old female with history of anemia, carpal tunnel syndrome, COPD, drug-induced anemia, dyspnea, hypercalcemia, hyperlipidemia hyperparathyroidism, osteoporosis, rheumatoid arthritis, pulmonary mycobacterial infection, followed by Dr. Drucilla Schmidt, vitamin D deficiency presented to ED after she was told by her PCP that her hemoglobin was low.  Patient had reported that she was dyspneic, decreased appetite, had been craving water.  She also had bilateral lower and upper extremity edema and hypersomnia.  She reported some dizziness and palpitations but no chest pain.  Denied any fevers or chills, sore throat abdominal pain nausea hematemesis hematochezia or melena.  Denied any dysuria or hematuria. In ED, hemoglobin was 6.8, platelets 222 Chest x-ray showed small bilateral pleural effusions, new from prior exam, cavitary lesion is decreasing in size COVID-19 test returned positive this morning  Assessment & Plan    Principal Problem:   Acute respiratory disease due to COVID-19 virus -Patient presented with symptoms of shortness of breath, decreased appetite, dizziness, palpitations attributed to symptomatic anemia.  Incidentally also found to have COVID-19 today - Chest x-ray on 3/10 showed small bilateral pleural effusions, new, known cavitary right upper lobe lesion has decreased - Currently O2 sats 93% on 2 L, not on O2 at home  -Started on Decadron 6 mg IV daily, Remdesivir per pharmacy  protocol - Continue Supportive care: vitamin C/zinc, albuterol, Tylenol. - Continue to wean oxygen, ambulatory O2 screening daily as tolerated  - Oxygen - SpO2: 93 % O2 Flow Rate (L/min): 2 L/min - Continue to follow labs as below  Lab Results  Component Value Date   SARSCOV2NAA POSITIVE (A) 05/18/2019   SARSCOV2NAA NOT DETECTED 01/14/2019     Recent Labs  Lab 05/17/19 1412 05/18/19 [id number]  FERRITIN  --  219  ALT 14  --    Active Problems:  Symptomatic anemia -Possibly due to Zyvox or COVID-19.  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 Patient denies any GI bleeding -Hemoglobin 6.8 at the time of admission.  Received 1 unit packed RBC transfusion overnight, hemoglobin 6.6.  Transfuse 2 units packed RBCs.  -Zyvox discontinued, placed on cefoxitin -Hematology consulted, discussed with Dr. Burr Medico   Pulmonary mycobacterial infection [facility]) -Zyvox discontinued, placed on IV cefoxitin -ID consulted, discussed with Dr.Comer.  Given new antibiotic, will need duration, dosing for home health  Acute on COPD GOLD II / still smoking  -Currently wheezing, patient counseled on smoking cessation -Placed on scheduled Combivent inhaler, albuterol  inhaler as needed, Dulera -Continue Mucinex, Robitussin, flutter valve    Rheumatoid arthritis (HCC) -Continue pain control as needed     AKI (acute kidney injury) ([place]) -Baseline creatinine 0.7, presented with creatinine of 1.95 at the time of admission -Likely due to acute anemia, hypoperfusion, also on torsemide -Holding off torsemide today, continue packed RBC transfusion -Creatinine function improving, 1.4 today     Cardiomegaly, bilateral pleural effusions - Elevated BNP 339.6, no prior echo, may have underlying CHF - Obtain 2D echocardiogram for further work-up. - Holding torsemide  today due to AKI, will restart after the transfusions   Severe protein calorie malnutrition Estimated body mass index is 19.34 kg/m as calculated from the following:   Height as of 05/03/19: 5' 1.5" (1.562 m).   Weight as of this encounter: 47.2 kg.  Code Status: Full CODE STATUS DVT Prophylaxis:  SCD's Family Communication: Discussed all imaging results, lab results, explained to the patient and daughter on the phone   Disposition Plan: Patient from home.  Anticipate discharge home once hemoglobin stable, hypoxia improving.  COVID-19 positive today, starting on IV Decadron and remdesivir.  Also needs hematology work-up and ID recommendations regarding antibiotics.   Time Spent in minutes 35 minutes  Procedures:  None  Consultants:   Infectious disease, Dr. Linus Salmons Hematology, Dr. Burr Medico  Antimicrobials:   Anti-infectives (From admission, onward)   Start     Dose/Rate Route Frequency Ordered Stop   05/18/19 0800  cefOXitin (MEFOXIN) 8 g in sodium chloride 0.9 % 210 mL continuous infusion  Status:  Discontinued     8 g 12.5 mL/hr over 20 Hours Intravenous Every 24 hours 05/17/19 1629 05/17/19 1703   05/17/19 1800  cefOXitin (MEFOXIN) 2 g in sodium chloride 0.9 % 100 mL IVPB     2 g 200 mL/hr over 30 Minutes Intravenous Every 12 hours 05/17/19 1707     05/17/19 1630  cefOXitin (MEFOXIN) 2 g in sodium chloride 0.9 % 100 mL IVPB  Status:  Discontinued     2 g 200 mL/hr over 30 Minutes Intravenous  Once 05/17/19 1629 05/17/19 1630         Medications  Scheduled Meds: . Chlorhexidine Gluconate Cloth  6 each Topical Daily  . dexamethasone (DECADRON) injection  6 mg Intravenous Daily  . guaiFENesin  1,200 mg Oral BID  . Ipratropium-Albuterol  2 puff Inhalation Q6H  . mometasone-formoterol  2 puff Inhalation BID   Continuous Infusions: . sodium chloride    . cefOXitin 2 g (05/17/19 2339)   PRN Meds:.acetaminophen **OR** acetaminophen, albuterol,  guaiFENesin-dextromethorphan, lidocaine-prilocaine, ondansetron **OR** ondansetron (ZOFRAN) IV, polyvinyl alcohol, sodium chloride flush, traMADol      Subjective:   Auroara Hutsell was seen and examined today.  Wheezing at the time of my examination, coughing, shortness of breath.  Patient denies dizziness, abdominal pain, N/V/D/C, new weakness, numbess, tingling. No acute events overnight.    Objective:   Vitals:   05/17/19 2052 05/17/19 2328 05/18/19 0543 05/18/19 1006  BP: (!) 104/46 (!) 109/45 (!) 100/49 (!) 112/41  Pulse: 75 73 77 84  Resp: 16 16 19 20   Temp: 98.6 F (37 C) 99.1 F (37.3 C) 99.7 F (37.6 C) 99.3 F (37.4 C)  TempSrc: Oral Oral Oral Oral  SpO2: 100% 100% 99% 93%  Weight:        Intake/Output Summary (Last 24 hours) at 05/18/2019 1016 Last data filed at 05/18/2019 0400 Gross per 24 hour  Intake 739.77 ml  Output --  Net 739.77 ml     Wt Readings from Last 3 Encounters:  05/17/19 47.2 kg  05/03/19 47.2 kg  04/11/19 43.1 kg     Exam  General: Alert and oriented x 3, NAD  Cardiovascular: S1 S2 auscultated, no murmurs, RRR  Respiratory: Bilateral expiratory wheezing  Gastrointestinal: Soft, nontender, nondistended, + bowel sounds  Ext: no pedal edema bilaterally  Neuro: No new deficits  Musculoskeletal: No digital cyanosis, clubbing  Skin: No rashes  Psych: Normal affect and demeanor, alert and oriented x3    Data Reviewed:  I have personally reviewed following labs and imaging studies  Micro Results Recent Results (from the past 240 hour(s))  SARS CORONAVIRUS 2 (TAT 6-24 HRS) Nasopharyngeal Nasopharyngeal Swab     Status: Abnormal   Collection Time: 05/18/19 12:09 AM   Specimen: Nasopharyngeal Swab  Result Value Ref Range Status   SARS Coronavirus 2 POSITIVE (A) NEGATIVE Final    Comment: RESULT CALLED TO, READ BACK BY AND VERIFIED WITH: Lafonda Mosses RN 8:55 05/18/19 ([email or web address]) (NOTE) SARS-CoV-2 target nucleic acids are  DETECTED. The SARS-CoV-2 RNA is generally detectable in upper and lower respiratory specimens during the acute phase of infection. Positive results are indicative of the presence of SARS-CoV-2 RNA. Clinical correlation with patient history and other diagnostic information is  necessary to determine patient infection status. Positive results do not rule out bacterial infection or co-infection with other viruses.  The expected result is Negative. Fact Sheet for Patients: SugarRoll.be Fact Sheet for Healthcare Providers: https://www.woods-mathews.com/ This test is not yet approved or cleared by the [place] FDA and  has been authorized for detection and/or diagnosis of SARS-CoV-2 by FDA under an Emergency Use Authorization (EUA). This EUA will remain  in effect (meaning this test can be used) for the  duration of the COVID-19 declaration under Section 564(b)(1) of the Act, 21 U.S.C. section 360bbb-3(b)(1), unless the authorization is terminated or revoked sooner. Performed at [facility], [place] [address], [place], [place] [address]   Respiratory Panel by PCR     Status: None   Collection Time: 05/18/19 12:09 AM   Specimen: Nasopharyngeal Swab; Respiratory  Result Value Ref Range Status   Adenovirus NOT DETECTED NOT DETECTED Final   Coronavirus 229E NOT DETECTED NOT DETECTED Final    Comment: (NOTE) The Coronavirus on the Respiratory Panel, DOES NOT test for the novel  Coronavirus (2019 nCoV)    Coronavirus HKU1 NOT DETECTED NOT DETECTED Final   Coronavirus NL63 NOT DETECTED NOT DETECTED Final   Coronavirus OC43 NOT DETECTED NOT DETECTED Final   Metapneumovirus NOT DETECTED NOT DETECTED Final   Rhinovirus / Enterovirus NOT DETECTED NOT DETECTED Final   Influenza A NOT DETECTED NOT DETECTED Final   Influenza B NOT DETECTED NOT DETECTED Final   Parainfluenza Virus 1 NOT DETECTED NOT DETECTED Final   Parainfluenza Virus 2 NOT  DETECTED NOT DETECTED Final   Parainfluenza Virus 3 NOT DETECTED NOT DETECTED Final   Parainfluenza Virus 4 NOT DETECTED NOT DETECTED Final   Respiratory Syncytial Virus NOT DETECTED NOT DETECTED Final   Bordetella pertussis NOT DETECTED NOT DETECTED Final   Chlamydophila pneumoniae NOT DETECTED NOT DETECTED Final   Mycoplasma pneumoniae NOT DETECTED NOT DETECTED Final    Comment: Performed at [facility], [place]. [address] [place], [place] [address]    Radiology Reports DG Chest 2 View  Result Date: 05/17/2019 CLINICAL DATA:  Shortness of breath for 1 week. Symptomatic anemia. Weakness and dizziness. EXAM:  CHEST - 2 VIEW COMPARISON:  CT 04/10/2019, radiograph 01/18/2019 FINDINGS: Small bilateral pleural effusions, new from prior exam. Normal heart size with aortic atherosclerosis. Right chest port with tip in the lower SVC. Known cavitary right upper lobe lesion has decreased soft tissue component compared to November radiograph. Additional Tree-in-bud opacities on CT are not well visualized radiographically. No pulmonary edema. Chronic compression fracture of the midthoracic spine. Bones are under mineralized. Surgical clips in the left axilla. Splenic granuloma in the upper abdomen incidentally noted. IMPRESSION: 1. Small bilateral pleural effusions, new from prior exam. 2. Known cavitary right upper lobe lesion has decreased soft tissue component compared to November radiograph, similar to CT last month. No new airspace disease. Aortic Atherosclerosis (ICD10-I70.0). Electronically Signed   By: Keith Rake M.D.   On: 05/17/2019 17:10    Lab Data:  CBC: Recent Labs  Lab 05/17/19 1412 05/18/19 0632  WBC 6.4  --   HGB 6.8* 6.6*  HCT 22.7* 21.3*  MCV 80.5  --   PLT 222  --    Basic Metabolic Panel: Recent Labs  Lab 05/17/19 1412 05/18/19 0632  NA 140 140  K 3.3* 3.4*  CL 100 102  CO2 30 30  GLUCOSE 93 68*  BUN 16 12  CREATININE 1.95* 1.42*  CALCIUM 9.5 8.6*    GFR: Estimated Creatinine Clearance: 23.9 mL/min (A) (by C-G formula based on SCr of 1.42 mg/dL (H)). Liver Function Tests: Recent Labs  Lab 05/17/19 1412  AST 26  ALT 14  ALKPHOS 112  BILITOT 0.1*  PROT 6.6  ALBUMIN 2.2*   No results for input(s): LIPASE, AMYLASE in the last 168 hours. No results for input(s): AMMONIA in the last 168 hours. Coagulation Profile: No results for input(s): INR, PROTIME in the last 168 hours. Cardiac Enzymes: No results for input(s): CKTOTAL, CKMB, CKMBINDEX, TROPONINI in the last 168 hours. BNP (last 3 results) No results for input(s): PROBNP in the last 8760 hours. HbA1C: No results for input(s): HGBA1C in the last 72 hours. CBG: No results for input(s): GLUCAP in the last 168 hours. Lipid Profile: No results for input(s): CHOL, HDL, LDLCALC, TRIG, CHOLHDL, LDLDIRECT in the last 72 hours. Thyroid Function Tests: No results for input(s): TSH, T4TOTAL, FREET4, T3FREE, THYROIDAB in the last 72 hours. Anemia Panel: Recent Labs    05/18/19 0632  VITAMINB12 328  FOLATE 9.6  FERRITIN 219  TIBC 154*  IRON 49  RETICCTPCT 0.4   Urine analysis: No results found for: COLORURINE, APPEARANCEUR, LABSPEC, PHURINE, GLUCOSEU, HGBUR, BILIRUBINUR, KETONESUR, PROTEINUR, UROBILINOGEN, NITRITE, LEUKOCYTESUR   Sirenity Shew M.D. [facility] 05/18/2019, 10:16 AM   Call night coverage person covering after 7pm

## 2019-05-18 NOTE — Consult Note (Signed)
[name]       Reason for Consult: M abscessus infection    Referring Physician: Dr. Tana Coast  Principal Problem:   Acute respiratory disease due to COVID-19 virus Active Problems:   COPD GOLD II / still smoking    Rheumatoid arthritis ([place])   Pulmonary mycobacterial infection ([place])   Symptomatic anemia   AKI (acute kidney injury) ([place])   Cardiomegaly   . vitamin C  500 mg Oral BID  . Chlorhexidine Gluconate Cloth  6 each Topical Daily  . dexamethasone (DECADRON) injection  6 mg Intravenous Daily  . guaiFENesin  1,200 mg Oral BID  . Ipratropium-Albuterol  2 puff Inhalation Q6H  . mometasone-formoterol  2 puff Inhalation BID  . zinc sulfate  220 mg Oral Daily    Recommendations: Hold antibiotics for M abscessus Will try to get coverage of other options, may not be until next week (we can change as an outpatient if she is discharged first)  Assessment: She has M abscessus and new/worsening anemia.  No obvious reason for the anemia and linezolid is of concern though seems very unusual without some thrombocytopenia, though platelets have decreased some from her baseline.  I do though think it prudent with her anemia to consider other options, though they are limited for her.  Certainly we can continue with the cefoxitin and clofazimine but will need a third agent such as omadacycline, which will require prior authorization.  In the meantime, it is better to hold on all drugs until we have three to avoid resistance, particular with the macrolide resistance she already has.    Antibiotics: Cefoxitin, linezolid,   HPI: Olivia Hooper is a 79 y.o. female with history of COPD, RA and recent diagnosis of M abscessus pulmonary infection on treatment who presented with known decrease in Hgb.  No obvious signs of blood loss, no known hemolysis.  Concern for linezolid toxicity. Also now with COVID positive test.  She endorses more SOB over the last week.  No  fever.  New pleural effusions on CXR.  Has a port in place for antibiotics.     Review of Systems:  Constitutional: negative for fevers and chills Gastrointestinal: negative for diarrhea Integument/breast: negative for rash All other systems reviewed and are negative    Past Medical History:  Diagnosis Date  . Anemia 05/03/2019  . Breast cancer ([place])    on left  . Carpal tunnel syndrome   . COPD (chronic obstructive pulmonary disease) ([place])   . Drug-induced anemia 05/03/2019  . Dyspnea   . Hypercalcemia   . Hyperlipidemia   . Hyperparathyroidism ([place])   . Osteoporosis   . Pneumonia   . Pulmonary mycobacterial infection ([place]) 03/14/2019  . RA (rheumatoid arthritis) ([place])   . Sleeping excessive 05/03/2019  . Vertigo   . Vitamin D deficiency     Social History   Tobacco Use  . Smoking status: Former Smoker    Packs/day: 0.25    Years: 62.00    Pack years: 15.50    Types: Cigarettes    Quit date: 01/12/2019    Years since quitting: 0.3  . Smokeless tobacco: Never Used  Substance Use Topics  . Alcohol use: No    Alcohol/week: 0.0 standard drinks  . Drug use: No    Family History  Problem Relation Age of Onset  . Heart disease Father   . Bone cancer Father   . Emphysema Father  never smoker  . Heart disease Mother   . Colon cancer Mother   . Melanoma Sister   . Kidney cancer Brother     Allergies  Allergen Reactions  . Buprenorphine Hcl Nausea And Vomiting  . Morphine And Related Nausea And Vomiting         Physical Exam: Constitutional: in no apparent distress  Vitals:   05/18/19 1041 05/18/19 1238  BP: (!) 122/49 (!) 113/53  Pulse: 90 89  Resp: 20 20  Temp: 98.1 F (36.7 C) 98.5 F (36.9 C)  SpO2: 95% 94%   EYES: anicteric Cardiovascular: Cor RRR Respiratory: clear; GI: soft Musculoskeletal: no pedal edema noted Skin: negatives: no rash Neuro: non-focal  Lab Results  Component Value Date   WBC 6.4 05/17/2019   HGB 6.6 (LL)  05/18/2019   HCT 21.3 (L) 05/18/2019   MCV 80.5 05/17/2019   PLT 222 05/17/2019    Lab Results  Component Value Date   CREATININE 1.42 (H) 05/18/2019   BUN 12 05/18/2019   NA 140 05/18/2019   K 3.4 (L) 05/18/2019   CL 102 05/18/2019   CO2 30 05/18/2019    Lab Results  Component Value Date   ALT 14 05/17/2019   AST 26 05/17/2019   ALKPHOS 112 05/17/2019     Microbiology: Recent Results (from the past 240 hour(s))  SARS CORONAVIRUS 2 (TAT 6-24 HRS) Nasopharyngeal Nasopharyngeal Swab     Status: Abnormal   Collection Time: 05/18/19 12:09 AM   Specimen: Nasopharyngeal Swab  Result Value Ref Range Status   SARS Coronavirus 2 POSITIVE (A) NEGATIVE Final    Comment: RESULT CALLED TO, READ BACK BY AND VERIFIED WITH: Lafonda Mosses RN 8:55 05/18/19 ([email or web address]) (NOTE) SARS-CoV-2 target nucleic acids are DETECTED. The SARS-CoV-2 RNA is generally detectable in upper and lower respiratory specimens during the acute phase of infection. Positive results are indicative of the presence of SARS-CoV-2 RNA. Clinical correlation with patient history and other diagnostic information is  necessary to determine patient infection status. Positive results do not rule out bacterial infection or co-infection with other viruses.  The expected result is Negative. Fact Sheet for Patients: SugarRoll.be Fact Sheet for Healthcare Providers: https://www.woods-mathews.com/ This test is not yet approved or cleared by the [place] FDA and  has been authorized for detection and/or diagnosis of SARS-CoV-2 by FDA under an Emergency Use Authorization (EUA). This EUA will remain  in effect (meaning this test can be used) for the  duration of the COVID-19 declaration under Section 564(b)(1) of the Act, 21 U.S.C. section 360bbb-3(b)(1), unless the authorization is terminated or revoked sooner. Performed at [facility], [place] [address], [place], [place] [address]     Respiratory Panel by PCR     Status: None   Collection Time: 05/18/19 12:09 AM   Specimen: Nasopharyngeal Swab; Respiratory  Result Value Ref Range Status   Adenovirus NOT DETECTED NOT DETECTED Final   Coronavirus 229E NOT DETECTED NOT DETECTED Final    Comment: (NOTE) The Coronavirus on the Respiratory Panel, DOES NOT test for the novel  Coronavirus (2019 nCoV)    Coronavirus HKU1 NOT DETECTED NOT DETECTED Final   Coronavirus NL63 NOT DETECTED NOT DETECTED Final   Coronavirus OC43 NOT DETECTED NOT DETECTED Final   Metapneumovirus NOT DETECTED NOT DETECTED Final   Rhinovirus / Enterovirus NOT DETECTED NOT DETECTED Final   Influenza A NOT DETECTED NOT DETECTED Final   Influenza B NOT DETECTED NOT DETECTED Final   Parainfluenza Virus 1  NOT DETECTED NOT DETECTED Final   Parainfluenza Virus 2 NOT DETECTED NOT DETECTED Final   Parainfluenza Virus 3 NOT DETECTED NOT DETECTED Final   Parainfluenza Virus 4 NOT DETECTED NOT DETECTED Final   Respiratory Syncytial Virus NOT DETECTED NOT DETECTED Final   Bordetella pertussis NOT DETECTED NOT DETECTED Final   Chlamydophila pneumoniae NOT DETECTED NOT DETECTED Final   Mycoplasma pneumoniae NOT DETECTED NOT DETECTED Final    Comment: Performed at [facility], [place] [address], [place],  [address]    Lameeka Schleifer W Latisia Hilaire, [place] [facility] [email or web address] 05/18/2019, 2:03 PM

## 2019-05-18 NOTE — Telephone Encounter (Signed)
[facility] Patient Advocate Encounter   Received notification from [facility] that prior authorization for Olivia Hooper is required.   PA submitted on 05/18/2019 Key [id number] Status is Approved until 05/17/2020    [facility] will continue to follow. The bolus dose of 450mg  might need to be adjusted in billing but once daily approved for a year.  Bartholomew Crews, CPhT Specialty Pharmacy Patient [facility] for [facility] Phone: [phone] Fax: [phone] 05/18/2019 3:31 PM

## 2019-05-18 NOTE — Consult Note (Addendum)
[place]  [phone] [phone]    [place]  Referring MD:  Dr. Estill Cotta  Reason for Referral: Anemia  HPI: Ms. Keisler is a 79 year old female with a past medical history significant for anemia, left breast cancer, carpal tunnel syndrome, COPD, hyperlipidemia, hyperparathyroidism, osteoporosis, rheumatoid arthritis, history of pulmonary mycobacterial infection.  The patient was sent to the emergency room by her physician because of a low hemoglobin level.  The patient had been more dyspneic with decreased appetite.  In the ER, she was afebrile and O2 sats were 91% on room air.  Her WBC was 6.4, hemoglobin 6.8, platelet count 222,000.  Creatinine was 1.95.  Chest x-ray showed small bilateral pleural effusions which were new compared to the prior exam and a cavitary lesion which was decreasing in size.  The patient is being followed closely by infectious disease and has been on Zyvox for her mycobacterial infection.  At her last visit, she was noted to have more fatigue which was thought to be caused by drug-induced anemia from the Zyvox.  She was offered a transfusion at that office visit but she declined.  She is currently on cefoxitin.  Of note, the patient tested positive for COVID-19 on admission and has been started on dexamethasone 6 mg daily.  Her ferritin level was normal at 219, folate normal at 9.6, TIBC elevated at 154 and percent saturation elevated at 32%, LDH was normal, and absolute reticulocyte count was low at 10.4, vitamin B12 levels normal at 328.  Haptoglobin pending.  Stool for occult blood has been ordered and is pending.  She has received 1 unit PRBC so far with additional unit currently transfusing.  Repeat hemoglobin performed this morning after 1 unit was 6.6.  Her most recent CBC available to me was performed on 04/05/2019 and on that date, her hemoglobin was 10.0.  Hematology was asked to see the patient to  make recommendations regarding her anemia.  Past Medical History:  Diagnosis Date  . Anemia 05/03/2019  . Breast cancer ([place])    on left  . Carpal tunnel syndrome   . COPD (chronic obstructive pulmonary disease) ([place])   . Drug-induced anemia 05/03/2019  . Dyspnea   . Hypercalcemia   . Hyperlipidemia   . Hyperparathyroidism ([place])   . Osteoporosis   . Pneumonia   . Pulmonary mycobacterial infection ([place]) 03/14/2019  . RA (rheumatoid arthritis) ([place])   . Sleeping excessive 05/03/2019  . Vertigo   . Vitamin D deficiency   :    Past Surgical History:  Procedure Laterality Date  . CARPAL TUNNEL RELEASE    . CATARACT EXTRACTION    . IR IMAGING GUIDED PORT INSERTION  04/05/2019  . MASTECTOMY Left   . VESICOVAGINAL FISTULA CLOSURE W/ TAH    . VIDEO BRONCHOSCOPY WITH ENDOBRONCHIAL ULTRASOUND N/A 01/18/2019   Procedure: VIDEO BRONCHOSCOPY WITH ENDOBRONCHIAL ULTRASOUND;  Surgeon: Candee Furbish, MD;  Location: [facility];  Service: Thoracic;  Laterality: N/A;  :   CURRENT MEDS: Current Facility-Administered Medications  Medication Dose Route Frequency Provider Last Rate Last Admin  . 0.9 %  sodium chloride infusion  10 mL/hr Intravenous Once Reubin Milan, MD      . acetaminophen (TYLENOL) tablet 650 mg  650 mg Oral Q6H PRN Reubin Milan, MD       Or  . acetaminophen (TYLENOL) suppository 650 mg  650 mg Rectal Q6H PRN Reubin Milan, MD      .  albuterol (VENTOLIN HFA) 108 (90 Base) MCG/ACT inhaler 2 puff  2 puff Inhalation Q4H PRN Rai, Ripudeep K, MD      . ascorbic acid (VITAMIN C) tablet 500 mg  500 mg Oral BID Rai, Ripudeep K, MD      . cefOXitin (MEFOXIN) 2 g in sodium chloride 0.9 % 100 mL IVPB  2 g Intravenous Q12H Adrian Saran, RPH 200 mL/hr at 05/17/19 2339 2 g at 05/17/19 2339  . Chlorhexidine Gluconate Cloth 2 % PADS 6 each  6 each Topical Daily Reubin Milan, MD      . dexamethasone (DECADRON) injection 6 mg  6 mg Intravenous Daily Rai, Ripudeep K, MD       . guaiFENesin (MUCINEX) 12 hr tablet 1,200 mg  1,200 mg Oral BID Rai, Ripudeep K, MD      . guaiFENesin-dextromethorphan (ROBITUSSIN DM) 100-10 MG/5ML syrup 5 mL  5 mL Oral Q4H PRN Rai, Ripudeep K, MD      . Ipratropium-Albuterol (COMBIVENT) respimat 2 puff  2 puff Inhalation Q6H Rai, Ripudeep K, MD      . lidocaine-prilocaine (EMLA) cream 1 application  1 application Topical PRN Reubin Milan, MD      . mometasone-formoterol [facility]) 100-5 MCG/ACT inhaler 2 puff  2 puff Inhalation BID Reubin Milan, MD   2 puff at 05/18/19 1008  . ondansetron (ZOFRAN) tablet 4 mg  4 mg Oral Q6H PRN Reubin Milan, MD       Or  . ondansetron [facility]) injection 4 mg  4 mg Intravenous Q6H PRN Reubin Milan, MD      . polyvinyl alcohol (LIQUIFILM TEARS) 1.4 % ophthalmic solution 1 drop  1 drop Both Eyes PRN Reubin Milan, MD      . sodium chloride flush (NS) 0.9 % injection 10-40 mL  10-40 mL Intracatheter PRN Reubin Milan, MD      . traMADol Veat[facility]bon) tablet 50 mg  50 mg Oral BID PRN Reubin Milan, MD      . zinc sulfate capsule 220 mg  220 mg Oral Daily Rai, Ripudeep K, MD          Allergies  Allergen Reactions  . Buprenorphine Hcl Nausea And Vomiting  . Morphine And Related Nausea And Vomiting       :  Family History  Problem Relation Age of Onset  . Heart disease Father   . Bone cancer Father   . Emphysema Father        never smoker  . Heart disease Mother   . Colon cancer Mother   . Melanoma Sister   . Kidney cancer Brother   :  Social History   Socioeconomic History  . Marital status: Widowed    Spouse name: Not on file  . Number of children: Not on file  . Years of education: Not on file  . Highest education level: Not on file  Occupational History  . Occupation: Retired   Tobacco Use  . Smoking status: Former Smoker    Packs/day: 0.25    Years: 62.00    Pack years: 15.50    Types: Cigarettes    Quit date: 01/12/2019    Years since  quitting: 0.3  . Smokeless tobacco: Never Used  Substance and Sexual Activity  . Alcohol use: No    Alcohol/week: 0.0 standard drinks  . Drug use: No  . Sexual activity: Not on file  Other Topics Concern  . Not on file  Social History Narrative  . Not on file   Social Determinants of Health   Financial Resource Strain:   . Difficulty of Paying Living Expenses:   Food Insecurity:   . Worried About Charity fundraiser in the Last Year:   . Arboriculturist in the Last Year:   Transportation Needs:   . Film/video editor (Medical):   Marland Kitchen Lack of Transportation (Non-Medical):   Physical Activity:   . Days of Exercise per Week:   . Minutes of Exercise per Session:   Stress:   . Feeling of Stress :   Social Connections:   . Frequency of Communication with Friends and Family:   . Frequency of Social Gatherings with Friends and Family:   . Attends Religious Services:   . Active Member of Clubs or Organizations:   . Attends Archivist Meetings:   Marland Kitchen Marital Status:   Intimate Partner Violence:   . Fear of Current or Ex-Partner:   . Emotionally Abused:   Marland Kitchen Physically Abused:   . Sexually Abused:   :  REVIEW OF SYSTEMS: As noted in the HPI  Exam: Patient Vitals for the past 24 hrs:  BP Temp Temp src Pulse Resp SpO2 Weight  05/18/19 1006 (!) 112/41 99.3 F (37.4 C) Oral 84 20 93 % --  05/18/19 0543 (!) 100/49 99.7 F (37.6 C) Oral 77 19 99 % --  05/17/19 2328 (!) 109/45 99.1 F (37.3 C) Oral 73 16 100 % --  05/17/19 2052 (!) 104/46 98.6 F (37 C) Oral 75 16 100 % --  05/17/19 2027 (!) 112/41 98.1 F (36.7 C) Oral 75 16 98 % --  05/17/19 2000 (!) 107/53 98.2 F (36.8 C) Oral 88 13 91 % --  05/17/19 1814 -- -- -- -- -- 98 % --  05/17/19 1600 -- -- -- -- -- -- 47.2 kg  05/17/19 1519 135/60 -- -- 87 16 100 % --  05/17/19 1500 -- -- -- -- 16 99 % --  05/17/19 1445 (!) 119/55 -- -- 71 -- 100 % --  05/17/19 1407 (!) 134/53 -- -- 83 16 100 % --  05/17/19 1258 (!)  118/51 98.5 F (36.9 C) Oral 88 20 91 % --    Due to the COVID-19 pandemic and the patient's COVID-19 positive status, will defer exam.  Exam per hospitalist..  LABS:  Lab Results  Component Value Date   WBC 6.4 05/17/2019   HGB 6.6 (LL) 05/18/2019   HCT 21.3 (L) 05/18/2019   PLT 222 05/17/2019   GLUCOSE 68 (L) 05/18/2019   ALT 14 05/17/2019   AST 26 05/17/2019   NA 140 05/18/2019   K 3.4 (L) 05/18/2019   CL 102 05/18/2019   CREATININE 1.42 (H) 05/18/2019   BUN 12 05/18/2019   CO2 30 05/18/2019   INR 1.0 01/18/2019    DG Chest 2 View  Result Date: 05/17/2019 CLINICAL DATA:  Shortness of breath for 1 week. Symptomatic anemia. Weakness and dizziness. EXAM: CHEST - 2 VIEW COMPARISON:  CT 04/10/2019, radiograph 01/18/2019 FINDINGS: Small bilateral pleural effusions, new from prior exam. Normal heart size with aortic atherosclerosis. Right chest port with tip in the lower SVC. Known cavitary right upper lobe lesion has decreased soft tissue component compared to November radiograph. Additional Tree-in[place]bud opacities on CT are not well visualized radiographically. No pulmonary edema. Chronic compression fracture of the midthoracic spine. Bones are under mineralized. Surgical clips in the left axilla. Splenic granuloma  in the upper abdomen incidentally noted. IMPRESSION: 1. Small bilateral pleural effusions, new from prior exam. 2. Known cavitary right upper lobe lesion has decreased soft tissue component compared to November radiograph, similar to CT last month. No new airspace disease. Aortic Atherosclerosis (ICD10-I70.0). Electronically Signed   By: Keith Rake M.D.   On: 05/17/2019 17:10    ASSESSMENT AND PLAN:  1.  Normocytic anemia 2.  Pulmonary mycobacterial infection 3.  COVID-19 infection 4.  AKI 5.  COPD 6.  Rheumatoid arthritis  -Labs of been reviewed.  The patient's LDH is normal reticulocytes are low which makes this less likely hemolysis.  Her haptoglobin is pending.   That anemia is due to acute infection and treatment for her Mycobacterium infection.  Recommend supportive transfusions for hemoglobin less than 7. -Continue antibiotics for Mycobacterium per infectious disease/hospitalist.  Thank you for this referral.  Mikey Bussing, DNP, AGPCNP-BC, AOCNP  Addendum  I have spoken with pt over the phone. I agree with the assessment and and plan and have edited the notes. I have reviewed her lab results and her medical records including outside lab results   I am not sure how long she has been anemic.  Her first CBC in our system showed hemoglobin 10.1 on January 06, 2019, with normal WBC and platelet.  Her outside CBC was normal in 2012 and 2013, I do not have access to her lab in between.  Patient denies history of anemia.  Anemia was found during her work-up for pulmonary lesion, which turned out to be mycobacterial infection.  Her anemia has gotten worse since she started antibiotics Zyvox in January, 2021.  Her significant drop of hemoglobin lately could be related to Covid infection also.  Lab test showed no evidence of nutritional anemia, very low reticulocyte count, which indicating significant bone marrow suppression, likely from chronic infection and Zyvox.  There is no lab evidence of hemolysis, her white count and platelet count has been normal, I think the possibility of primary bone marrow disease, such as MDS is much less likely. I do not think she needs bone marrow biopsy at this point.  I recommend blood transfusion to keep hemoglobin above 7.5. her antibiotics are held now, and ID try to get coverage of other options.  I will arrange outpt f/u with me in 2-3 weeks for monitor her CBC.   We discussed the option of EPO if her anemia gets worse and requires frequent blood transfusion.   Truitt Merle  05/18/2019

## 2019-05-18 NOTE — Progress Notes (Signed)
  Echocardiogram 2D Echocardiogram has been performed.  Olivia Hooper 05/18/2019, 9:11 AM

## 2019-05-19 ENCOUNTER — Other Ambulatory Visit: Payer: Self-pay | Admitting: Hematology

## 2019-05-19 DIAGNOSIS — L8990 Pressure ulcer of unspecified site, unspecified stage: Secondary | ICD-10-CM | POA: Insufficient documentation

## 2019-05-19 DIAGNOSIS — D649 Anemia, unspecified: Secondary | ICD-10-CM

## 2019-05-19 LAB — TYPE AND SCREEN
ABO/RH(D): A POS
Antibody Screen: NEGATIVE
Unit division: 0
Unit division: 0
Unit division: 0

## 2019-05-19 LAB — BPAM RBC
Blood Product Expiration Date: 202103192359
Blood Product Expiration Date: 202103192359
Blood Product Expiration Date: 202104032359
ISSUE DATE / TIME: 202103102033
ISSUE DATE / TIME: 202103111009
ISSUE DATE / TIME: 202103111759
Unit Type and Rh: 6200
Unit Type and Rh: 6200
Unit Type and Rh: 6200

## 2019-05-19 LAB — GLUCOSE, CAPILLARY
Glucose-Capillary: 118 mg/dL — ABNORMAL HIGH (ref 70–99)
Glucose-Capillary: 155 mg/dL — ABNORMAL HIGH (ref 70–99)

## 2019-05-19 LAB — COMPREHENSIVE METABOLIC PANEL
ALT: 19 U/L (ref 0–44)
AST: 56 U/L — ABNORMAL HIGH (ref 15–41)
Albumin: 1.7 g/dL — ABNORMAL LOW (ref 3.5–5.0)
Alkaline Phosphatase: 101 U/L (ref 38–126)
Anion gap: 6 (ref 5–15)
BUN: 18 mg/dL (ref 8–23)
CO2: 29 mmol/L (ref 22–32)
Calcium: 8.2 mg/dL — ABNORMAL LOW (ref 8.9–10.3)
Chloride: 105 mmol/L (ref 98–111)
Creatinine, Ser: 1.56 mg/dL — ABNORMAL HIGH (ref 0.44–1.00)
GFR calc Af Amer: 36 mL/min — ABNORMAL LOW (ref >60)
GFR calc non Af Amer: 31 mL/min — ABNORMAL LOW (ref >60)
Glucose, Bld: 133 mg/dL — ABNORMAL HIGH (ref 70–99)
Potassium: 3.4 mmol/L — ABNORMAL LOW (ref 3.5–5.1)
Sodium: 140 mmol/L (ref 135–145)
Total Bilirubin: 0.5 mg/dL (ref 0.3–1.2)
Total Protein: 4.6 g/dL — ABNORMAL LOW (ref 6.5–8.1)

## 2019-05-19 LAB — CBC
HCT: 30.2 % — ABNORMAL LOW (ref 36.0–46.0)
Hemoglobin: 9.9 g/dL — ABNORMAL LOW (ref 12.0–15.0)
MCH: 27.1 pg (ref 26.0–34.0)
MCHC: 32.8 g/dL (ref 30.0–36.0)
MCV: 82.7 fL (ref 80.0–100.0)
Platelets: 141 10*3/uL — ABNORMAL LOW (ref 150–400)
RBC: 3.65 MIL/uL — ABNORMAL LOW (ref 3.87–5.11)
RDW: 17.6 % — ABNORMAL HIGH (ref 11.5–15.5)
WBC: 5.3 10*3/uL (ref 4.0–10.5)
nRBC: 0.4 % — ABNORMAL HIGH (ref 0.0–0.2)

## 2019-05-19 LAB — OCCULT BLOOD X 1 CARD TO LAB, STOOL: Fecal Occult Bld: NEGATIVE

## 2019-05-19 LAB — C-REACTIVE PROTEIN: CRP: 3.6 mg/dL — ABNORMAL HIGH (ref <1.0)

## 2019-05-19 LAB — D-DIMER, QUANTITATIVE: D-Dimer, Quant: 1.77 ug/mL-FEU — ABNORMAL HIGH (ref 0.00–0.50)

## 2019-05-19 LAB — FERRITIN: Ferritin: 562 ng/mL — ABNORMAL HIGH (ref 11–307)

## 2019-05-19 MED ORDER — GERHARDT'S BUTT CREAM
TOPICAL_CREAM | Freq: Two times a day (BID) | CUTANEOUS | Status: DC
  Administered 2019-05-19: 1 via TOPICAL
  Filled 2019-05-19: qty 1

## 2019-05-19 NOTE — Progress Notes (Signed)
PROGRESS NOTE    Olivia Hooper  GNF:[id number] DOB: 02-Feb-1941 DOA: 05/17/2019 PCP: [name], MD   Brief Narrative:   79 year old female with history of carpal tunnel syndrome, COPD, drug-induced anemia, hypercalcemia, hyperlipidemia, hyperparathyroidism, carpal tunnel syndrome, pulmonary mycobacterial infection, rheumatoid arthritis followed by Dr. Tommy Medal in the office with infectious disease was asked to come to ED for abnormal labs.  On arrival to ED her hemoglobin was around 6.8. She tested positive for COVID-19.  Chest x-ray shows small bilateral effusions and cavitary lesion is decreasing in size.  She was admitted to [facility] and infectious disease consulted for management of her mycobacterial infection in the lung.  Hematology consulted for anemia.  Patient was on Zyvox for pulmonary bacterial mycobacterial infection.  Symptomatic anemia was thought to be secondary to Zyvox or possibly from COVID-19 viral infection.  The culprit antibiotic was stopped and she was given a total of 3 units of PRBC transfusion.  Her hemoglobin improved to 9 posttransfusion and she is currently requiring up to 2 L of nasal cannula oxygen to keep sats greater than 90%. Patient seen and examined at bedside, reports feeling better than yesterday.   Assessment & Plan:   Principal Problem:   Acute respiratory disease due to COVID-19 virus Active Problems:   COPD GOLD II / still smoking    Rheumatoid arthritis ([place])   Pulmonary mycobacterial infection (H[facility])   Symptomatic anemia   AKI (acute kidney injury) ([place])   Cardiomegaly   Pressure injury of skin    Acute respiratory failure with hypoxia secondary to COVID-19 viral illness Patient reports her shortness of breath has improved coughing has improved. Nasal cannula oxygen to keep sats greater than 90% on 2 L currently.  She was started on Decadron and remdesivir continue the same to complete the course. Symptomatic management with bronchodilators and  pulmonary toilet with incentive spirometry, flutter valve and self proning as appropriate.   Symptomatic anemia probably secondary to Zyvox versus COVID-19 illness. Anemia panel reviewed, no history of melena or rectal bleed. S/p 3 units of PRBC transfusion and hemoglobin has improved to 9.9. Hematology consulted recommended to follow-up with Dr. Morey Hummingbird,     Pulmonary mycobacterial infection Patient was on IV Zyvox prior to admission which was discontinued at this time ID consulted recommended to continue watching her without antibiotics at this time.   Severe protein calorie malnutrition Dietary consulted and recommendations to be given.    COPD/ongoing smoking Wheezing has improved, continue with Combivent inhaler and albuterol as needed Continue with the flutter valve and antitussives as needed. Patient was counseled on smoking cessation by the provider yesterday.    AKI Unclear etiology, probably secondary to hypoperfusion, diuretics, anemia.  Holding torsemide today. On admission.  Patient's creatinine was at 1.95 improved to 1.5 today.  Bilateral pleural effusions and cardiomegaly on chest x-ray   Pressure injury present on admission:   Pressure Injury 05/19/19 Buttocks Right Stage 2 -  Partial thickness loss of dermis presenting as a shallow open injury with a red, pink wound bed without slough. (Active)  05/19/19 0400  Location: Buttocks  Location Orientation: Right  Staging: Stage 2 -  Partial thickness loss of dermis presenting as a shallow open injury with a red, pink wound bed without slough.  Wound Description (Comments):   Present on Admission: Yes   Wound care consulted      DVT prophylaxis: scd's  Code Status: Full code.  Family Communication: none at bedside. Discussed with son over the  phone.  Disposition Plan:  . Patient came from: Home.             . Anticipated d/c place: therapy eval pending.  . Barriers to d/c OR conditions which need to be  met to effect a safe d/c: Ongoing respiratory issues, IV remdesivir,   Consultants:   ID  Oncology/hematology.   Procedures: None   Antimicrobials: None.   Subjective: Pt seen and examined at bedside.  She is hypoxic on RA , requiring about 2 lit of [place] oxygen.  Some sob. No chest pain . No nausea, vomiting. Has diarrhea about 2 bowel movements overnight.    Objective: Vitals:   05/18/19 2159 05/19/19 0406 05/19/19 0409 05/19/19 0804  BP: (!) 110/57 (!) 105/54  (!) 104/52  Pulse: 75 64 64 68  Resp: 16 16    Temp: 98.7 F (37.1 C) 98.1 F (36.7 C)    TempSrc: Oral     SpO2: 97% 96% 96%   Weight:        Intake/Output Summary (Last 24 hours) at 05/19/2019 1122 Last data filed at 05/18/2019 2200 Gross per 24 hour  Intake 1427.14 ml  Output --  Net 1427.14 ml   Filed Weights   05/17/19 1600  Weight: 47.2 kg    Examination:  General exam: in mild distress from sob.  Respiratory system: scattered wheezing, tachypnea, diminished at bases.  Cardiovascular system: S1 & S2 heard, tachycardia  No pedal edema. Gastrointestinal system: Abdomen is nondistended, soft and nontender.  Normal bowel sounds heard. Central nervous system: Alert and oriented. Following commands. Responding to questions.  Extremities: no pedal edema.  Skin: right buttock ulcer.  Psychiatry:  Mood & affect appropriate.     Data Reviewed: I have personally reviewed following labs and imaging studies  CBC: Recent Labs  Lab 05/17/19 1412 05/18/19 0632 05/19/19 0435  WBC 6.4  --  5.3  HGB 6.8* 6.6* 9.9*  HCT 22.7* 21.3* 30.2*  MCV 80.5  --  82.7  PLT 222  --  332*   Basic Metabolic Panel: Recent Labs  Lab 05/17/19 1412 05/18/19 0632 05/19/19 0435  NA 140 140 140  K 3.3* 3.4* 3.4*  CL 100 102 105  CO2 '30 30 29  '$ GLUCOSE 93 68* 133*  BUN '16 12 18  '$ CREATININE 1.95* 1.42* 1.56*  CALCIUM 9.5 8.6* 8.2*   GFR: Estimated Creatinine Clearance: 21.8 mL/min (A) (by C-G formula based on SCr  of 1.56 mg/dL (H)). Liver Function Tests: Recent Labs  Lab 05/17/19 1412 05/19/19 0435  AST 26 56*  ALT 14 19  ALKPHOS 112 101  BILITOT 0.1* 0.5  PROT 6.6 4.6*  ALBUMIN 2.2* 1.7*   No results for input(s): LIPASE, AMYLASE in the last 168 hours. No results for input(s): AMMONIA in the last 168 hours. Coagulation Profile: No results for input(s): INR, PROTIME in the last 168 hours. Cardiac Enzymes: No results for input(s): CKTOTAL, CKMB, CKMBINDEX, TROPONINI in the last 168 hours. BNP (last 3 results) No results for input(s): PROBNP in the last 8760 hours. HbA1C: No results for input(s): HGBA1C in the last 72 hours. CBG: No results for input(s): GLUCAP in the last 168 hours. Lipid Profile: No results for input(s): CHOL, HDL, LDLCALC, TRIG, CHOLHDL, LDLDIRECT in the last 72 hours. Thyroid Function Tests: No results for input(s): TSH, T4TOTAL, FREET4, T3FREE, THYROIDAB in the last 72 hours. Anemia Panel: Recent Labs    05/18/19 0632 05/19/19 0435  VITAMINB12 328  --   FOLATE  9.6  --   FERRITIN 219 562*  TIBC 154*  --   IRON 49  --   RETICCTPCT 0.4  --    Sepsis Labs: No results for input(s): PROCALCITON, LATICACIDVEN in the last 168 hours.  Recent Results (from the past 240 hour(s))  SARS CORONAVIRUS 2 (TAT 6-24 HRS) Nasopharyngeal Nasopharyngeal Swab     Status: Abnormal   Collection Time: 05/18/19 12:09 AM   Specimen: Nasopharyngeal Swab  Result Value Ref Range Status   SARS Coronavirus 2 POSITIVE (A) NEGATIVE Final    Comment: RESULT CALLED TO, READ BACK BY AND VERIFIED WITH: Lafonda Mosses RN 8:55 05/18/19 ([email or web address]) (NOTE) SARS-CoV-2 target nucleic acids are DETECTED. The SARS-CoV-2 RNA is generally detectable in upper and lower respiratory specimens during the acute phase of infection. Positive results are indicative of the presence of SARS-CoV-2 RNA. Clinical correlation with patient history and other diagnostic information is  necessary to determine patient  infection status. Positive results do not rule out bacterial infection or co-infection with other viruses.  The expected result is Negative. Fact Sheet for Patients: SugarRoll.be Fact Sheet for Healthcare Providers: https://www.woods-mathews.com/ This test is not yet approved or cleared by the [place] FDA and  has been authorized for detection and/or diagnosis of SARS-CoV-2 by FDA under an Emergency Use Authorization (EUA). This EUA will remain  in effect (meaning this test can be used) for the  duration of the COVID-19 declaration under Section 564(b)(1) of the Act, 21 U.S.C. section 360bbb-3(b)(1), unless the authorization is terminated or revoked sooner. Performed at [facility], [place] [address], [place], [place] [address]   Respiratory Panel by PCR     Status: None   Collection Time: 05/18/19 12:09 AM   Specimen: Nasopharyngeal Swab; Respiratory  Result Value Ref Range Status   Adenovirus NOT DETECTED NOT DETECTED Final   Coronavirus 229E NOT DETECTED NOT DETECTED Final    Comment: (NOTE) The Coronavirus on the Respiratory Panel, DOES NOT test for the novel  Coronavirus (2019 nCoV)    Coronavirus HKU1 NOT DETECTED NOT DETECTED Final   Coronavirus NL63 NOT DETECTED NOT DETECTED Final   Coronavirus OC43 NOT DETECTED NOT DETECTED Final   Metapneumovirus NOT DETECTED NOT DETECTED Final   Rhinovirus / Enterovirus NOT DETECTED NOT DETECTED Final   Influenza A NOT DETECTED NOT DETECTED Final   Influenza B NOT DETECTED NOT DETECTED Final   Parainfluenza Virus 1 NOT DETECTED NOT DETECTED Final   Parainfluenza Virus 2 NOT DETECTED NOT DETECTED Final   Parainfluenza Virus 3 NOT DETECTED NOT DETECTED Final   Parainfluenza Virus 4 NOT DETECTED NOT DETECTED Final   Respiratory Syncytial Virus NOT DETECTED NOT DETECTED Final   Bordetella pertussis NOT DETECTED NOT DETECTED Final   Chlamydophila pneumoniae NOT DETECTED NOT DETECTED Final    Mycoplasma pneumoniae NOT DETECTED NOT DETECTED Final    Comment: Performed at [facility], [place]. [address] [place], [place] [address]         Radiology Studies: DG Chest 2 View  Result Date: 05/17/2019 CLINICAL DATA:  Shortness of breath for 1 week. Symptomatic anemia. Weakness and dizziness. EXAM: CHEST - 2 VIEW COMPARISON:  CT 04/10/2019, radiograph 01/18/2019 FINDINGS: Small bilateral pleural effusions, new from prior exam. Normal heart size with aortic atherosclerosis. Right chest port with tip in the lower SVC. Known cavitary right upper lobe lesion has decreased soft tissue component compared to November radiograph. Additional Tree-in-bud opacities on CT are not well visualized radiographically. No pulmonary  edema. Chronic compression fracture of the midthoracic spine. Bones are under mineralized. Surgical clips in the left axilla. Splenic granuloma in the upper abdomen incidentally noted. IMPRESSION: 1. Small bilateral pleural effusions, new from prior exam. 2. Known cavitary right upper lobe lesion has decreased soft tissue component compared to November radiograph, similar to CT last month. No new airspace disease. Aortic Atherosclerosis (ICD10-I70.0). Electronically Signed   By: Keith Rake M.D.   On: 05/17/2019 17:10   ECHOCARDIOGRAM COMPLETE  Result Date: 05/18/2019    ECHOCARDIOGRAM REPORT   Patient Name:   Olivia Hooper Date of Exam: 05/18/2019 Medical Rec #:  [id number]     Height:       61.5 in Accession #:    [id number]    Weight:       104.1 lb Date of Birth:  1940/03/26     BSA:          1.439 m Patient Age:    89 years      BP:           100/49 mmHg Patient Gender: F             HR:           77 bpm. Exam Location:  Inpatient Procedure: 2D Echo, Cardiac Doppler and Color Doppler Indications:    Cardiomegaly  History:        Patient has no prior history of Echocardiogram examinations.                 COPD, Signs/Symptoms:Shortness of Breath; Risk                  Factors:Dyslipidemia. LE edema, pleural effusion, GX:QJJHER                 Cancer, breast implants.  Sonographer:    Dustin Flock Referring Phys: [id number] [place]  Sonographer Comments: Image acquisition challenging due to COPD and Image acquisition challenging due to breast implants. IMPRESSIONS  1. Left ventricular ejection fraction, by estimation, is 60 to 65%. The left ventricle has normal function. The left ventricle has no regional wall motion abnormalities. Left ventricular diastolic parameters are consistent with Grade I diastolic dysfunction (impaired relaxation). Elevated left ventricular end-diastolic pressure.  2. Right ventricular systolic function is normal. The right ventricular size is normal. There is moderately elevated pulmonary artery systolic pressure.  3. Left atrial size was moderately dilated.  4. Right atrial size was mildly dilated.  5. Large pleural effusion in the left lateral region.  6. The mitral valve is normal in structure. Mild mitral valve regurgitation. No evidence of mitral stenosis.  7. The aortic valve is tricuspid. Aortic valve regurgitation is not visualized. No aortic stenosis is present.  8. The inferior vena cava is dilated in size with <50% respiratory variability, suggesting right atrial pressure of 15 mmHg. FINDINGS  Left Ventricle: Left ventricular ejection fraction, by estimation, is 60 to 65%. The left ventricle has normal function. The left ventricle has no regional wall motion abnormalities. The left ventricular internal cavity size was normal in size. There is  no left ventricular hypertrophy. Left ventricular diastolic parameters are consistent with Grade I diastolic dysfunction (impaired relaxation). Elevated left ventricular end-diastolic pressure. Right Ventricle: The right ventricular size is normal. No increase in right ventricular wall thickness. Right ventricular systolic function is normal. There is moderately elevated pulmonary artery  systolic pressure. The tricuspid regurgitant velocity is 2.97 m/s, and with an assumed right atrial pressure of  15 mmHg, the estimated right ventricular systolic pressure is 26.9 mmHg. Left Atrium: Left atrial size was moderately dilated. Right Atrium: Right atrial size was mildly dilated. Pericardium: There is no evidence of pericardial effusion. Mitral Valve: The mitral valve is normal in structure. Normal mobility of the mitral valve leaflets. Mild mitral valve regurgitation. No evidence of mitral valve stenosis. Tricuspid Valve: The tricuspid valve is normal in structure. Tricuspid valve regurgitation is mild . No evidence of tricuspid stenosis. Aortic Valve: The aortic valve is tricuspid. Aortic valve regurgitation is not visualized. No aortic stenosis is present. Pulmonic Valve: The pulmonic valve was normal in structure. Pulmonic valve regurgitation is not visualized. No evidence of pulmonic stenosis. Aorta: The aortic root is normal in size and structure. Venous: The inferior vena cava is dilated in size with less than 50% respiratory variability, suggesting right atrial pressure of 15 mmHg. IAS/Shunts: No atrial level shunt detected by color flow Doppler. Additional Comments: There is a large pleural effusion in the left lateral region. Moderate ascites is present.  LEFT VENTRICLE PLAX 2D LVIDd:         4.20 cm  Diastology LVIDs:         3.10 cm  LV e' lateral:   12.50 cm/s LV PW:         0.80 cm  LV E/e' lateral: 8.6 LV IVS:        0.80 cm  LV e' medial:    6.96 cm/s LVOT diam:     2.00 cm  LV E/e' medial:  15.4 LV SV:         73 LV SV Index:   50 LVOT Area:     3.14 cm  RIGHT VENTRICLE RV Basal diam:  2.30 cm RV S prime:     15.60 cm/s TAPSE (M-mode): 2.6 cm LEFT ATRIUM           Index       RIGHT ATRIUM           Index LA diam:      3.00 cm 2.08 cm/m  RA Area:     16.40 cm LA Vol (A2C): 32.6 ml 22.65 ml/m RA Volume:   44.70 ml  31.05 ml/m LA Vol (A4C): 51.2 ml 35.57 ml/m  AORTIC VALVE LVOT Vmax:    109.00 cm/s LVOT Vmean:  67.700 cm/s LVOT VTI:    0.231 m  AORTA Ao Root diam: 2.30 cm MITRAL VALVE                TRICUSPID VALVE MV Area (PHT): 3.60 cm     TR Peak grad:   35.3 mmHg MV Decel Time: 211 msec     TR Vmax:        297.00 cm/s MV E velocity: 107.00 cm/s MV A velocity: 117.00 cm/s  SHUNTS MV E/A ratio:  0.91         Systemic VTI:  0.23 m                             Systemic Diam: 2.00 cm Skeet Latch MD Electronically signed by Skeet Latch MD Signature Date/Time: 05/18/2019/1:45:22 PM    Final         Scheduled Meds: . vitamin C  500 mg Oral BID  . Chlorhexidine Gluconate Cloth  6 each Topical Daily  . dexamethasone (DECADRON) injection  6 mg Intravenous Daily  . guaiFENesin  1,200 mg Oral BID  . Ipratropium-Albuterol  2 puff Inhalation Q6H  . mometasone-formoterol  2 puff Inhalation BID  . zinc sulfate  220 mg Oral Daily   Continuous Infusions: . sodium chloride    . remdesivir 100 mg in NS 100 mL 100 mg (05/19/19 1022)     LOS: 1 day        Hosie Poisson, MD [facility]   To contact the attending provider between 7A-7P or the covering provider during after hours 7P-7A, please log into the web site [email or web address] and access using universal [place] password for that web site. If you do not have the password, please call the hospital operator.  05/19/2019, 11:22 AM

## 2019-05-19 NOTE — Consult Note (Signed)
WOC Nurse Consult Note: Reason for Consult:Partial thickness tissue loss to bilateral gluteal folds.  Incontinence associated skin damage.  Wound type:MASD Pressure Injury POA: /NA Measurement: 6 cm x 6 cm x 0,2 cm  Wound [id number] pink Drainage (amount, consistency, odor) scant weeping  Periwound:intact Dressing procedure/placement/frequency: GErhardts butt paste twice daily and PRN soilage.  No disposable briefs or underpads.  Will not follow at this time.  Please re-consult if needed.  Domenic Moras MSN, RN, FNP-BC CWON Wound, Ostomy, Continence Nurse Pager [phone]

## 2019-05-19 NOTE — Progress Notes (Signed)
Pt's oxygen saturation went down to 80s while ambulating in room on RA. At rest pt is sating in the 90s while on 1.5L O2. Currently pt is 94%. Pt denies SOB and chest pain while ambulating. Will continue to monitor.

## 2019-05-19 NOTE — Progress Notes (Addendum)
PHARMACY CONSULT NOTE FOR:  OUTPATIENT  PARENTERAL ANTIBIOTIC THERAPY (OPAT)  Indication: mycobacterium abscessus Regimen: cefoxitin 2gm IV q12h End date: 11/28/2019  Note - previously on continuous infusion but patient noted to have acute kidney injury and CrCl estimated to be <= 30 ml/min, so must do intermittent dosing at this time  ADDENDUM 05/19/2019 2:50 PM - patient unable to afford omadacycline (~$1600/mo while she is in Medicare coverage gap).  This was going to replace linezolid (stopped d/t anemia). Current plan is to develop new regimen as outpatient so cefoxitin not going to resume immediately post-discharge  IV antibiotic discharge orders are pended. To discharging provider:  please sign these orders via discharge navigator,  Select New Orders & click on the button choice - Manage This Unsigned Work.     Thank you for allowing pharmacy to be a part of this patient's care.  Doreene Eland, PharmD, BCPS.   Work Cell: [phone] 05/19/2019 10:13 AM

## 2019-05-19 NOTE — Progress Notes (Signed)
Follow up on coverage. The omadacycline will be cost prohibitive despite the approval with the PA.  She has very limited options. At this time, will hold on her antibiotics and just have her flush her line and will work with our pharmacy team and Dr. Tommy Medal for other options after discharge. Thayer Headings, MD

## 2019-05-19 NOTE — Progress Notes (Signed)
Approval for Nuzyra/omadacycline noted and will see about getting it to her.   She will take her home oral clofazimine, IV cefoxitine continuous infusion and oral omadacycline and have stopped the linezolid.   I will place OPAT order for that with possible discharge soon.   We will follow up with her after discharge.   Thayer Headings, MD

## 2019-05-20 LAB — COMPREHENSIVE METABOLIC PANEL
ALT: 19 U/L (ref 0–44)
AST: 36 U/L (ref 15–41)
Albumin: 1.7 g/dL — ABNORMAL LOW (ref 3.5–5.0)
Alkaline Phosphatase: 98 U/L (ref 38–126)
Anion gap: 8 (ref 5–15)
BUN: 15 mg/dL (ref 8–23)
CO2: 28 mmol/L (ref 22–32)
Calcium: 8.8 mg/dL — ABNORMAL LOW (ref 8.9–10.3)
Chloride: 106 mmol/L (ref 98–111)
Creatinine, Ser: 1.2 mg/dL — ABNORMAL HIGH (ref 0.44–1.00)
GFR calc Af Amer: 50 mL/min — ABNORMAL LOW (ref >60)
GFR calc non Af Amer: 43 mL/min — ABNORMAL LOW (ref >60)
Glucose, Bld: 83 mg/dL (ref 70–99)
Potassium: 3.2 mmol/L — ABNORMAL LOW (ref 3.5–5.1)
Sodium: 142 mmol/L (ref 135–145)
Total Bilirubin: 0.4 mg/dL (ref 0.3–1.2)
Total Protein: 4.9 g/dL — ABNORMAL LOW (ref 6.5–8.1)

## 2019-05-20 LAB — HAPTOGLOBIN: Haptoglobin: 141 mg/dL (ref 42–346)

## 2019-05-20 LAB — CBC
HCT: 29.3 % — ABNORMAL LOW (ref 36.0–46.0)
Hemoglobin: 9.3 g/dL — ABNORMAL LOW (ref 12.0–15.0)
MCH: 26.6 pg (ref 26.0–34.0)
MCHC: 31.7 g/dL (ref 30.0–36.0)
MCV: 84 fL (ref 80.0–100.0)
Platelets: 135 10*3/uL — ABNORMAL LOW (ref 150–400)
RBC: 3.49 MIL/uL — ABNORMAL LOW (ref 3.87–5.11)
RDW: 18.8 % — ABNORMAL HIGH (ref 11.5–15.5)
WBC: 6.8 10*3/uL (ref 4.0–10.5)
nRBC: 0 % (ref 0.0–0.2)

## 2019-05-20 LAB — C-REACTIVE PROTEIN: CRP: 2 mg/dL — ABNORMAL HIGH (ref <1.0)

## 2019-05-20 LAB — D-DIMER, QUANTITATIVE: D-Dimer, Quant: 1.81 ug/mL-FEU — ABNORMAL HIGH (ref 0.00–0.50)

## 2019-05-20 LAB — FERRITIN: Ferritin: 303 ng/mL (ref 11–307)

## 2019-05-20 MED ORDER — POTASSIUM CHLORIDE CRYS ER 20 MEQ PO TBCR
40.0000 meq | EXTENDED_RELEASE_TABLET | Freq: Once | ORAL | Status: AC
  Administered 2019-05-20: 40 meq via ORAL
  Filled 2019-05-20: qty 2

## 2019-05-20 NOTE — Evaluation (Addendum)
Physical Therapy Evaluation Patient Details Name: Olivia Hooper MRN: [id number] DOB: Jun 26, 1940 Today's Date: 05/20/2019   History of Present Illness  79 year old female with history of carpal tunnel syndrome, COPD, drug-induced anemia, hypercalcemia, hyperlipidemia, hyperparathyroidism, carpal tunnel syndrome, pulmonary mycobacterial infection, rheumatoid arthritis followed by Dr. Tommy Medal in the office with infectious disease was asked to come to ED for abnormal labs.  On arrival to ED her hemoglobin was around 6.8.She tested positive for COVID-19.  Clinical Impression  Pt admitted with above diagnosis. Pt motivated to return home, demonstrates slightly impulsive behavior trying to rise from seated surfaces without acknowledging O2 and IV lines. Pt min guard with all OOB activity due to poor safety awareness and unsteadiness noted when reaching for furniture to assist self with standing and gait. Pt able to ambulate to restroom, complete toileting with assist to clean, and returns to bedside chair. Pt desats to 86% on RA with ambulation, improves to 95% with with return of 1.5LPM O2. Pt currently with functional limitations due to the deficits listed below (see PT Problem List). Pt will benefit from skilled PT to increase their independence and safety with mobility to allow discharge to the venue listed below.       Follow Up Recommendations No PT follow up;Supervision - Intermittent    Equipment Recommendations  Rolling walker with 5" wheels    Recommendations for Other Services       Precautions / Restrictions Precautions Precautions: Fall Restrictions Weight Bearing Restrictions: No      Mobility  Bed Mobility Overal bed mobility: Independent     Transfers Overall transfer level: Needs assistance Equipment used: None Transfers: Sit to/from Stand;Stand Pivot Transfers Sit to Stand: Min guard Stand pivot transfers: Min guard       General transfer comment: verbal cues for  hand placement and O2 line/IV line for safety, slightly impulsive but responds well to cues  Ambulation/Gait Ambulation/Gait assistance: Min guard Gait Distance (Feet): 30 Feet Assistive device: None Gait Pattern/deviations: Step-through pattern Gait velocity: slightly decreased   General Gait Details: ambualtes to/from restroom, reaches for IV pole or furniture and educated to avoid due to increasing risk for falls, assistance to manage IV and O2 tubing for safety  Stairs            Wheelchair Mobility    Modified Rankin (Stroke Patients Only)       Balance Overall balance assessment: Needs assistance Sitting-balance support: Feet supported;No upper extremity supported Sitting balance-Leahy Scale: Good Sitting balance - Comments: seated EOB     Standing balance-Leahy Scale: Fair Standing balance comment: without AD                             Pertinent Vitals/Pain Pain Assessment: No/denies pain    Home Living Family/patient expects to be discharged to:: Private residence Living Arrangements: Alone Available Help at Discharge: Family;Available PRN/intermittently(adult children) Type of Home: House Home Access: Stairs to enter Entrance Stairs-Rails: Left(brick wall on other side) Entrance Stairs-Number of Steps: 4 Home Layout: One level Home Equipment: Shower seat;Grab bars - tub/shower Additional Comments: Pt reports has tools to assist her with opening due to arthritis in hands    Prior Function Level of Independence: Independent         Comments: Pt reports independent with ADLs, grocery shopping, driving, ambulating in community without AD; children do some grocery shopping due to [phone] pandemic. Pt denies any recent falls.     Hand  Dominance   Dominant Hand: Right    Extremity/Trunk Assessment   Upper Extremity Assessment Upper Extremity Assessment: Defer to OT evaluation    Lower Extremity Assessment Lower Extremity  Assessment: Overall WFL for tasks assessed(BLE grossly 3+/5, AROM WNL)    Cervical / Trunk Assessment Cervical / Trunk Assessment: Normal  Communication   Communication: No difficulties  Cognition Arousal/Alertness: Awake/alert Behavior During Therapy: WFL for tasks assessed/performed Overall Cognitive Status: Within Functional Limits for tasks assessed                                        General Comments      Exercises     Assessment/Plan    PT Assessment Patient needs continued PT services  PT Problem List Decreased activity tolerance;Decreased balance;Decreased knowledge of use of DME;Decreased safety awareness;Cardiopulmonary status limiting activity       PT Treatment Interventions DME instruction;Gait training;Stair training;Functional mobility training;Therapeutic activities;Therapeutic exercise;Balance training;Neuromuscular re-education;Patient/family education;Manual techniques    PT Goals (Current goals can be found in the Care Plan section)  Acute Rehab PT Goals Patient Stated Goal: return home with family support PT Goal Formulation: With patient Time For Goal Achievement: 05/27/19 Potential to Achieve Goals: Good    Frequency Min 3X/week   Barriers to discharge        Co-evaluation PT/OT/SLP Co-Evaluation/Treatment: Yes Reason for Co-Treatment: For patient/therapist safety;To address functional/ADL transfers PT goals addressed during session: Mobility/safety with mobility;Balance;Proper use of DME;Strengthening/ROM         AM-PAC PT "6 Clicks" Mobility  Outcome Measure Help needed turning from your back to your side while in a flat bed without using bedrails?: None Help needed moving from lying on your back to sitting on the side of a flat bed without using bedrails?: None Help needed moving to and from a bed to a chair (including a wheelchair)?: A Little Help needed standing up from a chair using your arms (e.g., wheelchair or  bedside chair)?: A Little Help needed to walk in hospital room?: A Little Help needed climbing 3-5 steps with a railing? : A Little 6 Click Score: 20    End of Session Equipment Utilized During Treatment: Gait belt;Oxygen Activity Tolerance: Patient tolerated treatment well Patient left: in chair;with call bell/phone within reach;with chair alarm set Nurse Communication: Mobility status PT Visit Diagnosis: Unsteadiness on feet (R26.81);Other abnormalities of gait and mobility (R26.89)    Time: [id number] PT Time Calculation (min) (ACUTE ONLY): 27 min   Charges:   PT Evaluation $PT Eval Moderate Complexity: 1 Mod          Tori Mir Fullilove PT, DPT 05/20/19, 11:05 AM [phone]

## 2019-05-20 NOTE — Progress Notes (Signed)
Occupational Therapy Evaluation:  Clinical Impression: Patient reports living home alone and performing all self-care tasks and functional mobility with Modified Independence. Overall patient requires CGA to Modified Independence for all self-care tasks and mobilizes with CGA. Patient's O2 stats dropped to 86% on room air after mobilizing to bathroom, verbal cues for PLB with use of portable O2 to get patient's stats to 90s. Patient required CGA for toileting needs using grab bar for steadying assist. Patient will benefit from continued skilled acute OT services.    05/20/19 1113  OT Visit Information  Assistance Needed +1  PT/OT/SLP Co-Evaluation/Treatment Yes  Reason for Co-Treatment For patient/therapist safety;To address functional/ADL transfers  PT goals addressed during session Mobility/safety with mobility;Balance  OT goals addressed during session ADL's and self-care  History of Present Illness 79 year old female with history of carpal tunnel syndrome, COPD, drug-induced anemia, hypercalcemia, hyperlipidemia, hyperparathyroidism, carpal tunnel syndrome, pulmonary mycobacterial infection, rheumatoid arthritis followed by Dr. Tommy Medal in the office with infectious disease was asked to come to ED for abnormal labs.  On arrival to ED her hemoglobin was around 6.8.She tested positive for COVID-19.  Precautions  Precautions Fall;Other (comment) (Airborne)  Restrictions  Weight Bearing Restrictions No  Home Living  Family/patient expects to be discharged to: Private residence  Living Arrangements Alone  Available Help at Discharge Family;Available PRN/intermittently  Type of Home House  Home Access Stairs to enter  Entrance Stairs-Number of Steps 4  Entrance Stairs-Rails Left  Home Layout One level  Bathroom Shower/Tub Tub/shower unit;Walk-in shower  Bathroom Toilet Handicapped height  Bathroom Accessibility Yes  How Accessible Accessible via walker  [place] seat;Grab  bars - tub/shower  Prior Function  Level of Independence Independent  Comments Pt reports independent with ADLs, grocery shopping, driving, ambulating in community without AD; children do some grocery shopping due to [phone] pandemic. Pt denies any recent falls.  Communication  Communication No difficulties  Pain Assessment  Pain Assessment No/denies pain  Cognition  Arousal/Alertness Awake/alert  Behavior During Therapy WFL for tasks assessed/performed  Overall Cognitive Status Within Functional Limits for tasks assessed  Upper Extremity Assessment  Upper Extremity Assessment LUE deficits/detail;RUE deficits/detail  RUE  (Grossly 3-/5 for grip )  RUE Coordination decreased fine motor (rheumatoid arthritis, ulnar drift)  LUE  (Grossly 3-/5 for grip )  LUE Coordination decreased fine motor (rheumatoid arthritis, ulnar drift)  Lower Extremity Assessment  Lower Extremity Assessment Defer to PT evaluation  ADL  Overall ADL's  Needs assistance/impaired  Eating/Feeding Modified independent  Grooming Set up;Wash/dry face;Wash/dry hands  Upper Body Bathing Set up  Lower Body Bathing Min guard  Upper Body Dressing  Set up  Lower Body Dressing Min guard  Toilet Transfer Min guard  Toileting- Water quality scientist and Hygiene Min guard  Tub/ Banker Min guard  Functional mobility during ADLs Min guard  Vision- History  Baseline Vision/History No visual deficits  Bed Mobility  Overal bed mobility Modified Independent  Transfers  Overall transfer level Needs assistance  Sit to Stand Min guard  Stand pivot transfers Min guard  Balance  Sitting-balance support Feet supported;No upper extremity supported  Sitting balance-Leahy Scale Good  Standing balance-Leahy Scale Fair  Standing balance comment without AD  General Comments  General comments (skin integrity, edema, etc.) Redness on Sacrum area  OT - End of Session  Equipment Utilized During Treatment Oxygen  Activity  Tolerance Patient tolerated treatment well  Patient left in chair;with call bell/phone within reach;with chair alarm set  Nurse Communication  Mobility status  OT Assessment  OT Recommendation/Assessment Patient needs continued OT Services  OT Visit Diagnosis Muscle weakness (generalized) (M62.81)  OT Problem List Decreased strength;Decreased activity tolerance;Impaired balance (sitting and/or standing);Decreased safety awareness;Cardiopulmonary status limiting activity;Decreased knowledge of use of DME or AE;Decreased coordination  OT Plan  OT Frequency (ACUTE ONLY) Min 2X/week  OT Treatment/Interventions (ACUTE ONLY) Self-care/ADL training;Therapeutic exercise;Energy conservation;DME and/or AE instruction;Therapeutic activities;Balance training;Patient/family education  AM-PAC OT "6 Clicks" Daily Activity Outcome Measure (Version 2)  Help from another person eating meals? 4  Help from another person taking care of personal grooming? 3  Help from another person toileting, which includes using toliet, bedpan, or urinal? 3  Help from another person bathing (including washing, rinsing, drying)? 3  Help from another person to put on and taking off regular upper body clothing? 3  Help from another person to put on and taking off regular lower body clothing? 3  6 Click Score 19  OT Recommendation  Follow Up Recommendations No OT follow up  OT Equipment None recommended by OT  Individuals Consulted  Consulted and Agree with Results and Recommendations Patient  Acute Rehab OT Goals  Patient Stated Goal return home with family support  OT Goal Formulation With patient  Time For Goal Achievement 06/03/19  Potential to Achieve Goals Good  OT Time Calculation  OT Start Time (ACUTE ONLY) 0853  OT Stop Time (ACUTE ONLY) 0920  OT Time Calculation (min) 27 min  OT General Charges  $OT Visit 1 Visit  OT Evaluation  $OT Eval Moderate Complexity 1 Mod  OT Treatments  $Self Care/Home Management   8-22 mins  Written Expression  Dominant Hand Right   Suly Vukelich OTR/L

## 2019-05-20 NOTE — Progress Notes (Signed)
PROGRESS NOTE    Olivia Hooper  JIR:[id number] DOB: [birth year] DOA: 05/17/2019 PCP: [name], MD   Brief Narrative:   79 year old female with history of carpal tunnel syndrome, COPD, drug-induced anemia, hypercalcemia, hyperlipidemia, hyperparathyroidism, carpal tunnel syndrome, pulmonary mycobacterial infection, rheumatoid arthritis followed by Dr. Tommy Medal in the office with infectious disease was asked to come to ED for abnormal labs.  On arrival to ED her hemoglobin was around 6.8. She tested positive for COVID-19.  Chest x-ray shows small bilateral effusions and cavitary lesion is decreasing in size.  She was admitted to [facility] and infectious disease consulted for management of her mycobacterial infection in the lung.  Hematology consulted for anemia.  Patient was on Zyvox for pulmonary bacterial mycobacterial infection.  Symptomatic anemia was thought to be secondary to Zyvox or possibly from COVID-19 viral infection.  The culprit antibiotic was stopped and she was given a total of 3 units of PRBC transfusion.  Her hemoglobin improved to 9 posttransfusion and she is currently requiring up to 2 L of nasal cannula oxygen to keep sats greater than 90%. Patient seen and examined at bedside she reports her breathing is the same as yesterday and continues to require between 1 to 2 L of nasal cannula oxygen.   Assessment & Plan:   Principal Problem:   Acute respiratory disease due to COVID-19 virus Active Problems:   COPD GOLD II / still smoking    Rheumatoid arthritis ([place])   Pulmonary mycobacterial infection ([facility])   Symptomatic anemia   AKI (acute kidney injury) ([place])   Cardiomegaly   Pressure injury of skin    Acute respiratory failure with hypoxia secondary to COVID-19 viral illness Improving, on exam today she has some wheezing. Nasal cannula oxygen to keep sats greater than 90% on 2 L currently.  She was started on Decadron and remdesivir continue the same to complete the course.  Symptomatic management with bronchodilators and pulmonary toilet with incentive spirometry, flutter valve and self proning as appropriate and bronchodilators. Improving CRP to 2, ferritin is 303, D-dimer is 1.8, fibrinogen is 231   Symptomatic anemia probably secondary to Zyvox versus COVID-19 illness. Anemia panel reviewed, no history of melena or rectal bleed. S/p 3 units of PRBC transfusion and hemoglobin has improved to 9.9.  H&H today is 9.3 Hematology consulted recommended to follow-up with Dr. Burr Medico in the office. ,     Pulmonary mycobacterial infection Patient was on IV Zyvox prior to admission which was discontinued at this time.  ID consulted recommended to continue watching her without antibiotics at this time.  Recommend outpatient follow-up with Dr. Tommy Medal to discuss further antibiotics.   Severe protein calorie malnutrition Dietary consulted and recommendations to be given.    COPD/ongoing smoking Wheezing has improved, continue with Combivent inhaler and albuterol as needed Continue with the flutter valve and antitussives as needed. Patient was counseled on smoking cessation by the provider yesterday. No new complaints at this time    AKI Unclear etiology, probably secondary to hypoperfusion, diuretics, anemia.  Holding torsemide today. On admission.  Patient's creatinine was at 1.95 improved to 1.2 today  Bilateral pleural effusions and cardiomegaly on chest x-ray Echocardiogram is ordered and it shows Left ventricular ejection fraction, by estimation, is 60 to 65%. The  left ventricle has normal function. The left ventricle has no regional  wall motion abnormalities. Left ventricular diastolic parameters are  consistent with Grade I diastolic dysfunction (impaired relaxation). Elevated left ventricular end-diastolic pressure.   Pressure  injury present on admission:   Pressure Injury 05/19/19 Buttocks Right Stage 2 -  Partial thickness loss of dermis presenting  as a shallow open injury with a red, pink wound bed without slough. (Active)  05/19/19 0400  Location: Buttocks  Location Orientation: Right  Staging: Stage 2 -  Partial thickness loss of dermis presenting as a shallow open injury with a red, pink wound bed without slough.  Wound Description (Comments):   Present on Admission: Yes   Wound care consulted    Hypokalemia Replaced  DVT prophylaxis: scd's  Code Status: Full code.  Family Communication: none at bedside. Discussed with son over the phone.  Disposition Plan:  . Patient came from: Home.             . Anticipated d/c place: therapy eval pending.  . Barriers to d/c OR conditions which need to be met to effect a safe d/c: Ongoing respiratory issues, IV remdesivir,.    Consultants:   ID  Oncology/hematology.   Procedures: None   Antimicrobials: None.   Subjective: On exam she has some Scattered wheezing.  She reports her breathing is the same and some occasional coughing.  No chest pain, no nausea vomiting diarrhea is persistent and she reports her diarrhea is chronic Objective: Vitals:   05/19/19 2015 05/19/19 2104 05/19/19 2112 05/20/19 0646  BP: (!) 114/58 117/61  (!) 123/59  Pulse: 80 74  61  Resp: 20 16  (!) 22  Temp: 98.6 F (37 C) 98 F (36.7 C)  98.7 F (37.1 C)  TempSrc: Oral Oral  Oral  SpO2: 92% 96% 94% 95%  Weight:        Intake/Output Summary (Last 24 hours) at 05/20/2019 1216 Last data filed at 05/20/2019 6568 Gross per 24 hour  Intake 167.71 ml  Output -  Net 167.71 ml   Filed Weights   05/17/19 1600  Weight: 47.2 kg    Examination:  General exam: Alert on 2 L of nasal cannula oxygen not in any kind of distress Respiratory system: Scattered wheezing, diminished air entry bilateral, tachypnea present on ambulation Cardiovascular system: S1-S2 heard, regular rate rhythm, no pedal edema Gastrointestinal system: Abdomen is soft, nontender, nondistended, bowel sounds normal Central  nervous system: Oriented, grossly nonfocal Extremities: No pedal edema.  Skin: Right buttock ulcer Psychiatry: Mood is appropriate.     Data Reviewed: I have personally reviewed following labs and imaging studies  CBC: Recent Labs  Lab 05/17/19 1412 05/18/19 0632 05/19/19 0435 05/20/19 0502  WBC 6.4  --  5.3 6.8  HGB 6.8* 6.6* 9.9* 9.3*  HCT 22.7* 21.3* 30.2* 29.3*  MCV 80.5  --  82.7 84.0  PLT 222  --  141* 127*   Basic Metabolic Panel: Recent Labs  Lab 05/17/19 1412 05/18/19 0632 05/19/19 0435 05/20/19 0502  NA 140 140 140 142  K 3.3* 3.4* 3.4* 3.2*  CL 100 102 105 106  CO2 '30 30 29 28  '$ GLUCOSE 93 68* 133* 83  BUN '16 12 18 15  '$ CREATININE 1.95* 1.42* 1.56* 1.20*  CALCIUM 9.5 8.6* 8.2* 8.8*   GFR: Estimated Creatinine Clearance: 28.3 mL/min (A) (by C-G formula based on SCr of 1.2 mg/dL (H)). Liver Function Tests: Recent Labs  Lab 05/17/19 1412 05/19/19 0435 05/20/19 0502  AST 26 56* 36  ALT '14 19 19  '$ ALKPHOS 112 101 98  BILITOT 0.1* 0.5 0.4  PROT 6.6 4.6* 4.9*  ALBUMIN 2.2* 1.7* 1.7*   No results for input(s): LIPASE,  AMYLASE in the last 168 hours. No results for input(s): AMMONIA in the last 168 hours. Coagulation Profile: No results for input(s): INR, PROTIME in the last 168 hours. Cardiac Enzymes: No results for input(s): CKTOTAL, CKMB, CKMBINDEX, TROPONINI in the last 168 hours. BNP (last 3 results) No results for input(s): PROBNP in the last 8760 hours. HbA1C: No results for input(s): HGBA1C in the last 72 hours. CBG: Recent Labs  Lab 05/19/19 1129 05/19/19 1726  GLUCAP 118* 155*   Lipid Profile: No results for input(s): CHOL, HDL, LDLCALC, TRIG, CHOLHDL, LDLDIRECT in the last 72 hours. Thyroid Function Tests: No results for input(s): TSH, T4TOTAL, FREET4, T3FREE, THYROIDAB in the last 72 hours. Anemia Panel: Recent Labs    05/18/19 0632 05/18/19 0632 05/19/19 0435 05/20/19 0502  VITAMINB12 328  --   --   --   FOLATE 9.6  --   --    --   FERRITIN 219   < > 562* 303  TIBC 154*  --   --   --   IRON 49  --   --   --   RETICCTPCT 0.4  --   --   --    < > = values in this interval not displayed.   Sepsis Labs: No results for input(s): PROCALCITON, LATICACIDVEN in the last 168 hours.  Recent Results (from the past 240 hour(s))  SARS CORONAVIRUS 2 (TAT 6-24 HRS) Nasopharyngeal Nasopharyngeal Swab     Status: Abnormal   Collection Time: 05/18/19 12:09 AM   Specimen: Nasopharyngeal Swab  Result Value Ref Range Status   SARS Coronavirus 2 POSITIVE (A) NEGATIVE Final    Comment: RESULT CALLED TO, READ BACK BY AND VERIFIED WITH: Lafonda Mosses RN 8:55 05/18/19 ([email or web address]) (NOTE) SARS-CoV-2 target nucleic acids are DETECTED. The SARS-CoV-2 RNA is generally detectable in upper and lower respiratory specimens during the acute phase of infection. Positive results are indicative of the presence of SARS-CoV-2 RNA. Clinical correlation with patient history and other diagnostic information is  necessary to determine patient infection status. Positive results do not rule out bacterial infection or co-infection with other viruses.  The expected result is Negative. Fact Sheet for Patients: SugarRoll.be Fact Sheet for Healthcare Providers: https://www.woods-mathews.com/ This test is not yet approved or cleared by the [place] FDA and  has been authorized for detection and/or diagnosis of SARS-CoV-2 by FDA under an Emergency Use Authorization (EUA). This EUA will remain  in effect (meaning this test can be used) for the  duration of the COVID-19 declaration under Section 564(b)(1) of the Act, 21 U.S.C. section 360bbb-3(b)(1), unless the authorization is terminated or revoked sooner. Performed at [facility], [place] [address] [place], [place] [address]   Respiratory Panel by PCR     Status: None   Collection Time: 05/18/19 12:09 AM   Specimen: Nasopharyngeal Swab; Respiratory  Result  Value Ref Range Status   Adenovirus NOT DETECTED NOT DETECTED Final   Coronavirus 229E NOT DETECTED NOT DETECTED Final    Comment: (NOTE) The Coronavirus on the Respiratory Panel, DOES NOT test for the novel  Coronavirus (2019 nCoV)    Coronavirus HKU1 NOT DETECTED NOT DETECTED Final   Coronavirus NL63 NOT DETECTED NOT DETECTED Final   Coronavirus OC43 NOT DETECTED NOT DETECTED Final   Metapneumovirus NOT DETECTED NOT DETECTED Final   Rhinovirus / Enterovirus NOT DETECTED NOT DETECTED Final   Influenza A NOT DETECTED NOT DETECTED Final   Influenza B NOT DETECTED NOT DETECTED Final  Parainfluenza Virus 1 NOT DETECTED NOT DETECTED Final   Parainfluenza Virus 2 NOT DETECTED NOT DETECTED Final   Parainfluenza Virus 3 NOT DETECTED NOT DETECTED Final   Parainfluenza Virus 4 NOT DETECTED NOT DETECTED Final   Respiratory Syncytial Virus NOT DETECTED NOT DETECTED Final   Bordetella pertussis NOT DETECTED NOT DETECTED Final   Chlamydophila pneumoniae NOT DETECTED NOT DETECTED Final   Mycoplasma pneumoniae NOT DETECTED NOT DETECTED Final    Comment: Performed at [facility], [place] [address], [place], [place] [address]         Radiology Studies: No results found.      Scheduled Meds: . vitamin C  500 mg Oral BID  . Chlorhexidine Gluconate Cloth  6 each Topical Daily  . dexamethasone (DECADRON) injection  6 mg Intravenous Daily  . Gerhardt's butt cream   Topical BID  . guaiFENesin  1,200 mg Oral BID  . Ipratropium-Albuterol  2 puff Inhalation Q6H  . mometasone-formoterol  2 puff Inhalation BID  . zinc sulfate  220 mg Oral Daily   Continuous Infusions: . sodium chloride    . remdesivir 100 mg in NS 100 mL 100 mg (05/20/19 1014)     LOS: 2 days        Hosie Poisson, MD [facility]   To contact the attending provider between 7A-7P or the covering provider during after hours 7P-7A, please log into the web site [email or web address] and access using universal [facility] password for that web site. If you do not have the password, please call the hospital operator.  05/20/2019, 12:16 PM

## 2019-05-21 LAB — COMPREHENSIVE METABOLIC PANEL
ALT: 20 U/L (ref 0–44)
AST: 33 U/L (ref 15–41)
Albumin: 1.9 g/dL — ABNORMAL LOW (ref 3.5–5.0)
Alkaline Phosphatase: 81 U/L (ref 38–126)
Anion gap: 9 (ref 5–15)
BUN: 17 mg/dL (ref 8–23)
CO2: 28 mmol/L (ref 22–32)
Calcium: 8.7 mg/dL — ABNORMAL LOW (ref 8.9–10.3)
Chloride: 109 mmol/L (ref 98–111)
Creatinine, Ser: 1.09 mg/dL — ABNORMAL HIGH (ref 0.44–1.00)
GFR calc Af Amer: 56 mL/min — ABNORMAL LOW (ref >60)
GFR calc non Af Amer: 48 mL/min — ABNORMAL LOW (ref >60)
Glucose, Bld: 83 mg/dL (ref 70–99)
Potassium: 3.8 mmol/L (ref 3.5–5.1)
Sodium: 146 mmol/L — ABNORMAL HIGH (ref 135–145)
Total Bilirubin: 0.7 mg/dL (ref 0.3–1.2)
Total Protein: 5 g/dL — ABNORMAL LOW (ref 6.5–8.1)

## 2019-05-21 LAB — CBC
HCT: 31 % — ABNORMAL LOW (ref 36.0–46.0)
Hemoglobin: 9.7 g/dL — ABNORMAL LOW (ref 12.0–15.0)
MCH: 26.7 pg (ref 26.0–34.0)
MCHC: 31.3 g/dL (ref 30.0–36.0)
MCV: 85.4 fL (ref 80.0–100.0)
Platelets: 133 10*3/uL — ABNORMAL LOW (ref 150–400)
RBC: 3.63 MIL/uL — ABNORMAL LOW (ref 3.87–5.11)
RDW: 19.5 % — ABNORMAL HIGH (ref 11.5–15.5)
WBC: 6 10*3/uL (ref 4.0–10.5)
nRBC: 0 % (ref 0.0–0.2)

## 2019-05-21 LAB — FERRITIN: Ferritin: 216 ng/mL (ref 11–307)

## 2019-05-21 LAB — D-DIMER, QUANTITATIVE: D-Dimer, Quant: 1.92 ug/mL-FEU — ABNORMAL HIGH (ref 0.00–0.50)

## 2019-05-21 LAB — C-REACTIVE PROTEIN: CRP: 2.6 mg/dL — ABNORMAL HIGH (ref <1.0)

## 2019-05-21 NOTE — Progress Notes (Signed)
PROGRESS NOTE    Olivia Hooper  PIR:[id number] DOB: 04-14-1940 DOA: 05/17/2019 PCP: [name], MD   Brief Narrative:   79 year old female with history of carpal tunnel syndrome, COPD, drug-induced anemia, hypercalcemia, hyperlipidemia, hyperparathyroidism, carpal tunnel syndrome, pulmonary mycobacterial infection, rheumatoid arthritis followed by Dr. Tommy Medal in the office with infectious disease was asked to come to ED for abnormal labs.  On arrival to ED her hemoglobin was around 6.8. She tested positive for COVID-19.  Chest x-ray shows small bilateral effusions and cavitary lesion is decreasing in size.  She was admitted to [facility] and infectious disease consulted for management of her mycobacterial infection in the lung.  Hematology consulted for anemia.  Patient was on Zyvox for pulmonary bacterial mycobacterial infection.  Symptomatic anemia was thought to be secondary to Zyvox or possibly from COVID-19 viral infection.  The culprit antibiotic was stopped and she was given a total of 3 units of PRBC transfusion.  Her hemoglobin improved to 9 posttransfusion . As per RN patient has been off oxygen all night and her breathing has much improved.  As per the patient she reports she is back to baseline and has shortness of breath only on ambulation and is requiring up to 1 L of nasal cannula oxygen.   Assessment & Plan:   Principal Problem:   Acute respiratory disease due to COVID-19 virus Active Problems:   COPD GOLD II / still smoking    Rheumatoid arthritis ([place])   Pulmonary mycobacterial infection (HCC)   Symptomatic anemia   AKI (acute kidney injury) ([place])   Cardiomegaly   Pressure injury of skin    Acute respiratory failure with hypoxia secondary to COVID-19 viral illness Improving, no wheezing heard on exam today. She will have completed IV remdesivir by tomorrow and continue the Decadron to complete the course. Patient has been off oxygen all night and is currently requiring 1 to  2 L of oxygen only on ambulation. Continue with bronchodilators, incentive spirometry, flutter valve and self proning as appropriate. Improving CRP to 2, ferritin is 303, D-dimer is 1.8, fibrinogen is 231   Symptomatic anemia probably secondary to Zyvox versus COVID-19 illness. Anemia panel reviewed, no history of melena or rectal bleed. S/p 3 units of PRBC transfusion and hemoglobin has improved to 9.9.  Myoglobin at 9.7 today. Hematology consulted recommended to follow-up with Dr. Burr Medico in the office. ,     Pulmonary mycobacterial infection Patient was on IV Zyvox prior to admission which was discontinued at this time.  ID consulted recommended to continue watching her without antibiotics at this time.  Recommend outpatient follow-up with Dr. Tommy Medal to discuss further antibiotics.   Severe protein calorie malnutrition Dietary consulted and recommendations to be given.    COPD/ongoing smoking Wheezing has improved, continue with Combivent inhaler and albuterol as needed Continue with the flutter valve and antitussives as needed. Patient was counseled on smoking cessation by the provider yesterday. No new complaints at this time    AKI Unclear etiology, probably secondary to hypoperfusion, diuretics, anemia.  Holding torsemide today. On admission.  Patient's creatinine was at 1.95 on admission ,  improved to 1.today.   Bilateral pleural effusions and cardiomegaly on chest x-ray Echocardiogram is ordered and it shows Left ventricular ejection fraction, by estimation, is 60 to 65%. The  left ventricle has normal function. The left ventricle has no regional  wall motion abnormalities. Left ventricular diastolic parameters are  consistent with Grade I diastolic dysfunction (impaired relaxation). Elevated left ventricular  end-diastolic pressure.  Improving oxygen sats.   Pressure injury present on admission:   Pressure Injury 05/19/19 Buttocks Right Stage 2 -  Partial thickness  loss of dermis presenting as a shallow open injury with a red, pink wound bed without slough. (Active)  05/19/19 0400  Location: Buttocks  Location Orientation: Right  Staging: Stage 2 -  Partial thickness loss of dermis presenting as a shallow open injury with a red, pink wound bed without slough.  Wound Description (Comments):   Present on Admission: Yes   Wound care consulted and appreciate recommendations to continue with Gerhardt's butt paste twice daily.    Hypokalemia Replaced  Mild hypernatremia.  Asymptomatic  DVT prophylaxis: scd's  Code Status: Full code.  Family Communication: none at bedside. Discussed with son over the phone.  Disposition Plan:  . Patient came from: Home.             . Anticipated d/c place: therapy eval pending.  . Barriers to d/c OR conditions which need to be met to effect a safe d/c: Ongoing respiratory issues, IV remdesivir,.    Consultants:   ID  Oncology/hematology.   Procedures: None   Antimicrobials: None.   Subjective: Patient reports her breathing has much improved and she is close to her baseline.  Has ongoing diarrhea which is chronic. No chest pain, nausea or vomiting or abdominal pain Objective: Vitals:   05/20/19 2025 05/21/19 0816 05/21/19 1134 05/21/19 1254  BP: (!) 120/55   124/64  Pulse: 71   61  Resp: 18   16  Temp: 99.7 F (37.6 C)  98.4 F (36.9 C) 98.1 F (36.7 C)  TempSrc: Oral  Oral Oral  SpO2: 96% 91%  97%  Weight:        Intake/Output Summary (Last 24 hours) at 05/21/2019 1520 Last data filed at 05/21/2019 1433 Gross per 24 hour  Intake 240 ml  Output --  Net 240 ml   Filed Weights   05/17/19 1600  Weight: 47.2 kg    Examination:  General exam: Alert and oriented on 1 L of nasal cannula oxygen Respiratory system: Decreased air entry at bases, some tachypnea on ambulation, no wheezing heard Cardiovascular system: S1-S2 heard, regular rate rhythm, no pedal edema Gastrointestinal system:  Abdomen is soft, nontender, nondistended, bowel sounds normal Central nervous system: Patient is alert and oriented, grossly nonfocal Extremities: No pedal edema, cyanosis Skin: Right Buttock Ulcer Psychiatry: Mood is appropriate    Data Reviewed: I have personally reviewed following labs and imaging studies  CBC: Recent Labs  Lab 05/17/19 1412 05/18/19 0632 05/19/19 0435 05/20/19 0502 05/21/19 0456  WBC 6.4  --  5.3 6.8 6.0  HGB 6.8* 6.6* 9.9* 9.3* 9.7*  HCT 22.7* 21.3* 30.2* 29.3* 31.0*  MCV 80.5  --  82.7 84.0 85.4  PLT 222  --  141* 135* 979*   Basic Metabolic Panel: Recent Labs  Lab 05/17/19 1412 05/18/19 0632 05/19/19 0435 05/20/19 0502 05/21/19 0456  NA 140 140 140 142 146*  K 3.3* 3.4* 3.4* 3.2* 3.8  CL 100 102 105 106 109  CO2 _0 GLUCOSE 93 68* 133* 83 83  BUN _1 CREATININE 1.95* 1.42* 1.56* 1.20* 1.09*  CALCIUM 9.5 8.6* 8.2* 8.8* 8.7*   GFR: Estimated Creatinine Clearance: 31.2 mL/min (A) (by C-G formula based on SCr of 1.09 mg/dL (H)). Liver Function Tests: Recent Labs  Lab 05/17/19 1412 05/19/19 0435 05/20/19 0502 05/21/19 1504  AST 26 56* 36 33  ALT _0 ALKPHOS 112 101 98 81  BILITOT 0.1* 0.5 0.4 0.7  PROT 6.6 4.6* 4.9* 5.0*  ALBUMIN 2.2* 1.7* 1.7* 1.9*   No results for input(s): LIPASE, AMYLASE in the last 168 hours. No results for input(s): AMMONIA in the last 168 hours. Coagulation Profile: No results for input(s): INR, PROTIME in the last 168 hours. Cardiac Enzymes: No results for input(s): CKTOTAL, CKMB, CKMBINDEX, TROPONINI in the last 168 hours. BNP (last 3 results) No results for input(s): PROBNP in the last 8760 hours. HbA1C: No results for input(s): HGBA1C in the last 72 hours. CBG: Recent Labs  Lab 05/19/19 1129 05/19/19 1726  GLUCAP 118* 155*   Lipid Profile: No results for input(s): CHOL, HDL, LDLCALC, TRIG, CHOLHDL, LDLDIRECT in the last 72 hours. Thyroid Function Tests: No  results for input(s): TSH, T4TOTAL, FREET4, T3FREE, THYROIDAB in the last 72 hours. Anemia Panel: Recent Labs    05/20/19 0502 05/21/19 0456  FERRITIN 303 216   Sepsis Labs: No results for input(s): PROCALCITON, LATICACIDVEN in the last 168 hours.  Recent Results (from the past 240 hour(s))  SARS CORONAVIRUS 2 (TAT 6-24 HRS) Nasopharyngeal Nasopharyngeal Swab     Status: Abnormal   Collection Time: 05/18/19 12:09 AM   Specimen: Nasopharyngeal Swab  Result Value Ref Range Status   SARS Coronavirus 2 POSITIVE (A) NEGATIVE Final    Comment: RESULT CALLED TO, READ BACK BY AND VERIFIED WITH: Lafonda Mosses RN 8:55 05/18/19 ([email or web address]) (NOTE) SARS-CoV-2 target nucleic acids are DETECTED. The SARS-CoV-2 RNA is generally detectable in upper and lower respiratory specimens during the acute phase of infection. Positive results are indicative of the presence of SARS-CoV-2 RNA. Clinical correlation with patient history and other diagnostic information is  necessary to determine patient infection status. Positive results do not rule out bacterial infection or co-infection with other viruses.  The expected result is Negative. Fact Sheet for Patients: SugarRoll.be Fact Sheet for Healthcare Providers: https://www.woods-mathews.com/ This test is not yet approved or cleared by the [place] FDA and  has been authorized for detection and/or diagnosis of SARS-CoV-2 by FDA under an Emergency Use Authorization (EUA). This EUA will remain  in effect (meaning this test can be used) for the  duration of the COVID-19 declaration under Section 564(b)(1) of the Act, 21 U.S.C. section 360bbb-3(b)(1), unless the authorization is terminated or revoked sooner. Performed at [facility], [place] [address], [place], [place] [address]   Respiratory Panel by PCR     Status: None   Collection Time: 05/18/19 12:09 AM   Specimen: Nasopharyngeal Swab; Respiratory  Result  Value Ref Range Status   Adenovirus NOT DETECTED NOT DETECTED Final   Coronavirus 229E NOT DETECTED NOT DETECTED Final    Comment: (NOTE) The Coronavirus on the Respiratory Panel, DOES NOT test for the novel  Coronavirus (2019 nCoV)    Coronavirus HKU1 NOT DETECTED NOT DETECTED Final   Coronavirus NL63 NOT DETECTED NOT DETECTED Final   Coronavirus OC43 NOT DETECTED NOT DETECTED Final   Metapneumovirus NOT DETECTED NOT DETECTED Final   Rhinovirus / Enterovirus NOT DETECTED NOT DETECTED Final   Influenza A NOT DETECTED NOT DETECTED Final   Influenza B NOT DETECTED NOT DETECTED Final   Parainfluenza Virus 1 NOT DETECTED NOT DETECTED Final   Parainfluenza Virus 2 NOT DETECTED NOT DETECTED Final   Parainfluenza Virus 3 NOT DETECTED NOT DETECTED Final   Parainfluenza Virus 4 NOT DETECTED NOT DETECTED  Final   Respiratory Syncytial Virus NOT DETECTED NOT DETECTED Final   Bordetella pertussis NOT DETECTED NOT DETECTED Final   Chlamydophila pneumoniae NOT DETECTED NOT DETECTED Final   Mycoplasma pneumoniae NOT DETECTED NOT DETECTED Final    Comment: Performed at [facility], [place] [address], [place], [place] [address]         Radiology Studies: No results found.      Scheduled Meds: . vitamin C  500 mg Oral BID  . Chlorhexidine Gluconate Cloth  6 each Topical Daily  . dexamethasone (DECADRON) injection  6 mg Intravenous Daily  . Gerhardt's butt cream   Topical BID  . guaiFENesin  1,200 mg Oral BID  . Ipratropium-Albuterol  2 puff Inhalation Q6H  . mometasone-formoterol  2 puff Inhalation BID  . zinc sulfate  220 mg Oral Daily   Continuous Infusions: . sodium chloride    . remdesivir 100 mg in NS 100 mL 100 mg (05/21/19 0813)     LOS: 3 days        Hosie Poisson, MD [facility]   To contact the attending provider between 7A-7P or the covering provider during after hours 7P-7A, please log into the web site [email or web address] and access using universal [facility] password for that web site. If you do not have the password, please call the hospital operator.  05/21/2019, 3:20 PM

## 2019-05-22 ENCOUNTER — Ambulatory Visit: Payer: Medicare Other | Admitting: Podiatry

## 2019-05-22 ENCOUNTER — Telehealth: Payer: Self-pay

## 2019-05-22 LAB — BASIC METABOLIC PANEL
Anion gap: 9 (ref 5–15)
BUN: 17 mg/dL (ref 8–23)
CO2: 27 mmol/L (ref 22–32)
Calcium: 8.7 mg/dL — ABNORMAL LOW (ref 8.9–10.3)
Chloride: 107 mmol/L (ref 98–111)
Creatinine, Ser: 1.02 mg/dL — ABNORMAL HIGH (ref 0.44–1.00)
GFR calc Af Amer: >60 mL/min (ref >60)
GFR calc non Af Amer: 52 mL/min — ABNORMAL LOW (ref >60)
Glucose, Bld: 74 mg/dL (ref 70–99)
Potassium: 3.6 mmol/L (ref 3.5–5.1)
Sodium: 143 mmol/L (ref 135–145)

## 2019-05-22 MED ORDER — HEPARIN SOD (PORK) LOCK FLUSH 100 UNIT/ML IV SOLN
500.0000 [IU] | INTRAVENOUS | Status: DC | PRN
  Filled 2019-05-22: qty 5

## 2019-05-22 MED ORDER — DEXAMETHASONE 6 MG PO TABS: 6.0000 mg | ORAL_TABLET | Freq: Every day | ORAL | 0 refills | Status: DC

## 2019-05-22 MED ORDER — GUAIFENESIN-DM 100-10 MG/5ML PO SYRP: 5.0000 mL | ORAL_SOLUTION | ORAL | 0 refills | Status: DC | PRN

## 2019-05-22 MED ORDER — ZINC SULFATE 220 (50 ZN) MG PO CAPS: 220.0000 mg | ORAL_CAPSULE | Freq: Every day | ORAL | Status: DC

## 2019-05-22 MED ORDER — IPRATROPIUM-ALBUTEROL 20-100 MCG/ACT IN AERS: 2.0000 | INHALATION_SPRAY | Freq: Four times a day (QID) | RESPIRATORY_TRACT | 0 refills | Status: AC

## 2019-05-22 MED ORDER — HEPARIN SOD (PORK) LOCK FLUSH 100 UNIT/ML IV SOLN
500.0000 [IU] | INTRAVENOUS | Status: AC | PRN
  Administered 2019-05-22: 500 [IU]
  Filled 2019-05-22: qty 5

## 2019-05-22 MED ORDER — ASCORBIC ACID 500 MG PO TABS: 500.0000 mg | ORAL_TABLET | Freq: Two times a day (BID) | ORAL | 0 refills | Status: AC

## 2019-05-22 MED ORDER — HEPARIN SOD (PORK) LOCK FLUSH 100 UNIT/ML IV SOLN
500.0000 [IU] | INTRAVENOUS | Status: DC
  Filled 2019-05-22: qty 5

## 2019-05-22 NOTE — Discharge Summary (Signed)
Physician Discharge Summary  Olivia Hooper [id number] DOB: 16-Nov-1940 DOA: 05/17/2019  PCP: [name], MD  Admit date: 05/17/2019 Discharge date: 05/22/2019  Admitted From: home Disposition:  Home  Recommendations for Outpatient Follow-up:  1. Follow up with PCP in 1-2 weeks 2. Please obtain BMP/CBC in one week 3. Please follow up with ID in 1 to 2 weeks.  4. Please follow up with hematology as scheduled.   Home Health: yes  Discharge Condition:STABLE.  CODE STATUS: FULL CODE.  Diet recommendation: Heart Healthy   Brief/Interim Summary: 79 year old female with history of carpal tunnel syndrome, COPD, drug-induced anemia, hypercalcemia, hyperlipidemia, hyperparathyroidism, carpal tunnel syndrome, pulmonary mycobacterial infection, rheumatoid arthritis followed by Dr. Tommy Medal in the office with infectious disease was asked to come to ED for abnormal labs.  On arrival to ED her hemoglobin was around 6.8. She tested positive for COVID-19.  Chest x-ray shows small bilateral effusions and cavitary lesion is decreasing in size.  She was admitted to [facility] and infectious disease consulted for management of her mycobacterial infection in the lung.  Hematology consulted for anemia.  Patient was on Zyvox for pulmonary bacterial mycobacterial infection.  Symptomatic anemia was thought to be secondary to Zyvox or possibly from COVID-19 viral infection.  The culprit antibiotic was stopped and she was given a total of 3 units of PRBC transfusion.  Her hemoglobin improved to 9 posttransfusion . As per RN patient has been off oxygen all night and her breathing has much improved.  As per the patient she reports she is back to baseline and has shortness of breath only on ambulation and is requiring up to 1 L of nasal cannula oxygen.  Discharge Diagnoses:  Principal Problem:   Acute respiratory disease due to COVID-19 virus Active Problems:   COPD GOLD II / still smoking    Rheumatoid arthritis ([place])    Pulmonary mycobacterial infection (HCC)   Symptomatic anemia   AKI (acute kidney injury) ([place])   Cardiomegaly   Pressure injury of skin  Acute respiratory failure with hypoxia secondary to COVID-19 viral illness Improving, no wheezing heard on exam today. She will have completed IV remdesivir by tomorrow and continue the Decadron to complete the course. Patient has been off oxygen all night and is currently requiring 1 to 2 L of oxygen only on ambulation. Continue with bronchodilators, incentive spirometry, flutter valve and self proning as appropriate. Improving CRP to 2, ferritin is 303, D-dimer is 1.8, fibrinogen is 231   Symptomatic anemia probably secondary to Zyvox versus COVID-19 illness. Anemia panel reviewed, no history of melena or rectal bleed. S/p 3 units of PRBC transfusion and hemoglobin has improved to 9.9.  Myoglobin at 9.7 today. Hematology consulted recommended to follow-up with Dr. Burr Medico in the office. ,     Pulmonary mycobacterial infection Patient was on IV Zyvox prior to admission which was discontinued at this time.  ID consulted recommended to continue watching her without antibiotics at this time.  Recommend outpatient follow-up with Dr. Tommy Medal to discuss further antibiotics.   Severe protein calorie malnutrition Dietary consulted and recommendations to be given.    COPD/ongoing smoking Wheezing has improved, continue with Combivent inhaler and albuterol as needed Continue with the flutter valve and antitussives as needed. Patient was counseled on smoking cessation by the provider yesterday. No new complaints at this time    AKI Unclear etiology, probably secondary to hypoperfusion, diuretics, anemia.  Holding torsemide today. On admission.  Patient's creatinine was at 1.95 on admission ,  improved to 1.  Bilateral pleural effusions and cardiomegaly on chest x-ray  Echocardiogram is ordered and it shows Left ventricular ejection  fraction, by estimation, is 60 to 65%. The  left ventricle has normal function. The left ventricle has no regional  wall motion abnormalities. Left ventricular diastolic parameters are  consistent with Grade I diastolic dysfunction (impaired relaxation). Elevated left ventricular end-diastolic pressure.  Improving oxygen sats.   Pressure injury present on admission:   Pressure Injury 05/19/19 Buttocks Right Stage 2 -  Partial thickness loss of dermis presenting as a shallow open injury with a red, pink wound bed without slough. (Active)  05/19/19 0400  Location: Buttocks  Location Orientation: Right  Staging: Stage 2 -  Partial thickness loss of dermis presenting as a shallow open injury with a red, pink wound bed without slough.  Wound Description (Comments):   Present on Admission: Yes   Wound care consulted and appreciate recommendations to continue with Gerhardt's butt paste twice daily.    Hypokalemia Replaced  Mild hypernatremia.  resolved.   Discharge Instructions  Discharge Instructions    Diet - low sodium heart healthy   Complete by: As directed    Discharge instructions   Complete by: As directed    Please follow up with infectious disease as recommended.  Please follow up with PCP in one week.     Allergies as of 05/22/2019      Reactions   Buprenorphine Hcl Nausea And Vomiting   Morphine And Related Nausea And Vomiting         Medication List    STOP taking these medications   fluconazole 100 MG tablet Commonly known as: DIFLUCAN   linezolid 600 MG tablet Commonly known as: ZYVOX     TAKE these medications   acetaminophen 500 MG tablet Commonly known as: TYLENOL Take 500 mg by mouth every 8 (eight) hours as needed for mild pain.   albuterol 108 (90 Base) MCG/ACT inhaler Commonly known as: VENTOLIN HFA Inhale 2 puffs into the lungs every 6 (six) hours as needed for wheezing or shortness of breath.   AMBULATORY NON FORMULARY MEDICATION Take  100 mg by mouth daily. Medication Name: clofazimine for mycobacterium abscessus pulmonary infection   ascorbic acid 500 MG tablet Commonly known as: VITAMIN C Take 1 tablet (500 mg total) by mouth 2 (two) times daily.   dexamethasone 6 MG tablet Commonly known as: DECADRON Take 1 tablet (6 mg total) by mouth daily.   Dulera 100-5 MCG/ACT Aero Generic drug: mometasone-formoterol Inhale 2 puffs into the lungs 2 (two) times daily as needed for wheezing or shortness of breath.   guaiFENesin-dextromethorphan 100-10 MG/5ML syrup Commonly known as: ROBITUSSIN DM Take 5 mLs by mouth every 4 (four) hours as needed for cough.   Ipratropium-Albuterol 20-100 MCG/ACT Aers respimat Commonly known as: COMBIVENT Inhale 2 puffs into the lungs every 6 (six) hours.   lidocaine-prilocaine cream Commonly known as: EMLA Apply 1 application topically as needed. What changed: reasons to take this   mupirocin ointment 2 % Commonly known as: BACTROBAN Apply 1 application topically 2 (two) times daily.   SYSTANE OP Place 1 drop into both eyes daily as needed (dry eyes).   torsemide 5 MG tablet Commonly known as: DEMADEX Take 5 mg by mouth daily.   traMADol 50 MG tablet Commonly known as: ULTRAM Take 50 mg by mouth 2 (two) times daily as needed for moderate pain.   zinc sulfate 220 (50 Zn) MG capsule Take 1  capsule (220 mg total) by mouth daily. Start taking on: May 23, 2019            Durable Medical Equipment  (From admission, onward)         Start     Ordered   05/22/19 1128  For home use only DME oxygen  Once    Question Answer Comment  Length of Need 12 Months   Mode or (Route) Nasal cannula   Liters per Minute 1.5   Frequency Continuous (stationary and portable oxygen unit needed)   Oxygen conserving device Yes   Oxygen delivery system Gas      05/22/19 1127          Allergies  Allergen Reactions  . Buprenorphine Hcl Nausea And Vomiting  . Morphine And Related  Nausea And Vomiting         Consultations:  ID  Hematology.    Procedures/Studies: DG Chest 2 View  Result Date: 05/17/2019 CLINICAL DATA:  Shortness of breath for 1 week. Symptomatic anemia. Weakness and dizziness. EXAM: CHEST - 2 VIEW COMPARISON:  CT 04/10/2019, radiograph 01/18/2019 FINDINGS: Small bilateral pleural effusions, new from prior exam. Normal heart size with aortic atherosclerosis. Right chest port with tip in the lower SVC. Known cavitary right upper lobe lesion has decreased soft tissue component compared to November radiograph. Additional Tree-in-bud opacities on CT are not well visualized radiographically. No pulmonary edema. Chronic compression fracture of the midthoracic spine. Bones are under mineralized. Surgical clips in the left axilla. Splenic granuloma in the upper abdomen incidentally noted. IMPRESSION: 1. Small bilateral pleural effusions, new from prior exam. 2. Known cavitary right upper lobe lesion has decreased soft tissue component compared to November radiograph, similar to CT last month. No new airspace disease. Aortic Atherosclerosis (ICD10-I70.0). Electronically Signed   By: Keith Rake M.D.   On: 05/17/2019 17:10   ECHOCARDIOGRAM COMPLETE  Result Date: 05/18/2019    ECHOCARDIOGRAM REPORT   Patient Name:   SADI WAYNER Date of Exam: 05/18/2019 Medical Rec #:  [id number]     Height:       61.5 in Accession #:    [id number]    Weight:       104.1 lb Date of Birth:  03-Nov-1940     BSA:          1.439 m Patient Age:    53 years      BP:           100/49 mmHg Patient Gender: F             HR:           77 bpm. Exam Location:  Inpatient Procedure: 2D Echo, Cardiac Doppler and Color Doppler Indications:    Cardiomegaly  History:        Patient has no prior history of Echocardiogram examinations.                 COPD, Signs/Symptoms:Shortness of Breath; Risk                 Factors:Dyslipidemia. LE edema, pleural effusion, [id number]                 Cancer, breast  implants.  Sonographer:    Dustin Flock Referring Phys: [id number] [place]  Sonographer Comments: Image acquisition challenging due to COPD and Image acquisition challenging due to breast implants. IMPRESSIONS  1. Left ventricular ejection fraction, by estimation, is 60 to 65%. The left ventricle has normal  function. The left ventricle has no regional wall motion abnormalities. Left ventricular diastolic parameters are consistent with Grade I diastolic dysfunction (impaired relaxation). Elevated left ventricular end-diastolic pressure.  2. Right ventricular systolic function is normal. The right ventricular size is normal. There is moderately elevated pulmonary artery systolic pressure.  3. Left atrial size was moderately dilated.  4. Right atrial size was mildly dilated.  5. Large pleural effusion in the left lateral region.  6. The mitral valve is normal in structure. Mild mitral valve regurgitation. No evidence of mitral stenosis.  7. The aortic valve is tricuspid. Aortic valve regurgitation is not visualized. No aortic stenosis is present.  8. The inferior vena cava is dilated in size with <50% respiratory variability, suggesting right atrial pressure of 15 mmHg. FINDINGS  Left Ventricle: Left ventricular ejection fraction, by estimation, is 60 to 65%. The left ventricle has normal function. The left ventricle has no regional wall motion abnormalities. The left ventricular internal cavity size was normal in size. There is  no left ventricular hypertrophy. Left ventricular diastolic parameters are consistent with Grade I diastolic dysfunction (impaired relaxation). Elevated left ventricular end-diastolic pressure. Right Ventricle: The right ventricular size is normal. No increase in right ventricular wall thickness. Right ventricular systolic function is normal. There is moderately elevated pulmonary artery systolic pressure. The tricuspid regurgitant velocity is 2.97 m/s, and with an assumed right  atrial pressure of 15 mmHg, the estimated right ventricular systolic pressure is 0000000 mmHg. Left Atrium: Left atrial size was moderately dilated. Right Atrium: Right atrial size was mildly dilated. Pericardium: There is no evidence of pericardial effusion. Mitral Valve: The mitral valve is normal in structure. Normal mobility of the mitral valve leaflets. Mild mitral valve regurgitation. No evidence of mitral valve stenosis. Tricuspid Valve: The tricuspid valve is normal in structure. Tricuspid valve regurgitation is mild . No evidence of tricuspid stenosis. Aortic Valve: The aortic valve is tricuspid. Aortic valve regurgitation is not visualized. No aortic stenosis is present. Pulmonic Valve: The pulmonic valve was normal in structure. Pulmonic valve regurgitation is not visualized. No evidence of pulmonic stenosis. Aorta: The aortic root is normal in size and structure. Venous: The inferior vena cava is dilated in size with less than 50% respiratory variability, suggesting right atrial pressure of 15 mmHg. IAS/Shunts: No atrial level shunt detected by color flow Doppler. Additional Comments: There is a large pleural effusion in the left lateral region. Moderate ascites is present.  LEFT VENTRICLE PLAX 2D LVIDd:         4.20 cm  Diastology LVIDs:         3.10 cm  LV e' lateral:   12.50 cm/s LV PW:         0.80 cm  LV E/e' lateral: 8.6 LV IVS:        0.80 cm  LV e' medial:    6.96 cm/s LVOT diam:     2.00 cm  LV E/e' medial:  15.4 LV SV:         73 LV SV Index:   50 LVOT Area:     3.14 cm  RIGHT VENTRICLE RV Basal diam:  2.30 cm RV S prime:     15.60 cm/s TAPSE (M-mode): 2.6 cm LEFT ATRIUM           Index       RIGHT ATRIUM           Index LA diam:      3.00 cm 2.08 cm/m  RA  Area:     16.40 cm LA Vol (A2C): 32.6 ml 22.65 ml/m RA Volume:   44.70 ml  31.05 ml/m LA Vol (A4C): 51.2 ml 35.57 ml/m  AORTIC VALVE LVOT Vmax:   109.00 cm/s LVOT Vmean:  67.700 cm/s LVOT VTI:    0.231 m  AORTA Ao Root diam: 2.30 cm MITRAL  VALVE                TRICUSPID VALVE MV Area (PHT): 3.60 cm     TR Peak grad:   35.3 mmHg MV Decel Time: 211 msec     TR Vmax:        297.00 cm/s MV E velocity: 107.00 cm/s MV A velocity: 117.00 cm/s  SHUNTS MV E/A ratio:  0.91         Systemic VTI:  0.23 m                             Systemic Diam: 2.00 cm Skeet Latch MD Electronically signed by Skeet Latch MD Signature Date/Time: 05/18/2019/1:45:22 PM    Final        Subjective: No new complaints.   Discharge Exam: Vitals:   05/22/19 0428 05/22/19 0930  BP: 116/71 (!) 141/64  Pulse: 73 71  Resp: 16 19  Temp: 98.3 F (36.8 C) 97.9 F (36.6 C)  SpO2:  96%   Vitals:   05/21/19 1254 05/21/19 1951 05/22/19 0428 05/22/19 0930  BP: 124/64 133/61 116/71 (!) 141/64  Pulse: 61 92 73 71  Resp: 16 18 16 19   Temp: 98.1 F (36.7 C) 98.4 F (36.9 C) 98.3 F (36.8 C) 97.9 F (36.6 C)  TempSrc: Oral Oral Oral Oral  SpO2: 97% (!) 86%  96%  Weight:        General: Pt is alert, awake, not in acute distress Cardiovascular: RRR, S1/S2 +, no rubs, no gallops Respiratory: CTA bilaterally, no wheezing, no rhonchi Abdominal: Soft, NT, ND, bowel sounds + Extremities: no edema, no cyanosis    The results of significant diagnostics from this hospitalization (including imaging, microbiology, ancillary and laboratory) are listed below for reference.     Microbiology: Recent Results (from the past 240 hour(s))  SARS CORONAVIRUS 2 (TAT 6-24 HRS) Nasopharyngeal Nasopharyngeal Swab     Status: Abnormal   Collection Time: 05/18/19 12:09 AM   Specimen: Nasopharyngeal Swab  Result Value Ref Range Status   SARS Coronavirus 2 POSITIVE (A) NEGATIVE Final    Comment: RESULT CALLED TO, READ BACK BY AND VERIFIED WITH: Lafonda Mosses RN 8:55 05/18/19 (wilsonm) (NOTE) SARS-CoV-2 target nucleic acids are DETECTED. The SARS-CoV-2 RNA is generally detectable in upper and lower respiratory specimens during the acute phase of infection. Positive results  are indicative of the presence of SARS-CoV-2 RNA. Clinical correlation with patient history and other diagnostic information is  necessary to determine patient infection status. Positive results do not rule out bacterial infection or co-infection with other viruses.  The expected result is Negative. Fact Sheet for Patients: SugarRoll.be Fact Sheet for Healthcare Providers: https://www.woods-mathews.com/ This test is not yet approved or cleared by the [place] FDA and  has been authorized for detection and/or diagnosis of SARS-CoV-2 by FDA under an Emergency Use Authorization (EUA). This EUA will remain  in effect (meaning this test can be used) for the  duration of the COVID-19 declaration under Section 564(b)(1) of the Act, 21 U.S.C. section 360bbb-3(b)(1), unless the authorization is terminated or revoked sooner.  Performed at [facility], [place] [address] [place], [place] [address]   Respiratory Panel by PCR     Status: None   Collection Time: 05/18/19 12:09 AM   Specimen: Nasopharyngeal Swab; Respiratory  Result Value Ref Range Status   Adenovirus NOT DETECTED NOT DETECTED Final   Coronavirus 229E NOT DETECTED NOT DETECTED Final    Comment: (NOTE) The Coronavirus on the Respiratory Panel, DOES NOT test for the novel  Coronavirus (2019 nCoV)    Coronavirus HKU1 NOT DETECTED NOT DETECTED Final   Coronavirus NL63 NOT DETECTED NOT DETECTED Final   Coronavirus OC43 NOT DETECTED NOT DETECTED Final   Metapneumovirus NOT DETECTED NOT DETECTED Final   Rhinovirus / Enterovirus NOT DETECTED NOT DETECTED Final   Influenza A NOT DETECTED NOT DETECTED Final   Influenza B NOT DETECTED NOT DETECTED Final   Parainfluenza Virus 1 NOT DETECTED NOT DETECTED Final   Parainfluenza Virus 2 NOT DETECTED NOT DETECTED Final   Parainfluenza Virus 3 NOT DETECTED NOT DETECTED Final   Parainfluenza Virus 4 NOT DETECTED NOT DETECTED Final   Respiratory  Syncytial Virus NOT DETECTED NOT DETECTED Final   Bordetella pertussis NOT DETECTED NOT DETECTED Final   Chlamydophila pneumoniae NOT DETECTED NOT DETECTED Final   Mycoplasma pneumoniae NOT DETECTED NOT DETECTED Final    Comment: Performed at [facility], [place]. [address] [place], [place] [address]     Labs: BNP (last 3 results) Recent Labs    05/17/19 1408  BNP [id number]   Basic Metabolic Panel: Recent Labs  Lab 05/18/19 0632 05/19/19 0435 05/20/19 0502 05/21/19 0456 05/22/19 1016  NA 140 140 142 146* 143  K 3.4* 3.4* 3.2* 3.8 3.6  CL 102 105 106 109 107  CO2 30 29 28 28 27   GLUCOSE 68* 133* 83 83 74  BUN 12 18 15 17 17   CREATININE 1.42* 1.56* 1.20* 1.09* 1.02*  CALCIUM 8.6* 8.2* 8.8* 8.7* 8.7*   Liver Function Tests: Recent Labs  Lab 05/17/19 1412 05/19/19 0435 05/20/19 0502 05/21/19 0456  AST 26 56* 36 33  ALT 14 19 19 20   ALKPHOS 112 101 98 81  BILITOT 0.1* 0.5 0.4 0.7  PROT 6.6 4.6* 4.9* 5.0*  ALBUMIN 2.2* 1.7* 1.7* 1.9*   No results for input(s): LIPASE, AMYLASE in the last 168 hours. No results for input(s): AMMONIA in the last 168 hours. CBC: Recent Labs  Lab 05/17/19 1412 05/18/19 0632 05/19/19 0435 05/20/19 0502 05/21/19 0456  WBC 6.4  --  5.3 6.8 6.0  HGB 6.8* 6.6* 9.9* 9.3* 9.7*  HCT 22.7* 21.3* 30.2* 29.3* 31.0*  MCV 80.5  --  82.7 84.0 85.4  PLT 222  --  141* 135* 133*   Cardiac Enzymes: No results for input(s): CKTOTAL, CKMB, CKMBINDEX, TROPONINI in the last 168 hours. BNP: Invalid input(s): POCBNP CBG: Recent Labs  Lab 05/19/19 1129 05/19/19 1726  GLUCAP 118* 155*   D-Dimer Recent Labs    05/20/19 0502 05/21/19 0456  DDIMER 1.81* 1.92*   Hgb A1c No results for input(s): HGBA1C in the last 72 hours. Lipid Profile No results for input(s): CHOL, HDL, LDLCALC, TRIG, CHOLHDL, LDLDIRECT in the last 72 hours. Thyroid function studies No results for input(s): TSH, T4TOTAL, T3FREE, THYROIDAB in the last 72 hours.  Invalid  input(s): FREET3 Anemia work up Recent Labs    05/20/19 0502 05/21/19 0456  FERRITIN 303 216   Urinalysis    Component Value Date/Time   COLORURINE YELLOW 05/18/2019 1712  APPEARANCEUR CLEAR 05/18/2019 1712   LABSPEC 1.017 05/18/2019 1712   PHURINE 5.0 05/18/2019 1712   GLUCOSEU NEGATIVE 05/18/2019 1712   HGBUR NEGATIVE 05/18/2019 1712   BILIRUBINUR NEGATIVE 05/18/2019 1712   KETONESUR NEGATIVE 05/18/2019 1712   PROTEINUR 30 (A) 05/18/2019 1712   NITRITE NEGATIVE 05/18/2019 1712   LEUKOCYTESUR NEGATIVE 05/18/2019 1712   Sepsis Labs Invalid input(s): PROCALCITONIN,  WBC,  LACTICIDVEN Microbiology Recent Results (from the past 240 hour(s))  SARS CORONAVIRUS 2 (TAT 6-24 HRS) Nasopharyngeal Nasopharyngeal Swab     Status: Abnormal   Collection Time: 05/18/19 12:09 AM   Specimen: Nasopharyngeal Swab  Result Value Ref Range Status   SARS Coronavirus 2 POSITIVE (A) NEGATIVE Final    Comment: RESULT CALLED TO, READ BACK BY AND VERIFIED WITH: Lafonda Mosses RN 8:55 05/18/19 ([email or web address]) (NOTE) SARS-CoV-2 target nucleic acids are DETECTED. The SARS-CoV-2 RNA is generally detectable in upper and lower respiratory specimens during the acute phase of infection. Positive results are indicative of the presence of SARS-CoV-2 RNA. Clinical correlation with patient history and other diagnostic information is  necessary to determine patient infection status. Positive results do not rule out bacterial infection or co-infection with other viruses.  The expected result is Negative. Fact Sheet for Patients: SugarRoll.be Fact Sheet for Healthcare Providers: https://www.woods-mathews.com/ This test is not yet approved or cleared by the [place] FDA and  has been authorized for detection and/or diagnosis of SARS-CoV-2 by FDA under an Emergency Use Authorization (EUA). This EUA will remain  in effect (meaning this test can be used) for the  duration of  the COVID-19 declaration under Section 564(b)(1) of the Act, 21 U.S.C. section 360bbb-3(b)(1), unless the authorization is terminated or revoked sooner. Performed at [facility], [place] [address], [place], [place] [address]   Respiratory Panel by PCR     Status: None   Collection Time: 05/18/19 12:09 AM   Specimen: Nasopharyngeal Swab; Respiratory  Result Value Ref Range Status   Adenovirus NOT DETECTED NOT DETECTED Final   Coronavirus 229E NOT DETECTED NOT DETECTED Final    Comment: (NOTE) The Coronavirus on the Respiratory Panel, DOES NOT test for the novel  Coronavirus (2019 nCoV)    Coronavirus HKU1 NOT DETECTED NOT DETECTED Final   Coronavirus NL63 NOT DETECTED NOT DETECTED Final   Coronavirus OC43 NOT DETECTED NOT DETECTED Final   Metapneumovirus NOT DETECTED NOT DETECTED Final   Rhinovirus / Enterovirus NOT DETECTED NOT DETECTED Final   Influenza A NOT DETECTED NOT DETECTED Final   Influenza B NOT DETECTED NOT DETECTED Final   Parainfluenza Virus 1 NOT DETECTED NOT DETECTED Final   Parainfluenza Virus 2 NOT DETECTED NOT DETECTED Final   Parainfluenza Virus 3 NOT DETECTED NOT DETECTED Final   Parainfluenza Virus 4 NOT DETECTED NOT DETECTED Final   Respiratory Syncytial Virus NOT DETECTED NOT DETECTED Final   Bordetella pertussis NOT DETECTED NOT DETECTED Final   Chlamydophila pneumoniae NOT DETECTED NOT DETECTED Final   Mycoplasma pneumoniae NOT DETECTED NOT DETECTED Final    Comment: Performed at [facility], [place]. [address] [place], [place] [address]     Time coordinating discharge: 32  minutes  SIGNED:   Hosie Poisson, MD  [facility] 05/22/2019, 11:35 AM

## 2019-05-22 NOTE — Telephone Encounter (Signed)
Correct, she couldn't afford it so I had everything held until you decide on a third option with clofazamine and cefoxitin.

## 2019-05-22 NOTE — Care Management Important Message (Signed)
Important Message  Patient Details IM Letter given to Gabriel Earing RN Case Manager to present to the Patient Name: Olivia Hooper MRN: [id number] Date of Birth: 1940/03/26   Medicare Important Message Given:  Yes     Kerin Salen 05/22/2019, 11:52 AM

## 2019-05-22 NOTE — Telephone Encounter (Signed)
We can do a phone visit this Wed at 915. There is not a rush on making antibiotic decision right now. I am looping [name] and Dr.Comer in apparently there was cost issue with omadacycline option?

## 2019-05-22 NOTE — Telephone Encounter (Signed)
Patient's daughter called office today to follow up on antibiotic concerns. States that while at the hospital patient's antibiotics were d/c. Was informed that the hospitalist were working on a different direction regarding antibiotics. Would like to speak with Dr. Tommy Medal regarding this change. Would like to do a virtual visit this week to discuss plan of care. [place]

## 2019-05-22 NOTE — Progress Notes (Signed)
Physical Therapy Treatment Patient Details Name: Olivia Hooper MRN: [id number] DOB: May 19, 1940 Today's Date: 05/22/2019    History of Present Illness 79 year old female with history of carpal tunnel syndrome, COPD, drug-induced anemia, hypercalcemia, hyperlipidemia, hyperparathyroidism, carpal tunnel syndrome, pulmonary mycobacterial infection, rheumatoid arthritis followed by Dr. Tommy Medal in the office with infectious disease was asked to come to ED for abnormal labs.  On arrival to ED her hemoglobin was around 6.8.She tested positive for COVID-19.    PT Comments    Pt assisted to [facility] and able able to perform pericare with standby assist.  Pt ambulated in room and occasionally requiring min assist for steadying if pt not using any UE support.  Recommended pt at least use RW upon d/c for safety.  Pt reports having RW (?).  Updated d/c recommendations for HHPT since pt still requiring oxygen and appears a little more unsteady today then previous session.    Follow Up Recommendations  Home health PT;Supervision - Intermittent     Equipment Recommendations  Rolling walker with 5" wheels    Recommendations for Other Services       Precautions / Restrictions Precautions Precautions: Fall    Mobility  Bed Mobility Overal bed mobility: Modified Independent                Transfers Overall transfer level: Needs assistance Equipment used: None Transfers: Sit to/from Stand Sit to Stand: Min guard         General transfer comment: verbal cues for hand placement and O2 line/IV line for safety  Ambulation/Gait Ambulation/Gait assistance: Min guard;Min assist Gait Distance (Feet): 45 Feet Assistive device: None Gait Pattern/deviations: Step-through pattern;Decreased stride length;Narrow base of support     General Gait Details: pt initially pushing IV pole and steady however with no UE support, pt requires min assist at times for steadying; pt encouraged to use RW upon d/c  home, pt states she has RW and agreeable to this for safety, remained on 2L O2 [place] (since pt requiring 1.5L at rest)   Stairs             Wheelchair Mobility    Modified Rankin (Stroke Patients Only)       Balance           Standing balance support: No upper extremity supported Standing balance-Leahy Scale: Fair                              Cognition Arousal/Alertness: Awake/alert Behavior During Therapy: WFL for tasks assessed/performed Overall Cognitive Status: Within Functional Limits for tasks assessed                                        Exercises      General Comments        Pertinent Vitals/Pain Pain Assessment: No/denies pain    Home Living                      Prior Function            PT Goals (current goals can now be found in the care plan section) Progress towards PT goals: Progressing toward goals    Frequency    Min 3X/week      PT Plan Discharge plan needs to be updated    Co-evaluation  AM-PAC PT "6 Clicks" Mobility   Outcome Measure  Help needed turning from your back to your side while in a flat bed without using bedrails?: None Help needed moving from lying on your back to sitting on the side of a flat bed without using bedrails?: None Help needed moving to and from a bed to a chair (including a wheelchair)?: A Little Help needed standing up from a chair using your arms (e.g., wheelchair or bedside chair)?: A Little Help needed to walk in hospital room?: A Little Help needed climbing 3-5 steps with a railing? : A Little 6 Click Score: 20    End of Session Equipment Utilized During Treatment: Gait belt;Oxygen Activity Tolerance: Patient tolerated treatment well Patient left: in chair;with call bell/phone within reach;with chair alarm set   PT Visit Diagnosis: Unsteadiness on feet (R26.81);Other abnormalities of gait and mobility (R26.89)     Time:  [id number] PT Time Calculation (min) (ACUTE ONLY): 16 min  Charges:  $Gait Training: 8-22 mins                    Arlyce Dice, DPT Acute Rehabilitation Services Office: [phone]  York Ram E 05/22/2019, 2:06 PM

## 2019-05-22 NOTE — Progress Notes (Signed)
Called son, Dandra Ekman, with updates and discharge plan. Informed that pt is discharged today. He continues to have questions and concerns. Sent text to Dr Karleen Hampshire to call son.

## 2019-05-22 NOTE — TOC Progression Note (Signed)
Transition of Care [facility]) - Progression Note    Patient Details  Name: Olivia Hooper MRN: [id number] Date of Birth: [birth year]  Transition of Care [facility]) CM/SW Contact  Purcell Mouton, RN Phone Number: 05/22/2019, 2:49 PM  Clinical Narrative:    Pt discharging home with [facility]. Pt was active with [facility]. In house rep is aware.         Expected Discharge Plan and Services           Expected Discharge Date: 05/22/19                                     Social Determinants of Health (SDOH) Interventions    Readmission Risk Interventions No flowsheet data found.

## 2019-05-22 NOTE — TOC Progression Note (Signed)
Transition of Care [facility]) - Progression Note    Patient Details  Name: Olivia Hooper MRN: [id number] Date of Birth: 1940/08/10  [facility]) CM/SW Contact  Purcell Mouton, RN Phone Number: 05/22/2019, 11:38 AM  Clinical Narrative:    Pt's Sats on room air at rest 86%.        Expected Discharge Plan and Services           Expected Discharge Date: 05/22/19                                     Social Determinants of Health (SDOH) Interventions    Readmission Risk Interventions No flowsheet data found.

## 2019-05-23 ENCOUNTER — Telehealth: Payer: Self-pay | Admitting: Hematology

## 2019-05-23 ENCOUNTER — Telehealth: Payer: Self-pay | Admitting: Nurse Practitioner

## 2019-05-23 NOTE — Telephone Encounter (Signed)
Rescheduled per msg. Called and spoke with pt, confirmed 3/24 appt

## 2019-05-23 NOTE — Telephone Encounter (Signed)
Rob you were thinking of Merrem. Tigecycline might be another option. [name] any thoughts. I dont mind restarting her antibiotics since they are so anxious for her to be on a combination of treatments. I imagine there is no way we can work [employer] to get omadacycyline the way we did for my medicare pt with C diff and mx allergies withouth such prohibitive cost?

## 2019-05-23 NOTE — Telephone Encounter (Signed)
Her PA is approved until March 2022, but she is in catastrophic right now with her Medicare so the copay is $1500 per month right now. There is a chance she will come out of it eventually and her copay will go down. It is "off label" use so there are no assistance funds available to help with the cost. So this MAY eventually be an option. Imipenem is intermediate but we could try it. Tigecycline is also an option but probably won't last long. Do we think her anemia was related to Linezolid? It is rare and would surprise me but I guess we can't rule it out. We could try tedizolid which has less side effects and issues but it may end up being the same and cost a lot as well. This is a tough one!

## 2019-05-23 NOTE — Telephone Encounter (Signed)
Scheduled per staff msg. Called and spoke with pt, confirmed 3/22 appt.

## 2019-05-24 ENCOUNTER — Other Ambulatory Visit: Payer: Self-pay

## 2019-05-24 ENCOUNTER — Ambulatory Visit (INDEPENDENT_AMBULATORY_CARE_PROVIDER_SITE_OTHER): Payer: Medicare Other | Admitting: Infectious Disease

## 2019-05-24 ENCOUNTER — Encounter: Payer: Self-pay | Admitting: Infectious Disease

## 2019-05-24 VITALS — Ht 60.5 in | Wt 96.0 lb

## 2019-05-24 DIAGNOSIS — D6489 Other specified anemias: Secondary | ICD-10-CM | POA: Diagnosis not present

## 2019-05-24 DIAGNOSIS — A310 Pulmonary mycobacterial infection: Secondary | ICD-10-CM | POA: Diagnosis not present

## 2019-05-24 DIAGNOSIS — J069 Acute upper respiratory infection, unspecified: Secondary | ICD-10-CM

## 2019-05-24 DIAGNOSIS — N179 Acute kidney failure, unspecified: Secondary | ICD-10-CM

## 2019-05-24 DIAGNOSIS — M05732 Rheumatoid arthritis with rheumatoid factor of left wrist without organ or systems involvement: Secondary | ICD-10-CM

## 2019-05-24 DIAGNOSIS — U071 COVID-19: Secondary | ICD-10-CM | POA: Diagnosis not present

## 2019-05-24 DIAGNOSIS — R5383 Other fatigue: Secondary | ICD-10-CM | POA: Diagnosis not present

## 2019-05-24 DIAGNOSIS — M05731 Rheumatoid arthritis with rheumatoid factor of right wrist without organ or systems involvement: Secondary | ICD-10-CM

## 2019-05-24 DIAGNOSIS — J449 Chronic obstructive pulmonary disease, unspecified: Secondary | ICD-10-CM | POA: Diagnosis not present

## 2019-05-24 NOTE — Telephone Encounter (Signed)
Agree.. she would need very close follow up too with her hemoglobin/platelets which I can definitely do!  If they decide to proceed with treatment.

## 2019-05-24 NOTE — Progress Notes (Signed)
Virtual Visit via Telephone Note  I connected with Olivia Hooper on 05/24/19 at  9:15 AM EDT by telephone and verified that I am speaking with the correct person using two identifiers.  Location: Patient: Home Provider: [facility]   I discussed the limitations, risks, security and privacy concerns of performing an evaluation and management service by telephone and the availability of in person appointments. I also discussed with the patient that there may be a patient responsible charge related to this service. The patient expressed understanding and agreed to proceed.   History of Present Illness:  79 year old Caucasian lady with history of tobacco use chronic obstructive pulmonary disease severe rheumatoid arthritis, left breast cancer who has been on immunosuppressive therapy.  She apparently had a 1 month history of hemoptysis.  X-ray had shown a right upper lobe lung mass CT had shown 6.5 x 5.2 x 7.47 manner cavitary mass in the right upper lobe.  There are multiple small lung nodules as well.  There is also a small anterior mediastinal mass.  Patient underwent bronchoscopy with Dr. Tamala Julian with cytology is AFB fungal stains and cultures.  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  In the interval she had a PET CT scan that showed the cavitary mass which was very hypermetabolic patchy tree-in-bud opacities in both lungs there were hypermetabolic and a 3.9 1.6 cm anterior mediastinal soft tissue mass that was hypermetabolic.  There was anxiety about patient potentially having a thymoma but at present she was felt to be a high risk surgical patient when seen by Dr. Salomon Fick with cardiothoracic surgery.  I also agree with him that this mediastinal mass could be due to the mycobacterial abscessus infection.  These organisms are not notoriously difficult to treat still to have some anxiety the patient might need some surgical intervention to gain control of her  chest pathology.  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  She had lost 10 pounds of weight over the last half year.  She has stopped smoking now.  We crafted a regimen to treat her Mycobacterium abscessus with once daily clofazimine twice daily Zyvox and IV cefoxitin via continuous infusion. When I saw her in late February she was 5 weeks into treatment.  She has been feeling progressively more fatigued and sleeping excessively according to both her and her son who accompanied her.  I noticed that she had dropped her hemoglobin down into the range of 7.4 after being 10.  I was concerned that Zyvox might have been causing some myelosuppression though her platelets had not dropped and we dropped her Zyvox dose to once daily.  In the interim she experienced worsening fatigue and anemia and was admitted hospital.  Her hemoglobin had dropped below 7 into the 6 range.  She was found to have COVID-19 and was treated with steroids and remdesivir her antibiotics were stopped altogether.  She was seen by my partner Dr. Linus Salmons and and he an alternate regimen was considered including omadacycline.  Unfortunately even omadacycline was approved her cost for drugs was still 7000 due to the nature of her insurance.  Crafting of a new regimen was deferred until she was in the outpatient world.  She is currently at home recovering from COVID-19 and not on antibiotics for Mycobacterium abscessus infection.  Chest x-ray did show diminution in the soft tissue component of her cavity in her lung.  Past Medical History:  Diagnosis Date  . Anemia 05/03/2019  . Breast cancer ([place])    on left  . Carpal tunnel syndrome   . COPD (chronic obstructive pulmonary disease) ([place])   . Drug-induced anemia 05/03/2019  . Dyspnea   . Hypercalcemia   . Hyperlipidemia   . Hyperparathyroidism ([place])   .  Osteoporosis   . Pneumonia   . Pulmonary mycobacterial infection ([place]) 03/14/2019  . RA (rheumatoid arthritis) ([place])   . Sleeping excessive 05/03/2019  . Vertigo   . Vitamin D deficiency     Past Surgical History:  Procedure Laterality Date  . CARPAL TUNNEL RELEASE    . CATARACT EXTRACTION    . IR IMAGING GUIDED PORT INSERTION  04/05/2019  . MASTECTOMY Left   . VESICOVAGINAL FISTULA CLOSURE W/ TAH    . VIDEO BRONCHOSCOPY WITH ENDOBRONCHIAL ULTRASOUND N/A 01/18/2019   Procedure: VIDEO BRONCHOSCOPY WITH ENDOBRONCHIAL ULTRASOUND;  Surgeon: Candee Furbish, MD;  Location: [facility] OR;  Service: Thoracic;  Laterality: N/A;    Family History  Problem Relation Age of Onset  . Heart disease Father   . Bone cancer Father   . Emphysema Father        never smoker  . Heart disease Mother   . Colon cancer Mother   . Melanoma Sister   . Kidney cancer Brother       Social History   Socioeconomic History  . Marital status: Widowed    Spouse name: Not on file  . Number of children: Not on file  . Years of education: Not on file  . Highest education level: Not on file  Occupational History  . Occupation: Retired   Tobacco Use  . Smoking status: Former Smoker    Packs/day: 0.25    Years: 62.00    Pack years: 15.50    Types: Cigarettes    Quit date: 01/12/2019    Years since quitting: 0.3  . Smokeless tobacco: Never Used  Substance and Sexual Activity  . Alcohol use: No    Alcohol/week: 0.0 standard drinks  . Drug use: No  . Sexual activity: Not on file  Other Topics Concern  . Not on file  Social History Narrative  . Not on file   Social Determinants of Health   Financial Resource Strain:   . Difficulty of Paying Living Expenses:   Food Insecurity:   . Worried About Charity fundraiser in the Last Year:   . Arboriculturist in the Last Year:   Transportation Needs:   . Film/video editor (Medical):   Marland Kitchen Lack of Transportation (Non-Medical):   Physical Activity:   . Days  of Exercise per Week:   . Minutes of Exercise per Session:   Stress:   . Feeling of Stress :   Social Connections:   . Frequency of Communication with Friends and Family:   . Frequency of Social Gatherings with Friends and Family:   . Attends Religious Services:   . Active Member of Clubs or Organizations:   . Attends Archivist Meetings:   Marland Kitchen Marital Status:     Allergies  Allergen Reactions  . Buprenorphine Hcl Nausea And Vomiting  . Morphine And Related Nausea And Vomiting          Current Outpatient Medications:  .  acetaminophen (TYLENOL) 500 MG tablet, Take 500 mg by mouth every 8 (eight) hours as needed for mild pain. , Disp: , Rfl:  .  albuterol (VENTOLIN  HFA) 108 (90 Base) MCG/ACT inhaler, Inhale 2 puffs into the lungs every 6 (six) hours as needed for wheezing or shortness of breath. , Disp: , Rfl:  .  AMBULATORY NON FORMULARY MEDICATION, Take 100 mg by mouth daily. Medication Name: clofazimine for mycobacterium abscessus pulmonary infection, Disp: 100 capsule, Rfl: 2 .  lidocaine-prilocaine (EMLA) cream, Apply 1 application topically as needed. (Patient taking differently: Apply 1 application topically as needed (access port). ), Disp: 30 g, Rfl: 0 .  mometasone-formoterol (DULERA) 100-5 MCG/ACT AERO, Inhale 2 puffs into the lungs 2 (two) times daily as needed for wheezing or shortness of breath. , Disp: , Rfl:  .  mupirocin ointment (BACTROBAN) 2 %, Apply 1 application topically 2 (two) times daily., Disp: 30 g, Rfl: 2 .  Polyethyl Glycol-Propyl Glycol (SYSTANE OP), Place 1 drop into both eyes daily as needed (dry eyes)., Disp: , Rfl:  .  traMADol (ULTRAM) 50 MG tablet, Take 50 mg by mouth 2 (two) times daily as needed for moderate pain. , Disp: , Rfl:  .  ascorbic acid (VITAMIN C) 500 MG tablet, Take 1 tablet (500 mg total) by mouth 2 (two) times daily. (Patient not taking: Reported on 05/24/2019), Disp: 30 tablet, Rfl: 0 .  dexamethasone (DECADRON) 6 MG tablet,  Take 1 tablet (6 mg total) by mouth daily. (Patient not taking: Reported on 05/24/2019), Disp: 5 tablet, Rfl: 0 .  guaiFENesin-dextromethorphan (ROBITUSSIN DM) 100-10 MG/5ML syrup, Take 5 mLs by mouth every 4 (four) hours as needed for cough. (Patient not taking: Reported on 05/24/2019), Disp: 118 mL, Rfl: 0 .  Ipratropium-Albuterol (COMBIVENT) 20-100 MCG/ACT AERS respimat, Inhale 2 puffs into the lungs every 6 (six) hours., Disp: 4 g, Rfl: 0 .  torsemide (DEMADEX) 5 MG tablet, Take 5 mg by mouth daily., Disp: , Rfl:  .  zinc sulfate 220 (50 Zn) MG capsule, Take 1 capsule (220 mg total) by mouth daily. (Patient not taking: Reported on 05/24/2019), Disp:  , Rfl:     Observations/Objective:  Ms. Leonardis seems to be recovering from COVID-19 still on supplemental oxygen.  She is coughing from time to time.  She is not on immunosuppressive therapy and is not on antibiotics for Mycobacterium abscessus infection  Assessment and Plan:  Mycobacterium abscessus pulmonary infection: She is still recovering from COVID-19 I think we can revisit her next week I will schedule her with our infectious these pharmacist [name] when I am also going to be in clinic.  Note after the visit the patient's son called and had a further discussion with me.  He emphasized that her being on the continuous cefoxitin has really limited her quality of life.  He asked a fair amount of questions about how long the patient could live if her Mycobacterium abscessus infection was not treated.    Options to treat her could include rechallenge with linezolid, challenge with tedezolid + cefoxitin and clofazamine, versus going to IV tigecycline with continued IV cefoxitin and oral clofazimine versus going to IV amikacin with IV cefoxitin clofazimine versus going to IV meropenem plus cefoxitin clofazimine.  I have a lot of apprehension about risk of amikacin toxicity.  Hearing the patient is not having much of a quality of life with  intravenous antibiotics that would be nice to go to [place] oral antibiotic regimen for her but we are quite a bit hampered at present time.    Follow Up Instructions:    I discussed the assessment and treatment plan with the patient. The  patient was provided an opportunity to ask questions and all were answered. The patient agreed with the plan and demonstrated an understanding of the instructions.   The patient was advised to call back or seek an in-person evaluation if the symptoms worsen or if the condition fails to improve as anticipated.  I provided 35 minutes of non-face-to-face time during this encounter.   Alcide Evener, MD

## 2019-05-24 NOTE — Telephone Encounter (Addendum)
[facility] Patient Advocate Encounter  The insurance has approved the patient's medication until 05/2020 but the copayment amount is currently $1550 per month due to her being in the catastrophic coverage gap. We can periodically monitor in the event she comes out of the coverage gap what her new copay amount will be.  There are two grant foundations that cover copays related to Nontuberculous Mycobacterial Lung Disease for medicare patients but unfortunately Elesa Hacker is not on the list of covered medications. [facility] will only give the patient 15 days of medication.  The [place] must have a diagnosis code of A31.0 and a medication from this list: (the grant will cover for a time period) [place] Azithromycin Biaxin  [place] ($2000 award level) and one of these medications: Amikacin Amikacin Intravenous [place] Avelox Cetraxal Cipro Ciprofloxacin Clarithromycin Clofazimine Coly-mycin M Cycloserine Doxycycline Erythromycin Ethambutol Floxin Gatifloxacin Isoniazid Moxeza Moxyfloxacin Myambutol Mycobutin Ocuflox Ofloxacin Priftin Rifabutin Rifadin Rifampin Rifapentine Sirturo Streptomycin Tigecycline Tygacil Vigamox Zithromax Zymaxid  Patient [place] $5000 award per year. Patient must have Medicare coverage and be 400% or less [employer]. The disease state covered is Pulmonary Fibrosis

## 2019-05-24 NOTE — Telephone Encounter (Signed)
Olivia Hooper made her appointment with you via phone call next Wednesday the 24th.  She is still recovering from Covid and was not yet ready to restart medications.  The son to call me later was potentially interested in rechallenging with linezolid or tedezolid.  He was telling me that the IV therapy was empirically like that ago when he was infusions.  Not sure what the best route forward is here.  Sounds like they are even contemplating not treating this knowing that ultimately this will progress.  It seems to have responded radiographically based on recent chest x-rays

## 2019-05-24 NOTE — Telephone Encounter (Signed)
Thanks so much [place]!

## 2019-05-24 NOTE — Telephone Encounter (Signed)
Ok no problem, sounds good. I will discuss with them next Wednesday and with you at that time! I may be in favor of re-challenging with Tedizolid. I feel like tigecycline is not going to go well for her unfortunately.

## 2019-05-24 NOTE — Telephone Encounter (Signed)
I would be in favor of that. It would be nice if we could get to an oral therapy for her but I dont think we will get there very easily or quickly

## 2019-05-29 ENCOUNTER — Other Ambulatory Visit: Payer: Medicare Other

## 2019-05-29 ENCOUNTER — Encounter: Payer: Medicare Other | Admitting: Hematology

## 2019-05-30 ENCOUNTER — Telehealth: Payer: Self-pay | Admitting: *Deleted

## 2019-05-30 ENCOUNTER — Other Ambulatory Visit: Payer: Self-pay | Admitting: Hematology

## 2019-05-30 ENCOUNTER — Other Ambulatory Visit: Payer: Self-pay | Admitting: *Deleted

## 2019-05-30 DIAGNOSIS — D649 Anemia, unspecified: Secondary | ICD-10-CM

## 2019-05-30 NOTE — Patient Outreach (Signed)
Telephone outreach for [facility] [place]. PT HAS QUESTIONS ON MEDICATIONS:  Spoke with [name] this afternoon. She seems a bit overwhelmed with her chronic and acute illness issues. She is not entirely confident about the meds she should be taking and if she has all the meds she should be taking.  We attempted to do a medication reconciliation and there were some discrepancies/problems.   1)Pt DOES NOT have dexamethasone.(? Was this just a one time, short course tx?)  2)Pt has SYMBICORT but cannot manage the twist              delivery method and does not take it.  3)Pt states she has Dulera, must be from the hospital because she has not picked up this Rx from her pharmacy. Called [facility] to verify.  4)Torsemide 5mg  is new to her since last month, however, she is reporting significant edema from mid calf down which leaves and indentation if she pushes her finger down on the skin. She had a slightly elevated Creatinine upon hospital admission was 1.95 and improved to 1.09 on discharge.  I am concerned that her gross edema may be a sign of impending HF on top of her other respiratory affecting problems.  Have instructed pt to take another 5 mg now and in am take a total of 10 mg.   She is to have a pharmacy consult with ID tomorrow. Have left a message for Ms. Kuppelweiser regarding medication concerns. Priority edema vs CKD vs anemia. Asking for collaboration.  Advised pt of [facility] in [place] where she can order knee high stockings with minimal to moderate support for improved ability to wear. She cannot wear the ones she has as her RA in her hands prevents her from being able to get them on.  Pt has agreed to [place] Management and I will be in close contact to resolve her medication issues and follow her acute and chronic problems.  Will call on Friday.  Olivia Hooper. Olivia Neither, MSN, [facility] Gerontological Nurse Practitioner [facility] Care Management [phone]

## 2019-05-30 NOTE — Progress Notes (Deleted)
[place]  [phone] Fax:([phone] [facility] consult Note   Patient Care Team: [name], MD as PCP - General (Internal Medicine) Deloria Lair, NP as [facility] 05/30/2019  CHIEF COMPLAINTS/PURPOSE OF CONSULTATION:  ***  HISTORY OF PRESENTING ILLNESS:  Olivia Hooper 79 y.o. female is here because of ***  ***She was found to have abnormal CBC from *** ***She denies recent chest pain on exertion, shortness of breath on minimal exertion, pre-syncopal episodes, or palpitations. ***She had not noticed any recent bleeding such as epistaxis, hematuria or hematochezia ***The patient denies over the counter NSAID ingestion. She is not *** on antiplatelets agents. Her last colonoscopy was *** ***She had no prior history or diagnosis of cancer. Her age appropriate screening programs are up-to-date. ***She denies any pica and eats a variety of diet. ***She never donated blood or received blood transfusion ***The patient was prescribed oral iron supplements and she takes ***  MEDICAL HISTORY:  Past Medical History:  Diagnosis Date  . Anemia 05/03/2019  . Breast cancer ([place])    on left  . Carpal tunnel syndrome   . COPD (chronic obstructive pulmonary disease) ([place])   . Drug-induced anemia 05/03/2019  . Dyspnea   . Hypercalcemia   . Hyperlipidemia   . Hyperparathyroidism ([place])   . Osteoporosis   . Pneumonia   . 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 RA (rheumatoid arthritis) ([place])   . Sleeping excessive 05/03/2019  . Vertigo   . Vitamin D deficiency     SURGICAL HISTORY: Past Surgical History:  Procedure Laterality Date  . CARPAL TUNNEL RELEASE    . CATARACT EXTRACTION    . IR IMAGING GUIDED PORT INSERTION  04/05/2019  . MASTECTOMY Left   . VESICOVAGINAL FISTULA CLOSURE W/ TAH    . VIDEO BRONCHOSCOPY WITH ENDOBRONCHIAL ULTRASOUND N/A 01/18/2019   Procedure: VIDEO BRONCHOSCOPY WITH  ENDOBRONCHIAL ULTRASOUND;  Surgeon: Candee Furbish, MD;  Location: [facility];  Service: Thoracic;  Laterality: N/A;    SOCIAL HISTORY: Social History   Socioeconomic History  . Marital status: Widowed    Spouse name: Not on file  . Number of children: Not on file  . Years of education: Not on file  . Highest education level: Not on file  Occupational History  . Occupation: Retired   Tobacco Use  . Smoking status: Former Smoker    Packs/day: 0.25    Years: 62.00    Pack years: 15.50    Types: Cigarettes    Quit date: 01/12/2019    Years since quitting: 0.3  . Smokeless tobacco: Never Used  Substance and Sexual Activity  . Alcohol use: No    Alcohol/week: 0.0 standard drinks  . Drug use: No  . Sexual activity: Not on file  Other Topics Concern  . Not on file  Social History Narrative  . Not on file   Social Determinants of Health   Financial Resource Strain:   . Difficulty of Paying Living Expenses:   Food Insecurity:   . Worried About Charity fundraiser in the Last Year:   . Arboriculturist in the Last Year:   Transportation Needs:   . Film/video editor (Medical):   Marland Kitchen Lack of Transportation (Non-Medical):   Physical Activity:   . Days of Exercise per Week:   . Minutes of Exercise per Session:   Stress:   . Feeling of Stress :   Social Connections:   .  Frequency of Communication with Friends and Family:   . Frequency of Social Gatherings with Friends and Family:   . Attends Religious Services:   . Active Member of Clubs or Organizations:   . Attends Archivist Meetings:   Marland Kitchen Marital Status:   Intimate Partner Violence:   . Fear of Current or Ex-Partner:   . Emotionally Abused:   Marland Kitchen Physically Abused:   . Sexually Abused:     FAMILY HISTORY: Family History  Problem Relation Age of Onset  . Heart disease Father   . Bone cancer Father   . Emphysema Father        never smoker  . Heart disease Mother   . Colon cancer Mother   . Melanoma Sister    . Kidney cancer Brother     ALLERGIES:  is allergic to buprenorphine hcl and morphine and related.  MEDICATIONS:  Current Outpatient Medications  Medication Sig Dispense Refill  . acetaminophen (TYLENOL) 500 MG tablet Take 500 mg by mouth every 8 (eight) hours as needed for mild pain.     Marland Kitchen albuterol (VENTOLIN HFA) 108 (90 Base) MCG/ACT inhaler Inhale 2 puffs into the lungs every 6 (six) hours as needed for wheezing or shortness of breath.     . AMBULATORY NON FORMULARY MEDICATION Take 100 mg by mouth daily. Medication Name: clofazimine for mycobacterium abscessus pulmonary infection (Patient not taking: Reported on 05/30/2019) 100 capsule 2  . ascorbic acid (VITAMIN C) 500 MG tablet Take 1 tablet (500 mg total) by mouth 2 (two) times daily. 30 tablet 0  . dexamethasone (DECADRON) 6 MG tablet Take 1 tablet (6 mg total) by mouth daily. (Patient not taking: Reported on 05/24/2019) 5 tablet 0  . guaiFENesin-dextromethorphan (ROBITUSSIN DM) 100-10 MG/5ML syrup Take 5 mLs by mouth every 4 (four) hours as needed for cough. 118 mL 0  . Ipratropium-Albuterol (COMBIVENT) 20-100 MCG/ACT AERS respimat Inhale 2 puffs into the lungs every 6 (six) hours. (Patient not taking: Reported on 05/30/2019) 4 g 0  . lidocaine-prilocaine (EMLA) cream Apply 1 application topically as needed. (Patient taking differently: Apply 1 application topically as needed (access port). ) 30 g 0  . mometasone-formoterol (DULERA) 100-5 MCG/ACT AERO Inhale 2 puffs into the lungs 2 (two) times daily as needed for wheezing or shortness of breath.     . mupirocin ointment (BACTROBAN) 2 % Apply 1 application topically 2 (two) times daily. (Patient not taking: Reported on 05/30/2019) 30 g 2  . Polyethyl Glycol-Propyl Glycol (SYSTANE OP) Place 1 drop into both eyes daily as needed (dry eyes).    . torsemide (DEMADEX) 5 MG tablet Take 5 mg by mouth daily.    . traMADol (ULTRAM) 50 MG tablet Take 50 mg by mouth 2 (two) times daily as needed for  moderate pain.     Marland Kitchen zinc sulfate 220 (50 Zn) MG capsule Take 1 capsule (220 mg total) by mouth daily.     No current facility-administered medications for this visit.    REVIEW OF SYSTEMS:   Constitutional: Denies fevers, chills or abnormal night sweats Eyes: Denies blurriness of vision, double vision or watery eyes Ears, nose, mouth, throat, and face: Denies mucositis or sore throat Respiratory: Denies cough, dyspnea or wheezes Cardiovascular: Denies palpitation, chest discomfort or lower extremity swelling Gastrointestinal:  Denies nausea, heartburn or change in bowel habits Skin: Denies abnormal skin rashes Lymphatics: Denies new lymphadenopathy or easy bruising Neurological:Denies numbness, tingling or new weaknesses Behavioral/Psych: Mood is stable, no new changes  All other systems were reviewed with the patient and are negative.  PHYSICAL EXAMINATION: ECOG PERFORMANCE STATUS: {CHL ONC ECOG [id number]}  There were no vitals filed for this visit. There were no vitals filed for this visit.  GENERAL:alert, no distress and comfortable SKIN: skin color, texture, turgor are normal, no rashes or significant lesions EYES: normal, conjunctiva are pink and non-injected, sclera clear OROPHARYNX:no exudate, no erythema and lips, buccal mucosa, and tongue normal  NECK: supple, thyroid normal size, non-tender, without nodularity LYMPH:  no palpable lymphadenopathy in the cervical, axillary or inguinal LUNGS: clear to auscultation and percussion with normal breathing effort HEART: regular rate & rhythm and no murmurs and no lower extremity edema ABDOMEN:abdomen soft, non-tender and normal bowel sounds Musculoskeletal:no cyanosis of digits and no clubbing  PSYCH: alert & oriented x 3 with fluent speech NEURO: no focal motor/sensory deficits  LABORATORY DATA:  I have reviewed the data as listed CBC Latest Ref Rng & Units 05/21/2019 05/20/2019 05/19/2019  WBC 4.0 - 10.5 K/uL 6.0 6.8 5.3   Hemoglobin 12.0 - 15.0 g/dL 9.7(L) 9.3(L) 9.9(L)  Hematocrit 36.0 - 46.0 % 31.0(L) 29.3(L) 30.2(L)  Platelets 150 - 400 K/uL 133(L) 135(L) 141(L)    CMP Latest Ref Rng & Units 05/22/2019 05/21/2019 05/20/2019  Glucose 70 - 99 mg/dL 74 83 83  BUN 8 - 23 mg/dL 17 17 15   Creatinine 0.44 - 1.00 mg/dL 1.02(H) 1.09(H) 1.20(H)  Sodium 135 - 145 mmol/L 143 146(H) 142  Potassium 3.5 - 5.1 mmol/L 3.6 3.8 3.2(L)  Chloride 98 - 111 mmol/L 107 109 106  CO2 22 - 32 mmol/L 27 28 28   Calcium 8.9 - 10.3 mg/dL 8.7(L) 8.7(L) 8.8(L)  Total Protein 6.5 - 8.1 g/dL - 5.0(L) 4.9(L)  Total Bilirubin 0.3 - 1.2 mg/dL - 0.7 0.4  Alkaline Phos 38 - 126 U/L - 81 98  AST 15 - 41 U/L - 33 36  ALT 0 - 44 U/L - 20 19     RADIOGRAPHIC STUDIES: I have personally reviewed the radiological images as listed and agreed with the findings in the report. DG Chest 2 View  Result Date: 05/17/2019 CLINICAL DATA:  Shortness of breath for 1 week. Symptomatic anemia. Weakness and dizziness. EXAM: CHEST - 2 VIEW COMPARISON:  CT 04/10/2019, radiograph 01/18/2019 FINDINGS: Small bilateral pleural effusions, new from prior exam. Normal heart size with aortic atherosclerosis. Right chest port with tip in the lower SVC. Known cavitary right upper lobe lesion has decreased soft tissue component compared to November radiograph. Additional Tree-in-bud opacities on CT are not well visualized radiographically. No pulmonary edema. Chronic compression fracture of the midthoracic spine. Bones are under mineralized. Surgical clips in the left axilla. Splenic granuloma in the upper abdomen incidentally noted. IMPRESSION: 1. Small bilateral pleural effusions, new from prior exam. 2. Known cavitary right upper lobe lesion has decreased soft tissue component compared to November radiograph, similar to CT last month. No new airspace disease. Aortic Atherosclerosis (ICD10-I70.0). Electronically Signed   By: Keith Rake M.D.   On: 05/17/2019 17:10    ECHOCARDIOGRAM COMPLETE  Result Date: 05/18/2019    ECHOCARDIOGRAM REPORT   Patient Name:   LAJADA MUNIR Date of Exam: 05/18/2019 Medical Rec #:  [id number]     Height:       61.5 in Accession #:    [id number]    Weight:       104.1 lb Date of Birth:  1940-10-08     BSA:  1.439 m Patient Age:    26 years      BP:           100/49 mmHg Patient Gender: F             HR:           77 bpm. Exam Location:  Inpatient Procedure: 2D Echo, Cardiac Doppler and Color Doppler Indications:    Cardiomegaly  History:        Patient has no prior history of Echocardiogram examinations.                 COPD, Signs/Symptoms:Shortness of Breath; Risk                 Factors:Dyslipidemia. LE edema, pleural effusion, [id number]                 Cancer, breast implants.  Sonographer:    Dustin Flock Referring Phys: [id number] [place]  Sonographer Comments: Image acquisition challenging due to COPD and Image acquisition challenging due to breast implants. IMPRESSIONS  1. Left ventricular ejection fraction, by estimation, is 60 to 65%. The left ventricle has normal function. The left ventricle has no regional wall motion abnormalities. Left ventricular diastolic parameters are consistent with Grade I diastolic dysfunction (impaired relaxation). Elevated left ventricular end-diastolic pressure.  2. Right ventricular systolic function is normal. The right ventricular size is normal. There is moderately elevated pulmonary artery systolic pressure.  3. Left atrial size was moderately dilated.  4. Right atrial size was mildly dilated.  5. Large pleural effusion in the left lateral region.  6. The mitral valve is normal in structure. Mild mitral valve regurgitation. No evidence of mitral stenosis.  7. The aortic valve is tricuspid. Aortic valve regurgitation is not visualized. No aortic stenosis is present.  8. The inferior vena cava is dilated in size with <50% respiratory variability, suggesting right atrial pressure of  15 mmHg. FINDINGS  Left Ventricle: Left ventricular ejection fraction, by estimation, is 60 to 65%. The left ventricle has normal function. The left ventricle has no regional wall motion abnormalities. The left ventricular internal cavity size was normal in size. There is  no left ventricular hypertrophy. Left ventricular diastolic parameters are consistent with Grade I diastolic dysfunction (impaired relaxation). Elevated left ventricular end-diastolic pressure. Right Ventricle: The right ventricular size is normal. No increase in right ventricular wall thickness. Right ventricular systolic function is normal. There is moderately elevated pulmonary artery systolic pressure. The tricuspid regurgitant velocity is 2.97 m/s, and with an assumed right atrial pressure of 15 mmHg, the estimated right ventricular systolic pressure is 0000000 mmHg. Left Atrium: Left atrial size was moderately dilated. Right Atrium: Right atrial size was mildly dilated. Pericardium: There is no evidence of pericardial effusion. Mitral Valve: The mitral valve is normal in structure. Normal mobility of the mitral valve leaflets. Mild mitral valve regurgitation. No evidence of mitral valve stenosis. Tricuspid Valve: The tricuspid valve is normal in structure. Tricuspid valve regurgitation is mild . No evidence of tricuspid stenosis. Aortic Valve: The aortic valve is tricuspid. Aortic valve regurgitation is not visualized. No aortic stenosis is present. Pulmonic Valve: The pulmonic valve was normal in structure. Pulmonic valve regurgitation is not visualized. No evidence of pulmonic stenosis. Aorta: The aortic root is normal in size and structure. Venous: The inferior vena cava is dilated in size with less than 50% respiratory variability, suggesting right atrial pressure of 15 mmHg. IAS/Shunts: No atrial level shunt detected by color  flow Doppler. Additional Comments: There is a large pleural effusion in the left lateral region. Moderate ascites  is present.  LEFT VENTRICLE PLAX 2D LVIDd:         4.20 cm  Diastology LVIDs:         3.10 cm  LV e' lateral:   12.50 cm/s LV PW:         0.80 cm  LV E/e' lateral: 8.6 LV IVS:        0.80 cm  LV e' medial:    6.96 cm/s LVOT diam:     2.00 cm  LV E/e' medial:  15.4 LV SV:         73 LV SV Index:   50 LVOT Area:     3.14 cm  RIGHT VENTRICLE RV Basal diam:  2.30 cm RV S prime:     15.60 cm/s TAPSE (M-mode): 2.6 cm LEFT ATRIUM           Index       RIGHT ATRIUM           Index LA diam:      3.00 cm 2.08 cm/m  RA Area:     16.40 cm LA Vol (A2C): 32.6 ml 22.65 ml/m RA Volume:   44.70 ml  31.05 ml/m LA Vol (A4C): 51.2 ml 35.57 ml/m  AORTIC VALVE LVOT Vmax:   109.00 cm/s LVOT Vmean:  67.700 cm/s LVOT VTI:    0.231 m  AORTA Ao Root diam: 2.30 cm MITRAL VALVE                TRICUSPID VALVE MV Area (PHT): 3.60 cm     TR Peak grad:   35.3 mmHg MV Decel Time: 211 msec     TR Vmax:        297.00 cm/s MV E velocity: 107.00 cm/s MV A velocity: 117.00 cm/s  SHUNTS MV E/A ratio:  0.91         Systemic VTI:  0.23 m                             Systemic Diam: 2.00 cm Skeet Latch MD Electronically signed by Skeet Latch MD Signature Date/Time: 05/18/2019/1:45:22 PM    Final     ASSESSMENT & PLAN:  No problem-specific Assessment & Plan notes found for this encounter.    All questions were answered. The patient knows to call the clinic with any problems, questions or concerns. I spent {CHL ONC TIME VISIT - [id number] counseling the patient face to face. The total time spent in the appointment was {CHL ONC TIME VISIT - [id number] and more than 50% was on counseling.     Alla Feeling, NP 05/30/19 9:32 PM

## 2019-05-30 NOTE — Telephone Encounter (Signed)
Bradd Canary, NP, calling with concerns about patient's edema.  PCP is on medical leave, patient has virtual appointment with [facility] 3/24.  Arbie Cookey would like to discuss medication management, potential dose changes for torsemide. Please call at [phone]. Landis Gandy, RN

## 2019-05-31 ENCOUNTER — Inpatient Hospital Stay: Payer: Medicare Other | Admitting: Nurse Practitioner

## 2019-05-31 ENCOUNTER — Other Ambulatory Visit: Payer: Self-pay | Admitting: *Deleted

## 2019-05-31 ENCOUNTER — Other Ambulatory Visit: Payer: Self-pay

## 2019-05-31 ENCOUNTER — Telehealth: Payer: Self-pay | Admitting: Pharmacist

## 2019-05-31 ENCOUNTER — Inpatient Hospital Stay: Payer: Medicare Other

## 2019-05-31 ENCOUNTER — Ambulatory Visit (INDEPENDENT_AMBULATORY_CARE_PROVIDER_SITE_OTHER): Payer: Medicare Other | Admitting: Pharmacist

## 2019-05-31 DIAGNOSIS — J984 Other disorders of lung: Secondary | ICD-10-CM

## 2019-05-31 DIAGNOSIS — A310 Pulmonary mycobacterial infection: Secondary | ICD-10-CM

## 2019-05-31 NOTE — Patient Outreach (Signed)
Care plan created:  [facility] CM Care Plan Problem One     Most Recent Value  Care Plan Problem One  Complicated co-morbidities (symptomatic anemia possible related to medication for lung infection on COVID infection, respiratory failure, COPD  Role Documenting the Problem One  Care Management Coordinator  Care Plan for Problem One  Active  [facility] Long Term Goal   Pt will follow medical regimen including attending appts over the next 60 days by pt report and chart review.Margie Billet Long Term Goal Start Date  05/31/19  Interventions for Problem One Long Term Goal  No PCP appt. MD is on medical leave. Called office and scheuduled post acute appt with an associate and advised pt of new appt. Reviewed other scheudled appts. Pt has transportaion.  [facility] CM Short Term Goal #1   Pt to call NP for any problems to avoid complications over the next 30 days.  [facility] CM Short Term Goal #1 Start Date  05/31/19  Interventions for Short Term Goal #1  Educated on NP role and ability to collaborate with other providers and family for optimal outcomes.    [facility] CM Care Plan Problem Two     Most Recent Value  Care Plan Problem Two  Medication adherance.  Role Documenting the Problem Two  Care Management Coordinator  Care Plan for Problem Two  Active  [facility] CM Short Term Goal #1   Pt will obtain meds she does not have this week. (Symbicort, additional torsemide, ?dexamethazone.)  [facility] CM Short Term Goal #1 Start Date  05/31/19  Interventions for Short Term Goal #2   Medication reconciliation completed, found that pt does not have a Symbicort inhaler that she can physically use due to her RA, Dulera ordered and has this from the hospital but it is very expensive and ? if pt can afford this, dexamethazone in on her med list but she does not have this. Called pharmacy and they report that this was only a short course, 6 day Rx. Pt taking small dose torsemide but has gross edema to mid calf. Advised to take an extra tablet today and  tomorrow. Advised PA of intervention by phone call to his office. Request collaboration on this as pt also has CKD.     [name]. [name], MSN, [facility] Gerontological Nurse Practitioner [facility] Care Management [phone]

## 2019-05-31 NOTE — Telephone Encounter (Signed)
Thanks guys. She sent me a message, and I was unsure of how to respond. Thanks for taking care of it!

## 2019-05-31 NOTE — Telephone Encounter (Signed)
Thanks everyone

## 2019-05-31 NOTE — Progress Notes (Signed)
Virtual Visit via Telephone Note  I connected with Olivia Hooper on 05/31/2019 at 11:30 AM EDT by telephone and verified that I am speaking with the correct person using two identifiers.  Location: Patient: home Provider: office   I discussed the limitations, risks, security and privacy concerns of performing an evaluation and management service by telephone and the availability of in person appointments. I also discussed with the patient that there may be a patient responsible charge related to this service. The patient expressed understanding and agreed to proceed.  HPI: Olivia Hooper is a 79 y.o. female. She is currently following up via a telephone visit for her pulmonary mycobacterial infection.  Patient Active Problem List   Diagnosis Date Noted  . Pressure injury of skin 05/19/2019  . Acute respiratory disease due to COVID-19 virus 05/18/2019  . Symptomatic anemia 05/17/2019  . AKI (acute kidney injury) ([place]) 05/17/2019  . Cardiomegaly 05/17/2019  . Anemia 05/03/2019  . Drug-induced anemia 05/03/2019  . Sleeping excessive 05/03/2019  . 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 Cavitating mass in right upper lung lobe 01/31/2019  . S/P eye surgery 01/25/2015  . 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 Age-related macular degeneration, dry, both eyes 11/30/2014  . Pseudophakia of both eyes 11/30/2014  . Pulmonary nodules 03/18/2014  . Cigarette smoker 03/18/2014  . COPD GOLD II / still smoking  03/15/2014  . Profound fatigue 02/18/2011  . Encounter for long-term (current) use of other medications 01/08/2011  . Rheumatoid arthritis ([place]) 08/21/2009    Patient's Medications  New Prescriptions   No medications on file  Previous Medications   ACETAMINOPHEN (TYLENOL) 500 MG TABLET    Take 500 mg by mouth every 8 (eight) hours as needed for mild pain.    ALBUTEROL (VENTOLIN HFA) 108 (90  BASE) MCG/ACT INHALER    Inhale 2 puffs into the lungs every 6 (six) hours as needed for wheezing or shortness of breath.    AMBULATORY NON FORMULARY MEDICATION    Take 100 mg by mouth daily. Medication Name: clofazimine for mycobacterium abscessus pulmonary infection   ASCORBIC ACID (VITAMIN C) 500 MG TABLET    Take 1 tablet (500 mg total) by mouth 2 (two) times daily.   DEXAMETHASONE (DECADRON) 6 MG TABLET    Take 1 tablet (6 mg total) by mouth daily.   GUAIFENESIN-DEXTROMETHORPHAN (ROBITUSSIN DM) 100-10 MG/5ML SYRUP    Take 5 mLs by mouth every 4 (four) hours as needed for cough.   IPRATROPIUM-ALBUTEROL (COMBIVENT) 20-100 MCG/ACT AERS RESPIMAT    Inhale 2 puffs into the lungs every 6 (six) hours.   LIDOCAINE-PRILOCAINE (EMLA) CREAM    Apply 1 application topically as needed.   MOMETASONE-FORMOTEROL (DULERA) 100-5 MCG/ACT AERO    Inhale 2 puffs into the lungs 2 (two) times daily as needed for wheezing or shortness of breath.    MUPIROCIN OINTMENT (BACTROBAN) 2 %    Apply 1 application topically 2 (two) times daily.   POLYETHYL GLYCOL-PROPYL GLYCOL (SYSTANE OP)    Place 1 drop into both eyes daily as needed (dry eyes).   TORSEMIDE (DEMADEX) 5 MG TABLET    Take 5 mg by mouth daily.   TRAMADOL (ULTRAM) 50 MG TABLET    Take 50 mg by mouth 2 (two) times daily as needed for moderate pain.    ZINC SULFATE 220 (50 ZN) MG CAPSULE    Take 1 capsule (220 mg total) by  mouth daily.  Modified Medications   No medications on file  Discontinued Medications   No medications on file    Allergies: Allergies  Allergen Reactions  . Buprenorphine Hcl Nausea And Vomiting  . Morphine And Related Nausea And Vomiting         Past Medical History: Past Medical History:  Diagnosis Date  . Anemia 05/03/2019  . Breast cancer ([place])    on left  . Carpal tunnel syndrome   . COPD (chronic obstructive pulmonary disease) ([place])   . Drug-induced anemia 05/03/2019  . Dyspnea   . Hypercalcemia   . Hyperlipidemia   .  Hyperparathyroidism ([place])   . Osteoporosis   . Pneumonia   . 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 RA (rheumatoid arthritis) ([place])   . Sleeping excessive 05/03/2019  . Vertigo   . Vitamin D deficiency     Social History: Social History   Socioeconomic History  . Marital status: Widowed    Spouse name: Not on file  . Number of children: Not on file  . Years of education: Not on file  . Highest education level: Not on file  Occupational History  . Occupation: Retired   Tobacco Use  . Smoking status: Former Smoker    Packs/day: 0.25    Years: 62.00    Pack years: 15.50    Types: Cigarettes    Quit date: 01/12/2019    Years since quitting: 0.3  . Smokeless tobacco: Never Used  Substance and Sexual Activity  . Alcohol use: No    Alcohol/week: 0.0 standard drinks  . Drug use: No  . Sexual activity: Not on file  Other Topics Concern  . Not on file  Social History Narrative  . Not on file   Social Determinants of Health   Financial Resource Strain:   . Difficulty of Paying Living Expenses:   Food Insecurity:   . Worried About Charity fundraiser in the Last Year:   . Arboriculturist in the Last Year:   Transportation Needs:   . Film/video editor (Medical):   Marland Kitchen Lack of Transportation (Non-Medical):   Physical Activity:   . Days of Exercise per Week:   . Minutes of Exercise per Session:   Stress:   . Feeling of Stress :   Social Connections:   . Frequency of Communication with Friends and Family:   . Frequency of Social Gatherings with Friends and Family:   . Attends Religious Services:   . Active Member of Clubs or Organizations:   . Attends Archivist Meetings:   Marland Kitchen Marital Status:      Assessment: I spoke with Olivia Hooper, her 2 brothers, and care taker on the phone today to follow up regarding restarting medications for her pulmonary mycobacterial infection.  She is currently not taking anything right now after leaving the hospital and  being diagnosed with COVID.  She is currently quarantining and feels a lot better today. Her breathing is getting better.  I reviewed the options that she has for her treatment. They include: 1. Restarting IV cefoxitin, linezolid once daily, and oral clofazamine 2. Restarting IV cefoxitin, initiating tedizolid, and restarting oral clofazimine 3. Restarting IV cefoxitin, initiating IV tigecycline, and restarting oral clofazimine 4. Restarting IV cefoxitin, initiating IV amikacin, and restating oral clofazimine 5. Restarting IV cefoxitin, initiating IV meropenem, and restarting oral clofazimine  I discussed all of these options with the patient and her family including the cost of tedizolid, the  side effects of nausea and vomiting with tigecycline, the risk of AKI and ototoxicity with amikacin, and the fact that meropenem is only intermediate and not really the best option.    I honestly do not think her anemia was due to linezolid. It is quite rare that it would go that low without some sort of profound thrombocytopenia. She is getting her anemia worked up by the oncology staff as well. Her brothers noted that cost was no issue, but the omadacycline was well over $1500 per month.    After careful discussion, they elected to rechallenge the once daily linezolid option.  I instructed them that I would keep a close eye on her labs and actually increase her labs to twice weekly instead of once weekly to make sure we are watching he RBC, hemoglobin, and platelet count carefully. She still has her pump and states that she has some cefoxitin left in her fridge. I told her to double check with [facility] on the dates and if it is still ok to administer.  She has plenty of linezolid and clofazimine pills left.  I instructed her to take the linezolid only once daily.   I called [facility] and gave verbal orders to restart the cefoxitin. She was at 12 gm per daily, but I decreased her to 8 gms per day based on her CrCl and  renal function. OF NOTE - the cefoxitin can falsely increase the SCr on lab assay. It should be held x 2 hours before getting lab draws. I asked [facility] to go out on Friday and get the first set of labs and then do them twice weekly starting next week.  She sees Dr. Tommy Medal on 4/5 and states that she will be out of quarantine at that time and will be here in person.  They all know to call me with any issues or concerns that arise. They all have my direct phone number.  Plan: - Restart cefoxitin IV continuous infusion of 8 gm/day - Restart clofazimine 100 mg PO daily - Restart linezolid 600 mg PO daily - Labs twice weekly - F/u with Dr. Tommy Medal 4/5 at 1030am   I discussed the assessment and treatment plan with the patient. The patient was provided an opportunity to ask questions and all were answered. The patient agreed with the plan and demonstrated an understanding of the instructions.   The patient was advised to call back or seek an in-person evaluation if the symptoms worsen or if the condition fails to improve as anticipated.  I provided 35 minutes of non-face-to-face time during this encounter.  Ketih Goodie L. Simisola Sandles, PharmD, BCIDP, AAHIVP, CPP Clinical Pharmacist Practitioner Infectious Diseases [place] 06/01/2019, 2:34 PM

## 2019-05-31 NOTE — Telephone Encounter (Signed)
Relayed to [place]. Thanks.

## 2019-05-31 NOTE — Telephone Encounter (Signed)
I don't think that part diuretic should Be discussed w [place]. 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

## 2019-05-31 NOTE — Telephone Encounter (Signed)
Spoke with Coretta at [facility] and gave verbal order to restart patient's IV continuous cefoxitin infusion for her M abscess infection.  She was previously on 12 gm per day over 24 hours but I changed her to 8 gm per day due to her renal function (CrCl ~32 ml/min). Asked Coretta to draw labs this Friday (CBC w diff + CMP) and biweekly thereafter. Gave strict instructions to watch hemoglobin and platelets and to call me immediately if there are concerns. Patient and patient's family know to call me with issues as well (see office note from today).  OF NOTE - cefoxitin will falsely elevate the SCr on labs and should be held x 2 hours before CMP is drawn if an accurate SCr is needed. Relayed this information.

## 2019-05-31 NOTE — Patient Outreach (Signed)
Update:  NP did not call pt 05/30/19 giving instructions to take an extra torsemide 5 mg last night. NP called this am and instructed pt to take torsemide 10 mg this am and tomorrow am. She acknowledged that she would do this.   Called primary care office of Dr. Maudie Mercury and requested post hospitalization visit. Appt scheduled for 06/12/19 at 8:30 am. She will see Mr Clarene Duke, [place]. Left message with the practice of NP intervention and request to collaborate with Mr. Lorre Nick on pt care from now until he sees her on 06/12/19.  Called pt back and gave her the appt information.  I will follow up again on Friday.  [name]. [name], MSN, [facility] Gerontological Nurse Practitioner [facility] Care Management [phone]

## 2019-06-01 MED ORDER — LINEZOLID 600 MG PO TABS: 600.0000 mg | ORAL_TABLET | Freq: Every day | ORAL | 11 refills | Status: DC

## 2019-06-01 MED ORDER — DEXTROSE 5 % IV SOLN: 8.0000 g | INTRAVENOUS | 11 refills | Status: DC

## 2019-06-01 NOTE — Telephone Encounter (Signed)
Called and spoke to [place] today to make sure everything was set up.  I also asked her to hold her cefoxitin infusion x 2 hours before they drew her labs each week so we can get an accurate SCr reading (cefoxitin can falsely increase).  She seemed to understand but I worry that she may forget since I did not talk to her brothers or care taker this time.  I will try and remind her again.

## 2019-06-01 NOTE — Telephone Encounter (Signed)
Thanks so much [name]! 

## 2019-06-02 ENCOUNTER — Encounter: Payer: Self-pay | Admitting: Infectious Disease

## 2019-06-02 ENCOUNTER — Encounter: Payer: Self-pay | Admitting: *Deleted

## 2019-06-02 ENCOUNTER — Other Ambulatory Visit: Payer: Self-pay | Admitting: *Deleted

## 2019-06-02 ENCOUNTER — Telehealth: Payer: Self-pay

## 2019-06-02 DIAGNOSIS — Z9981 Dependence on supplemental oxygen: Secondary | ICD-10-CM | POA: Diagnosis not present

## 2019-06-02 DIAGNOSIS — M810 Age-related osteoporosis without current pathological fracture: Secondary | ICD-10-CM | POA: Diagnosis not present

## 2019-06-02 DIAGNOSIS — E213 Hyperparathyroidism, unspecified: Secondary | ICD-10-CM | POA: Diagnosis not present

## 2019-06-02 DIAGNOSIS — I517 Cardiomegaly: Secondary | ICD-10-CM | POA: Diagnosis not present

## 2019-06-02 DIAGNOSIS — R918 Other nonspecific abnormal finding of lung field: Secondary | ICD-10-CM | POA: Diagnosis not present

## 2019-06-02 DIAGNOSIS — I051 Rheumatic mitral insufficiency: Secondary | ICD-10-CM | POA: Diagnosis not present

## 2019-06-02 DIAGNOSIS — M069 Rheumatoid arthritis, unspecified: Secondary | ICD-10-CM | POA: Diagnosis not present

## 2019-06-02 DIAGNOSIS — A310 Pulmonary mycobacterial infection: Secondary | ICD-10-CM | POA: Diagnosis not present

## 2019-06-02 DIAGNOSIS — I5189 Other ill-defined heart diseases: Secondary | ICD-10-CM | POA: Diagnosis not present

## 2019-06-02 DIAGNOSIS — D599 Acquired hemolytic anemia, unspecified: Secondary | ICD-10-CM | POA: Diagnosis not present

## 2019-06-02 DIAGNOSIS — E559 Vitamin D deficiency, unspecified: Secondary | ICD-10-CM | POA: Diagnosis not present

## 2019-06-02 DIAGNOSIS — Z792 Long term (current) use of antibiotics: Secondary | ICD-10-CM | POA: Diagnosis not present

## 2019-06-02 DIAGNOSIS — E785 Hyperlipidemia, unspecified: Secondary | ICD-10-CM | POA: Diagnosis not present

## 2019-06-02 DIAGNOSIS — N179 Acute kidney failure, unspecified: Secondary | ICD-10-CM | POA: Diagnosis not present

## 2019-06-02 DIAGNOSIS — E43 Unspecified severe protein-calorie malnutrition: Secondary | ICD-10-CM | POA: Diagnosis not present

## 2019-06-02 DIAGNOSIS — Z452 Encounter for adjustment and management of vascular access device: Secondary | ICD-10-CM | POA: Diagnosis not present

## 2019-06-02 DIAGNOSIS — J9601 Acute respiratory failure with hypoxia: Secondary | ICD-10-CM | POA: Diagnosis not present

## 2019-06-02 DIAGNOSIS — U071 COVID-19: Secondary | ICD-10-CM | POA: Diagnosis not present

## 2019-06-02 DIAGNOSIS — Z87891 Personal history of nicotine dependence: Secondary | ICD-10-CM | POA: Diagnosis not present

## 2019-06-02 DIAGNOSIS — Z853 Personal history of malignant neoplasm of breast: Secondary | ICD-10-CM | POA: Diagnosis not present

## 2019-06-02 DIAGNOSIS — L89312 Pressure ulcer of right buttock, stage 2: Secondary | ICD-10-CM | POA: Diagnosis not present

## 2019-06-02 DIAGNOSIS — H353130 Nonexudative age-related macular degeneration, bilateral, stage unspecified: Secondary | ICD-10-CM | POA: Diagnosis not present

## 2019-06-02 DIAGNOSIS — D649 Anemia, unspecified: Secondary | ICD-10-CM | POA: Diagnosis not present

## 2019-06-02 DIAGNOSIS — J440 Chronic obstructive pulmonary disease with acute lower respiratory infection: Secondary | ICD-10-CM | POA: Diagnosis not present

## 2019-06-02 DIAGNOSIS — I700 Atherosclerosis of aorta: Secondary | ICD-10-CM | POA: Diagnosis not present

## 2019-06-02 DIAGNOSIS — Z5181 Encounter for therapeutic drug level monitoring: Secondary | ICD-10-CM | POA: Diagnosis not present

## 2019-06-02 NOTE — Patient Outreach (Signed)
Telephone outreach to follow up on ID appt, medication effectiveness for edema, complete initial assessment.  Pt did not answer either of her numbers today. I was able to leave a voice mail on her home phone and requested a return call.  [name]. [name], MSN, GNP-BC Gerontological Nurse Practitioner [facility] Care Management [phone]  Pt returned my call. We completed her initial assessment.  She reports her edema was decreased by 2 days of double her torsemide dose but she still has significant edema. She will see Marc Morgans at her primary care office on Monday to follow up on this and update regarding her recent hospitalization.  She has been counseled by ID, Dr. Mariam Dollar. And will resume her chemotherapies for the mycobacterium infection. They will be monitoring her blood work two times a week to watch for recurrent anemia.  Completed a MOST form today. Pt wants to be a full code and has named her 2 elder children and her 55. This is not in her chart and I have requested she take a copy to her primary care so that it can be copied in to her chart. Would also like this to be in Epic.  Fall Risk  06/02/2019 03/14/2019 11/05/2015  Falls in the past year? 0 0 No  Comment - - Emmi Telephone Survey: data to providers prior to load  Number falls in past yr: 0 - -  Injury with Fall? 0 - -  Risk for fall due to : Medication side effect;Other (Comment) Orthopedic patient;Impaired mobility -  Risk for fall due to: Comment General frailty has RA, not on medicaiton at this time -  Follow up Falls evaluation completed Falls evaluation completed -   Depression screen [facility] 2/9 06/02/2019 03/14/2019  Decreased Interest 0 0  Down, Depressed, Hopeless 0 0  PHQ - 2 Score 0 0   THN CM Care Plan Problem One     Most Recent Value  Care Plan Problem One  Complicated co-morbidities (symptomatic anemia possible related to medication for lung infection on COVID infection, respiratory failure, COPD  Role Documenting  the Problem One  Care Management Coordinator  Care Plan for Problem One  Active  THN Long Term Goal   Pt will follow medical regimen including attending appts over the next 60 days by pt report and chart review.Margie Billet Long Term Goal Start Date  05/31/19  Interventions for Problem One Long Term Goal  Reminded of upcoming primary care appt on Monday.  THN CM Short Term Goal #1   Pt to call NP for any problems to avoid complications over the next 30 days.  THN CM Short Term Goal #1 Start Date  05/31/19  Interventions for Short Term Goal #1  Advised pt to discuss a protocol for wt gain and edema balancing reducing edema and reducing renal risk    THN CM Care Plan Problem Two     Most Recent Value  Care Plan Problem Two  Medication adherance.  Role Documenting the Problem Two  Care Management Coordinator  Care Plan for Problem Two  Active  THN CM Short Term Goal #1   Pt will obtain meds she does not have this week. (Symbicort, additional torsemide, ?dexamethazone.)  THN CM Short Term Goal #1 Start Date  05/31/19  Interventions for Short Term Goal #2   Sent note to primary care office regarding reconciliation and need for evaluation of edema, consider edema protocol.     I will call her again in one week.  [name]. [name], MSN, [facility] Gerontological Nurse Practitioner [facility] Care Management [phone]

## 2019-06-02 NOTE — Telephone Encounter (Signed)
We were not rx diflucan (fluconazole)

## 2019-06-02 NOTE — Telephone Encounter (Signed)
I hope so too. These are not meds that cant be taken incorrectly without risks

## 2019-06-02 NOTE — Telephone Encounter (Addendum)
Received voicemail from [name] requesting clarification on patient's medication. Patient was not sure if she should restart zyvox and Diflucan. Per [name], Pharm D last note patient was supposed to restart Zyvox. Did not mention anything about Fluconazole. Will forward message to MD and pharmacy to advise on fluconazole. Case manager will update patient and have her restart zyvox. P: Olivia Hooper, Olivia Hooper

## 2019-06-02 NOTE — Telephone Encounter (Signed)
I'm a little worried.. I talked to her 3 times since Wednesday and she was confused each time.Marland Kitchen Hopefully her family is helping her out.

## 2019-06-05 ENCOUNTER — Telehealth: Payer: Self-pay

## 2019-06-05 ENCOUNTER — Other Ambulatory Visit: Payer: Self-pay

## 2019-06-05 DIAGNOSIS — J440 Chronic obstructive pulmonary disease with acute lower respiratory infection: Secondary | ICD-10-CM | POA: Diagnosis not present

## 2019-06-05 DIAGNOSIS — L89312 Pressure ulcer of right buttock, stage 2: Secondary | ICD-10-CM | POA: Diagnosis not present

## 2019-06-05 DIAGNOSIS — B370 Candidal stomatitis: Secondary | ICD-10-CM

## 2019-06-05 DIAGNOSIS — D649 Anemia, unspecified: Secondary | ICD-10-CM | POA: Diagnosis not present

## 2019-06-05 DIAGNOSIS — J9601 Acute respiratory failure with hypoxia: Secondary | ICD-10-CM | POA: Diagnosis not present

## 2019-06-05 DIAGNOSIS — I10 Essential (primary) hypertension: Secondary | ICD-10-CM | POA: Diagnosis not present

## 2019-06-05 DIAGNOSIS — A310 Pulmonary mycobacterial infection: Secondary | ICD-10-CM | POA: Diagnosis not present

## 2019-06-05 DIAGNOSIS — U071 COVID-19: Secondary | ICD-10-CM | POA: Diagnosis not present

## 2019-06-05 MED ORDER — FLUCONAZOLE 100 MG PO TABS: 200.0000 mg | ORAL_TABLET | Freq: Every day | ORAL | 0 refills | Status: DC

## 2019-06-05 NOTE — Telephone Encounter (Signed)
Received call from patients daughter that she has developed thrush again. Patient is unable to eat due to severity of symptoms. Patient was taking fluconazole prior to hospital discharge but is out. Restarted antibiotics recently and the thrush has returned. Daughter requesting additional refills from provider.   [place] Nick Lorita Officer, RN

## 2019-06-05 NOTE — Telephone Encounter (Signed)
Flucoanzole 200mg  daily x 10 days is OK. This is a tough case.

## 2019-06-06 ENCOUNTER — Telehealth: Payer: Self-pay | Admitting: Pharmacist

## 2019-06-06 NOTE — Telephone Encounter (Signed)
Jeani Hawking, Pharmacist with [facility] call with critical labs. States patients glucose was 45 at last blood draw. Has been unable to reach patient or daughter to recheck at home. [place]

## 2019-06-06 NOTE — Telephone Encounter (Signed)
RN spoke with patient. She had not eaten before her lab draw. RN encouraged her to eat small meals throughout the day. She notes that her family will be traveling mid April for a family wedding. She will not have anyone to help change the IV bag over a weekend. She will speak with her home health nurse for suggestions, will follow up with [place]. Landis Gandy, RN

## 2019-06-06 NOTE — Telephone Encounter (Signed)
Reviewed patient's labs. Results are below:  3/26: hemoglobin 9.9; platelet 244; SCr 1.11 3/29: hemoglobin 9.5; platelet 213; SCr 0.99  Will continue to monitor closely while on linezolid, cefoxitin, and clofazimine for her pulmonary mycobacterial infection.

## 2019-06-07 NOTE — Telephone Encounter (Signed)
Is she diabetic as well?

## 2019-06-08 ENCOUNTER — Ambulatory Visit
Admission: RE | Admit: 2019-06-08 | Discharge: 2019-06-08 | Disposition: A | Discharge status: Home / Self Care | Payer: Medicare Other | Source: Ambulatory Visit | Attending: Thoracic Surgery (Cardiothoracic Vascular Surgery) | Admitting: Thoracic Surgery (Cardiothoracic Vascular Surgery)

## 2019-06-08 ENCOUNTER — Encounter: Payer: Self-pay | Admitting: Infectious Disease

## 2019-06-08 DIAGNOSIS — L89312 Pressure ulcer of right buttock, stage 2: Secondary | ICD-10-CM | POA: Diagnosis not present

## 2019-06-08 DIAGNOSIS — I10 Essential (primary) hypertension: Secondary | ICD-10-CM | POA: Diagnosis not present

## 2019-06-08 DIAGNOSIS — J440 Chronic obstructive pulmonary disease with acute lower respiratory infection: Secondary | ICD-10-CM | POA: Diagnosis not present

## 2019-06-08 DIAGNOSIS — J9859 Other diseases of mediastinum, not elsewhere classified: Secondary | ICD-10-CM | POA: Diagnosis not present

## 2019-06-08 DIAGNOSIS — D649 Anemia, unspecified: Secondary | ICD-10-CM | POA: Diagnosis not present

## 2019-06-08 DIAGNOSIS — J9601 Acute respiratory failure with hypoxia: Secondary | ICD-10-CM | POA: Diagnosis not present

## 2019-06-08 DIAGNOSIS — U071 COVID-19: Secondary | ICD-10-CM | POA: Diagnosis not present

## 2019-06-08 DIAGNOSIS — A310 Pulmonary mycobacterial infection: Secondary | ICD-10-CM | POA: Diagnosis not present

## 2019-06-09 ENCOUNTER — Other Ambulatory Visit: Payer: Self-pay | Admitting: *Deleted

## 2019-06-09 NOTE — Patient Outreach (Signed)
Telephone outreach unsuccessful. Left a message requesting a return call.  [name]. [name], MSN, [facility] Gerontological Nurse Practitioner [facility] Care Management [phone]

## 2019-06-10 DIAGNOSIS — J440 Chronic obstructive pulmonary disease with acute lower respiratory infection: Secondary | ICD-10-CM | POA: Diagnosis not present

## 2019-06-10 DIAGNOSIS — U071 COVID-19: Secondary | ICD-10-CM | POA: Diagnosis not present

## 2019-06-10 DIAGNOSIS — J9601 Acute respiratory failure with hypoxia: Secondary | ICD-10-CM | POA: Diagnosis not present

## 2019-06-10 DIAGNOSIS — L89312 Pressure ulcer of right buttock, stage 2: Secondary | ICD-10-CM | POA: Diagnosis not present

## 2019-06-10 DIAGNOSIS — D649 Anemia, unspecified: Secondary | ICD-10-CM | POA: Diagnosis not present

## 2019-06-10 DIAGNOSIS — A310 Pulmonary mycobacterial infection: Secondary | ICD-10-CM | POA: Diagnosis not present

## 2019-06-12 ENCOUNTER — Encounter: Payer: Self-pay | Admitting: Infectious Disease

## 2019-06-12 ENCOUNTER — Ambulatory Visit (INDEPENDENT_AMBULATORY_CARE_PROVIDER_SITE_OTHER): Payer: Medicare Other | Admitting: Infectious Disease

## 2019-06-12 ENCOUNTER — Other Ambulatory Visit: Payer: Self-pay

## 2019-06-12 VITALS — BP 145/69 | HR 84 | Wt 91.8 lb

## 2019-06-12 DIAGNOSIS — L89312 Pressure ulcer of right buttock, stage 2: Secondary | ICD-10-CM | POA: Diagnosis not present

## 2019-06-12 DIAGNOSIS — M069 Rheumatoid arthritis, unspecified: Secondary | ICD-10-CM | POA: Diagnosis not present

## 2019-06-12 DIAGNOSIS — J984 Other disorders of lung: Secondary | ICD-10-CM

## 2019-06-12 DIAGNOSIS — R609 Edema, unspecified: Secondary | ICD-10-CM | POA: Diagnosis not present

## 2019-06-12 DIAGNOSIS — I10 Essential (primary) hypertension: Secondary | ICD-10-CM | POA: Diagnosis not present

## 2019-06-12 DIAGNOSIS — A310 Pulmonary mycobacterial infection: Secondary | ICD-10-CM | POA: Diagnosis not present

## 2019-06-12 DIAGNOSIS — U071 COVID-19: Secondary | ICD-10-CM

## 2019-06-12 DIAGNOSIS — D649 Anemia, unspecified: Secondary | ICD-10-CM | POA: Diagnosis not present

## 2019-06-12 DIAGNOSIS — J440 Chronic obstructive pulmonary disease with acute lower respiratory infection: Secondary | ICD-10-CM | POA: Diagnosis not present

## 2019-06-12 DIAGNOSIS — E611 Iron deficiency: Secondary | ICD-10-CM | POA: Diagnosis not present

## 2019-06-12 DIAGNOSIS — A312 Disseminated mycobacterium avium-intracellulare complex (DMAC): Secondary | ICD-10-CM | POA: Diagnosis not present

## 2019-06-12 DIAGNOSIS — R0600 Dyspnea, unspecified: Secondary | ICD-10-CM | POA: Diagnosis not present

## 2019-06-12 DIAGNOSIS — J9601 Acute respiratory failure with hypoxia: Secondary | ICD-10-CM | POA: Diagnosis not present

## 2019-06-12 DIAGNOSIS — J449 Chronic obstructive pulmonary disease, unspecified: Secondary | ICD-10-CM | POA: Diagnosis not present

## 2019-06-12 NOTE — Progress Notes (Signed)
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   MRN: [id number]  HPI  -year-old Caucasian lady with history of tobacco use chronic obstructive pulmonary disease severe rheumatoid arthritis, left breast cancer who has been on immunosuppressive therapy. She apparently had a 1 month history of hemoptysis. X-ray had shown a right upper lobe lung mass CT had shown 6.5 x 5.2 x 7.47 manner cavitary mass in the right upper lobe. There are multiple small lung nodules as well. There is also a small anterior mediastinal mass.  Patient underwent bronchoscopy with Dr. Tamala Julian with cytology is AFB fungal stains and cultures. 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 In the interval she had a PET CT scan that showed the cavitary mass which was very hypermetabolic patchy tree-in-bud opacities in both lungs there were hypermetabolic and a 3.9 1.6 cm anterior mediastinal soft tissue mass that was hypermetabolic.  There was anxiety about patient potentially having a thymoma but at present she was felt to be a high risk surgical patient when seen by Dr. Salomon Fick with cardiothoracic surgery.  I also agree with him that this mediastinal mass could be due to the mycobacterial abscessus infection.  These organisms are not notoriously difficult to treat still to have some anxiety the patient might need some surgical intervention to gain control of her chest pathology.  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  She hadlost 10 pounds of weight over the last half year.  She has stopped smoking now.  We crafted a regimen to treat her Mycobacterium abscessus with once daily clofazimine twice daily Zyvox and IV cefoxitin via continuous infusion. When I saw her in late  February she was 5 weeks into treatment. She has been feeling progressively more fatigued and sleeping excessively according to both her and her son who accompanied her.  I noticed that she had dropped her hemoglobin down into the range of 7.4 after being 10.  I was concerned that Zyvox might have been causing some myelosuppression though her platelets had not dropped and we dropped her Zyvox dose to once daily.  In the interim she experienced worsening fatigue and anemia and was admitted hospital.  Her hemoglobin had dropped below 7 into the 6 range.  She was found to have COVID-19 and was treated with steroids and remdesivir her antibiotics were stopped altogether.  She was seen by my partner Dr. Linus Salmons and and he an alternate regimen was considered including omadacycline.  Unfortunately even omadacycline was approved her cost for drugs was still 7000 due to the nature of her insurance.  Crafting of a new regimen was deferred until she was in the outpatient world.  We ended up deciding after careful discussion with the patient and with [name] go back to a regimen of IV cefoxitin with a lower dose Zyvox and continued clofazimine.  We have been watching her labs twice weekly and her CBC has shown a stable hemoglobin and platelets have also not dropped.  She has her port in place and she is tolerating the cefoxitin which is running as we and examined her.  She is not a big fan of being on IV antibiotics all the time but it seems with coaching from her family she has been able to continue to do this.  Her son accompanied today and both  of them wish to continue forward with the current regimen.    Past Medical History:  Diagnosis Date  . Anemia 05/03/2019  . Breast cancer ([place])    on left  . Carpal tunnel syndrome   . COPD (chronic obstructive pulmonary disease) ([place])   . Drug-induced anemia 05/03/2019  . Dyspnea   . Hypercalcemia   . Hyperlipidemia   . Hyperparathyroidism ([place])   .  Osteoporosis   . Pneumonia   . Pulmonary mycobacterial infection ([place]) 03/14/2019  . RA (rheumatoid arthritis) ([place])   . Sleeping excessive 05/03/2019  . Vertigo   . Vitamin D deficiency     Past Surgical History:  Procedure Laterality Date  . CARPAL TUNNEL RELEASE    . CATARACT EXTRACTION    . IR IMAGING GUIDED PORT INSERTION  04/05/2019  . MASTECTOMY Left   . VESICOVAGINAL FISTULA CLOSURE W/ TAH    . VIDEO BRONCHOSCOPY WITH ENDOBRONCHIAL ULTRASOUND N/A 01/18/2019   Procedure: VIDEO BRONCHOSCOPY WITH ENDOBRONCHIAL ULTRASOUND;  Surgeon: Candee Furbish, MD;  Location: [facility] OR;  Service: Thoracic;  Laterality: N/A;    Family History  Problem Relation Age of Onset  . Heart disease Father   . Bone cancer Father   . Emphysema Father        never smoker  . Heart disease Mother   . Colon cancer Mother   . Melanoma Sister   . Kidney cancer Brother       Social History   Socioeconomic History  . Marital status: Widowed    Spouse name: Not on file  . Number of children: 4  . Years of education: 12+  . Highest education level: Associate degree: occupational, Hotel manager, or vocational program  Occupational History  . Occupation: Retired   Tobacco Use  . Smoking status: Former Smoker    Packs/day: 0.25    Years: 62.00    Pack years: 15.50    Types: Cigarettes    Quit date: 01/12/2019    Years since quitting: 0.4  . Smokeless tobacco: Never Used  Substance and Sexual Activity  . Alcohol use: No    Alcohol/week: 0.0 standard drinks  . Drug use: No  . Sexual activity: Not on file  Other Topics Concern  . Not on file  Social History Narrative   Lives alone, 4 children, independent.   Social Determinants of Health   Financial Resource Strain: Low Risk   . Difficulty of Paying Living Expenses: Not hard at all  Food Insecurity: No Food Insecurity  . Worried About Charity fundraiser in the Last Year: Never true  . Ran Out of Food in the Last Year: Never true  Transportation  Needs: No Transportation Needs  . Lack of Transportation (Medical): No  . Lack of Transportation (Non-Medical): No  Physical Activity: Inactive  . Days of Exercise per Week: 0 days  . Minutes of Exercise per Session: 0 min  Stress: No Stress Concern Present  . Feeling of Stress : Only a little  Social Connections: Slightly Isolated  . Frequency of Communication with Friends and Family: More than three times a week  . Frequency of Social Gatherings with Friends and Family: More than three times a week  . Attends Religious Services: More than 4 times per year  . Active Member of Clubs or Organizations: Yes  . Attends Archivist Meetings: More than 4 times per year  . Marital Status: Widowed    Allergies  Allergen Reactions  . Buprenorphine Hcl  Nausea And Vomiting  . Morphine And Related Nausea And Vomiting          Current Outpatient Medications:  .  acetaminophen (TYLENOL) 500 MG tablet, Take 500 mg by mouth every 8 (eight) hours as needed for mild pain. , Disp: , Rfl:  .  albuterol (VENTOLIN HFA) 108 (90 Base) MCG/ACT inhaler, Inhale 2 puffs into the lungs every 6 (six) hours as needed for wheezing or shortness of breath. , Disp: , Rfl:  .  AMBULATORY NON FORMULARY MEDICATION, Take 100 mg by mouth daily. Medication Name: clofazimine for mycobacterium abscessus pulmonary infection (Patient not taking: Reported on 05/30/2019), Disp: 100 capsule, Rfl: 2 .  ascorbic acid (VITAMIN C) 500 MG tablet, Take 1 tablet (500 mg total) by mouth 2 (two) times daily., Disp: 30 tablet, Rfl: 0 .  cefOXitin 8 g in dextrose 5 % 50 mL, Inject 8 g into the vein continuous., Disp: 8 g, Rfl: 11 .  fluconazole (DIFLUCAN) 100 MG tablet, Take 2 tablets (200 mg total) by mouth daily., Disp: 20 tablet, Rfl: 0 .  guaiFENesin-dextromethorphan (ROBITUSSIN DM) 100-10 MG/5ML syrup, Take 5 mLs by mouth every 4 (four) hours as needed for cough., Disp: 118 mL, Rfl: 0 .  Ipratropium-Albuterol (COMBIVENT) 20-100  MCG/ACT AERS respimat, Inhale 2 puffs into the lungs every 6 (six) hours. (Patient not taking: Reported on 05/30/2019), Disp: 4 g, Rfl: 0 .  lidocaine-prilocaine (EMLA) cream, Apply 1 application topically as needed. (Patient taking differently: Apply 1 application topically as needed (access port). ), Disp: 30 g, Rfl: 0 .  linezolid (ZYVOX) 600 MG tablet, Take 1 tablet (600 mg total) by mouth daily., Disp: 30 tablet, Rfl: 11 .  mometasone-formoterol (DULERA) 100-5 MCG/ACT AERO, Inhale 2 puffs into the lungs 2 (two) times daily as needed for wheezing or shortness of breath. , Disp: , Rfl:  .  mupirocin ointment (BACTROBAN) 2 %, Apply 1 application topically 2 (two) times daily. (Patient not taking: Reported on 05/30/2019), Disp: 30 g, Rfl: 2 .  Polyethyl Glycol-Propyl Glycol (SYSTANE OP), Place 1 drop into both eyes daily as needed (dry eyes)., Disp: , Rfl:  .  torsemide (DEMADEX) 5 MG tablet, Take 5 mg by mouth daily., Disp: , Rfl:  .  traMADol (ULTRAM) 50 MG tablet, Take 50 mg by mouth 2 (two) times daily as needed for moderate pain. , Disp: , Rfl:  .  zinc sulfate 220 (50 Zn) MG capsule, Take 1 capsule (220 mg total) by mouth daily., Disp:  , Rfl:    Review of Systems  Constitutional: Negative for activity change, appetite change, chills, diaphoresis, fatigue, fever and unexpected weight change.  HENT: Negative for congestion, rhinorrhea, sinus pressure, sneezing, sore throat and trouble swallowing.   Eyes: Negative for photophobia and visual disturbance.  Respiratory: Negative for cough, chest tightness, shortness of breath, wheezing and stridor.   Cardiovascular: Negative for chest pain, palpitations and leg swelling.  Gastrointestinal: Negative for abdominal distention, abdominal pain, anal bleeding, blood in stool, constipation, diarrhea, nausea and vomiting.  Genitourinary: Negative for difficulty urinating, dysuria, flank pain and hematuria.  Musculoskeletal: Negative for arthralgias, back  pain, gait problem, joint swelling and myalgias.  Skin: Negative for color change, pallor, rash and wound.  Neurological: Negative for dizziness, tremors, weakness and light-headedness.  Hematological: Negative for adenopathy. Does not bruise/bleed easily.  Psychiatric/Behavioral: Negative for agitation, behavioral problems, confusion, decreased concentration, dysphoric mood and sleep disturbance.       Objective:   Physical Exam Constitutional:  General: She is not in acute distress.    Appearance: Normal appearance. She is well-developed. She is not ill-appearing or diaphoretic.  HENT:     Head: Normocephalic and atraumatic.     Right Ear: Hearing and external ear normal.     Left Ear: Hearing and external ear normal.     Nose: No nasal deformity or rhinorrhea.  Eyes:     General: No scleral icterus.    Conjunctiva/sclera: Conjunctivae normal.     Right eye: Right conjunctiva is not injected.     Left eye: Left conjunctiva is not injected.     Pupils: Pupils are equal, round, and reactive to light.  Neck:     Vascular: No JVD.  Cardiovascular:     Rate and Rhythm: Normal rate and regular rhythm.     Heart sounds: Normal heart sounds, S1 normal and S2 normal. No murmur. No friction rub. No gallop.   Pulmonary:     Effort: Pulmonary effort is normal. Prolonged expiration present. No accessory muscle usage.     Breath sounds: Examination of the right-upper field reveals decreased breath sounds. Decreased breath sounds present. No wheezing.  Abdominal:     General: Bowel sounds are normal. There is no distension.     Palpations: Abdomen is soft.     Tenderness: There is no abdominal tenderness.  Musculoskeletal:        General: Normal range of motion.     Right shoulder: Normal.     Left shoulder: Normal.     Cervical back: Normal range of motion and neck supple.     Right hip: Normal.     Left hip: Normal.     Right knee: Normal.     Left knee: Normal.    Lymphadenopathy:     Head:     Right side of head: No submandibular, preauricular or posterior auricular adenopathy.     Left side of head: No submandibular, preauricular or posterior auricular adenopathy.     Cervical: No cervical adenopathy.     Right cervical: No superficial or deep cervical adenopathy.    Left cervical: No superficial or deep cervical adenopathy.  Skin:    General: Skin is warm and dry.     Coloration: Skin is not pale.     Findings: No abrasion, bruising, ecchymosis, erythema, lesion or rash.     Nails: There is no clubbing.  Neurological:     Mental Status: She is alert and oriented to person, place, and time.     Sensory: No sensory deficit.     Coordination: Coordination normal.     Gait: Gait normal.  Psychiatric:        Attention and Perception: She is attentive.        Speech: Speech normal.        Behavior: Behavior normal. Behavior is cooperative.        Thought Content: Thought content normal.        Judgment: Judgment normal.           Assessment & Plan:   Mycobacterium abscessus pulmonary infection: Reiterated how difficult it is to treat these infections and that the treatment would be typically a minimum of 1 years duration.  I have personally reviewed her recent CT scan which shows stability of her upper lobe cavitary lesion along with the area that might be a thymoma with improvement in some of the other areas of cavitation in the lungs.  For now we will continue  with current therapy.  Anticipate eventually we will run into problems with bone marrow toxicity from the Zyvox and have to replace it with something possibly omadacycline.   If she tires of IV antibiotic therapy that would be another reason to need to replace a drug but hopefully she can continue on this for now.  We will plan on seeing her back in 2 months time  COVID-19: Recovered  Anemia: Not clear what the cause of this was but she has been doing fine on Wednesday Zyvox  with rechallenge

## 2019-06-13 ENCOUNTER — Encounter: Payer: Self-pay | Admitting: Thoracic Surgery (Cardiothoracic Vascular Surgery)

## 2019-06-13 ENCOUNTER — Ambulatory Visit (INDEPENDENT_AMBULATORY_CARE_PROVIDER_SITE_OTHER): Payer: Medicare Other | Admitting: Thoracic Surgery (Cardiothoracic Vascular Surgery)

## 2019-06-13 VITALS — BP 147/72 | HR 94 | Temp 99.1°F | Resp 20 | Ht 65.0 in | Wt 91.0 lb

## 2019-06-13 DIAGNOSIS — J9859 Other diseases of mediastinum, not elsewhere classified: Secondary | ICD-10-CM | POA: Diagnosis not present

## 2019-06-13 NOTE — Progress Notes (Signed)
[address]       [place] [address]             [phone]       HPI: Olivia Hooper returns for a scheduled follow-up visit  Olivia Hooper is a 79 year old woman with a history of tobacco abuse, COPD, rheumatoid arthritis, left breast cancer, hyperparathyroidism, hyperlipidemia, osteoporosis, and vertigo.  She presented last fall with hemoptysis and was found to have a cavitary right upper lobe mass and multiple smaller pulmonary nodules.  There also was an anterior mediastinal soft tissue mass.  Bronchoscopy revealed atypical mycobacterial infection with MAC.  She has been followed since then for the anterior mediastinal "mass" and cavitary lung nodules.  Last saw her in February.  At that time she had not had any new respiratory issues.  There was some increase in size of her cavitary lung nodules but she had only recently started her antibiotics.  The anterior mediastinal soft tissue was unchanged.  In the interim since her last visit, she was hospitalized in March.  She says that she was diagnosed with Covid and had 3 units of blood transfused.  Her anemia was thought to be secondary to Zyvox.  She saw Dr. Tommy Medal yesterday.  She is currently on cefoxitin, Diflucan, and Zyvox.  She continues to have issues with diarrhea related to that.  She does have a cough.  She is not had any hemoptysis.  She has not had any new respiratory issues.  Weight is stable around 89 to 90 pounds.  Past Medical History:  Diagnosis Date  . Anemia 05/03/2019  . Breast cancer ([place])    on left  . Carpal tunnel syndrome   . COPD (chronic obstructive pulmonary disease) ([place])   . Drug-induced anemia 05/03/2019  . Dyspnea   . Hypercalcemia   . Hyperlipidemia   . Hyperparathyroidism ([place])   . Osteoporosis   . Pneumonia   . 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 RA (rheumatoid arthritis) ([place])   . Sleeping excessive 05/03/2019  . Vertigo   . Vitamin D deficiency      Current Outpatient Medications  Medication Sig Dispense Refill  . acetaminophen (TYLENOL) 500 MG tablet Take 500 mg by mouth every 8 (eight) hours as needed for mild pain.     Marland Kitchen albuterol (VENTOLIN HFA) 108 (90 Base) MCG/ACT inhaler Inhale 2 puffs into the lungs every 6 (six) hours as needed for wheezing or shortness of breath.     . AMBULATORY NON FORMULARY MEDICATION Take 100 mg by mouth daily. Medication Name: clofazimine for mycobacterium abscessus pulmonary infection 100 capsule 2  . ascorbic acid (VITAMIN C) 500 MG tablet Take 1 tablet (500 mg total) by mouth 2 (two) times daily. 30 tablet 0  . cefOXitin 8 g in dextrose 5 % 50 mL Inject 8 g into the vein continuous. 8 g 11  . fluconazole (DIFLUCAN) 100 MG tablet Take 2 tablets (200 mg total) by mouth daily. 20 tablet 0  . guaiFENesin-dextromethorphan (ROBITUSSIN DM) 100-10 MG/5ML syrup Take 5 mLs by mouth every 4 (four) hours as needed for cough. 118 mL 0  . Ipratropium-Albuterol (COMBIVENT) 20-100 MCG/ACT AERS respimat Inhale 2 puffs into the lungs every 6 (six) hours. 4 g 0  . lidocaine-prilocaine (EMLA) cream Apply 1 application topically as needed. (Patient taking differently: Apply 1 application topically as needed (access port). ) 30 g 0  . linezolid (ZYVOX) 600 MG tablet Take 1 tablet (600  mg total) by mouth daily. 30 tablet 11  . mometasone-formoterol (DULERA) 100-5 MCG/ACT AERO Inhale 2 puffs into the lungs 2 (two) times daily as needed for wheezing or shortness of breath.     . mupirocin ointment (BACTROBAN) 2 % Apply 1 application topically 2 (two) times daily. 30 g 2  . Polyethyl Glycol-Propyl Glycol (SYSTANE OP) Place 1 drop into both eyes daily as needed (dry eyes).    . torsemide (DEMADEX) 5 MG tablet Take 5 mg by mouth daily.    . traMADol (ULTRAM) 50 MG tablet Take 50 mg by mouth 2 (two) times daily as needed for moderate pain.     Marland Kitchen zinc sulfate 220 (50 Zn) MG capsule Take 1 capsule (220 mg total) by mouth daily.     No  current facility-administered medications for this visit.    Physical Exam BP (!) 147/72   Pulse 94   Temp 99.1 F (37.3 C) (Skin)   Resp 20   Ht _0  (1.651 m)   Wt 91 lb (41.3 kg)   SpO2 93% Comment: RA  BMI 15.61 kg/m  79 year old woman in no acute distress Alert and oriented x3, resting tremor, no focal motor deficit Lungs with faint wheezing bilaterally Cardiac regular rate and rhythm  Diagnostic Tests: CT CHEST WITHOUT CONTRAST  TECHNIQUE: Multidetector CT imaging of the chest was performed following the standard protocol without IV contrast.  COMPARISON:  CT chest, 04/10/2019, PET-CT, 01/23/2019  FINDINGS: Cardiovascular: Aortic atherosclerosis. Normal heart size. Left coronary artery calcifications. No pericardial effusion. Right chest port catheter  Mediastinum/Nodes: No enlarged mediastinal, hilar, or axillary lymph nodes. No significant change in a soft tissue mass of the anterior mediastinum measuring approximately 3.6 x 1.5 cm, which is not well distinguished from adjacent vessels and soft tissue on noncontrast examination (series 2, image 42). Calcified nodes in keeping with prior granulomatous infection thyroid gland, trachea, and esophagus demonstrate no significant findings.  Lungs/Pleura: Centrilobular emphysema. No significant change in a large cavitary lesion of the posterior right upper lobe and superior segment right lower lobe. There are multiple additional cavitary lesions, nodules, and clustered nodularity, generally improved compared to prior examination, for example significantly decreased cavitation of a lesion of the superior segment left lower lobe (series 8, image 44) and diminished nodularity of the superior segment right lower lobe (series 8, image 55). There is however fluctuant nodularity of the anterior lingula with at least 1 enlarged nodule measuring 6 mm (series 8, image 69) and new clustered nodularity and ground-glass  of the dependent left lower lobe (series 8, image 111). No pleural effusion or pneumothorax.  Upper Abdomen: No acute abnormality.  Musculoskeletal: No chest wall mass or suspicious bone lesions identified.  IMPRESSION: 1. No significant change in a large cavitary lesion of the posterior right upper lobe and superior segment right lower lobe.  2. There are multiple additional smaller cavitary lesions, nodules, and clustered nodularity, generally improved and with decreased cavitation, however there is new and fluctuant nodularity of the lingula and dependent left lower lobe. The overall constellation is of improved although ongoing atypical infection.  3. No significant change in a soft tissue mass of the anterior mediastinum, not well distinguished from adjacent vessels in soft tissue on noncontrast examination. This remains concerning for malignancy although may reflect fibrosing mediastinitis in the setting of atypical mycobacterial infection.  4.  Emphysema (ICD10-J43.9).  5.  Coronary artery disease.  Aortic Atherosclerosis (ICD10-I70.0).   Electronically Signed   By: Cristie Hem  Laqueta Carina M.D.   On: 06/08/2019 10:28 I personally reviewed the CT images and compared to her previous films.  I concur with the findings noted above.  Impression: Olivia Hooper is a 79 year old woman with rheumatoid arthritis, MAC, tobacco abuse, COPD, left breast cancer, hyperparathyroidism, hyperlipidemia, osteoporosis, and vertigo.  She presented last fall with hemoptysis.  Work-up revealed a cavitary lung mass.  Bronchoscopy with biopsy showed granulomas and cultures grew out MAC.  She currently is on triple IV antibiotics with a plan for 1 year of treatment.  She has been off and on the antibiotics to some degree because of diarrhea and other side effects.  She is not currently on any medications for rheumatoid arthritis.  She had been on Cimzia previously but that likely was the underlying  cause for her developing a MAC infection.  She has had some waxing and waning of lung nodules.  The primary large cavitary lesion in the right upper lobe is relatively stable.  Other areas have improved.  She has a new nodule on the left in the lingula.  Overall relatively stable picture.  Needs to continue with follow-up.  Anterior mediastinal soft tissue density-really unchanged dating back to October.  This would argue against it being malignant, but does not completely rule out that possibility.  Also possible that it less aggressive thymoma.  The most likely scenario is its adenopathy related to her MAC.  Because of the difficulty with access for biopsy, which require either a sternotomy or a VATS approach, and the stability I recommended that we just continue to follow this for now.  Plan: Return in 3 months with repeat CT of chest  Melrose Nakayama, MD [facility] [phone]

## 2019-06-14 ENCOUNTER — Other Ambulatory Visit: Payer: Self-pay

## 2019-06-14 ENCOUNTER — Inpatient Hospital Stay (HOSPITAL_BASED_OUTPATIENT_CLINIC_OR_DEPARTMENT_OTHER): Payer: Medicare Other | Admitting: Nurse Practitioner

## 2019-06-14 ENCOUNTER — Inpatient Hospital Stay: Payer: Medicare Other | Attending: Nurse Practitioner

## 2019-06-14 ENCOUNTER — Other Ambulatory Visit: Payer: Self-pay | Admitting: *Deleted

## 2019-06-14 VITALS — BP 124/64 | HR 89 | Temp 98.0°F | Resp 18 | Ht 61.5 in | Wt 89.5 lb

## 2019-06-14 DIAGNOSIS — A310 Pulmonary mycobacterial infection: Secondary | ICD-10-CM | POA: Diagnosis not present

## 2019-06-14 DIAGNOSIS — J9601 Acute respiratory failure with hypoxia: Secondary | ICD-10-CM | POA: Diagnosis not present

## 2019-06-14 DIAGNOSIS — R600 Localized edema: Secondary | ICD-10-CM

## 2019-06-14 DIAGNOSIS — D649 Anemia, unspecified: Secondary | ICD-10-CM

## 2019-06-14 DIAGNOSIS — U071 COVID-19: Secondary | ICD-10-CM | POA: Diagnosis not present

## 2019-06-14 DIAGNOSIS — R197 Diarrhea, unspecified: Secondary | ICD-10-CM | POA: Insufficient documentation

## 2019-06-14 DIAGNOSIS — Z8616 Personal history of COVID-19: Secondary | ICD-10-CM | POA: Diagnosis not present

## 2019-06-14 DIAGNOSIS — L89312 Pressure ulcer of right buttock, stage 2: Secondary | ICD-10-CM | POA: Diagnosis not present

## 2019-06-14 DIAGNOSIS — Z853 Personal history of malignant neoplasm of breast: Secondary | ICD-10-CM | POA: Insufficient documentation

## 2019-06-14 DIAGNOSIS — Z9012 Acquired absence of left breast and nipple: Secondary | ICD-10-CM | POA: Insufficient documentation

## 2019-06-14 DIAGNOSIS — J440 Chronic obstructive pulmonary disease with acute lower respiratory infection: Secondary | ICD-10-CM | POA: Diagnosis not present

## 2019-06-14 LAB — CBC WITH DIFFERENTIAL (CANCER CENTER ONLY)
Abs Immature Granulocytes: 0.02 10*3/uL (ref 0.00–0.07)
Basophils Absolute: 0.1 10*3/uL (ref 0.0–0.1)
Basophils Relative: 1 %
Eosinophils Absolute: 0.5 10*3/uL (ref 0.0–0.5)
Eosinophils Relative: 6 %
HCT: 31.5 % — ABNORMAL LOW (ref 36.0–46.0)
Hemoglobin: 9.7 g/dL — ABNORMAL LOW (ref 12.0–15.0)
Immature Granulocytes: 0 %
Lymphocytes Relative: 24 %
Lymphs Abs: 1.8 10*3/uL (ref 0.7–4.0)
MCH: 27.6 pg (ref 26.0–34.0)
MCHC: 30.8 g/dL (ref 30.0–36.0)
MCV: 89.7 fL (ref 80.0–100.0)
Monocytes Absolute: 0.9 10*3/uL (ref 0.1–1.0)
Monocytes Relative: 12 %
Neutro Abs: 4.4 10*3/uL (ref 1.7–7.7)
Neutrophils Relative %: 57 %
Platelet Count: 227 10*3/uL (ref 150–400)
RBC: 3.51 MIL/uL — ABNORMAL LOW (ref 3.87–5.11)
RDW: 20.2 % — ABNORMAL HIGH (ref 11.5–15.5)
WBC Count: 7.7 10*3/uL (ref 4.0–10.5)
nRBC: 0 % (ref 0.0–0.2)

## 2019-06-14 LAB — SAMPLE TO BLOOD BANK

## 2019-06-14 LAB — RETIC PANEL
Immature Retic Fract: 8.2 % (ref 2.3–15.9)
RBC.: 3.5 MIL/uL — ABNORMAL LOW (ref 3.87–5.11)
Retic Count, Absolute: 36.8 10*3/uL (ref 19.0–186.0)
Retic Ct Pct: 1.1 % (ref 0.4–3.1)
Reticulocyte Hemoglobin: 31.9 pg (ref >27.9)

## 2019-06-14 NOTE — Progress Notes (Signed)
[place]   [phone]) [phone] Fax:(336) [phone]   Clinic Follow up Note   Patient Care Team: [name], MD as PCP - General (Internal Medicine) Deloria Lair, NP as [place] Management Date of Service: 06/14/2019   CHIEF COMPLAINT: Hospital f/u for anemia   INTERVAL HISTORY: Olivia Hooper returns for f/u as scheduled. She was initially seen by Dr. Burr Medico during hospitalization in 05/2019. Please refer to inpatient consult note on 05/18/2019. Her anemia was found during work up for pulmonary lesion that turned out to be mycobacterial infection on long term antibiotics. She had no evidence of nutritional iron/B12/folate deficiencies. 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 Zyvox and treatment for her mycobacterium infection were on hold during her hospitalization. She is followed by Dr. Tommy Medal. ID was not strongly convinced her anemia was caused by Zyvox. She restarted Zyvox once daily, diflucan once daily, and cefoxitin IV 8g/day on 05/31/19.   Today she presents with her daughter. She feels well in general, improved from the hospital. Energy is better, she is up more than half the day able to function independently except drive herself. She lives alone, has family who help, and nursing support. She gets labs checked twice weekly through [facility] for IV antibiotics. She has diarrhea from her triple therapy, takes imodium still has 3 loose/soft stools daily. Denies any bleeding. Has some DOE which is stable. She is off oxygen for the most part, only uses it briefly with exertion. Denies cough, chest pain, fever, chills. She has leg edema since she started antibiotics, denies leg pain. She elevates her legs and wears compression stockings occasionally when she can have help putting them on.    MEDICAL HISTORY:  Past Medical History:  Diagnosis Date    . Anemia 05/03/2019  . Breast cancer ([place])    on left  . Carpal tunnel syndrome   . COPD (chronic obstructive pulmonary disease) ([place])   . Drug-induced anemia 05/03/2019  . Dyspnea   . Hypercalcemia   . Hyperlipidemia   . Hyperparathyroidism ([place])   . Osteoporosis   . Pneumonia   . 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 RA (rheumatoid arthritis) ([place])   . Sleeping excessive 05/03/2019  . Vertigo   . Vitamin D deficiency     SURGICAL HISTORY: Past Surgical History:  Procedure Laterality Date  . CARPAL TUNNEL RELEASE    . CATARACT EXTRACTION    . IR IMAGING GUIDED PORT INSERTION  04/05/2019  . MASTECTOMY Left   . VESICOVAGINAL FISTULA CLOSURE W/ TAH    . VIDEO BRONCHOSCOPY WITH ENDOBRONCHIAL ULTRASOUND N/A 01/18/2019   Procedure: VIDEO BRONCHOSCOPY WITH ENDOBRONCHIAL ULTRASOUND;  Surgeon: Candee Furbish, MD;  Location: [facility];  Service: Thoracic;  Laterality: N/A;    I have reviewed the social history and family history with the patient and they are unchanged from previous note.  ALLERGIES:  is allergic to buprenorphine hcl and morphine and related.  MEDICATIONS:  Current Outpatient Medications  Medication Sig Dispense Refill  . acetaminophen (TYLENOL) 500 MG tablet Take 500 mg by mouth every 8 (eight) hours as needed for mild pain.     Marland Kitchen albuterol (VENTOLIN HFA) 108 (90 Base) MCG/ACT inhaler Inhale 2 puffs into the lungs every 6 (six) hours as needed for wheezing or shortness of breath.     . AMBULATORY NON FORMULARY MEDICATION Take 100  mg by mouth daily. Medication Name: clofazimine for mycobacterium abscessus pulmonary infection 100 capsule 2  . ascorbic acid (VITAMIN C) 500 MG tablet Take 1 tablet (500 mg total) by mouth 2 (two) times daily. 30 tablet 0  . cefOXitin 8 g in dextrose 5 % 50 mL Inject 8 g into the vein continuous. 8 g 11  . fluconazole (DIFLUCAN) 100 MG tablet Take 2 tablets (200 mg total) by mouth daily. 20 tablet 0  .  guaiFENesin-dextromethorphan (ROBITUSSIN DM) 100-10 MG/5ML syrup Take 5 mLs by mouth every 4 (four) hours as needed for cough. 118 mL 0  . Ipratropium-Albuterol (COMBIVENT) 20-100 MCG/ACT AERS respimat Inhale 2 puffs into the lungs every 6 (six) hours. 4 g 0  . lidocaine-prilocaine (EMLA) cream Apply 1 application topically as needed. (Patient taking differently: Apply 1 application topically as needed (access port). ) 30 g 0  . linezolid (ZYVOX) 600 MG tablet Take 1 tablet (600 mg total) by mouth daily. 30 tablet 11  . mometasone-formoterol (DULERA) 100-5 MCG/ACT AERO Inhale 2 puffs into the lungs 2 (two) times daily as needed for wheezing or shortness of breath.     . mupirocin ointment (BACTROBAN) 2 % Apply 1 application topically 2 (two) times daily. 30 g 2  . Polyethyl Glycol-Propyl Glycol (SYSTANE OP) Place 1 drop into both eyes daily as needed (dry eyes).    . torsemide (DEMADEX) 5 MG tablet Take 5 mg by mouth daily.    . traMADol (ULTRAM) 50 MG tablet Take 50 mg by mouth 2 (two) times daily as needed for moderate pain.     Marland Kitchen zinc sulfate 220 (50 Zn) MG capsule Take 1 capsule (220 mg total) by mouth daily.     No current facility-administered medications for this visit.    PHYSICAL EXAMINATION: ECOG PERFORMANCE STATUS: 1 - Symptomatic but completely ambulatory  Vitals:   06/14/19 1406  BP: 124/64  Pulse: 89  Resp: 18  Temp: 98 F (36.7 C)  SpO2: 95%   Filed Weights   06/14/19 1406  Weight: 89 lb 8 oz (40.6 kg)    GENERAL:alert, no distress and comfortable SKIN: no obvious rash  EYES:  sclera clear LUNGS: decreased breath sounds, normal breathing effort HEART: regular rate & rhythm, mild to moderate L>R pitting LE edema  NEURO: alert & oriented x 3 with fluent speech, normal gait Right chest PAC without erythema   LABORATORY DATA:  I have reviewed the data as listed CBC Latest Ref Rng & Units 06/14/2019 05/21/2019 05/20/2019  WBC 4.0 - 10.5 K/uL 7.7 6.0 6.8  Hemoglobin  12.0 - 15.0 g/dL 9.7(L) 9.7(L) 9.3(L)  Hematocrit 36.0 - 46.0 % 31.5(L) 31.0(L) 29.3(L)  Platelets 150 - 400 K/uL 227 133(L) 135(L)     RADIOGRAPHIC STUDIES: I have personally reviewed the radiological images as listed and agreed with the findings in the report. No results found.   ASSESSMENT & PLAN: 79 yo female with   1. Anemia of unknown chronicity, anemia of chronic disease/infection  -found to be 10.1 in 12/2018 during work up for pulmonary lesion that turned out of be mycobacterial infection, has progressed since she started antibiotics Zyvox in 03/2019, Hgb 6-7 range during hospitalization. She also had COVID19 infection recently in 05/2019 -hospital work up in 05/2019 showed no nutritional iron/B12/folate deficiencies. No lab evidence of hemolysis. Reticulocyte count was very low, indicating bone marrow suppression likely from chronic infection and antibiotics. She came off Zyvox temporarily but has restarted therapy in 05/2019, followed by  Dr. Tommy Medal   -No other cytopenias, WBC and PLT count have been normal, primary bone marrow disease such as MDS is less likely. We are not recommending bone marrow biopsy at this time -Her anemia is likely anemia of chronic disease/inflammation and subsequent treatment -Ms. Mergenthaler appears stable. She is recovering from hospitalization and continues triplet therapy for her lung infection. Does not require continueous oxygen. Labs reviewed. Hgb 9.7 today. She is minimally symptomatic with DOE, otherwise she is doing well.  -We discussed treatment including close monitoring for now, if Hgb drops <9 we will consider starting Epo injections, dose and frequency would depend on her Hgb and response to treatment. We reviewed potential side effects including risk of thrombosis and potential growth of existing malignancy which the patient does not have. She agrees to proceed if needed -Given the consideration for this medication in the future and her current lower  extremity edema, I obtained bilat doppler to r/o DVT which is negative. Her leg edema is likely related to low mobility and nutrition. I encouraged her to use compression stockings.  - We will consider transfusion if Hgb <8 and/or symptomatic  -She will get her labs checked twice weekly per [facility] home health, they will fax the records so we can monitor her anemia closely.  -F/u in 6 weeks, or sooner if needed. I reviewed the plan with Dr. 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  -Bronchoscopy by Dr. Tamala Julian with cytology showed AFB fungal stains, ultimately mycobacterium abscessus species grew out - Followed by Dr. Tommy Medal; anticipated treatment duration for 1 year as this is very difficult to treat  -currently on triple therapy with cefepime, diflucan, and Zyvox per [facility] -CT chest on 06/08/19 showed stable RUL mass, improved smaller cavitary lesions in the lungs -next f/u in 09/2019 with imaging   3. Recent COVID19 infection in 05/2019  -treated with steroids and remdesivir, uses supplemental oxygen with exertion  -resolved   4. Remote h/o left breast cancer s/p mastectomy   PLAN: -Hospital work up, ID notes, imaging, and labs reviewed  -Hgb 9.7 today which is stable, monitor -If Hgb <9 consistently will consider starting EPO -If Hgb <8 transfuse RBCs -bilat LE US/dopper, negative for DVT  -Obtain labs from [facility] to follow anemia -f/u in 6 weeks, or sooner if needed   -Continue f/u with pulmonary, ID  No problem-specific Assessment & Plan notes found for this encounter.   Orders Placed This Encounter  Procedures  . CBC with Differential (Cancer Center Only)    Standing Status:   Standing    Number of Occurrences:   50    Standing Expiration Date:   06/17/2020  . Retic Panel    Standing Status:   Standing    Number of Occurrences:   50    Standing Expiration Date:   06/17/2020  . CMP ([place] only)    Standing Status:   Standing    Number of Occurrences:   50    Standing Expiration Date:   06/17/2020   All questions were answered. The patient knows to call the clinic with any problems, questions or concerns. No barriers to learning was detected. Total encounter time was 45 minutes     Olivia Feeling, NP 06/18/19

## 2019-06-14 NOTE — Progress Notes (Signed)
Per Cira Rue, NP, called to verify if CBC could be drawn by [facility]. Spoke with [place] from [facility], confirmed, labs are drawn on Mondays and Thursdays and can be faxed to facility on Tuesdays and Fridays for review for provider.

## 2019-06-14 NOTE — Patient Outreach (Signed)
Telephone outreach.  Mrs. Penfold is home today and apologizes for not returning my calls. She reports she is doing well. She has resumed her IV antibiotic tx for the mycobacterium lung infection and the oral med also. She is tolerating this well. She does complain about the weight of the IV bag, however, she does have a system in which the bag in contained in a shoulder pouch.  She says her appetite is improving and her wt is up to 91.  She is getting along fine independently but has family visits frequently to check on her.  Encouraged her to call if any needs arise.  I will call her again in one month.  [name]. [name], MSN, [facility] Gerontological Nurse Practitioner [facility] Care Management [phone]

## 2019-06-15 ENCOUNTER — Encounter: Payer: Self-pay | Admitting: Infectious Disease

## 2019-06-15 ENCOUNTER — Telehealth: Payer: Self-pay | Admitting: Pharmacist

## 2019-06-15 DIAGNOSIS — J9601 Acute respiratory failure with hypoxia: Secondary | ICD-10-CM | POA: Diagnosis not present

## 2019-06-15 DIAGNOSIS — I10 Essential (primary) hypertension: Secondary | ICD-10-CM | POA: Diagnosis not present

## 2019-06-15 DIAGNOSIS — D649 Anemia, unspecified: Secondary | ICD-10-CM | POA: Diagnosis not present

## 2019-06-15 DIAGNOSIS — J440 Chronic obstructive pulmonary disease with acute lower respiratory infection: Secondary | ICD-10-CM | POA: Diagnosis not present

## 2019-06-15 DIAGNOSIS — A310 Pulmonary mycobacterial infection: Secondary | ICD-10-CM | POA: Diagnosis not present

## 2019-06-15 DIAGNOSIS — L89312 Pressure ulcer of right buttock, stage 2: Secondary | ICD-10-CM | POA: Diagnosis not present

## 2019-06-15 DIAGNOSIS — U071 COVID-19: Secondary | ICD-10-CM | POA: Diagnosis not present

## 2019-06-15 LAB — ERYTHROPOIETIN: Erythropoietin: 23.4 m[IU]/mL — ABNORMAL HIGH (ref 2.6–18.5)

## 2019-06-15 NOTE — Telephone Encounter (Signed)
Thanks [name] 

## 2019-06-15 NOTE — Telephone Encounter (Signed)
Reviewed patient's home health labs:  4/1: SCr 1.19; hemoglobin 9.3; platelet 205 4/5: SCr 1.02; hemoglobin 9.3; platelet 192  Will continue to monitor closely while on linezolid, cefoxitin, and clofazimine for her pulmonary mycobacterial infection.

## 2019-06-16 ENCOUNTER — Ambulatory Visit (HOSPITAL_COMMUNITY)
Admission: RE | Admit: 2019-06-16 | Discharge: 2019-06-16 | Disposition: A | Discharge status: Home / Self Care | Payer: Medicare Other | Source: Ambulatory Visit | Attending: Nurse Practitioner | Admitting: Nurse Practitioner

## 2019-06-16 ENCOUNTER — Other Ambulatory Visit: Payer: Self-pay

## 2019-06-16 DIAGNOSIS — R600 Localized edema: Secondary | ICD-10-CM | POA: Insufficient documentation

## 2019-06-16 NOTE — Progress Notes (Signed)
Lower venous duplex       has been completed. Preliminary results can be found under CV proc through chart review. Arli Bree, BS, RDMS, RVT   

## 2019-06-18 ENCOUNTER — Encounter: Payer: Self-pay | Admitting: Nurse Practitioner

## 2019-06-19 ENCOUNTER — Encounter: Payer: Self-pay | Admitting: Infectious Disease

## 2019-06-19 DIAGNOSIS — I10 Essential (primary) hypertension: Secondary | ICD-10-CM | POA: Diagnosis not present

## 2019-06-19 DIAGNOSIS — U071 COVID-19: Secondary | ICD-10-CM | POA: Diagnosis not present

## 2019-06-19 DIAGNOSIS — L89312 Pressure ulcer of right buttock, stage 2: Secondary | ICD-10-CM | POA: Diagnosis not present

## 2019-06-19 DIAGNOSIS — D649 Anemia, unspecified: Secondary | ICD-10-CM | POA: Diagnosis not present

## 2019-06-19 DIAGNOSIS — A310 Pulmonary mycobacterial infection: Secondary | ICD-10-CM | POA: Diagnosis not present

## 2019-06-19 DIAGNOSIS — J440 Chronic obstructive pulmonary disease with acute lower respiratory infection: Secondary | ICD-10-CM | POA: Diagnosis not present

## 2019-06-19 DIAGNOSIS — J9601 Acute respiratory failure with hypoxia: Secondary | ICD-10-CM | POA: Diagnosis not present

## 2019-06-20 ENCOUNTER — Telehealth: Payer: Self-pay

## 2019-06-20 NOTE — Telephone Encounter (Signed)
Lab update:  4/8: SCr 1.15; hemoglobin 9.9; platelets 233

## 2019-06-20 NOTE — Telephone Encounter (Signed)
Patient called office today to follow up on labs. States she has not heard any updates from recent lab draw; was told our office would give updates once we got results.  Patient's daughter has some concerns regarding frequency on blood draw. Darla state's that this is not necessary, and would like to go back to once a week labs.  Would also like to get information from Dr. Tommy Medal regarding specialist at [facility] that was discussed during last visit. Would like a call back.  [place]

## 2019-06-20 NOTE — Telephone Encounter (Signed)
We were worried about her hemoglobin dropping which it has not   If they want to go to once weekly I'm ok w that just keeping in mind we were trying to be extra careful   I brought up Olivia Hooper months ago but this is the first I have heard from them wanting referral to him  If they want the referral to him I will make it  Olivia Hooper have you heard them asking this recently?   They may need to set up a phone v irtual visit to discuss further

## 2019-06-21 NOTE — Telephone Encounter (Signed)
No, they have not mentioned it to me. I asked for twice weekly labs to watch her hemoglobin and SCr as the last time it was such a mess but they have been good lately. I wish they would stay twice weekly but I understand it is cumbersome. This is the first I have heard about [facility].

## 2019-06-21 NOTE — Telephone Encounter (Signed)
Attempted to call patient's daughter with message from MD. Left voicemail requesting call back. Will need to schedule appointment for patient with MD to discuss labs and lab frequency.  [place]

## 2019-06-22 ENCOUNTER — Encounter: Payer: Self-pay | Admitting: Infectious Disease

## 2019-06-22 DIAGNOSIS — J440 Chronic obstructive pulmonary disease with acute lower respiratory infection: Secondary | ICD-10-CM | POA: Diagnosis not present

## 2019-06-22 DIAGNOSIS — D649 Anemia, unspecified: Secondary | ICD-10-CM | POA: Diagnosis not present

## 2019-06-22 DIAGNOSIS — L89312 Pressure ulcer of right buttock, stage 2: Secondary | ICD-10-CM | POA: Diagnosis not present

## 2019-06-22 DIAGNOSIS — U071 COVID-19: Secondary | ICD-10-CM | POA: Diagnosis not present

## 2019-06-22 DIAGNOSIS — A310 Pulmonary mycobacterial infection: Secondary | ICD-10-CM | POA: Diagnosis not present

## 2019-06-22 DIAGNOSIS — J9601 Acute respiratory failure with hypoxia: Secondary | ICD-10-CM | POA: Diagnosis not present

## 2019-06-22 DIAGNOSIS — I10 Essential (primary) hypertension: Secondary | ICD-10-CM | POA: Diagnosis not present

## 2019-06-26 ENCOUNTER — Ambulatory Visit (INDEPENDENT_AMBULATORY_CARE_PROVIDER_SITE_OTHER): Payer: Medicare Other | Admitting: Infectious Disease

## 2019-06-26 ENCOUNTER — Other Ambulatory Visit: Payer: Self-pay

## 2019-06-26 ENCOUNTER — Telehealth: Payer: Self-pay

## 2019-06-26 ENCOUNTER — Encounter: Payer: Self-pay | Admitting: Infectious Disease

## 2019-06-26 ENCOUNTER — Other Ambulatory Visit: Payer: Self-pay | Admitting: Infectious Disease

## 2019-06-26 DIAGNOSIS — U071 COVID-19: Secondary | ICD-10-CM | POA: Diagnosis not present

## 2019-06-26 DIAGNOSIS — A310 Pulmonary mycobacterial infection: Secondary | ICD-10-CM

## 2019-06-26 DIAGNOSIS — J440 Chronic obstructive pulmonary disease with acute lower respiratory infection: Secondary | ICD-10-CM | POA: Diagnosis not present

## 2019-06-26 DIAGNOSIS — D649 Anemia, unspecified: Secondary | ICD-10-CM

## 2019-06-26 DIAGNOSIS — J984 Other disorders of lung: Secondary | ICD-10-CM

## 2019-06-26 DIAGNOSIS — L89312 Pressure ulcer of right buttock, stage 2: Secondary | ICD-10-CM | POA: Diagnosis not present

## 2019-06-26 DIAGNOSIS — J9601 Acute respiratory failure with hypoxia: Secondary | ICD-10-CM | POA: Diagnosis not present

## 2019-06-26 NOTE — Telephone Encounter (Signed)
Received call from home health RN, Langley Gauss, stating that upon arriving at patient's home for visit, she was not hooked up to her antibiotics. Patient stated she has not been on them since Friday. RN offered to restart the medications for her and patient refused. RN unsure how the patient has been administering medications. Will forward to provider.   Wilmina Maxham Lorita Officer, RN

## 2019-06-26 NOTE — Progress Notes (Signed)
Virtual Visit via Telephone Note  I connected with Olivia Hooper on 06/26/19 at  4:00 PM EDT by telephone and verified that I am speaking with the correct person using two identifiers.  Location: Patient: Home Provider: [facility]   I discussed the limitations, risks, security and privacy concerns of performing an evaluation and management service by telephone and the availability of in person appointments. I also discussed with the patient that there may be a patient responsible charge related to this service. The patient expressed understanding and agreed to proceed.   History of Present Illness: -year-old Caucasian lady with history of tobacco use chronic obstructive pulmonary disease severe rheumatoid arthritis, left breast cancer who has been on immunosuppressive therapy. She apparently had a 1 month history of hemoptysis. X-ray had shown a right upper lobe lung mass CT had shown 6.5 x 5.2 x 7.47 manner cavitary mass in the right upper lobe. There are multiple small lung nodules as well. There is also a small anterior mediastinal mass.  Patient underwent bronchoscopy with Dr. Tamala Julian with cytology is AFB fungal stains and cultures. 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 In the interval she had a PET CT scan that showed the cavitary mass which was very hypermetabolic patchy tree-in-bud opacities in both lungs there were hypermetabolic and a 3.9 1.6 cm anterior mediastinal soft tissue mass that was hypermetabolic.  There was anxiety about patient potentially having a thymoma but at present she was felt to be a high risk surgical patient when seen by Dr. Salomon Fick with cardiothoracic surgery.  I also agree with him that this mediastinal mass could be due to the mycobacterial abscessus infection.  These organisms are not notoriously difficult to treat still to have some anxiety the patient might need some surgical intervention to gain control of her chest  pathology.  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  She hadlost 10 pounds of weight over the last half year.  She has stopped smoking now.  We crafted a regimen to treat her Mycobacterium abscessus with once daily clofazimine twice daily Zyvox and IV cefoxitin via continuous infusion. When I saw her in late February she was5 weeks into treatment. She has been feeling progressively more fatigued and sleeping excessively according to both her and her son who accompanied her.  I noticed that she had dropped her hemoglobin down into the range of 7.4 after being 10. I was concerned that Zyvox might have been causing some myelosuppression though her platelets had not dropped and we dropped her Zyvox dose to once daily. In the interim she experienced worsening fatigue and anemia and was admitted hospital. Her hemoglobin had dropped below 7 into the 6 range. She was found to have COVID-19 and was treated with steroids and remdesivir her antibiotics were stopped altogether. She was seen by my partner Dr. Linus Salmons and and he an alternate regimen was considered including omadacycline.  Unfortunately even omadacycline was approved her cost for drugs was still 7000 due to the nature of her insurance.  Crafting of a new regimen was deferred until she was in the outpatient world.  We ended up deciding after careful discussion with the patient and with [name] go back to a regimen of IV cefoxitin with a lower dose Zyvox and continued clofazimine.  We have been watching her labs twice weekly and her CBC has shown a stable hemoglobin and platelets have also  not dropped.  Since I last saw the patient on April 5 she has become increasingly frustrated with the continuous cefoxitin infusion and having difficulty tolerating and feeling much worse while having the infusion.  Her son and  daughter who are also on the call did say that at times that "treatment is worse in the disease".  This past Friday they stopped all treatment as she was feeling particularly poorly and then on the family members around 2 help coach her through treatment.  They are also frustrated that the labs are being drawn twice weekly but that they are not being called twice weekly with the lab results as was the case the first time we began checking labs twice weekly.  I emphasized to them that it was not realistic for our clinic to have a practice in which we called every patient who was on IV antibiotics with the results weekly let alone twice weekly and that we had done this in their particular case out of an over abundance of caution given what had transpired with her hemoglobin having dropped below 7 requiring a blood transfusion.  They also very much would like to be referred to Dr. Lorenda Cahill at [facility] to see what his take on the cases.  They said they do not want to transfer care to him but would like to see what he thinks in terms of what would be the best option for the the patient.  They would prefer labs were just drawn once weekly I think that is reasonable especially since labs have been stable.    Past Medical History:  Diagnosis Date  . Anemia 05/03/2019  . Breast cancer ([place])    on left  . Carpal tunnel syndrome   . COPD (chronic obstructive pulmonary disease) ([place])   . Drug-induced anemia 05/03/2019  . Dyspnea   . Hypercalcemia   . Hyperlipidemia   . Hyperparathyroidism ([place])   . Osteoporosis   . Pneumonia   . Pulmonary mycobacterial infection ([place]) 03/14/2019  . RA (rheumatoid arthritis) ([place])   . Sleeping excessive 05/03/2019  . Vertigo   . Vitamin D deficiency     Past Surgical History:  Procedure Laterality Date  . CARPAL TUNNEL RELEASE    . CATARACT EXTRACTION    . IR IMAGING GUIDED PORT INSERTION  04/05/2019  . MASTECTOMY Left   . VESICOVAGINAL FISTULA CLOSURE W/ TAH    .  VIDEO BRONCHOSCOPY WITH ENDOBRONCHIAL ULTRASOUND N/A 01/18/2019   Procedure: VIDEO BRONCHOSCOPY WITH ENDOBRONCHIAL ULTRASOUND;  Surgeon: Candee Furbish, MD;  Location: [facility] OR;  Service: Thoracic;  Laterality: N/A;    Family History  Problem Relation Age of Onset  . Heart disease Father   . Bone cancer Father   . Emphysema Father        never smoker  . Heart disease Mother   . Colon cancer Mother   . Melanoma Sister   . Kidney cancer Brother       Social History   Socioeconomic History  . Marital status: Widowed    Spouse name: Not on file  . Number of children: 4  . Years of education: 12+  . Highest education level: Associate degree: occupational, Hotel manager, or vocational program  Occupational History  . Occupation: Retired   Tobacco Use  . Smoking status: Former Smoker    Packs/day: 0.25    Years: 62.00    Pack years: 15.50    Types: Cigarettes    Quit date: 01/12/2019  Years since quitting: 0.4  . Smokeless tobacco: Never Used  Substance and Sexual Activity  . Alcohol use: No    Alcohol/week: 0.0 standard drinks  . Drug use: No  . Sexual activity: Not on file  Other Topics Concern  . Not on file  Social History Narrative   Lives alone, 4 children, independent.   Social Determinants of Health   Financial Resource Strain: Low Risk   . Difficulty of Paying Living Expenses: Not hard at all  Food Insecurity: No Food Insecurity  . Worried About Charity fundraiser in the Last Year: Never true  . Ran Out of Food in the Last Year: Never true  Transportation Needs: No Transportation Needs  . Lack of Transportation (Medical): No  . Lack of Transportation (Non-Medical): No  Physical Activity: Inactive  . Days of Exercise per Week: 0 days  . Minutes of Exercise per Session: 0 min  Stress: No Stress Concern Present  . Feeling of Stress : Only a little  Social Connections: Slightly Isolated  . Frequency of Communication with Friends and Family: More than three  times a week  . Frequency of Social Gatherings with Friends and Family: More than three times a week  . Attends Religious Services: More than 4 times per year  . Active Member of Clubs or Organizations: Yes  . Attends Archivist Meetings: More than 4 times per year  . Marital Status: Widowed    Allergies  Allergen Reactions  . Buprenorphine Hcl Nausea And Vomiting  . Morphine And Related Nausea And Vomiting          Current Outpatient Medications:  .  acetaminophen (TYLENOL) 500 MG tablet, Take 500 mg by mouth every 8 (eight) hours as needed for mild pain. , Disp: , Rfl:  .  albuterol (VENTOLIN HFA) 108 (90 Base) MCG/ACT inhaler, Inhale 2 puffs into the lungs every 6 (six) hours as needed for wheezing or shortness of breath. , Disp: , Rfl:  .  AMBULATORY NON FORMULARY MEDICATION, Take 100 mg by mouth daily. Medication Name: clofazimine for mycobacterium abscessus pulmonary infection, Disp: 100 capsule, Rfl: 2 .  ascorbic acid (VITAMIN C) 500 MG tablet, Take 1 tablet (500 mg total) by mouth 2 (two) times daily., Disp: 30 tablet, Rfl: 0 .  cefOXitin 8 g in dextrose 5 % 50 mL, Inject 8 g into the vein continuous., Disp: 8 g, Rfl: 11 .  fluconazole (DIFLUCAN) 100 MG tablet, Take 2 tablets (200 mg total) by mouth daily., Disp: 20 tablet, Rfl: 0 .  guaiFENesin-dextromethorphan (ROBITUSSIN DM) 100-10 MG/5ML syrup, Take 5 mLs by mouth every 4 (four) hours as needed for cough., Disp: 118 mL, Rfl: 0 .  Ipratropium-Albuterol (COMBIVENT) 20-100 MCG/ACT AERS respimat, Inhale 2 puffs into the lungs every 6 (six) hours., Disp: 4 g, Rfl: 0 .  lidocaine-prilocaine (EMLA) cream, Apply 1 application topically as needed. (Patient taking differently: Apply 1 application topically as needed (access port). ), Disp: 30 g, Rfl: 0 .  linezolid (ZYVOX) 600 MG tablet, Take 1 tablet (600 mg total) by mouth daily., Disp: 30 tablet, Rfl: 11 .  mometasone-formoterol (DULERA) 100-5 MCG/ACT AERO, Inhale 2 puffs  into the lungs 2 (two) times daily as needed for wheezing or shortness of breath. , Disp: , Rfl:  .  mupirocin ointment (BACTROBAN) 2 %, Apply 1 application topically 2 (two) times daily., Disp: 30 g, Rfl: 2 .  Polyethyl Glycol-Propyl Glycol (SYSTANE OP), Place 1 drop into both eyes daily  as needed (dry eyes)., Disp: , Rfl:  .  torsemide (DEMADEX) 5 MG tablet, Take 5 mg by mouth daily., Disp: , Rfl:  .  traMADol (ULTRAM) 50 MG tablet, Take 50 mg by mouth 2 (two) times daily as needed for moderate pain. , Disp: , Rfl:  .  zinc sulfate 220 (50 Zn) MG capsule, Take 1 capsule (220 mg total) by mouth daily., Disp:  , Rfl:      Observations/Objective:  Patient was audible during the phone call and did identify her cell phone residence over the phone and did contribute to some of the conversation though much of it was through her son and daughter.    Assessment and Plan:  Mycobacterium abscessus pulmonary infection: Patient and family wish to resume treatment which we will do now but with weekly labs.  They also wish for the patient to be seen at [facility] by Dr. Brigitte Pulse who has particular expertise in mycobacterial infections.  I have made referral to Dr. Dewayne Hatch I will email him as well.  Perhaps the best option ultimately may be to switch her to an all oral regimen of omadacycline Zyvox and clofazimine though cost was high with this regimen  Follow Up Instructions:    I discussed the assessment and treatment plan with the patient. The patient was provided an opportunity to ask questions and all were answered. The patient agreed with the plan and demonstrated an understanding of the instructions.   The patient was advised to call back or seek an in-person evaluation if the symptoms worsen or if the condition fails to improve as anticipated.  I provided 21 minutes of non-face-to-face time during this encounter.   Alcide Evener, MD

## 2019-06-26 NOTE — Telephone Encounter (Signed)
TC to pts daughter (darla Radell [phone]) per Cira Rue NP to let her know that  Her mothers doppler is negative for DVT. Leg edema is likely positional and nutritional, low albumin. Encourage her to wear compression stockings and elevate legs.  Also please let her know we will be getting weekly labs from [facility], and we will monitor her anemia that way. We will see her back in 6 weeks, or sooner if needed. Daughter verbalized understanding. No further problems or concerns at this time.

## 2019-06-27 ENCOUNTER — Ambulatory Visit (INDEPENDENT_AMBULATORY_CARE_PROVIDER_SITE_OTHER): Payer: Medicare Other

## 2019-06-27 ENCOUNTER — Ambulatory Visit (INDEPENDENT_AMBULATORY_CARE_PROVIDER_SITE_OTHER): Payer: Medicare Other | Admitting: Podiatry

## 2019-06-27 ENCOUNTER — Other Ambulatory Visit: Payer: Self-pay

## 2019-06-27 DIAGNOSIS — L97501 Non-pressure chronic ulcer of other part of unspecified foot limited to breakdown of skin: Secondary | ICD-10-CM | POA: Diagnosis not present

## 2019-06-27 DIAGNOSIS — L97521 Non-pressure chronic ulcer of other part of left foot limited to breakdown of skin: Secondary | ICD-10-CM | POA: Diagnosis not present

## 2019-06-27 DIAGNOSIS — T148XXA Other injury of unspecified body region, initial encounter: Secondary | ICD-10-CM | POA: Diagnosis not present

## 2019-06-28 ENCOUNTER — Telehealth: Payer: Self-pay | Admitting: *Deleted

## 2019-06-28 ENCOUNTER — Telehealth: Payer: Self-pay

## 2019-06-28 NOTE — Telephone Encounter (Signed)
Olivia Hooper, Pharmacist with [facility] called office with lab results. States patient's Hemoglobin was 8.9 and RBC: 3.12 on 4/19. Also states that on 4/12 Hemoglobin was 9.6. Would like to update provider. [place]

## 2019-06-28 NOTE — Telephone Encounter (Signed)
Relayed this week's lab results to patient. Per Dr Tommy Medal, hemoglobin has dropped a little, but ok to watch until lab next week. Olivia Hooper says she feels ok, will call if she develops any shortness of breath or any other concerning symptoms. Her eosinophils were elevated a little per Dr Tommy Medal - patient denies any seasonal allergies or rashes at this time.  She does report that her left hand felt really bad this week with RA flare (pain, swollen). Took a 5 mg prednisone last night for the pain in her hand, says it helped her sleep last night. This is ok sparingly per Dr Tommy Medal, needs to follow up with her rheumatologist for this. Landis Gandy, RN

## 2019-06-28 NOTE — Telephone Encounter (Signed)
Thanks Warren, Spelter and I also reviewed these labs. While the hgb is down I do not think it is signficant enough for [place] to make any interventions. Also reviewed her eosinophils being up and her Alk phosph up. We can check labs in another week

## 2019-06-28 NOTE — Progress Notes (Signed)
Subjective: 79 year old female presents the office today for follow-up evaluation left foot blister, callus to her left foot.  Rates has been doing well.  She has no new concerns.  Since I last saw her she did get diagnosed with Covid. Denies any systemic complaints such as fevers, chills, nausea, vomiting. No acute changes since last appointment, and no other complaints at this time.   Objective: AAO x3, NAD DP/PT pulses palpable bilaterally, CRT less than 3 seconds On the left hallux hyperkeratotic lesion medial aspect Proximal to the calluses of old hemorrhagic blister.  Is able to presents today and new, healthy skin was present and only a small pinpoint superficial granular wound was identified.  There is no probing, undermining or tunneling.  There is no edema, erythema, drainage or pus or extremity cellulitis. No open lesions or pre-ulcerative lesions.  No pain with calf compression, swelling, warmth, erythema  Assessment: Left hallux superficial small hallux ulceration  Plan: -All treatment options discussed with the patient including all alternatives, risks, complications.  -X-rays obtained reviewed.  No definitive evidence of osteomyelitis no soft tissue edema. -Debrided hyperkeratotic tissue, blister to reveal a small superficial granular wound on left side.  Recommend a small amount of antibiotic ointment daily.  Monitor for signs or symptoms of infection.  Offloading.  Cambria Slade DPM

## 2019-06-29 DIAGNOSIS — J440 Chronic obstructive pulmonary disease with acute lower respiratory infection: Secondary | ICD-10-CM | POA: Diagnosis not present

## 2019-06-29 DIAGNOSIS — U071 COVID-19: Secondary | ICD-10-CM | POA: Diagnosis not present

## 2019-06-29 DIAGNOSIS — A310 Pulmonary mycobacterial infection: Secondary | ICD-10-CM | POA: Diagnosis not present

## 2019-06-29 DIAGNOSIS — L89312 Pressure ulcer of right buttock, stage 2: Secondary | ICD-10-CM | POA: Diagnosis not present

## 2019-06-29 DIAGNOSIS — D649 Anemia, unspecified: Secondary | ICD-10-CM | POA: Diagnosis not present

## 2019-06-29 DIAGNOSIS — J9601 Acute respiratory failure with hypoxia: Secondary | ICD-10-CM | POA: Diagnosis not present

## 2019-07-02 DIAGNOSIS — Z853 Personal history of malignant neoplasm of breast: Secondary | ICD-10-CM | POA: Diagnosis not present

## 2019-07-02 DIAGNOSIS — R918 Other nonspecific abnormal finding of lung field: Secondary | ICD-10-CM | POA: Diagnosis not present

## 2019-07-02 DIAGNOSIS — U071 COVID-19: Secondary | ICD-10-CM | POA: Diagnosis not present

## 2019-07-02 DIAGNOSIS — E43 Unspecified severe protein-calorie malnutrition: Secondary | ICD-10-CM | POA: Diagnosis not present

## 2019-07-02 DIAGNOSIS — H353130 Nonexudative age-related macular degeneration, bilateral, stage unspecified: Secondary | ICD-10-CM | POA: Diagnosis not present

## 2019-07-02 DIAGNOSIS — D649 Anemia, unspecified: Secondary | ICD-10-CM | POA: Diagnosis not present

## 2019-07-02 DIAGNOSIS — I517 Cardiomegaly: Secondary | ICD-10-CM | POA: Diagnosis not present

## 2019-07-02 DIAGNOSIS — I700 Atherosclerosis of aorta: Secondary | ICD-10-CM | POA: Diagnosis not present

## 2019-07-02 DIAGNOSIS — N179 Acute kidney failure, unspecified: Secondary | ICD-10-CM | POA: Diagnosis not present

## 2019-07-02 DIAGNOSIS — A310 Pulmonary mycobacterial infection: Secondary | ICD-10-CM | POA: Diagnosis not present

## 2019-07-02 DIAGNOSIS — Z9981 Dependence on supplemental oxygen: Secondary | ICD-10-CM | POA: Diagnosis not present

## 2019-07-02 DIAGNOSIS — I5189 Other ill-defined heart diseases: Secondary | ICD-10-CM | POA: Diagnosis not present

## 2019-07-02 DIAGNOSIS — L89312 Pressure ulcer of right buttock, stage 2: Secondary | ICD-10-CM | POA: Diagnosis not present

## 2019-07-02 DIAGNOSIS — J9601 Acute respiratory failure with hypoxia: Secondary | ICD-10-CM | POA: Diagnosis not present

## 2019-07-02 DIAGNOSIS — Z792 Long term (current) use of antibiotics: Secondary | ICD-10-CM | POA: Diagnosis not present

## 2019-07-02 DIAGNOSIS — Z452 Encounter for adjustment and management of vascular access device: Secondary | ICD-10-CM | POA: Diagnosis not present

## 2019-07-02 DIAGNOSIS — M069 Rheumatoid arthritis, unspecified: Secondary | ICD-10-CM | POA: Diagnosis not present

## 2019-07-02 DIAGNOSIS — I051 Rheumatic mitral insufficiency: Secondary | ICD-10-CM | POA: Diagnosis not present

## 2019-07-02 DIAGNOSIS — J440 Chronic obstructive pulmonary disease with acute lower respiratory infection: Secondary | ICD-10-CM | POA: Diagnosis not present

## 2019-07-02 DIAGNOSIS — Z87891 Personal history of nicotine dependence: Secondary | ICD-10-CM | POA: Diagnosis not present

## 2019-07-02 DIAGNOSIS — E559 Vitamin D deficiency, unspecified: Secondary | ICD-10-CM | POA: Diagnosis not present

## 2019-07-02 DIAGNOSIS — M810 Age-related osteoporosis without current pathological fracture: Secondary | ICD-10-CM | POA: Diagnosis not present

## 2019-07-02 DIAGNOSIS — E785 Hyperlipidemia, unspecified: Secondary | ICD-10-CM | POA: Diagnosis not present

## 2019-07-02 DIAGNOSIS — E213 Hyperparathyroidism, unspecified: Secondary | ICD-10-CM | POA: Diagnosis not present

## 2019-07-02 DIAGNOSIS — Z5181 Encounter for therapeutic drug level monitoring: Secondary | ICD-10-CM | POA: Diagnosis not present

## 2019-07-03 ENCOUNTER — Encounter: Payer: Self-pay | Admitting: Infectious Disease

## 2019-07-03 ENCOUNTER — Encounter: Payer: Self-pay | Admitting: Internal Medicine

## 2019-07-03 ENCOUNTER — Telehealth: Payer: Self-pay | Admitting: Pharmacist

## 2019-07-03 DIAGNOSIS — J9601 Acute respiratory failure with hypoxia: Secondary | ICD-10-CM | POA: Diagnosis not present

## 2019-07-03 DIAGNOSIS — J440 Chronic obstructive pulmonary disease with acute lower respiratory infection: Secondary | ICD-10-CM | POA: Diagnosis not present

## 2019-07-03 DIAGNOSIS — U071 COVID-19: Secondary | ICD-10-CM | POA: Diagnosis not present

## 2019-07-03 DIAGNOSIS — D649 Anemia, unspecified: Secondary | ICD-10-CM | POA: Diagnosis not present

## 2019-07-03 DIAGNOSIS — A310 Pulmonary mycobacterial infection: Secondary | ICD-10-CM | POA: Diagnosis not present

## 2019-07-03 DIAGNOSIS — L89312 Pressure ulcer of right buttock, stage 2: Secondary | ICD-10-CM | POA: Diagnosis not present

## 2019-07-03 NOTE — Telephone Encounter (Signed)
Requested refill for clofazimine from [facility]. Medication should arrive to clinic in 7-10 business days. Will update encounter and patient when medication arrives.  

## 2019-07-05 ENCOUNTER — Telehealth: Payer: Self-pay | Admitting: *Deleted

## 2019-07-05 DIAGNOSIS — D649 Anemia, unspecified: Secondary | ICD-10-CM | POA: Diagnosis not present

## 2019-07-05 DIAGNOSIS — A310 Pulmonary mycobacterial infection: Secondary | ICD-10-CM | POA: Diagnosis not present

## 2019-07-05 DIAGNOSIS — J440 Chronic obstructive pulmonary disease with acute lower respiratory infection: Secondary | ICD-10-CM | POA: Diagnosis not present

## 2019-07-05 DIAGNOSIS — L89312 Pressure ulcer of right buttock, stage 2: Secondary | ICD-10-CM | POA: Diagnosis not present

## 2019-07-05 DIAGNOSIS — J9601 Acute respiratory failure with hypoxia: Secondary | ICD-10-CM | POA: Diagnosis not present

## 2019-07-05 DIAGNOSIS — U071 COVID-19: Secondary | ICD-10-CM | POA: Diagnosis not present

## 2019-07-05 NOTE — Telephone Encounter (Signed)
-----   Message from Alla Feeling, NP sent at 07/05/2019  1:51 PM EDT ----- Please let her know I have reviewed her outside labs from 4/26, Hgb is 10.3 which is improved. We can see her back for lab and f/u in 6-8 weeks. If she agrees, please send schedule message.   Thanks, Regan Rakers

## 2019-07-05 NOTE — Telephone Encounter (Signed)
Per Cira Rue, NP, made pt aware of improved hgb 10.3. Recommended to be seen with labs and f/u visit with provider. Pt stated she will call after appts at [place] next month. Pt verbalized understanding

## 2019-07-05 NOTE — Telephone Encounter (Signed)
RN called with lab updates per Dr Tommy Medal.  Hemoglobin better this week, all other labs stable. Patient's questions answered to her satisfaction. She feels ok, although bogged down by dragging her IV pole everywhere she goes.  She is seeing Dr Lorenda Cahill at [facility] 6/7, rescheduled her follow up with Dr Tommy Medal for 6/14. Landis Gandy, RN

## 2019-07-10 DIAGNOSIS — D649 Anemia, unspecified: Secondary | ICD-10-CM | POA: Diagnosis not present

## 2019-07-10 DIAGNOSIS — L89312 Pressure ulcer of right buttock, stage 2: Secondary | ICD-10-CM | POA: Diagnosis not present

## 2019-07-10 DIAGNOSIS — U071 COVID-19: Secondary | ICD-10-CM | POA: Diagnosis not present

## 2019-07-10 DIAGNOSIS — J440 Chronic obstructive pulmonary disease with acute lower respiratory infection: Secondary | ICD-10-CM | POA: Diagnosis not present

## 2019-07-10 DIAGNOSIS — A310 Pulmonary mycobacterial infection: Secondary | ICD-10-CM | POA: Diagnosis not present

## 2019-07-10 DIAGNOSIS — J9601 Acute respiratory failure with hypoxia: Secondary | ICD-10-CM | POA: Diagnosis not present

## 2019-07-12 ENCOUNTER — Other Ambulatory Visit: Payer: Self-pay | Admitting: *Deleted

## 2019-07-12 NOTE — Telephone Encounter (Signed)
Clofazimine has arrived to clinic and 2 bottles are in pharmacy office for patient when needed.

## 2019-07-12 NOTE — Patient Outreach (Signed)
[facility]) Care Management  07/12/2019  Olivia Hooper [birth year] [id number]  Telephone assessment (monthly)  Olivia Hooper is doing fair, Hooper worse or better. She continues to follow her specialists instructions and treatments for her lung infection. She is going to see a new specialist at [facility] that Dr. Tommy Medal is referring her to in June.  She has been taken off the Symbicort per her report and is only taking Dulera routinely. She needs a new Rx for [employer].  She is eating fair, sleeping fair, has some depression from all the health issues she is going through. She does drink one nutritional supplement a day.  [facility] CM Care Plan Problem One     Most Recent Value  Care Plan Problem One  Complicated co-morbidities (symptomatic anemia possible related to medication for lung infection on COVID infection, respiratory failure, COPD  Role Documenting the Problem One  Care Management Coordinator  Care Plan for Problem One  Not Active  [facility] Long Term Goal   Pt will follow medical regimen including attending appts over the next 60 days by pt report and chart review..  [facility] Long Term Goal Start Date  05/31/19  [facility] Long Term Goal Met Date  07/12/19 [Met]  [facility] CM Short Term Goal #1   Pt to call NP for any problems to avoid complications over the next 30 days.  [facility] CM Short Term Goal #1 Start Date  05/31/19  [facility] CM Short Term Goal #1 Met Date  07/12/19  Interventions for Short Term Goal #1  Always reinforce to my pts to call if a problem arises early for intervention, or for any question not to hesitate.to call.    [facility] CM Care Plan Problem Two     Most Recent Value  Care Plan Problem Two  Medication adherance.  Role Documenting the Problem Two  Care Management Coordinator  Care Plan for Problem Two  Not Active  [facility] CM Short Term Goal #1   Pt will obtain meds she does not have this week. (Symbicort, additional torsemide, ?dexamethazone.)  [facility] CM Short Term Goal #1 Start Date   05/31/19  [facility] CM Short Term Goal #1 Met Date   06/14/19    [facility] CM Care Plan Problem Three     Most Recent Value  Care Plan Problem Three  Low weight  Role Documenting the Problem Three  Care Management Coordinator  Care Plan for Problem Three  Active  [facility] Long Term Goal   Pt wt will be stable at 91 (no less than 90 and no greater than 96 in one month, avoiding fluid overload.  [facility] Long Term Goal Start Date  06/14/19  Interventions for Problem Three Long Term Goal  Encouraged to try to add another 1/2 of supplement a day: drink 1/2 can at hs and then 1/2 the next am and a whole can in the afternoon. Will ask provider to order Prostat and also consider mirtazapine for mild depression, appetite and sleep improvement..     Will ask Clarene Duke NP to consider starting mirtazapine for mild depression, improve appetite and sleep.   Pt needs new Rx of Proair. Request Prostat to add to fluids or food to help with nutrition and wt gain.  I will call again in one month. Pt knows to call me for any problems.   Olivia Hooper. Olivia Neither, MSN, [facility] Gerontological Nurse Practitioner [facility] Care Management [phone]

## 2019-07-17 ENCOUNTER — Encounter: Payer: Self-pay | Admitting: Infectious Disease

## 2019-07-17 DIAGNOSIS — U071 COVID-19: Secondary | ICD-10-CM | POA: Diagnosis not present

## 2019-07-17 DIAGNOSIS — L89312 Pressure ulcer of right buttock, stage 2: Secondary | ICD-10-CM | POA: Diagnosis not present

## 2019-07-17 DIAGNOSIS — A310 Pulmonary mycobacterial infection: Secondary | ICD-10-CM | POA: Diagnosis not present

## 2019-07-17 DIAGNOSIS — D649 Anemia, unspecified: Secondary | ICD-10-CM | POA: Diagnosis not present

## 2019-07-17 DIAGNOSIS — J9601 Acute respiratory failure with hypoxia: Secondary | ICD-10-CM | POA: Diagnosis not present

## 2019-07-17 DIAGNOSIS — J440 Chronic obstructive pulmonary disease with acute lower respiratory infection: Secondary | ICD-10-CM | POA: Diagnosis not present

## 2019-07-18 ENCOUNTER — Telehealth: Payer: Self-pay | Admitting: *Deleted

## 2019-07-18 ENCOUNTER — Ambulatory Visit: Payer: Medicare Other | Admitting: Podiatry

## 2019-07-18 ENCOUNTER — Ambulatory Visit (INDEPENDENT_AMBULATORY_CARE_PROVIDER_SITE_OTHER): Payer: Medicare Other | Admitting: Podiatry

## 2019-07-18 ENCOUNTER — Other Ambulatory Visit: Payer: Self-pay

## 2019-07-18 ENCOUNTER — Ambulatory Visit (INDEPENDENT_AMBULATORY_CARE_PROVIDER_SITE_OTHER): Payer: Medicare Other

## 2019-07-18 DIAGNOSIS — L97501 Non-pressure chronic ulcer of other part of unspecified foot limited to breakdown of skin: Secondary | ICD-10-CM

## 2019-07-18 DIAGNOSIS — L02612 Cutaneous abscess of left foot: Secondary | ICD-10-CM | POA: Diagnosis not present

## 2019-07-18 NOTE — Telephone Encounter (Signed)
-----   Message from Klaryssa Slade, DPM sent at 07/18/2019  9:55 AM EDT ----- Can you please order arterial studies given continued ulcer left hallux

## 2019-07-18 NOTE — Telephone Encounter (Signed)
Faxed orders to [facility]. 

## 2019-07-19 NOTE — Progress Notes (Signed)
Subjective: 79 year old female presents the office today for follow-up evaluation of a wound to the plantar left fifth toe.  She states that this callus to where she is not had any drainage or pus.  No increase in swelling or redness.  She denies any fevers, chills, nausea, vomiting.  No calf pain, chest pain, shortness of breath.     Objective: AAO x3, NAD DP/PT pulses palpable bilaterally, CRT less than 3 seconds On the left hallux hyperkeratotic lesion medial aspect On the right plantar hallux is a hyperkeratotic lesion.  Upon debridement there is superficial abscess noted and purulence was identified which is cultured.  Granular wound base was present with a depth approximate 0.4 cm there is no probing to bone, undermining or tunneling.  Minimal edema to the hallux there is no erythema or warmth there is no ascending cellulitis.  There is no fluctuation or crepitation any malodor. No open lesions or pre-ulcerative lesions.  No pain with calf compression, swelling, warmth, erythema  Assessment: Left hallux superficial small hallux ulceration  Plan: -All treatment options discussed with the patient including all alternatives, risks, complications.  -Debrided hyperkeratotic lesion to reveal the ulceration utilizing the 312 with scalpel.  This was cultured today.  X-rays were obtained reviewed.  No definitive evidence of osteomyelitis.  No soft tissue edema.  She is still on cefoxitin for lung infection by infectious disease.  We will await the wound culture before adding any further antibiotics. -Offloading shoe  Return in about 10 days (around 07/28/2019).  Omesha Slade DPM

## 2019-07-21 LAB — WOUND CULTURE
MICRO NUMBER:: 10463873
SPECIMEN QUALITY:: ADEQUATE

## 2019-07-24 ENCOUNTER — Telehealth: Payer: Self-pay | Admitting: Pharmacy Technician

## 2019-07-24 ENCOUNTER — Telehealth: Payer: Self-pay

## 2019-07-24 DIAGNOSIS — U071 COVID-19: Secondary | ICD-10-CM | POA: Diagnosis not present

## 2019-07-24 DIAGNOSIS — D649 Anemia, unspecified: Secondary | ICD-10-CM | POA: Diagnosis not present

## 2019-07-24 DIAGNOSIS — A310 Pulmonary mycobacterial infection: Secondary | ICD-10-CM | POA: Diagnosis not present

## 2019-07-24 DIAGNOSIS — J440 Chronic obstructive pulmonary disease with acute lower respiratory infection: Secondary | ICD-10-CM | POA: Diagnosis not present

## 2019-07-24 DIAGNOSIS — L89312 Pressure ulcer of right buttock, stage 2: Secondary | ICD-10-CM | POA: Diagnosis not present

## 2019-07-24 DIAGNOSIS — J9601 Acute respiratory failure with hypoxia: Secondary | ICD-10-CM | POA: Diagnosis not present

## 2019-07-24 DIAGNOSIS — T82898S Other specified complication of vascular prosthetic devices, implants and grafts, sequela: Secondary | ICD-10-CM

## 2019-07-24 MED FILL — LINEZOLID 600 MG TAB: 600 | 30 days supply | Qty: 30 | Fill #1

## 2019-07-24 NOTE — Telephone Encounter (Addendum)
[facility] Patient Advocate Encounter   Received notification from [facility] that prior authorization for Zyvox is required.   PA submitted on 07/24/2019 Key [id number] Status is pending, notification will be sent within 3 days [phone] [facility] customer service     [facility] will continue to follow and update patient and pharmacy once determination has been made.   Bartholomew Crews, CPhT Specialty Pharmacy Patient [facility] for Infectious Disease Phone: [phone] Fax: [phone] 07/24/2019 3:57 PM

## 2019-07-24 NOTE — Telephone Encounter (Signed)
[facility] called regarding patient. Olivia Hooper does not give cath flow in the home.   We will need to schedule the patient for cath flow through short stay.   Cath flow is not covered through patient's insurance.  She is aware.  Information documents on short stay orders.    PICC occlusion : T .82.898S  Vedha Tercero Mahlon Gammon, RN

## 2019-07-24 NOTE — Telephone Encounter (Signed)
Olivia Hooper with [facility] is calling regarding patients IV medications.  Patient told the nurse she was told by Dr Tommy Medal to take three prescribed medications together and if she did not have one she should stop them all.  She had issues with refilling  the Zyvox  and stopped the cefoxitin on Thursday. She did not flush PICC during this time.  PICC would not flush for nurse today and order was given for cath flow.     Per Dr Tommy Medal patient was instructed to take the three medications together but never told to stop the IV antibiotic if others were not available.   Advised [facility] home health pharmacist patient should re-start cefoxitin.   Laverle Patter, RN

## 2019-07-24 NOTE — Telephone Encounter (Addendum)
[facility] Patient Advocate Encounter  Prior Authorization for Zyvox has been approved.    PA# [id number] Effective dates: 07/24/2019 through 10/24/2019  Patients co-pay amount $17.98. She will have a family member pick up today at [facility] as she is out of this medication.  She said she has several Clofazamine remaining but can have a family member come by the clinic this week and pick up bottles for a refill when she runs out of her current medication. Will follow-up with [name] to see if medication needs to be ordered or already here.  Bartholomew Crews, CPhT Specialty Pharmacy Patient [facility] for Infectious Disease Phone: [phone] Fax: [phone] 07/24/2019 4:37 PM

## 2019-07-25 ENCOUNTER — Other Ambulatory Visit: Payer: Self-pay

## 2019-07-25 ENCOUNTER — Telehealth: Payer: Self-pay | Admitting: *Deleted

## 2019-07-25 ENCOUNTER — Ambulatory Visit (HOSPITAL_COMMUNITY)
Admission: RE | Admit: 2019-07-25 | Discharge: 2019-07-25 | Disposition: A | Discharge status: Home / Self Care | Payer: Medicare Other | Source: Ambulatory Visit | Attending: Infectious Disease | Admitting: Infectious Disease

## 2019-07-25 DIAGNOSIS — Z452 Encounter for adjustment and management of vascular access device: Secondary | ICD-10-CM | POA: Diagnosis not present

## 2019-07-25 MED ORDER — ALTEPLASE 2 MG IJ SOLR
INTRAMUSCULAR | Status: AC
  Filled 2019-07-25: qty 2

## 2019-07-25 MED ORDER — ALTEPLASE 2 MG IJ SOLR: 2.0000 mg | Freq: Once | INTRAMUSCULAR | Status: AC

## 2019-07-25 NOTE — Telephone Encounter (Signed)
Patient scheduled for cathflow at [facility] 5/18 at 12:00. Orders faxed. Patient aware. Landis Gandy, RN

## 2019-07-25 NOTE — Progress Notes (Addendum)
VAST consulted to administer Cath-Flo to right chest port. Upon assessing port for administration of alteplase, noted port was not accessed. Pt reported home health nurse dc'd needle once she was unable to get it to draw back blood. Pt reported that daughter administers antibiotic daily, which runs over 24 hrs. Daughter also changes battery and changes medication administration line. [facility] RN visits weekly to change needle and take blood sample. Accessed right chest port with 19g, 1 inch power needle. Upon initial drawback, obtained small amount of light brown fluid; flushed easily with 10 mL NS and good blood return obtained easily. Flushed port again with 72mL NS while awaiting callback from MD allowing HPN to remain in place for pt's medication to be delivered at home. Alteplase never administered as it was not needed; wasted dose in needlebox. Dr. Carlyle Basques returned call and stated it was fine for patient to leave facility with port access in place.

## 2019-07-26 ENCOUNTER — Telehealth: Payer: Self-pay

## 2019-07-26 NOTE — Telephone Encounter (Signed)
TC to pt per Olivia Rue NP to let her know that her recent CBC shows that her HGB is stable at 10.3. Patient also agreed to have lab and F/u visit in June. Sent over schedule message to get this appointment set up. Patient verbalized understanding of everything. No further problems or concerns at this time.

## 2019-07-26 NOTE — Progress Notes (Signed)
No, unfortunately cefoxitin doesn't cover enterobacter

## 2019-07-26 NOTE — Telephone Encounter (Signed)
Thanks [employer]. Her refill is here in the clinic.

## 2019-07-26 NOTE — Telephone Encounter (Signed)
I called and let the patient know this morning. Her daughter will come either today or tomorrow and pick up both oral medications for the patient.

## 2019-07-27 ENCOUNTER — Other Ambulatory Visit: Payer: Self-pay | Admitting: Podiatry

## 2019-07-27 ENCOUNTER — Telehealth: Payer: Self-pay | Admitting: *Deleted

## 2019-07-27 ENCOUNTER — Encounter: Payer: Self-pay | Admitting: Infectious Disease

## 2019-07-27 DIAGNOSIS — D649 Anemia, unspecified: Secondary | ICD-10-CM | POA: Diagnosis not present

## 2019-07-27 DIAGNOSIS — I10 Essential (primary) hypertension: Secondary | ICD-10-CM | POA: Diagnosis not present

## 2019-07-27 DIAGNOSIS — U071 COVID-19: Secondary | ICD-10-CM | POA: Diagnosis not present

## 2019-07-27 DIAGNOSIS — J440 Chronic obstructive pulmonary disease with acute lower respiratory infection: Secondary | ICD-10-CM | POA: Diagnosis not present

## 2019-07-27 DIAGNOSIS — J9601 Acute respiratory failure with hypoxia: Secondary | ICD-10-CM | POA: Diagnosis not present

## 2019-07-27 DIAGNOSIS — A310 Pulmonary mycobacterial infection: Secondary | ICD-10-CM | POA: Diagnosis not present

## 2019-07-27 DIAGNOSIS — L89312 Pressure ulcer of right buttock, stage 2: Secondary | ICD-10-CM | POA: Diagnosis not present

## 2019-07-27 MED ORDER — SULFAMETHOXAZOLE-TRIMETHOPRIM 800-160 MG PO TABS
1.0000 | ORAL_TABLET | Freq: Two times a day (BID) | ORAL | 0 refills | Status: DC
Start: 2019-07-27 — End: 2019-07-28

## 2019-07-27 NOTE — Telephone Encounter (Signed)
I informed pt of Dr. Leigh Aurora review of results and orders. Pt states her circulation test were scheduled 05/25/2021and is scheduled with Dr. Jacqualyn Posey the day before should she reschedule for after the testing. I told Dr. Jacqualyn Posey would want to see the wound and we could always call with results. Pt states understanding.

## 2019-07-27 NOTE — Telephone Encounter (Signed)
-----   Message from Temisha Slade, DPM sent at 07/27/2019 12:07 PM EDT ----- Val- let her know that the culture did grow bacteria. I spoke with Dr. Tommy Medal and I have sent in bactrim DS for her. Thanks.

## 2019-07-28 ENCOUNTER — Other Ambulatory Visit: Payer: Self-pay | Admitting: Podiatry

## 2019-07-28 MED ORDER — SULFAMETHOXAZOLE-TRIMETHOPRIM 800-160 MG PO TABS: 1.0000 | ORAL_TABLET | Freq: Two times a day (BID) | ORAL | 0 refills | Status: DC

## 2019-07-31 ENCOUNTER — Ambulatory Visit (INDEPENDENT_AMBULATORY_CARE_PROVIDER_SITE_OTHER): Payer: Medicare Other | Admitting: Podiatry

## 2019-07-31 ENCOUNTER — Encounter: Payer: Self-pay | Admitting: Infectious Disease

## 2019-07-31 ENCOUNTER — Encounter: Payer: Self-pay | Admitting: Podiatry

## 2019-07-31 ENCOUNTER — Other Ambulatory Visit: Payer: Self-pay

## 2019-07-31 DIAGNOSIS — U071 COVID-19: Secondary | ICD-10-CM | POA: Diagnosis not present

## 2019-07-31 DIAGNOSIS — J440 Chronic obstructive pulmonary disease with acute lower respiratory infection: Secondary | ICD-10-CM | POA: Diagnosis not present

## 2019-07-31 DIAGNOSIS — L02612 Cutaneous abscess of left foot: Secondary | ICD-10-CM | POA: Diagnosis not present

## 2019-07-31 DIAGNOSIS — D649 Anemia, unspecified: Secondary | ICD-10-CM | POA: Diagnosis not present

## 2019-07-31 DIAGNOSIS — L97521 Non-pressure chronic ulcer of other part of left foot limited to breakdown of skin: Secondary | ICD-10-CM | POA: Diagnosis not present

## 2019-07-31 DIAGNOSIS — J9601 Acute respiratory failure with hypoxia: Secondary | ICD-10-CM | POA: Diagnosis not present

## 2019-07-31 DIAGNOSIS — L89312 Pressure ulcer of right buttock, stage 2: Secondary | ICD-10-CM | POA: Diagnosis not present

## 2019-07-31 DIAGNOSIS — A310 Pulmonary mycobacterial infection: Secondary | ICD-10-CM | POA: Diagnosis not present

## 2019-08-01 ENCOUNTER — Ambulatory Visit (HOSPITAL_COMMUNITY)
Admission: RE | Admit: 2019-08-01 | Discharge: 2019-08-01 | Disposition: A | Discharge status: Home / Self Care | Payer: Medicare Other | Source: Ambulatory Visit | Attending: Cardiology | Admitting: Cardiology

## 2019-08-01 DIAGNOSIS — L97501 Non-pressure chronic ulcer of other part of unspecified foot limited to breakdown of skin: Secondary | ICD-10-CM | POA: Insufficient documentation

## 2019-08-01 DIAGNOSIS — L97521 Non-pressure chronic ulcer of other part of left foot limited to breakdown of skin: Secondary | ICD-10-CM | POA: Diagnosis not present

## 2019-08-01 DIAGNOSIS — A310 Pulmonary mycobacterial infection: Secondary | ICD-10-CM | POA: Diagnosis not present

## 2019-08-01 NOTE — Progress Notes (Signed)
Subjective: 79 year old female presents the office today for follow-up evaluation of a wound to the plantar left fifth toe.  She states that she is doing much better.  She has been keeping mupirocin ointment on the wound daily and she did start the Bactrim.  She took this for couple days without any problems however this morning she states that it made her sick and she is not sure if is the antibiotic or not.  She had nausea and vomiting after taking antibiotic today. She denies any fevers, chills, nausea, vomiting.  No calf pain, chest pain, shortness of breath.     Objective: AAO x3, NAD DP/PT pulses palpable bilaterally, CRT less than 3 seconds On the plantar aspect the left hallux is a hyperkeratotic lesion upon debridement there is some dried blood appears that the wound is healed also completely.  Only very small superficial area still open.  There is no edema, erythema, ascending cellulitis.  No fluctuation crepitation.  There is no malodor. No open lesions or pre-ulcerative lesions.  No pain with calf compression, swelling, warmth, erythema  Assessment: Healing ulceration left foot  Plan: -All treatment options discussed with the patient including all alternatives, risks, complications.  -Debrided hyperkeratotic tissue to any complications.  The wound is almost healed.  She is going to try to take antibiotics again tonight however if she has any side effects to stop the medication with me know.  On her continue with the surgical shoe with [employer] for offloading.   Return in about 2 weeks (around 08/14/2019).  Anmol Slade DPM

## 2019-08-02 ENCOUNTER — Telehealth: Payer: Self-pay | Admitting: Infectious Disease

## 2019-08-02 NOTE — Telephone Encounter (Signed)
Well hopefully we at least have consistent levels on cefoxitin

## 2019-08-02 NOTE — Telephone Encounter (Signed)
I have asked her and her daughter to stop the infusion 2 hours before lab draws but from her history.Marland Kitchen it is likely she is not doing that.

## 2019-08-02 NOTE — Telephone Encounter (Signed)
I am seeing labs on Olivia Hooper from 24 May that show her creatinine is risen to 1.21 from 1.07 from 0.9 on 10 May  They timing the BMP collections correctly because I know cefoxitin can interfere with serum creatinine measurements?  I wonder if she is on anything that could be dehydrating her and causing a bit of renal insufficiency  Her hemoglobin is stable at 10.1

## 2019-08-03 NOTE — Telephone Encounter (Signed)
GOod pick up. I bet should be stable through next week

## 2019-08-03 NOTE — Telephone Encounter (Signed)
Ahh I think I know what happened... she is on Bactrim from Dr. Jacqualyn Posey for 7 days. Maybe that it why it is elevated recently.

## 2019-08-04 ENCOUNTER — Telehealth: Payer: Self-pay | Admitting: *Deleted

## 2019-08-04 NOTE — Telephone Encounter (Signed)
Unable to leave message on mobile phone wireless customer in not available. I informed pt of Dr. Leigh Aurora review of results and recommendations.

## 2019-08-04 NOTE — Telephone Encounter (Signed)
-----   Message from Daylen Slade, DPM sent at 08/01/2019  3:47 PM EDT ----- Val- please let her know that the circulation test was normal except mild decrease to the big toe. Wound is healing- will monitor

## 2019-08-08 ENCOUNTER — Encounter: Payer: Self-pay | Admitting: Infectious Disease

## 2019-08-08 DIAGNOSIS — A310 Pulmonary mycobacterial infection: Secondary | ICD-10-CM | POA: Diagnosis not present

## 2019-08-09 ENCOUNTER — Other Ambulatory Visit: Payer: Self-pay | Admitting: *Deleted

## 2019-08-09 NOTE — Patient Outreach (Addendum)
[facility]) Care Management  08/09/2019  Alisabeth Selkirk [birth year] [id number]   Mrs. Dowson has had an uneventful month of May. She did get to go up to [facility] to be with the family and she enjoyed that very much. Her son would like her to come up every weekend but she says she cannot tolerate that much traveling. She says he likes to try to "be in charge of her"  She will see the Dr., Dr. Tommy Medal is referring her to on June 14th.  She reports her weight is holding at 87! This is a 4# wt loss!  She reports that she still does not have her PROAIR RENEWAL AND HAS NOT HEARD FROM [facility] REGARDING SUGGESTIONS FOR PROSTAT AND MIRTAZPINE 7.5 MG FOR (depression, appetite stimulation and sleep benefits.). I WILL SEND MY NOTE AGAIN AND PT WILL CALL TO ASK ABOUT THESE SUGGESTIONS.  We discussed ways for her to talk with her adult children so that they are heart for reaffirming that she wants her wishes to be followed. Reinforced they want her to live and be happy. She can set some boundaries with [place] and when [place] will be few family members there.  [facility] CM Care Plan Problem One     Most Recent Value  Care Plan Problem One  Complicated co-morbidities (symptomatic anemia possible related to medication for lung infection on COVID infection, respiratory failure, COPD  (Pended)   Role Documenting the Problem One  Care Management Coordinator  (Pended)   Care Plan for Problem One  Not Active  (Pended)   [facility]N Long Term Goal   Pt will follow medical regimen including attending appts over the next 60 days by pt report and chart review..  (Pended)   THN Long Term Goal Start Date  05/31/19  (Pended)   [facility]N Long Term Goal Met Date  07/12/19  (Pended)  [Met]  [facility] CM Short Term Goal #1   Pt to call NP for any problems to avoid complications over the next 30 days.  (Pended)   [facility]N CM Short Term Goal #1 Start Date  05/31/19  (Pended)   THN CM  Short Term Goal #1 Met Date  07/12/19  (Pended)     [facility]N CM Care Plan Problem Two     Most Recent Value  Care Plan Problem Two  Medication adherance.  (Pended)   Role Documenting the Problem Two  Care Management Coordinator  (Pended)   Care Plan for Problem Two  Not Active  (Pended)   [facility]N CM Short Term Goal #1   Pt will obtain meds she does not have this week. (Symbicort, additional torsemide, ?dexamethazone.)  (Pended)   [facility]N CM Short Term Goal #1 Start Date  05/31/19  (Pended)   [facility]N CM Short Term Goal #1 Met Date   06/14/19  (Pended)     [facility]N CM Care Plan Problem Three     Most Recent Value  Care Plan Problem Three  Low weight  (Pended)   Role Documenting the Problem Three  Care Management Coordinator  (Pended)   Care Plan for Problem Three  Active  (Pended)   THN Long Term Goal   Pt wt will be stable at 91 (no less than 90 and no greater than 96 in one month, avoiding fluid overload.  (Pended)   THN Long Term Goal Start Date  06/14/19  (Pended)      I  will call her again in one month.  [name]. [name], MSN, GNP-BC Gerontological Nurse Practitioner [facility] Care Management [phone]  Addendum: Practice did verify that my note from 07/12/19 was received on that day and that my note from today has been received. Will ask MD on call to address or put as a priority for Mr. Delilah Shan upon his return. CCS

## 2019-08-14 ENCOUNTER — Ambulatory Visit: Payer: Medicare Other | Admitting: Infectious Disease

## 2019-08-14 DIAGNOSIS — J449 Chronic obstructive pulmonary disease, unspecified: Secondary | ICD-10-CM | POA: Diagnosis not present

## 2019-08-14 DIAGNOSIS — A310 Pulmonary mycobacterial infection: Secondary | ICD-10-CM | POA: Diagnosis not present

## 2019-08-15 DIAGNOSIS — A310 Pulmonary mycobacterial infection: Secondary | ICD-10-CM | POA: Diagnosis not present

## 2019-08-18 ENCOUNTER — Telehealth: Payer: Self-pay

## 2019-08-18 NOTE — Telephone Encounter (Signed)
Jeani Hawking, Pharmacist called office today with lab results. RBC: 2.89 Will fax additional labs to office. [place]

## 2019-08-21 ENCOUNTER — Ambulatory Visit (INDEPENDENT_AMBULATORY_CARE_PROVIDER_SITE_OTHER): Payer: Medicare Other | Admitting: Infectious Disease

## 2019-08-21 ENCOUNTER — Encounter: Payer: Self-pay | Admitting: Infectious Disease

## 2019-08-21 ENCOUNTER — Other Ambulatory Visit: Payer: Self-pay

## 2019-08-21 VITALS — BP 156/75 | HR 90 | Temp 98.1°F | Wt 84.2 lb

## 2019-08-21 DIAGNOSIS — F17210 Nicotine dependence, cigarettes, uncomplicated: Secondary | ICD-10-CM | POA: Diagnosis not present

## 2019-08-21 DIAGNOSIS — J449 Chronic obstructive pulmonary disease, unspecified: Secondary | ICD-10-CM | POA: Diagnosis not present

## 2019-08-21 DIAGNOSIS — A310 Pulmonary mycobacterial infection: Secondary | ICD-10-CM

## 2019-08-21 NOTE — Progress Notes (Signed)
Subjective:   Chief complaint: Feeling very lousy and does not feel like living at times due to nausea and malaise   Patient ID: Olivia Hooper, female    DOB: 1940-12-31, 79 y.o.   MRN: [id number]  HPI  79 year-old Caucasian lady with history of tobacco use chronic obstructive pulmonary disease severe rheumatoid arthritis, left breast cancer who has been on immunosuppressive therapy. She apparently had a 1 month history of hemoptysis. X-ray had shown a right upper lobe lung mass CT had shown 6.5 x 5.2 x 7.47 manner cavitary mass in the right upper lobe. There are multiple small lung nodules as well. There is also a small anterior mediastinal mass.  Patient underwent bronchoscopy with Dr. Tamala Julian with cytology is AFB fungal stains and cultures. 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 In the interval she had a PET CT scan that showed the cavitary mass which was very hypermetabolic patchy tree-in-bud opacities in both lungs there were hypermetabolic and a 3.9 1.6 cm anterior mediastinal soft tissue mass that was hypermetabolic.  There was anxiety about patient potentially having a thymoma but at present she was felt to be a high risk surgical patient when seen by Dr. Salomon Fick with cardiothoracic surgery.  I also agree with him that this mediastinal mass could be due to the mycobacterial abscessus infection.  These organisms are not notoriously difficult to treat still to have some anxiety the patient might need some surgical intervention to gain control of her chest pathology.  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  She hadlost 10 pounds of weight over the last half year.  She has stopped smoking now.  We crafted a regimen to treat her Mycobacterium abscessus with once daily clofazimine twice daily Zyvox and IV cefoxitin via  continuous infusion. When I saw her in late February she was5 weeks into treatment. She has been feeling progressively more fatigued and sleeping excessively according to both her and her son who accompanied her.  I noticed that she had dropped her hemoglobin down into the range of 7.4 after being 10. I was concerned that Zyvox might have been causing some myelosuppression though her platelets had not dropped and we dropped her Zyvox dose to once daily. In the interim she experienced worsening fatigue and anemia and was admitted hospital. Her hemoglobin had dropped below 7 into the 6 range. She was found to have COVID-19 and was treated with steroids and remdesivir her antibiotics were stopped altogether. She was seen by my partner Dr. Linus Salmons and and he an alternate regimen was considered including omadacycline.  Unfortunately even omadacycline was approved her cost for drugs was still 7000 due to the nature of her insurance.  Rechallenge her with low-dose Zyvox at 600 mg a day along with continued clofazimine and IV cefoxitin.  She has felt pretty poorly on this regimen and stop the meds altogether.  She wanted to get a second opinion at [facility] with Dr. Brigitte Pulse which she has now done.  In the interval she had resumed all medications.  Dr. Lorenda Cahill had recommended that she stop all medications for at least 4 days and I think she could even stop for longer.  He then recommends reintroducing these medications stepwise with introducing clofazimine first for 4 days followed by linezolid 600 mg daily for 4 days then followed by IV cefoxitin.  If 1 or more of  these medications is difficult for her to tolerate he suggested looking at some other alternative regimens if we can get them covered by her insurance including inhaled amikacin and bedaliquine along with having M abscessus sent from lab for bedaliquine sensitivities.  She is in agreement with this plan.  She understands that her  infection is not curable and this is really a balance between her disease progressing and making her quality life worse with the antibiotics hoping to provide some amelioration of this versus the toxicity of the medications and her difficulty to tolerate them  He also developed an lesion on her toe which was cultured by podiatry and grew a Enterobacter cloacae I species that is been treated with Bactrim.  Did not want me to look at the wound on her foot today.    Review of Systems  Constitutional: Positive for activity change, appetite change and fatigue. Negative for chills, diaphoresis, fever and unexpected weight change.  HENT: Negative for congestion, rhinorrhea, sinus pressure, sneezing, sore throat and trouble swallowing.   Eyes: Negative for photophobia and visual disturbance.  Respiratory: Negative for cough, chest tightness, shortness of breath, wheezing and stridor.   Cardiovascular: Negative for chest pain, palpitations and leg swelling.  Gastrointestinal: Positive for nausea. Negative for abdominal distention, abdominal pain, anal bleeding, blood in stool, constipation, diarrhea and vomiting.  Genitourinary: Negative for difficulty urinating, dysuria, flank pain and hematuria.  Musculoskeletal: Negative for arthralgias, back pain, gait problem, joint swelling and myalgias.  Skin: Positive for wound. Negative for color change, pallor and rash.  Neurological: Negative for dizziness, tremors, weakness and light-headedness.  Hematological: Negative for adenopathy. Does not bruise/bleed easily.  Psychiatric/Behavioral: Positive for dysphoric mood. Negative for agitation, behavioral problems, confusion, decreased concentration and sleep disturbance.       Objective:   Physical Exam Constitutional:      General: She is not in acute distress.    Appearance: Normal appearance. She is well-developed and underweight. She is not ill-appearing or diaphoretic.  HENT:     Head:  Normocephalic and atraumatic.     Right Ear: Hearing and external ear normal.     Left Ear: Hearing and external ear normal.     Nose: No nasal deformity or rhinorrhea.  Eyes:     General: No scleral icterus.    Conjunctiva/sclera: Conjunctivae normal.     Right eye: Right conjunctiva is not injected.     Left eye: Left conjunctiva is not injected.  Neck:     Vascular: No JVD.  Cardiovascular:     Rate and Rhythm: Normal rate and regular rhythm.     Heart sounds: S1 normal and S2 normal. No friction rub.  Pulmonary:     Effort: Pulmonary effort is normal. No respiratory distress.  Abdominal:     General: There is no distension.     Palpations: Abdomen is soft.  Musculoskeletal:        General: Normal range of motion.     Right shoulder: Normal.     Left shoulder: Normal.     Cervical back: Normal range of motion and neck supple.     Right hip: Normal.     Left hip: Normal.     Right knee: Normal.     Left knee: Normal.  Lymphadenopathy:     Head:     Right side of head: No submandibular, preauricular or posterior auricular adenopathy.     Left side of head: No submandibular, preauricular or posterior auricular adenopathy.  Cervical: No cervical adenopathy.     Right cervical: No superficial or deep cervical adenopathy.    Left cervical: No superficial or deep cervical adenopathy.  Skin:    General: Skin is warm and dry.     Coloration: Skin is not pale.     Findings: No abrasion, bruising, ecchymosis, erythema, lesion or rash.     Nails: There is no clubbing.  Neurological:     General: No focal deficit present.     Mental Status: She is alert and oriented to person, place, and time.     Sensory: No sensory deficit.     Coordination: Coordination normal.     Gait: Gait normal.  Psychiatric:        Attention and Perception: She is attentive.        Speech: Speech normal.        Behavior: Behavior normal. Behavior is cooperative.        Thought Content: Thought  content normal.        Judgment: Judgment normal.           Assessment & Plan:  Mycocterium abscessus lung infection:  Endeavor to test for bedaquiline sensitivity  We will hold antimicrobial therapy for at least 4 days and until she is feeling better.  Then will stepwise reintroduce clofazimine for 4 days followed by Zyvox 600 mg for 4 days followed by cefoxitin once we run into toxicity which I am sure we will hope we can pinpoint the specific antibiotic and then come up with a plan for alternative antibiotic such as inhaled amikacin or bedaquiline   Smoking: needs to stop smoking   We spent greater than 30  minutes with the patient including greater than 50% of time in face to face counsel of the patient and her son re nature of this infection and in coordination of her  care.    As above we will see if the lab can send off the isolate for susceptibility to

## 2019-08-21 NOTE — Patient Instructions (Addendum)
A) Hold all antibiotics temporarily for about 3-4 days   Then restart  Clofazimine Wait four days then  Restart the linezolid 600 mg daily  Wait four days then  Restart the IV cefoxitin  Make appt in 2 weeks with ID pharmacy

## 2019-08-21 NOTE — Progress Notes (Signed)
I was asked my Dr. Tommy Medal to speak with Dilpreet today about her antibiotic plan to treat her pulmonary M. abscessus infection. She is having some trouble tolerating her current regimen of clofazimine, linezolid and cefoxitin. She reports feeling fatigued, irritable and GI intolerance. The plan will be to have her stop all her medications for 4-7 days until she feels better then we will slowly restart her medications one by one. She will restart the clofazimine first, take for 4 days then restart the linezolid, take both for 4 days then restart the cefoxitin. I told her to watch how shes feeling closely during this because the goal will be to determine which medication is causing her to feel poorly. Once we figure out which medication she is having trouble tolerating we will work to find an alternative. Some options could be inhaled amikacin, bedaquiline, or reduced dose linezolid. She will follow up with pharmacy on 6/30 to see how she is doing and work on a plan for alternative therapies.

## 2019-08-24 ENCOUNTER — Other Ambulatory Visit: Payer: Self-pay | Admitting: Thoracic Surgery (Cardiothoracic Vascular Surgery)

## 2019-08-24 DIAGNOSIS — R918 Other nonspecific abnormal finding of lung field: Secondary | ICD-10-CM

## 2019-08-28 ENCOUNTER — Telehealth: Payer: Self-pay

## 2019-08-28 NOTE — Telephone Encounter (Signed)
I am ok with this approach

## 2019-08-28 NOTE — Telephone Encounter (Signed)
Received call from [place] with [facility] states patient spoke with [facility] team ( Dr. Tommy Medal and Pharmacy) and the plan was to hold antibiotics and restarting all antibiotics slowly starting with Clofazimine then linezolid, the cefoxitin.  [place] reports patient restarted antibiotics this Saturday initing with linezoliod and plans to wait 7 days then restart clofazimine, then delaying  to restart IV cefoxitin until 09/11/19 due to [place] weekend. Routing to provider and pharmacy to made aware. Eugenia Mcalpine

## 2019-08-28 NOTE — Telephone Encounter (Signed)
Noted. Heath RN made aware.

## 2019-08-31 DIAGNOSIS — Z452 Encounter for adjustment and management of vascular access device: Secondary | ICD-10-CM | POA: Diagnosis not present

## 2019-08-31 DIAGNOSIS — I700 Atherosclerosis of aorta: Secondary | ICD-10-CM | POA: Diagnosis not present

## 2019-08-31 DIAGNOSIS — A310 Pulmonary mycobacterial infection: Secondary | ICD-10-CM | POA: Diagnosis not present

## 2019-08-31 DIAGNOSIS — J9601 Acute respiratory failure with hypoxia: Secondary | ICD-10-CM | POA: Diagnosis not present

## 2019-08-31 DIAGNOSIS — Z853 Personal history of malignant neoplasm of breast: Secondary | ICD-10-CM | POA: Diagnosis not present

## 2019-08-31 DIAGNOSIS — E785 Hyperlipidemia, unspecified: Secondary | ICD-10-CM | POA: Diagnosis not present

## 2019-08-31 DIAGNOSIS — D649 Anemia, unspecified: Secondary | ICD-10-CM | POA: Diagnosis not present

## 2019-08-31 DIAGNOSIS — N179 Acute kidney failure, unspecified: Secondary | ICD-10-CM | POA: Diagnosis not present

## 2019-08-31 DIAGNOSIS — Z9181 History of falling: Secondary | ICD-10-CM | POA: Diagnosis not present

## 2019-08-31 DIAGNOSIS — E213 Hyperparathyroidism, unspecified: Secondary | ICD-10-CM | POA: Diagnosis not present

## 2019-08-31 DIAGNOSIS — U071 COVID-19: Secondary | ICD-10-CM | POA: Diagnosis not present

## 2019-08-31 DIAGNOSIS — R918 Other nonspecific abnormal finding of lung field: Secondary | ICD-10-CM | POA: Diagnosis not present

## 2019-08-31 DIAGNOSIS — I517 Cardiomegaly: Secondary | ICD-10-CM | POA: Diagnosis not present

## 2019-08-31 DIAGNOSIS — Z9012 Acquired absence of left breast and nipple: Secondary | ICD-10-CM | POA: Diagnosis not present

## 2019-08-31 DIAGNOSIS — Z792 Long term (current) use of antibiotics: Secondary | ICD-10-CM | POA: Diagnosis not present

## 2019-08-31 DIAGNOSIS — J440 Chronic obstructive pulmonary disease with acute lower respiratory infection: Secondary | ICD-10-CM | POA: Diagnosis not present

## 2019-08-31 DIAGNOSIS — I051 Rheumatic mitral insufficiency: Secondary | ICD-10-CM | POA: Diagnosis not present

## 2019-08-31 DIAGNOSIS — Z87891 Personal history of nicotine dependence: Secondary | ICD-10-CM | POA: Diagnosis not present

## 2019-08-31 DIAGNOSIS — E43 Unspecified severe protein-calorie malnutrition: Secondary | ICD-10-CM | POA: Diagnosis not present

## 2019-08-31 DIAGNOSIS — H353130 Nonexudative age-related macular degeneration, bilateral, stage unspecified: Secondary | ICD-10-CM | POA: Diagnosis not present

## 2019-08-31 DIAGNOSIS — Z7952 Long term (current) use of systemic steroids: Secondary | ICD-10-CM | POA: Diagnosis not present

## 2019-08-31 DIAGNOSIS — E559 Vitamin D deficiency, unspecified: Secondary | ICD-10-CM | POA: Diagnosis not present

## 2019-08-31 DIAGNOSIS — M069 Rheumatoid arthritis, unspecified: Secondary | ICD-10-CM | POA: Diagnosis not present

## 2019-08-31 DIAGNOSIS — M810 Age-related osteoporosis without current pathological fracture: Secondary | ICD-10-CM | POA: Diagnosis not present

## 2019-08-31 DIAGNOSIS — Z9981 Dependence on supplemental oxygen: Secondary | ICD-10-CM | POA: Diagnosis not present

## 2019-09-04 ENCOUNTER — Ambulatory Visit (INDEPENDENT_AMBULATORY_CARE_PROVIDER_SITE_OTHER): Payer: Medicare Other | Admitting: Podiatry

## 2019-09-04 ENCOUNTER — Other Ambulatory Visit: Payer: Self-pay

## 2019-09-04 DIAGNOSIS — L97521 Non-pressure chronic ulcer of other part of left foot limited to breakdown of skin: Secondary | ICD-10-CM | POA: Diagnosis not present

## 2019-09-04 DIAGNOSIS — J440 Chronic obstructive pulmonary disease with acute lower respiratory infection: Secondary | ICD-10-CM | POA: Diagnosis not present

## 2019-09-04 DIAGNOSIS — J9601 Acute respiratory failure with hypoxia: Secondary | ICD-10-CM | POA: Diagnosis not present

## 2019-09-04 DIAGNOSIS — A310 Pulmonary mycobacterial infection: Secondary | ICD-10-CM | POA: Diagnosis not present

## 2019-09-04 DIAGNOSIS — D649 Anemia, unspecified: Secondary | ICD-10-CM | POA: Diagnosis not present

## 2019-09-04 DIAGNOSIS — N179 Acute kidney failure, unspecified: Secondary | ICD-10-CM | POA: Diagnosis not present

## 2019-09-04 DIAGNOSIS — U071 COVID-19: Secondary | ICD-10-CM | POA: Diagnosis not present

## 2019-09-06 ENCOUNTER — Ambulatory Visit (INDEPENDENT_AMBULATORY_CARE_PROVIDER_SITE_OTHER): Payer: Medicare Other | Admitting: Pharmacist

## 2019-09-06 ENCOUNTER — Other Ambulatory Visit: Payer: Self-pay

## 2019-09-06 DIAGNOSIS — R197 Diarrhea, unspecified: Secondary | ICD-10-CM

## 2019-09-06 DIAGNOSIS — A310 Pulmonary mycobacterial infection: Secondary | ICD-10-CM

## 2019-09-06 DIAGNOSIS — J984 Other disorders of lung: Secondary | ICD-10-CM | POA: Diagnosis not present

## 2019-09-06 MED ORDER — DIPHENOXYLATE-ATROPINE 2.5-0.025 MG/5ML PO LIQD: 5.0000 mL | Freq: Four times a day (QID) | ORAL | 0 refills | Status: DC | PRN

## 2019-09-06 NOTE — Progress Notes (Signed)
HPI: Olivia Hooper is a 79 y.o. female who presents to the [facility] clinic for follow up for her mycobacterium abscessus pulmonary infection.  Patient Active Problem List   Diagnosis Date Noted  . Pressure injury of skin 05/19/2019  . COVID-19 virus infection 05/18/2019  . Symptomatic anemia 05/17/2019  . AKI (acute kidney injury) ([place]) 05/17/2019  . Cardiomegaly 05/17/2019  . Anemia 05/03/2019  . Drug-induced anemia 05/03/2019  . Sleeping excessive 05/03/2019  . Pulmonary mycobacterial infection ([place]) 03/14/2019  . Cavitating mass in right upper lung lobe 01/31/2019  . S/P eye surgery 01/25/2015  . 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 Age-related macular degeneration, dry, both eyes 11/30/2014  . Pseudophakia of both eyes 11/30/2014  . Pulmonary nodules 03/18/2014  . Cigarette smoker 03/18/2014  . COPD GOLD II / still smoking  03/15/2014  . Profound fatigue 02/18/2011  . Encounter for long-term (current) use of other medications 01/08/2011  . Rheumatoid arthritis ([place]) 08/21/2009    Patient's Medications  New Prescriptions   DIPHENOXYLATE-ATROPINE (LOMOTIL) 2.5-0.025 MG/5ML LIQUID    Take 5 mLs by mouth 4 (four) times daily as needed for diarrhea or loose stools.  Previous Medications   ACETAMINOPHEN (TYLENOL) 500 MG TABLET    Take 500 mg by mouth every 8 (eight) hours as needed for mild pain.    ALBUTEROL (VENTOLIN HFA) 108 (90 BASE) MCG/ACT INHALER    Inhale 2 puffs into the lungs every 6 (six) hours as needed for wheezing or shortness of breath.    AMBULATORY NON FORMULARY MEDICATION    Take 100 mg by mouth daily. Medication Name: clofazimine for mycobacterium abscessus pulmonary infection   ASCORBIC ACID (VITAMIN C) 500 MG TABLET    Take 1 tablet (500 mg total) by mouth 2 (two) times daily.   CEFOXITIN (MEFOXIN) 2 G INJECTION       CEFOXITIN 8 G IN DEXTROSE 5 % 50 ML    Inject 8 g into the  vein continuous.   FLUCONAZOLE (DIFLUCAN) 100 MG TABLET    Take 2 tablets (200 mg total) by mouth daily.   GUAIFENESIN-DEXTROMETHORPHAN (ROBITUSSIN DM) 100-10 MG/5ML SYRUP    Take 5 mLs by mouth every 4 (four) hours as needed for cough.   IPRATROPIUM-ALBUTEROL (COMBIVENT) 20-100 MCG/ACT AERS RESPIMAT    Inhale 2 puffs into the lungs every 6 (six) hours.   LIDOCAINE-PRILOCAINE (EMLA) CREAM    Apply 1 application topically as needed.   LINEZOLID (ZYVOX) 600 MG TABLET    Take 1 tablet (600 mg total) by mouth daily.   MOMETASONE-FORMOTEROL (DULERA) 100-5 MCG/ACT AERO    Inhale 2 puffs into the lungs 2 (two) times daily as needed for wheezing or shortness of breath.    MUPIROCIN OINTMENT (BACTROBAN) 2 %    Apply 1 application topically 2 (two) times daily.   POLYETHYL GLYCOL-PROPYL GLYCOL (SYSTANE OP)    Place 1 drop into both eyes daily as needed (dry eyes).   SULFAMETHOXAZOLE-TRIMETHOPRIM (BACTRIM DS) 800-160 MG TABLET    Take 1 tablet by mouth 2 (two) times daily.   TORSEMIDE (DEMADEX) 5 MG TABLET    Take 5 mg by mouth daily.   TRAMADOL (ULTRAM) 50 MG TABLET    Take 50 mg by mouth 2 (two) times daily as needed for moderate pain.    ZINC SULFATE 220 (50 ZN) MG CAPSULE    Take 1 capsule (220 mg total) by mouth daily.  Modified  Medications   No medications on file  Discontinued Medications   No medications on file    Allergies: Allergies  Allergen Reactions  . Buprenorphine Hcl Nausea And Vomiting  . Morphine And Related Nausea And Vomiting         Past Medical History: Past Medical History:  Diagnosis Date  . Anemia 05/03/2019  . Breast cancer ([place])    on left  . Carpal tunnel syndrome   . COPD (chronic obstructive pulmonary disease) ([place])   . Drug-induced anemia 05/03/2019  . Dyspnea   . Hypercalcemia   . Hyperlipidemia   . Hyperparathyroidism ([place])   . Osteoporosis   . Pneumonia   . Pulmonary mycobacterial infection ([place]) 03/14/2019  . RA (rheumatoid arthritis) ([place])   .  Sleeping excessive 05/03/2019  . Vertigo   . Vitamin D deficiency     Social History: Social History   Socioeconomic History  . Marital status: Widowed    Spouse name: Not on file  . Number of children: 4  . Years of education: 12+  . Highest education level: Associate degree: occupational, Hotel manager, or vocational program  Occupational History  . Occupation: Retired   Tobacco Use  . Smoking status: Light Tobacco Smoker    Packs/day: 0.25    Years: 62.00    Pack years: 15.50    Types: Cigarettes    Last attempt to quit: 01/12/2019    Years since quitting: 0.6  . Smokeless tobacco: Never Used  Vaping Use  . Vaping Use: Never used  Substance and Sexual Activity  . Alcohol use: No    Alcohol/week: 0.0 standard drinks  . Drug use: No  . Sexual activity: Not on file  Other Topics Concern  . Not on file  Social History Narrative   Lives alone, 4 children, independent.   Social Determinants of Health   Financial Resource Strain: Low Risk   . Difficulty of Paying Living Expenses: Not hard at all  Food Insecurity: No Food Insecurity  . Worried About Charity fundraiser in the Last Year: Never true  . Ran Out of Food in the Last Year: Never true  Transportation Needs: No Transportation Needs  . Lack of Transportation (Medical): No  . Lack of Transportation (Non-Medical): No  Physical Activity: Inactive  . Days of Exercise per Week: 0 days  . Minutes of Exercise per Session: 0 min  Stress: No Stress Concern Present  . Feeling of Stress : Only a little  Social Connections: Moderately Integrated  . Frequency of Communication with Friends and Family: More than three times a week  . Frequency of Social Gatherings with Friends and Family: More than three times a week  . Attends Religious Services: More than 4 times per year  . Active Member of Clubs or Organizations: Yes  . Attends Archivist Meetings: More than 4 times per year  . Marital Status: Widowed     Labs: No results found for: HIV1RNAQUANT, HIV1RNAVL, CD4TABS   Assessment: Olivia Hooper is here today to follow up for her pulmonary mycobacterium abscessus infection.  She was having trouble tolerating linezolid, clofazimine,and IV cefoxitin and stopped all antibiotics. She saw Dr. Lorenda Cahill at [facility] and he recommended to slowly start the antibiotics back one by one to see which one was causing the most issues and to see if she could tolerate them better.   She started taking linezolid 600 mg once daily on 6/19 and tolerated it very well with no side effects noted. She  started taking clofazimine on 6/27 and has had a few episodes of diarrhea and two episodes of vomiting. She states that it was just liquid when she vomited.  She has been taking imodium for the diarrhea and it has helped. She is asking for Lomotil as a nurse told her that she may benefit from that.  I asked Dr. Tommy Medal and he is fine with prescribing that. I will print off a prescription for her to take to [facility].  She will continue taking both linezolid and clofazimine for the next week and start IV cefoxitin at the end of next week. This way we can make sure she is tolerating the clofazimine ok. I will have her come back and see me towards the middle of July about a week or so after being on all three medications again to see how she is doing. We discussed the other options including amikacin and bedaquiline. I told her that these three antibiotics were actually the least toxic.  She will call with any questions in the meantime.   Plan: - Continue linezolid 600 mg PO daily - Continue clofazimine 100 mg (2 capsules) PO once daily - Start IV cefoxitin towards the end of next week - Lomotil PRN for diarrhea - F/u with me again 7/20 at 915am - F/u with Dr. Tommy Medal 8/16 at 1115am  Monico Sudduth L. Pearse Shiffler, PharmD, BCIDP, AAHIVP, CPP Clinical Pharmacist Practitioner Infectious Diseases [facility] 09/06/2019, 3:35 PM

## 2019-09-06 NOTE — Progress Notes (Signed)
Subjective: 79 year old female presents the office today for follow-up evaluation of a wound to the left foot.  She states that she has been doing well and she states has been feeling better.  Denies any swelling or redness or any drainage. She denies any fevers, chills, nausea, vomiting.  No calf pain, chest pain, shortness of breath.     Objective: AAO x3, NAD DP/PT pulses palpable bilaterally, CRT less than 3 seconds On the plantar aspect the left hallux is a hyperkeratotic lesion upon debridement there is some dried blood however upon read there is still a small superficial opening identified there is no probing, undermining or tunneling.  No surrounding erythema, ascending cellulitis.  There is no fluctuation crepitation.  No open lesions or pre-ulcerative lesions.  No pain with calf compression, swelling, warmth, erythema  Assessment: Healing ulceration left foot  Plan: -All treatment options discussed with the patient including all alternatives, risks, complications.  -Debrided hyperkeratotic tissue to any complications.  Still superficial opening.  Recommend he continue with antibiotic ointment dressing changes daily.  Offloading.  Monitoring signs or symptoms of infection.  Continue with surgical shoe with offloading/Pegassist   Return in about 2 weeks (around 09/18/2019).  Binnie Slade DPM

## 2019-09-13 ENCOUNTER — Other Ambulatory Visit: Payer: Self-pay | Admitting: *Deleted

## 2019-09-13 NOTE — Patient Outreach (Signed)
[facility]) Care Management  09/13/2019  Shakeela Rabadan 28-Apr-1940 [id number]  Telephone outreach:  Stopped antibiotics and infusion in June. She had a med vacation for 7 days and then resumed one of the antibiotic for 7 days, the second antibiotic was started and she continues both of these and is expected to restart the infusion soon.  Reporting massive hair loss, clumps of hair coming out. She is distressed over this. This has esculated since the antibiotics were resumed.  GI, only occasional diarrhea, controlled with imodium and she has bowel control.   Wt today is 82#. Good appetite and eating well. She has lost an additional 5# since we talked in June for a total of 10# in the last 3 months!  Today, she denies any sxs of depression or insomnia.  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 Messaged [name] Kuppelweiser, Pharm D in [place] to alert and requested labs (calcium, vitamin D and any she feels may be helpful.  I will call Mrs. Yazdi again in one month. She has been encouraged to call me if she has any issues before then.  [name]. [name], MSN, [facility] Gerontological Nurse Practitioner [facility] Care Management [phone]

## 2019-09-14 ENCOUNTER — Telehealth: Payer: Self-pay

## 2019-09-14 ENCOUNTER — Ambulatory Visit
Admission: RE | Admit: 2019-09-14 | Discharge: 2019-09-14 | Disposition: A | Discharge status: Home / Self Care | Payer: Medicare Other | Source: Ambulatory Visit | Attending: Thoracic Surgery (Cardiothoracic Vascular Surgery) | Admitting: Thoracic Surgery (Cardiothoracic Vascular Surgery)

## 2019-09-14 DIAGNOSIS — R918 Other nonspecific abnormal finding of lung field: Secondary | ICD-10-CM

## 2019-09-14 NOTE — Telephone Encounter (Signed)
-----   Message from Melrose Nakayama, MD sent at 09/14/2019  1:10 PM EDT ----- Regarding: RE: CT call report OK thanks  [facility] ----- Message ----- From: Donnella Sham, RN Sent: 09/14/2019  12:12 PM EDT To: Melrose Nakayama, MD Subject: CT call report                                 [facility] called about patient's recent CT scan.  Lung nodule changes.  She is scheduled to see you this coming Tuesday, 7/13 for a follow-up.  FYI.  Thanks,  Caryl Pina

## 2019-09-15 DIAGNOSIS — J9601 Acute respiratory failure with hypoxia: Secondary | ICD-10-CM | POA: Diagnosis not present

## 2019-09-15 DIAGNOSIS — A310 Pulmonary mycobacterial infection: Secondary | ICD-10-CM | POA: Diagnosis not present

## 2019-09-15 DIAGNOSIS — J440 Chronic obstructive pulmonary disease with acute lower respiratory infection: Secondary | ICD-10-CM | POA: Diagnosis not present

## 2019-09-15 DIAGNOSIS — N179 Acute kidney failure, unspecified: Secondary | ICD-10-CM | POA: Diagnosis not present

## 2019-09-15 DIAGNOSIS — U071 COVID-19: Secondary | ICD-10-CM | POA: Diagnosis not present

## 2019-09-15 DIAGNOSIS — D649 Anemia, unspecified: Secondary | ICD-10-CM | POA: Diagnosis not present

## 2019-09-18 ENCOUNTER — Ambulatory Visit (INDEPENDENT_AMBULATORY_CARE_PROVIDER_SITE_OTHER): Payer: Medicare Other | Admitting: Podiatry

## 2019-09-18 ENCOUNTER — Encounter: Payer: Self-pay | Admitting: Podiatry

## 2019-09-18 ENCOUNTER — Telehealth: Payer: Self-pay | Admitting: Pharmacist

## 2019-09-18 ENCOUNTER — Other Ambulatory Visit: Payer: Self-pay

## 2019-09-18 DIAGNOSIS — A310 Pulmonary mycobacterial infection: Secondary | ICD-10-CM

## 2019-09-18 DIAGNOSIS — L97521 Non-pressure chronic ulcer of other part of left foot limited to breakdown of skin: Secondary | ICD-10-CM | POA: Diagnosis not present

## 2019-09-18 MED ORDER — LINEZOLID 600 MG PO TABS: 600.0000 mg | ORAL_TABLET | Freq: Every day | ORAL | 11 refills | Status: DC

## 2019-09-18 NOTE — Telephone Encounter (Signed)
Patient's daughter called stating that patient had 2 linezolid pills left and is in need of a refill. She said that it usually needs an authorization so she wanted to call here first.  It looks like her PA is approved until mid August, so she should have no issues picking it up at [facility]. Verified with Inez Catalina and it went through with no problem. She will go to [facility] and pick it up today or tomorrow and call me if there are any issues.

## 2019-09-19 ENCOUNTER — Ambulatory Visit (INDEPENDENT_AMBULATORY_CARE_PROVIDER_SITE_OTHER): Payer: Medicare Other | Admitting: Thoracic Surgery (Cardiothoracic Vascular Surgery)

## 2019-09-19 ENCOUNTER — Encounter: Payer: Self-pay | Admitting: Thoracic Surgery (Cardiothoracic Vascular Surgery)

## 2019-09-19 VITALS — BP 120/68 | HR 88 | Temp 97.9°F | Resp 20 | Ht 60.0 in | Wt 82.0 lb

## 2019-09-19 DIAGNOSIS — R918 Other nonspecific abnormal finding of lung field: Secondary | ICD-10-CM

## 2019-09-19 NOTE — Progress Notes (Signed)
[address]       [place] [address]             [phone]      HPI: [name] returns for a scheduled follow-up visit  Olivia Hooper is an 79 year old woman with a history of tobacco abuse, COPD, rheumatoid arthritis, breast cancer, hyperparathyroidism, hyperlipidemia, osteoporosis, and vertigo.  She presented in the fall 2020 with hemoptysis.  She had a cavitary right upper lobe mass and multiple smaller lung nodules.  Bronchoscopy revealed atypical mycobacterial infection with MAC.  Biopsies were negative for tumor.  She has been followed since then.  She is followed by Dr. Tommy Hooper and is on antimicrobial therapy for the MAC.  She is currently on cefoxitin, Diflucan, and linezolid.  She has been off and on those medications with holidays due to some side effects.  She has not had any recurrent hemoptysis.  Her respiratory status is stable.  She has chronic back pain but no acute back pain.  She denies any numbness or weakness in her extremities.  Past Medical History:  Diagnosis Date  . Anemia 05/03/2019  . Breast cancer ([place])    on left  . Carpal tunnel syndrome   . COPD (chronic obstructive pulmonary disease) ([place])   . Drug-induced anemia 05/03/2019  . Dyspnea   . Hypercalcemia   . Hyperlipidemia   . Hyperparathyroidism ([place])   . Osteoporosis   . Pneumonia   . Pulmonary mycobacterial infection ([place]) 03/14/2019  . RA (rheumatoid arthritis) ([place])   . Sleeping excessive 05/03/2019  . Vertigo   . Vitamin D deficiency     Current Outpatient Medications  Medication Sig Dispense Refill  . acetaminophen (TYLENOL) 500 MG tablet Take 500 mg by mouth every 8 (eight) hours as needed for mild pain.     Marland Kitchen albuterol (VENTOLIN HFA) 108 (90 Base) MCG/ACT inhaler Inhale 2 puffs into the lungs every 6 (six) hours as needed for wheezing or shortness of breath.     . AMBULATORY NON FORMULARY MEDICATION Take 100 mg by mouth daily. Medication Name: clofazimine for  mycobacterium abscessus pulmonary infection 100 capsule 2  . ascorbic acid (VITAMIN C) 500 MG tablet Take 1 tablet (500 mg total) by mouth 2 (two) times daily. 30 tablet 0  . cefOXitin (MEFOXIN) 2 g injection     . cefOXitin 8 g in dextrose 5 % 50 mL Inject 8 g into the vein continuous. 8 g 11  . diphenoxylate-atropine (LOMOTIL) 2.5-0.025 MG/5ML liquid Take 5 mLs by mouth 4 (four) times daily as needed for diarrhea or loose stools. 60 mL 0  . fluconazole (DIFLUCAN) 100 MG tablet Take 2 tablets (200 mg total) by mouth daily. 20 tablet 0  . guaiFENesin-dextromethorphan (ROBITUSSIN DM) 100-10 MG/5ML syrup Take 5 mLs by mouth every 4 (four) hours as needed for cough. 118 mL 0  . Ipratropium-Albuterol (COMBIVENT) 20-100 MCG/ACT AERS respimat Inhale 2 puffs into the lungs every 6 (six) hours. 4 g 0  . lidocaine-prilocaine (EMLA) cream Apply 1 application topically as needed. (Patient taking differently: Apply 1 application topically as needed (access port). ) 30 g 0  . linezolid (ZYVOX) 600 MG tablet Take 1 tablet (600 mg total) by mouth daily. 30 tablet 11  . mometasone-formoterol (DULERA) 100-5 MCG/ACT AERO Inhale 2 puffs into the lungs 2 (two) times daily as needed for wheezing or shortness of breath.     . mupirocin ointment (BACTROBAN) 2 % Apply 1 application  topically 2 (two) times daily. 30 g 2  . Polyethyl Glycol-Propyl Glycol (SYSTANE OP) Place 1 drop into both eyes daily as needed (dry eyes).    . torsemide (DEMADEX) 5 MG tablet Take 5 mg by mouth daily.    . traMADol (ULTRAM) 50 MG tablet Take 50 mg by mouth 2 (two) times daily as needed for moderate pain.     Marland Kitchen zinc sulfate 220 (50 Zn) MG capsule Take 1 capsule (220 mg total) by mouth daily.    Marland Kitchen sulfamethoxazole-trimethoprim (BACTRIM DS) 800-160 MG tablet Take 1 tablet by mouth 2 (two) times daily. (Patient not taking: Reported on 09/19/2019) 14 tablet 0   No current facility-administered medications for this visit.    Physical Exam Frail  79 year old woman in no acute distress Alert and oriented x3 with no focal neurologic deficit Cachectic Lungs diminished bilaterally, no wheezing Cardiac regular rate and rhythm Kyphoscoliosis  Diagnostic Tests: CT CHEST WITHOUT CONTRAST  TECHNIQUE: Multidetector CT imaging of the chest was performed following the standard protocol without IV contrast.  COMPARISON:  June 08, 2019  FINDINGS: Cardiovascular: Calcified atheromatous plaque, extensive plaque in the thoracic aorta. Ascending thoracic aorta nonaneurysmal. Heart size is stable with signs of coronary artery calcification as before.  Central pulmonary vasculature mildly engorged. Limited assessment of vascular structures in the chest due to lack of intravenous contrast.  RIGHT-sided Port-A-Cath terminating at the caval to atrial junction.  Mediastinum/Nodes: No thoracic inlet adenopathy. No axillary lymphadenopathy. No mediastinal lymphadenopathy.  Lungs/Pleura: Area of cavitation in the RIGHT upper lobe (image 35, series 8) 4.6 x 2.8 cm within 1 mm of previous measurements in terms of greatest dimension perhaps slightly diminished in short axis compared to the prior study where it measured approximately 3 cm in short axis. When compared to the study of February of 2021 this has decreased in size. Area more cephalad but in communication with the dominant cavitary focus may also be slightly smaller as compared to February of 2021 measuring approximately 4 cm as compared to 4.8 cm. Wall thickness appears less conspicuous than on previous imaging as well.  Cavitary nodule in the anterior RIGHT upper lobe (image 65, series 8) 11 mm, previously 13 mm. Subtle areas of nodularity with increasing conspicuity in the central RIGHT upper lobe (image 57 and image 60) these measure 5-6 mm and are enlarged from 2-3 and 3-4 mm respectively on the prior study.  Peri fissural nodules are similar.  Small RIGHT middle  lobe nodule approximately 6 mm (image 94, series 8) unchanged.  Basilar opacities and nodularity with similar appearance but with new area of nodularity abutting the pleura just above the costodiaphragmatic sulcus (image 115, series 8) 9 mm previously approximately 5 mm.  Multifocal areas of nodularity in the LEFT chest with similar appearance and with areas of tree-in-bud opacity as well. Background pulmonary emphysema. Some material in segmental level bronchi bilaterally.  Upper Abdomen: Limited assessment of upper abdominal structures without acute process.  Musculoskeletal: LEFT breast implant in place. Osteopenia. Spinal degenerative changes. Midthoracic compression fractures with similar appearance. New upper thoracic compression fracture since April of 2021 with posterior cortical involvement and mild-to-moderate retropulsion of posterior cortical elements and near complete loss of height, near vertebral plana at the T3 level. Sternum is intact. Spinal alignment is changed only in the area of fracture where there is mild to moderate kyphosis.  IMPRESSION: 1. Dominant area of cavitation in the RIGHT upper lobe may be slightly improved compared to previous imaging. 2.  Waxing and waning pulmonary nodules elsewhere in the chest on balance with trend of improvement since February but quite similar to the study of June 08, 2019 again suggesting chronic, atypical infection 3. Mild canal narrowing and kyphosis at the site of a new near complete compression fracture of T3. Presence of sclerosis at this level suggests chronicity as does the formation of anterior osteophytes along the anterior margin of the fracture but this is occurred since June 08, 2019. Correlation with symptoms may be helpful with MRI for further assessment as warranted. 4. Similar appearance of multifocal areas of nodularity in the LEFT chest with areas of tree-in-bud opacity as well. Findings may  represent chronic atypical infection, potentially MAI. 5. Emphysema and aortic atherosclerosis.  These results will be called to the ordering clinician or representative by the Radiologist Assistant, and communication documented in the PACS or [employer].  Aortic Atherosclerosis (ICD10-I70.0) and Emphysema (ICD10-J43.9).   Electronically Signed   By: Zetta Bills M.D.   On: 09/14/2019 11:34 I personally reviewed the CT images and concur with the findings noted above  Impression: Olivia Hooper is an 79 year old woman  with a history of tobacco abuse, COPD, rheumatoid arthritis, breast cancer, hyperparathyroidism, hyperlipidemia, osteoporosis, and vertigo.  She presented last fall with hemoptysis.  Work-up revealed cavitary lung masses.  Biopsies were negative for cancer.  Cultures grew out MAC.  MAC infection-overall appearance slightly improved compared to her last scan 3 months ago.  It still and possible rule out an underlying neoplasm given the size and complexity of her lesions.  However if this was a primary cancer you would expect it to have grown over the past 6 months.  We will plan to check another CT in 6 months.  In the meantime she will continue with antibiotic therapy under the care of Dr. Tommy Hooper.  T3 compression fracture-given the mild canal narrowing I recommended that she see a spine surgeon.  Unsure if surgery would be an option or injecting the vertebral body would be possible.  I do think this needs to be evaluated by someone with knowledge in the area.  We will make that referral.  Plan: We will refer to neurosurgery regarding T3 compression fracture.  Will defer to them whether to do MRI. Continue antibiotics Return in 6 months with CT chest  Melrose Nakayama, MD [facility] [phone]

## 2019-09-20 DIAGNOSIS — J9601 Acute respiratory failure with hypoxia: Secondary | ICD-10-CM | POA: Diagnosis not present

## 2019-09-20 DIAGNOSIS — U071 COVID-19: Secondary | ICD-10-CM | POA: Diagnosis not present

## 2019-09-20 DIAGNOSIS — A310 Pulmonary mycobacterial infection: Secondary | ICD-10-CM | POA: Diagnosis not present

## 2019-09-20 DIAGNOSIS — N179 Acute kidney failure, unspecified: Secondary | ICD-10-CM | POA: Diagnosis not present

## 2019-09-20 DIAGNOSIS — D649 Anemia, unspecified: Secondary | ICD-10-CM | POA: Diagnosis not present

## 2019-09-20 DIAGNOSIS — J440 Chronic obstructive pulmonary disease with acute lower respiratory infection: Secondary | ICD-10-CM | POA: Diagnosis not present

## 2019-09-21 ENCOUNTER — Telehealth: Payer: Self-pay

## 2019-09-21 NOTE — Telephone Encounter (Signed)
Received call from [facility] RN, Langley Gauss requesting labs orders. Prior to stopping previous antibiotics and slowly restarting  All antibiotics labs were discontinued. RN requesting new lab orders. Previous lab were CBC w/ diff and BMP twice weekly. Routing to provider for advise.  Eugenia Mcalpine

## 2019-09-24 NOTE — Telephone Encounter (Signed)
Is the pt back on all abx she was on before?  If kidney fxn stable once weekly CBC and bmp sufficient

## 2019-09-25 NOTE — Telephone Encounter (Signed)
Ok perfect

## 2019-09-25 NOTE — Telephone Encounter (Signed)
Yes she is

## 2019-09-25 NOTE — Progress Notes (Signed)
Subjective: 79 year old female presents the office today for follow-up evaluation of a wound to the left foot.  She states the wound is healed.  Denies any swelling or redness or any drainage or any new open sores.  She is been in surgical shoe with offloading.  She has no other concerns today. She denies any fevers, chills, nausea, vomiting.  No calf pain, chest pain, shortness of breath.     Objective: AAO x3, NAD DP/PT pulses palpable bilaterally, CRT less than 3 seconds On the plantar aspect the left hallux is a hyperkeratotic lesion however much smaller and upon debridement there is no underlying ulceration identified today there is new, healthy skin present.  There is no edema, erythema or signs of infection present.   No other open lesions or preulcerative lesions. No pain with calf compression, swelling, warmth, erythema  Assessment: Resolved ulceration left foot  Plan: -All treatment options discussed with the patient including all alternatives, risks, complications.  -Debrided hyperkeratotic tissue with any complications or bleeding.  Wound appears to be healed.  Slowly transition back to regular shoe but monitor daily for any recurrence of the wound or any new issues.  Return in about 3 months (around 12/19/2019).  Shanique Slade DPM

## 2019-09-25 NOTE — Telephone Encounter (Signed)
Thanks. [place] [facility] agency made aware of lab orders.

## 2019-09-26 ENCOUNTER — Other Ambulatory Visit: Payer: Self-pay

## 2019-09-26 ENCOUNTER — Ambulatory Visit (INDEPENDENT_AMBULATORY_CARE_PROVIDER_SITE_OTHER): Payer: Medicare Other | Admitting: Pharmacist

## 2019-09-26 DIAGNOSIS — A310 Pulmonary mycobacterial infection: Secondary | ICD-10-CM | POA: Diagnosis not present

## 2019-09-26 NOTE — Telephone Encounter (Signed)
Confirmed orders with Stanton Kidney at [facility]. Landis Gandy, RN

## 2019-09-26 NOTE — Progress Notes (Signed)
HPI: Olivia Hooper is a 79 y.o. female who presents to the [facility] [place] clinic for follow up of her mycobacterium abscessus pulmonary infection.  Patient Active Problem List   Diagnosis Date Noted  . Pressure injury of skin 05/19/2019  . COVID-19 virus infection 05/18/2019  . Symptomatic anemia 05/17/2019  . AKI (acute kidney injury) ([place]) 05/17/2019  . Cardiomegaly 05/17/2019  . Anemia 05/03/2019  . Drug-induced anemia 05/03/2019  . Sleeping excessive 05/03/2019  . Pulmonary mycobacterial infection ([place]) 03/14/2019  . Cavitating mass in right upper lung lobe 01/31/2019  . S/P eye surgery 01/25/2015  . 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 Age-related macular degeneration, dry, both eyes 11/30/2014  . Pseudophakia of both eyes 11/30/2014  . Pulmonary nodules 03/18/2014  . Cigarette smoker 03/18/2014  . COPD GOLD II / still smoking  03/15/2014  . Profound fatigue 02/18/2011  . Encounter for long-term (current) use of other medications 01/08/2011  . Rheumatoid arthritis ([place]) 08/21/2009    Patient's Medications  New Prescriptions   No medications on file  Previous Medications   ACETAMINOPHEN (TYLENOL) 500 MG TABLET    Take 500 mg by mouth every 8 (eight) hours as needed for mild pain.    ALBUTEROL (VENTOLIN HFA) 108 (90 BASE) MCG/ACT INHALER    Inhale 2 puffs into the lungs every 6 (six) hours as needed for wheezing or shortness of breath.    AMBULATORY NON FORMULARY MEDICATION    Take 100 mg by mouth daily. Medication Name: clofazimine for mycobacterium abscessus pulmonary infection   ASCORBIC ACID (VITAMIN C) 500 MG TABLET    Take 1 tablet (500 mg total) by mouth 2 (two) times daily.   CEFOXITIN (MEFOXIN) 2 G INJECTION       CEFOXITIN 8 G IN DEXTROSE 5 % 50 ML    Inject 8 g into the vein continuous.   DIPHENOXYLATE-ATROPINE (LOMOTIL) 2.5-0.025 MG/5ML LIQUID    Take 5 mLs by mouth 4 (four) times daily  as needed for diarrhea or loose stools.   FLUCONAZOLE (DIFLUCAN) 100 MG TABLET    Take 2 tablets (200 mg total) by mouth daily.   GUAIFENESIN-DEXTROMETHORPHAN (ROBITUSSIN DM) 100-10 MG/5ML SYRUP    Take 5 mLs by mouth every 4 (four) hours as needed for cough.   IPRATROPIUM-ALBUTEROL (COMBIVENT) 20-100 MCG/ACT AERS RESPIMAT    Inhale 2 puffs into the lungs every 6 (six) hours.   LIDOCAINE-PRILOCAINE (EMLA) CREAM    Apply 1 application topically as needed.   LINEZOLID (ZYVOX) 600 MG TABLET    Take 1 tablet (600 mg total) by mouth daily.   MOMETASONE-FORMOTEROL (DULERA) 100-5 MCG/ACT AERO    Inhale 2 puffs into the lungs 2 (two) times daily as needed for wheezing or shortness of breath.    MUPIROCIN OINTMENT (BACTROBAN) 2 %    Apply 1 application topically 2 (two) times daily.   POLYETHYL GLYCOL-PROPYL GLYCOL (SYSTANE OP)    Place 1 drop into both eyes daily as needed (dry eyes).   SULFAMETHOXAZOLE-TRIMETHOPRIM (BACTRIM DS) 800-160 MG TABLET    Take 1 tablet by mouth 2 (two) times daily.   TORSEMIDE (DEMADEX) 5 MG TABLET    Take 5 mg by mouth daily.   TRAMADOL (ULTRAM) 50 MG TABLET    Take 50 mg by mouth 2 (two) times daily as needed for moderate pain.    ZINC SULFATE 220 (50 ZN) MG CAPSULE    Take 1 capsule (220 mg  total) by mouth daily.  Modified Medications   No medications on file  Discontinued Medications   No medications on file    Allergies: Allergies  Allergen Reactions  . Buprenorphine Hcl Nausea And Vomiting  . Morphine And Related Nausea And Vomiting         Past Medical History: Past Medical History:  Diagnosis Date  . Anemia 05/03/2019  . Breast cancer ([place])    on left  . Carpal tunnel syndrome   . COPD (chronic obstructive pulmonary disease) ([place])   . Drug-induced anemia 05/03/2019  . Dyspnea   . Hypercalcemia   . Hyperlipidemia   . Hyperparathyroidism ([place])   . Osteoporosis   . Pneumonia   . Pulmonary mycobacterial infection ([place]) 03/14/2019  . RA (rheumatoid  arthritis) ([place])   . Sleeping excessive 05/03/2019  . Vertigo   . Vitamin D deficiency     Social History: Social History   Socioeconomic History  . Marital status: Widowed    Spouse name: Not on file  . Number of children: 4  . Years of education: 12+  . Highest education level: Associate degree: occupational, Hotel manager, or vocational program  Occupational History  . Occupation: Retired   Tobacco Use  . Smoking status: Light Tobacco Smoker    Packs/day: 0.25    Years: 62.00    Pack years: 15.50    Types: Cigarettes    Last attempt to quit: 01/12/2019    Years since quitting: 0.7  . Smokeless tobacco: Never Used  Vaping Use  . Vaping Use: Never used  Substance and Sexual Activity  . Alcohol use: No    Alcohol/week: 0.0 standard drinks  . Drug use: No  . Sexual activity: Not on file  Other Topics Concern  . Not on file  Social History Narrative   Lives alone, 4 children, independent.   Social Determinants of Health   Financial Resource Strain: Low Risk   . Difficulty of Paying Living Expenses: Not hard at all  Food Insecurity: No Food Insecurity  . Worried About Charity fundraiser in the Last Year: Never true  . Ran Out of Food in the Last Year: Never true  Transportation Needs: No Transportation Needs  . Lack of Transportation (Medical): No  . Lack of Transportation (Non-Medical): No  Physical Activity: Inactive  . Days of Exercise per Week: 0 days  . Minutes of Exercise per Session: 0 min  Stress: No Stress Concern Present  . Feeling of Stress : Only a little  Social Connections: Moderately Integrated  . Frequency of Communication with Friends and Family: More than three times a week  . Frequency of Social Gatherings with Friends and Family: More than three times a week  . Attends Religious Services: More than 4 times per year  . Active Member of Clubs or Organizations: Yes  . Attends Archivist Meetings: More than 4 times per year  . Marital  Status: Widowed    Assessment: Leisl is here today accompanied by one of her granddaughters. She is doing fairly well since restarting all three of her antibiotics for her mycobacterium abscessus pulmonary infection. She was initially having issues tolerating these antibiotics several months ago, so she was instructed to stop all of them and restart them slowly. She first started linezolid on 6/19, then introduced clofazimine on 6/27, and then restarted IV cefoxitin ~7/8.   She states that she is tolerating them better this time around and is appreciative for the break she got  from them a few weeks ago. She is wondering if she can do that every once and while, and I told her to discuss this with Dr. Tommy Medal. She is still having some diarrhea but the Lomotil Rx that I prescribed for her is helping. She had 3 episodes this morning but states that it is not every morning. I instructed her to stay hydrated and call us if she had 3-4 watery stools for several days in a row.   She is also still having issues with hair loss. I have researched these antibiotics and cannot find a correlation. Bradd Canary, a NP that works with her messaged me and thinks it is due to her hypoparathyroidism. She asked that we check some routine labs. The patient does not wish to have labs drawn today in office, so I will add those onto her home health labs for a one time draw. This issue should really be dealt with by her PCP, but I will order these for a one time check.   She is also having some depression that has started recently to where she doesn't want to leave the house. She will discuss with her PCP. I told her to continue doing a great job with her antibiotics and she seemed in pretty good spirits today. She will continue and see Dr. Tommy Medal in ~1 month.  Of note, I have a refill of her clofazimine here for her but she states that she has 3 bottles left at home.  Plan: - Continue linezolid 600 mg PO daily - Continue  clofazimine 100 mg (2 capsules) PO daily - Continue continuous infusion cefoxitin - Will add a vitamin D level - F/u with Dr. Tommy Medal 8/16 at 11:15am  Barri Neidlinger L. Eber Hong, PharmD, BCIDP, AAHIVP, CPP Clinical Pharmacist Practitioner Infectious Diseases [facility] 09/27/2019, 9:01 AM

## 2019-09-27 ENCOUNTER — Telehealth: Payer: Self-pay | Admitting: Pharmacist

## 2019-09-27 ENCOUNTER — Encounter: Payer: Self-pay | Admitting: Infectious Disease

## 2019-09-27 DIAGNOSIS — D649 Anemia, unspecified: Secondary | ICD-10-CM | POA: Diagnosis not present

## 2019-09-27 DIAGNOSIS — A310 Pulmonary mycobacterial infection: Secondary | ICD-10-CM | POA: Diagnosis not present

## 2019-09-27 DIAGNOSIS — J440 Chronic obstructive pulmonary disease with acute lower respiratory infection: Secondary | ICD-10-CM | POA: Diagnosis not present

## 2019-09-27 DIAGNOSIS — U071 COVID-19: Secondary | ICD-10-CM | POA: Diagnosis not present

## 2019-09-27 DIAGNOSIS — J9601 Acute respiratory failure with hypoxia: Secondary | ICD-10-CM | POA: Diagnosis not present

## 2019-09-27 DIAGNOSIS — N179 Acute kidney failure, unspecified: Secondary | ICD-10-CM | POA: Diagnosis not present

## 2019-09-27 NOTE — Telephone Encounter (Signed)
Called and spoke to Grayson at [facility] and gave verbal order to check a one time vitamin D level at patient's next lab draw. It will be drawn next week with her weekly labs. Her calcium will be on her weekly BMP.  Mary verbalized understanding.

## 2019-09-28 DIAGNOSIS — S22030A Wedge compression fracture of third thoracic vertebra, initial encounter for closed fracture: Secondary | ICD-10-CM | POA: Diagnosis not present

## 2019-09-30 DIAGNOSIS — A310 Pulmonary mycobacterial infection: Secondary | ICD-10-CM | POA: Diagnosis not present

## 2019-09-30 DIAGNOSIS — Z853 Personal history of malignant neoplasm of breast: Secondary | ICD-10-CM | POA: Diagnosis not present

## 2019-09-30 DIAGNOSIS — Z87891 Personal history of nicotine dependence: Secondary | ICD-10-CM | POA: Diagnosis not present

## 2019-09-30 DIAGNOSIS — H353130 Nonexudative age-related macular degeneration, bilateral, stage unspecified: Secondary | ICD-10-CM | POA: Diagnosis not present

## 2019-09-30 DIAGNOSIS — J440 Chronic obstructive pulmonary disease with acute lower respiratory infection: Secondary | ICD-10-CM | POA: Diagnosis not present

## 2019-09-30 DIAGNOSIS — Z9981 Dependence on supplemental oxygen: Secondary | ICD-10-CM | POA: Diagnosis not present

## 2019-09-30 DIAGNOSIS — M069 Rheumatoid arthritis, unspecified: Secondary | ICD-10-CM | POA: Diagnosis not present

## 2019-09-30 DIAGNOSIS — E559 Vitamin D deficiency, unspecified: Secondary | ICD-10-CM | POA: Diagnosis not present

## 2019-09-30 DIAGNOSIS — J9601 Acute respiratory failure with hypoxia: Secondary | ICD-10-CM | POA: Diagnosis not present

## 2019-09-30 DIAGNOSIS — E785 Hyperlipidemia, unspecified: Secondary | ICD-10-CM | POA: Diagnosis not present

## 2019-09-30 DIAGNOSIS — E43 Unspecified severe protein-calorie malnutrition: Secondary | ICD-10-CM | POA: Diagnosis not present

## 2019-09-30 DIAGNOSIS — I700 Atherosclerosis of aorta: Secondary | ICD-10-CM | POA: Diagnosis not present

## 2019-09-30 DIAGNOSIS — I051 Rheumatic mitral insufficiency: Secondary | ICD-10-CM | POA: Diagnosis not present

## 2019-09-30 DIAGNOSIS — D649 Anemia, unspecified: Secondary | ICD-10-CM | POA: Diagnosis not present

## 2019-09-30 DIAGNOSIS — Z452 Encounter for adjustment and management of vascular access device: Secondary | ICD-10-CM | POA: Diagnosis not present

## 2019-09-30 DIAGNOSIS — I517 Cardiomegaly: Secondary | ICD-10-CM | POA: Diagnosis not present

## 2019-09-30 DIAGNOSIS — Z792 Long term (current) use of antibiotics: Secondary | ICD-10-CM | POA: Diagnosis not present

## 2019-09-30 DIAGNOSIS — R918 Other nonspecific abnormal finding of lung field: Secondary | ICD-10-CM | POA: Diagnosis not present

## 2019-09-30 DIAGNOSIS — M810 Age-related osteoporosis without current pathological fracture: Secondary | ICD-10-CM | POA: Diagnosis not present

## 2019-09-30 DIAGNOSIS — Z9012 Acquired absence of left breast and nipple: Secondary | ICD-10-CM | POA: Diagnosis not present

## 2019-09-30 DIAGNOSIS — Z9181 History of falling: Secondary | ICD-10-CM | POA: Diagnosis not present

## 2019-09-30 DIAGNOSIS — Z7952 Long term (current) use of systemic steroids: Secondary | ICD-10-CM | POA: Diagnosis not present

## 2019-09-30 DIAGNOSIS — E213 Hyperparathyroidism, unspecified: Secondary | ICD-10-CM | POA: Diagnosis not present

## 2019-09-30 DIAGNOSIS — N179 Acute kidney failure, unspecified: Secondary | ICD-10-CM | POA: Diagnosis not present

## 2019-10-04 ENCOUNTER — Encounter: Payer: Self-pay | Admitting: Infectious Disease

## 2019-10-04 DIAGNOSIS — N179 Acute kidney failure, unspecified: Secondary | ICD-10-CM | POA: Diagnosis not present

## 2019-10-04 DIAGNOSIS — J440 Chronic obstructive pulmonary disease with acute lower respiratory infection: Secondary | ICD-10-CM | POA: Diagnosis not present

## 2019-10-04 DIAGNOSIS — A310 Pulmonary mycobacterial infection: Secondary | ICD-10-CM | POA: Diagnosis not present

## 2019-10-04 DIAGNOSIS — J9601 Acute respiratory failure with hypoxia: Secondary | ICD-10-CM | POA: Diagnosis not present

## 2019-10-04 DIAGNOSIS — D649 Anemia, unspecified: Secondary | ICD-10-CM | POA: Diagnosis not present

## 2019-10-04 DIAGNOSIS — M069 Rheumatoid arthritis, unspecified: Secondary | ICD-10-CM | POA: Diagnosis not present

## 2019-10-11 ENCOUNTER — Other Ambulatory Visit: Payer: Self-pay | Admitting: *Deleted

## 2019-10-11 ENCOUNTER — Encounter: Payer: Self-pay | Admitting: Infectious Disease

## 2019-10-11 DIAGNOSIS — A310 Pulmonary mycobacterial infection: Secondary | ICD-10-CM | POA: Diagnosis not present

## 2019-10-11 DIAGNOSIS — J440 Chronic obstructive pulmonary disease with acute lower respiratory infection: Secondary | ICD-10-CM | POA: Diagnosis not present

## 2019-10-11 DIAGNOSIS — D649 Anemia, unspecified: Secondary | ICD-10-CM | POA: Diagnosis not present

## 2019-10-11 DIAGNOSIS — J9601 Acute respiratory failure with hypoxia: Secondary | ICD-10-CM | POA: Diagnosis not present

## 2019-10-11 DIAGNOSIS — M069 Rheumatoid arthritis, unspecified: Secondary | ICD-10-CM | POA: Diagnosis not present

## 2019-10-11 DIAGNOSIS — N179 Acute kidney failure, unspecified: Secondary | ICD-10-CM | POA: Diagnosis not present

## 2019-10-11 NOTE — Patient Outreach (Signed)
[facility]) Care Management  10/11/2019  Olivia Hooper 20-Sep-1940 [id number]   Telephone outreach.  [name] is doing fair. She is tolerating her chemo antibiotic tx well but she is tired of carrying around the equipment for the IV infusions.  Her RA has flared up in her hands. She can take a tramadol prn which she does and uses Voltaren gel which gives some relief.  The ID pharmacist has collaborated with NP to consider drawing labs to check on her parathyroid level and BMET (which includes calcium) both could be contributing to her alopecia. These are to be done today.  No weight change continues to be at 82#. She says she is just skin and bones. She feels like she has a decent appetite and does eat regular meals and supplements.  [name] has not received COVID vaccination. She had COVID this year. She has been advised her condition is too fragile to receive it. NP advised she must be extremely careful since the DELTA variant is so prevalent now. Avoid public areas. Wear mask, stay 6' from others, wash hands. She is to advise her family to do the same. The ones that visit her are vaccinated and they do wear masks.  We agreed to talk again in one month. Encouraged her to remember I am available for problems. Questions or for moral support.  [name]. [name], MSN, [facility] Gerontological Nurse Practitioner [facility] Care Management [phone]

## 2019-10-18 ENCOUNTER — Encounter: Payer: Self-pay | Admitting: Infectious Disease

## 2019-10-18 DIAGNOSIS — N179 Acute kidney failure, unspecified: Secondary | ICD-10-CM | POA: Diagnosis not present

## 2019-10-18 DIAGNOSIS — D649 Anemia, unspecified: Secondary | ICD-10-CM | POA: Diagnosis not present

## 2019-10-18 DIAGNOSIS — M069 Rheumatoid arthritis, unspecified: Secondary | ICD-10-CM | POA: Diagnosis not present

## 2019-10-18 DIAGNOSIS — J9601 Acute respiratory failure with hypoxia: Secondary | ICD-10-CM | POA: Diagnosis not present

## 2019-10-18 DIAGNOSIS — A310 Pulmonary mycobacterial infection: Secondary | ICD-10-CM | POA: Diagnosis not present

## 2019-10-18 DIAGNOSIS — J440 Chronic obstructive pulmonary disease with acute lower respiratory infection: Secondary | ICD-10-CM | POA: Diagnosis not present

## 2019-10-23 ENCOUNTER — Other Ambulatory Visit: Payer: Self-pay

## 2019-10-23 ENCOUNTER — Ambulatory Visit (INDEPENDENT_AMBULATORY_CARE_PROVIDER_SITE_OTHER): Payer: Medicare Other | Admitting: Infectious Disease

## 2019-10-23 ENCOUNTER — Encounter: Payer: Self-pay | Admitting: Infectious Disease

## 2019-10-23 VITALS — BP 132/74 | HR 89

## 2019-10-23 DIAGNOSIS — M05731 Rheumatoid arthritis with rheumatoid factor of right wrist without organ or systems involvement: Secondary | ICD-10-CM

## 2019-10-23 DIAGNOSIS — R630 Anorexia: Secondary | ICD-10-CM | POA: Diagnosis not present

## 2019-10-23 DIAGNOSIS — M05732 Rheumatoid arthritis with rheumatoid factor of left wrist without organ or systems involvement: Secondary | ICD-10-CM

## 2019-10-23 DIAGNOSIS — E213 Hyperparathyroidism, unspecified: Secondary | ICD-10-CM

## 2019-10-23 DIAGNOSIS — L659 Nonscarring hair loss, unspecified: Secondary | ICD-10-CM | POA: Diagnosis not present

## 2019-10-23 DIAGNOSIS — F17210 Nicotine dependence, cigarettes, uncomplicated: Secondary | ICD-10-CM | POA: Diagnosis not present

## 2019-10-23 DIAGNOSIS — J449 Chronic obstructive pulmonary disease, unspecified: Secondary | ICD-10-CM | POA: Diagnosis not present

## 2019-10-23 DIAGNOSIS — R5383 Other fatigue: Secondary | ICD-10-CM

## 2019-10-23 DIAGNOSIS — J984 Other disorders of lung: Secondary | ICD-10-CM | POA: Diagnosis not present

## 2019-10-23 DIAGNOSIS — R918 Other nonspecific abnormal finding of lung field: Secondary | ICD-10-CM

## 2019-10-23 DIAGNOSIS — Z7189 Other specified counseling: Secondary | ICD-10-CM | POA: Diagnosis not present

## 2019-10-23 DIAGNOSIS — Z7185 Encounter for immunization safety counseling: Secondary | ICD-10-CM

## 2019-10-23 DIAGNOSIS — A310 Pulmonary mycobacterial infection: Secondary | ICD-10-CM | POA: Diagnosis not present

## 2019-10-23 HISTORY — DX: Anorexia: R63.0

## 2019-10-23 HISTORY — DX: Other specified counseling: Z71.89

## 2019-10-23 HISTORY — DX: Hyperparathyroidism, unspecified: E21.3

## 2019-10-23 HISTORY — DX: Encounter for immunization safety counseling: Z71.85

## 2019-10-23 HISTORY — DX: Nonscarring hair loss, unspecified: L65.9

## 2019-10-23 NOTE — Progress Notes (Signed)
Subjective:   Chief complaint: Continued trouble with hair loss and malaise poor appetite   Patient ID: Olivia Hooper, female    DOB: [birth year], 79 y.o.   MRN: [id number]  HPI  79 year-old Caucasian lady with history of tobacco use chronic obstructive pulmonary disease severe rheumatoid arthritis, left breast cancer who has been on immunosuppressive therapy. She apparently had a 1 month history of hemoptysis. X-ray had shown a right upper lobe lung mass CT had shown 6.5 x 5.2 x 7.47 manner cavitary mass in the right upper lobe. There are multiple small lung nodules as well. There is also a small anterior mediastinal mass.  Patient underwent bronchoscopy with Dr. Tamala Julian with cytology is AFB fungal stains and cultures. 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 In the interval she had a PET CT scan that showed the cavitary mass which was very hypermetabolic patchy tree-in-bud opacities in both lungs there were hypermetabolic and a 3.9 1.6 cm anterior mediastinal soft tissue mass that was hypermetabolic.  There was anxiety about patient potentially having a thymoma but at present she was felt to be a high risk surgical patient when seen by Dr. Salomon Fick with cardiothoracic surgery.  I also agree with him that this mediastinal mass could be due to the mycobacterial abscessus infection.  These organisms are not notoriously difficult to treat still to have some anxiety the patient might need some surgical intervention to gain control of her chest pathology.  Her mycobacterium abscessus was =resistant to a lot of antibiotics but was sensitive to cefoxitin linezolid, amikacin andtigecycline.  She had previously lost 10 pounds of weight over the last half year.  She has stopped smoking now.  We crafted a regimen to treat her Mycobacterium abscessus with once daily clofazimine twice daily Zyvox and IV cefoxitin via continuous infusion. When I saw her  in late February she was5 weeks into treatment. She has been feeling progressively more fatigued and sleeping excessively according to both her and her son who accompanied her.  I noticed that she had dropped her hemoglobin down into the range of 7.4 after being 10. I was concerned that Zyvox might have been causing some myelosuppression though her platelets had not dropped and we dropped her Zyvox dose to once daily. In the interim she experienced worsening fatigue and anemia and was admitted hospital. Her hemoglobin had dropped below 7 into the 6 range. She was found to have COVID-19 and was treated with steroids and remdesivir her antibiotics were stopped altogether. She was seen by my partner Dr. Linus Salmons and and he had considered an  alternate regimen was considered including omadacycline.  Unfortunately even when  omadacycline was approved her cost for drugs was still 7000 due to the nature of her insurance.  We challenged her with low-dose Zyvox at 600 mg a day along with continued clofazimine and IV cefoxitin.  She has felt pretty poorly on this regimen and stop the meds altogether.  She wanted to get a second opinion at [facility] with Dr. Brigitte Pulse which she has now done.  In the interval she had resumed all medications.  Dr. Lorenda Cahill had recommended that she stop all medications for at least 4 days and I think she could even stop for longer.  He then recommends reintroducing these medications stepwise with introducing clofazimine first for 4 days followed by linezolid 600 mg daily for 4 days then followed by IV cefoxitin.  If 1 or more of these medications is  difficult for her to tolerate he suggested looking at some other alternative regimens if we can get them covered by her insurance including inhaled amikacin and bedaliquine along with having M abscessus sent from lab for bedaliquine sensitivities.  We proceeded with stepwise reintroduction of her antibiotics.  She is able to  tolerate them but she does continue to suffer from a poor appetite.  Along with this and along with weight loss that is persisted she has begun to lose her hair and much larger amounts than before.  She first noticed herself losing hair this past winter which may or may not of corresponded with when she was taking to biotics.  Her cough is improved dramatically since being on her current 3 drug regimen for Mycobacterium abscessus   Review of Systems  Constitutional: Positive for activity change, appetite change and fatigue. Negative for chills, diaphoresis, fever and unexpected weight change.  HENT: Negative for congestion, rhinorrhea, sinus pressure, sneezing, sore throat and trouble swallowing.   Eyes: Negative for photophobia and visual disturbance.  Respiratory: Negative for cough, chest tightness, shortness of breath, wheezing and stridor.   Cardiovascular: Negative for chest pain, palpitations and leg swelling.  Gastrointestinal: Negative for abdominal distention, abdominal pain, anal bleeding, blood in stool, constipation, diarrhea and vomiting.  Genitourinary: Negative for difficulty urinating, dysuria, flank pain and hematuria.  Musculoskeletal: Negative for arthralgias, back pain, gait problem, joint swelling and myalgias.  Skin: Negative for color change, pallor and rash.  Neurological: Negative for dizziness, tremors, weakness and light-headedness.  Hematological: Negative for adenopathy. Does not bruise/bleed easily.  Psychiatric/Behavioral: Negative for agitation, behavioral problems, confusion, decreased concentration and sleep disturbance.       Objective:   Physical Exam Constitutional:      General: She is not in acute distress.    Appearance: Normal appearance. She is well-developed and underweight.  HENT:     Head: Normocephalic and atraumatic.     Right Ear: Hearing and external ear normal.     Left Ear: Hearing and external ear normal.     Nose: No nasal deformity  or rhinorrhea.  Eyes:     General: No scleral icterus.    Conjunctiva/sclera: Conjunctivae normal.     Right eye: Right conjunctiva is not injected.     Left eye: Left conjunctiva is not injected.  Neck:     Vascular: No JVD.  Cardiovascular:     Rate and Rhythm: Normal rate and regular rhythm.     Heart sounds: S1 normal and S2 normal. No friction rub.  Pulmonary:     Effort: Prolonged expiration present. No respiratory distress.     Breath sounds: Normal breath sounds.  Abdominal:     General: There is no distension.     Palpations: Abdomen is soft.  Musculoskeletal:        General: Normal range of motion.     Right shoulder: Normal.     Left shoulder: Normal.     Cervical back: Normal range of motion and neck supple.     Right hip: Normal.     Left hip: Normal.     Right knee: Normal.     Left knee: Normal.  Lymphadenopathy:     Head:     Right side of head: No submandibular, preauricular or posterior auricular adenopathy.     Left side of head: No submandibular, preauricular or posterior auricular adenopathy.     Cervical: No cervical adenopathy.     Right cervical: No superficial or  deep cervical adenopathy.    Left cervical: No superficial or deep cervical adenopathy.  Skin:    General: Skin is warm and dry.     Coloration: Skin is not pale.     Findings: No abrasion, bruising, ecchymosis, erythema, lesion or rash.     Nails: There is no clubbing.  Neurological:     General: No focal deficit present.     Mental Status: She is alert and oriented to person, place, and time.     Sensory: No sensory deficit.     Coordination: Coordination normal.     Gait: Gait normal.  Psychiatric:        Attention and Perception: She is attentive.        Speech: Speech normal.        Behavior: Behavior normal. Behavior is cooperative.        Thought Content: Thought content normal.        Judgment: Judgment normal.           Assessment & Plan:  Mycocterium abscessus lung  infection:  I am concerned that treating her infection has caused her symptoms of malaise anorexia and this has worsened or gotten in the way of her ability to regain weight.  I wonder if her poor nutritional status may be a reason for her hair loss.  We will research these medications in case any of them are directly associate with hair loss I am not familiar with them being associate with hair loss although I am familiar with fluconazole causing a reversible hair loss.  For now we will continue all 3 drugs we will consider swapping in [place] if we could procur this for her  Hair loss: Check TSH and T4.  I am not familiar with parathyroidism causing problems with hair loss I wonder if her hair loss could be due to indirectly to the antibiotics due to poor nutritional status or directly due to an antibiotic.  Weight loss: She had lost weight before from her infection but has continued to lose weight or been unable to put it back on.  I am concerned that the antibiotics may be contributing to this.  I am going to refer to nutrition and see if there are ways to try to optimize her her nutritional intake while she is taking her antibiotics in particular  RA takes intermittent prednisone.  Need for COVID-19 vaccination she had COVID-19 infection as documented above but should get vaccinated with of the mRNA COVID-19 vaccines and I encouraged her to get that done as soon as possible.

## 2019-10-24 LAB — PTH, INTACT AND CALCIUM
Calcium: 11.3 mg/dL — ABNORMAL HIGH (ref 8.6–10.4)
PTH: 167 pg/mL — ABNORMAL HIGH (ref 14–64)

## 2019-10-24 LAB — TSH: TSH: 2.88 mIU/L (ref 0.40–4.50)

## 2019-10-24 LAB — T4, FREE: Free T4: 1.9 ng/dL — ABNORMAL HIGH (ref 0.8–1.8)

## 2019-10-25 ENCOUNTER — Encounter: Payer: Self-pay | Admitting: Infectious Disease

## 2019-10-25 DIAGNOSIS — J9601 Acute respiratory failure with hypoxia: Secondary | ICD-10-CM | POA: Diagnosis not present

## 2019-10-25 DIAGNOSIS — J440 Chronic obstructive pulmonary disease with acute lower respiratory infection: Secondary | ICD-10-CM | POA: Diagnosis not present

## 2019-10-25 DIAGNOSIS — A310 Pulmonary mycobacterial infection: Secondary | ICD-10-CM | POA: Diagnosis not present

## 2019-10-25 DIAGNOSIS — M069 Rheumatoid arthritis, unspecified: Secondary | ICD-10-CM | POA: Diagnosis not present

## 2019-10-25 DIAGNOSIS — D649 Anemia, unspecified: Secondary | ICD-10-CM | POA: Diagnosis not present

## 2019-10-25 DIAGNOSIS — N179 Acute kidney failure, unspecified: Secondary | ICD-10-CM | POA: Diagnosis not present

## 2019-10-26 DIAGNOSIS — A310 Pulmonary mycobacterial infection: Secondary | ICD-10-CM | POA: Diagnosis not present

## 2019-10-26 DIAGNOSIS — J9601 Acute respiratory failure with hypoxia: Secondary | ICD-10-CM | POA: Diagnosis not present

## 2019-10-26 DIAGNOSIS — D649 Anemia, unspecified: Secondary | ICD-10-CM | POA: Diagnosis not present

## 2019-10-26 DIAGNOSIS — J440 Chronic obstructive pulmonary disease with acute lower respiratory infection: Secondary | ICD-10-CM | POA: Diagnosis not present

## 2019-10-26 DIAGNOSIS — M069 Rheumatoid arthritis, unspecified: Secondary | ICD-10-CM | POA: Diagnosis not present

## 2019-10-26 DIAGNOSIS — N179 Acute kidney failure, unspecified: Secondary | ICD-10-CM | POA: Diagnosis not present

## 2019-10-30 DIAGNOSIS — J440 Chronic obstructive pulmonary disease with acute lower respiratory infection: Secondary | ICD-10-CM | POA: Diagnosis not present

## 2019-10-30 DIAGNOSIS — N179 Acute kidney failure, unspecified: Secondary | ICD-10-CM | POA: Diagnosis not present

## 2019-10-30 DIAGNOSIS — D649 Anemia, unspecified: Secondary | ICD-10-CM | POA: Diagnosis not present

## 2019-10-30 DIAGNOSIS — I051 Rheumatic mitral insufficiency: Secondary | ICD-10-CM | POA: Diagnosis not present

## 2019-10-30 DIAGNOSIS — Z9181 History of falling: Secondary | ICD-10-CM | POA: Diagnosis not present

## 2019-10-30 DIAGNOSIS — I700 Atherosclerosis of aorta: Secondary | ICD-10-CM | POA: Diagnosis not present

## 2019-10-30 DIAGNOSIS — A310 Pulmonary mycobacterial infection: Secondary | ICD-10-CM | POA: Diagnosis not present

## 2019-10-30 DIAGNOSIS — Z9012 Acquired absence of left breast and nipple: Secondary | ICD-10-CM | POA: Diagnosis not present

## 2019-10-30 DIAGNOSIS — M069 Rheumatoid arthritis, unspecified: Secondary | ICD-10-CM | POA: Diagnosis not present

## 2019-10-30 DIAGNOSIS — E43 Unspecified severe protein-calorie malnutrition: Secondary | ICD-10-CM | POA: Diagnosis not present

## 2019-10-30 DIAGNOSIS — E785 Hyperlipidemia, unspecified: Secondary | ICD-10-CM | POA: Diagnosis not present

## 2019-10-30 DIAGNOSIS — H353130 Nonexudative age-related macular degeneration, bilateral, stage unspecified: Secondary | ICD-10-CM | POA: Diagnosis not present

## 2019-10-30 DIAGNOSIS — J9601 Acute respiratory failure with hypoxia: Secondary | ICD-10-CM | POA: Diagnosis not present

## 2019-10-30 DIAGNOSIS — Z452 Encounter for adjustment and management of vascular access device: Secondary | ICD-10-CM | POA: Diagnosis not present

## 2019-10-30 DIAGNOSIS — I517 Cardiomegaly: Secondary | ICD-10-CM | POA: Diagnosis not present

## 2019-10-30 DIAGNOSIS — E213 Hyperparathyroidism, unspecified: Secondary | ICD-10-CM | POA: Diagnosis not present

## 2019-10-30 DIAGNOSIS — Z87891 Personal history of nicotine dependence: Secondary | ICD-10-CM | POA: Diagnosis not present

## 2019-10-30 DIAGNOSIS — Z853 Personal history of malignant neoplasm of breast: Secondary | ICD-10-CM | POA: Diagnosis not present

## 2019-10-30 DIAGNOSIS — R918 Other nonspecific abnormal finding of lung field: Secondary | ICD-10-CM | POA: Diagnosis not present

## 2019-10-30 DIAGNOSIS — Z9981 Dependence on supplemental oxygen: Secondary | ICD-10-CM | POA: Diagnosis not present

## 2019-10-30 DIAGNOSIS — E559 Vitamin D deficiency, unspecified: Secondary | ICD-10-CM | POA: Diagnosis not present

## 2019-10-30 DIAGNOSIS — M810 Age-related osteoporosis without current pathological fracture: Secondary | ICD-10-CM | POA: Diagnosis not present

## 2019-10-30 DIAGNOSIS — Z7952 Long term (current) use of systemic steroids: Secondary | ICD-10-CM | POA: Diagnosis not present

## 2019-10-30 DIAGNOSIS — Z792 Long term (current) use of antibiotics: Secondary | ICD-10-CM | POA: Diagnosis not present

## 2019-11-01 ENCOUNTER — Encounter: Payer: Self-pay | Admitting: Infectious Disease

## 2019-11-01 DIAGNOSIS — A310 Pulmonary mycobacterial infection: Secondary | ICD-10-CM | POA: Diagnosis not present

## 2019-11-01 DIAGNOSIS — D649 Anemia, unspecified: Secondary | ICD-10-CM | POA: Diagnosis not present

## 2019-11-01 DIAGNOSIS — N179 Acute kidney failure, unspecified: Secondary | ICD-10-CM | POA: Diagnosis not present

## 2019-11-01 DIAGNOSIS — M069 Rheumatoid arthritis, unspecified: Secondary | ICD-10-CM | POA: Diagnosis not present

## 2019-11-01 DIAGNOSIS — J440 Chronic obstructive pulmonary disease with acute lower respiratory infection: Secondary | ICD-10-CM | POA: Diagnosis not present

## 2019-11-01 DIAGNOSIS — J9601 Acute respiratory failure with hypoxia: Secondary | ICD-10-CM | POA: Diagnosis not present

## 2019-11-08 ENCOUNTER — Encounter: Payer: Self-pay | Admitting: Infectious Disease

## 2019-11-08 DIAGNOSIS — A310 Pulmonary mycobacterial infection: Secondary | ICD-10-CM | POA: Diagnosis not present

## 2019-11-08 DIAGNOSIS — J440 Chronic obstructive pulmonary disease with acute lower respiratory infection: Secondary | ICD-10-CM | POA: Diagnosis not present

## 2019-11-08 DIAGNOSIS — D649 Anemia, unspecified: Secondary | ICD-10-CM | POA: Diagnosis not present

## 2019-11-08 DIAGNOSIS — J9601 Acute respiratory failure with hypoxia: Secondary | ICD-10-CM | POA: Diagnosis not present

## 2019-11-08 DIAGNOSIS — M069 Rheumatoid arthritis, unspecified: Secondary | ICD-10-CM | POA: Diagnosis not present

## 2019-11-08 DIAGNOSIS — N179 Acute kidney failure, unspecified: Secondary | ICD-10-CM | POA: Diagnosis not present

## 2019-11-15 ENCOUNTER — Other Ambulatory Visit: Payer: Self-pay

## 2019-11-15 ENCOUNTER — Other Ambulatory Visit: Payer: Self-pay | Admitting: *Deleted

## 2019-11-15 DIAGNOSIS — M069 Rheumatoid arthritis, unspecified: Secondary | ICD-10-CM | POA: Diagnosis not present

## 2019-11-15 DIAGNOSIS — N179 Acute kidney failure, unspecified: Secondary | ICD-10-CM | POA: Diagnosis not present

## 2019-11-15 DIAGNOSIS — J9601 Acute respiratory failure with hypoxia: Secondary | ICD-10-CM | POA: Diagnosis not present

## 2019-11-15 DIAGNOSIS — J440 Chronic obstructive pulmonary disease with acute lower respiratory infection: Secondary | ICD-10-CM | POA: Diagnosis not present

## 2019-11-15 DIAGNOSIS — D649 Anemia, unspecified: Secondary | ICD-10-CM | POA: Diagnosis not present

## 2019-11-15 DIAGNOSIS — A310 Pulmonary mycobacterial infection: Secondary | ICD-10-CM | POA: Diagnosis not present

## 2019-11-15 NOTE — Patient Outreach (Signed)
[facility]) Care Management  11/15/2019  Carolynne Schuchard [birth year] [id number]  Telephone outreach.  Mrs. Ceniceros reports she is still undergoing tx for her mycobacterium lung infection. She says she feels no better or no worse. She has maintained her wt at 82#. She has seen Dr. Tommy Medal in August and he is still trying to investigate the cause of her hair loss. He will change one of her antibiotics and has referred her to a nutritionist.  She does mention today that she doesn't know how much longer she is willing to continue trying to fight this infection. Encouraged her to think for herself, she is in control of her own life. Advised open conversation with her MDs and family. We talked about Palliative Care today if she decides she would just prefer more of a supportive and sxs relief regimen.  She denies any new problems. She appreciates the telephone outreaches and states, I know you are there if I need you."  I will call again in 3 months.  [name]. [name], MSN, [facility] Gerontological Nurse Practitioner [facility] Care Management [phone]

## 2019-11-20 ENCOUNTER — Telehealth: Payer: Self-pay | Admitting: Pharmacy Technician

## 2019-11-20 NOTE — Telephone Encounter (Addendum)
[facility] Patient Advocate Encounter   Received notification from Medicare that prior authorization for Linezolid is required.   PA submitted on 11/20/2019 [place]     [place] Status is APPROVED through 02/19/2020    [facility] Clinic will continue to follow.   Venida Jarvis. Nadara Mustard [place] Patient [facility] for Infectious Disease Phone: [phone] Fax:  [phone]

## 2019-11-22 ENCOUNTER — Telehealth: Payer: Self-pay

## 2019-11-22 ENCOUNTER — Encounter: Payer: Self-pay | Admitting: Infectious Disease

## 2019-11-22 DIAGNOSIS — D649 Anemia, unspecified: Secondary | ICD-10-CM | POA: Diagnosis not present

## 2019-11-22 DIAGNOSIS — J9601 Acute respiratory failure with hypoxia: Secondary | ICD-10-CM | POA: Diagnosis not present

## 2019-11-22 DIAGNOSIS — J440 Chronic obstructive pulmonary disease with acute lower respiratory infection: Secondary | ICD-10-CM | POA: Diagnosis not present

## 2019-11-22 DIAGNOSIS — M069 Rheumatoid arthritis, unspecified: Secondary | ICD-10-CM | POA: Diagnosis not present

## 2019-11-22 DIAGNOSIS — N179 Acute kidney failure, unspecified: Secondary | ICD-10-CM | POA: Diagnosis not present

## 2019-11-22 DIAGNOSIS — A310 Pulmonary mycobacterial infection: Secondary | ICD-10-CM | POA: Diagnosis not present

## 2019-11-22 NOTE — Telephone Encounter (Signed)
COVID-19 Pre-Screening Questions:11/22/19  Do you currently have a fever (>100 F), chills or unexplained body aches? NO  Are you currently experiencing new cough, shortness of breath, sore throat, runny nose? NO  .  Have you been in contact with someone that is currently pending confirmation of Covid19 testing or has been confirmed to have the [place] virus? NO  **If the patient answers NO to ALL questions -  advise the patient to please call the clinic before coming to the office should any symptoms develop.

## 2019-11-23 ENCOUNTER — Ambulatory Visit (INDEPENDENT_AMBULATORY_CARE_PROVIDER_SITE_OTHER): Payer: Medicare Other | Admitting: Infectious Disease

## 2019-11-23 ENCOUNTER — Other Ambulatory Visit: Payer: Self-pay

## 2019-11-23 ENCOUNTER — Ambulatory Visit: Payer: Medicare Other

## 2019-11-23 ENCOUNTER — Encounter: Payer: Self-pay | Admitting: Infectious Disease

## 2019-11-23 ENCOUNTER — Ambulatory Visit: Payer: Medicare Other | Admitting: Infectious Disease

## 2019-11-23 VITALS — BP 161/88 | HR 92 | Temp 97.5°F | Ht 61.0 in | Wt 80.0 lb

## 2019-11-23 DIAGNOSIS — Z7189 Other specified counseling: Secondary | ICD-10-CM | POA: Diagnosis not present

## 2019-11-23 DIAGNOSIS — L659 Nonscarring hair loss, unspecified: Secondary | ICD-10-CM

## 2019-11-23 DIAGNOSIS — A310 Pulmonary mycobacterial infection: Secondary | ICD-10-CM

## 2019-11-23 DIAGNOSIS — R630 Anorexia: Secondary | ICD-10-CM

## 2019-11-23 DIAGNOSIS — U071 COVID-19: Secondary | ICD-10-CM

## 2019-11-23 DIAGNOSIS — Z7185 Encounter for immunization safety counseling: Secondary | ICD-10-CM

## 2019-11-23 DIAGNOSIS — Z23 Encounter for immunization: Secondary | ICD-10-CM

## 2019-11-23 MED ORDER — LIDOCAINE-PRILOCAINE 2.5-2.5 % EX CREA: 1.0000 "application " | TOPICAL_CREAM | CUTANEOUS | 11 refills | Status: DC | PRN

## 2019-11-23 NOTE — Progress Notes (Signed)
Subjective:   Chief complaint: Continued trouble  poor appetite   Patient ID: Olivia Hooper, female    DOB: 07-Jul-1940, 79 y.o.   MRN: [id number]  HPI  79 year-old Caucasian lady with history of tobacco use chronic obstructive pulmonary disease severe rheumatoid arthritis, left breast cancer who has been on immunosuppressive therapy. She apparently had a 1 month history of hemoptysis. X-ray had shown a right upper lobe lung mass CT had shown 6.5 x 5.2 x 7.47 manner cavitary mass in the right upper lobe. There are multiple small lung nodules as well. There is also a small anterior mediastinal mass.  Patient underwent bronchoscopy with Dr. Tamala Julian with cytology is AFB fungal stains and cultures. 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 In the interval she had a PET CT scan that showed the cavitary mass which was very hypermetabolic patchy tree-in-bud opacities in both lungs there were hypermetabolic and a 3.9 1.6 cm anterior mediastinal soft tissue mass that was hypermetabolic.  There was anxiety about patient potentially having a thymoma but at present she was felt to be a high risk surgical patient when seen by Dr. Salomon Fick with cardiothoracic surgery.  I also agree with him that this mediastinal mass could be due to the mycobacterial abscessus infection.  These organisms are not notoriously difficult to treat still to have some anxiety the patient might need some surgical intervention to gain control of her chest pathology.  Her mycobacterium abscessus was =resistant to a lot of antibiotics but was sensitive to cefoxitin linezolid, amikacin andtigecycline.  She had previously lost 10 pounds of weight over the last half year.  She has stopped smoking   We crafted a regimen to treat her Mycobacterium abscessus with once daily clofazimine twice daily Zyvox and IV cefoxitin via continuous infusion. When I saw her in late February she was5  weeks into treatment. She has been feeling progressively more fatigued and sleeping excessively according to both her and her son who accompanied her.  I noticed that she had dropped her hemoglobin down into the range of 7.4 after being 10. I was concerned that Zyvox might have been causing some myelosuppression though her platelets had not dropped and we dropped her Zyvox dose to once daily. In the interim she experienced worsening fatigue and anemia and was admitted hospital. Her hemoglobin had dropped below 7 into the 6 range. She was found to have COVID-19 and was treated with steroids and remdesivir her antibiotics were stopped altogether. She was seen by my partner Dr. Linus Salmons and and he had considered an  alternate regimen was considered including omadacycline.  Unfortunately even when  omadacycline was approved her cost for drugs was still 7000 due to the nature of her insurance.  We challenged her with low-dose Zyvox at 600 mg a day along with continued clofazimine and IV cefoxitin.  She has felt pretty poorly on this regimen and stop the meds altogether.  She wanted to get a second opinion at [facility] with Dr. Brigitte Pulse which she has now done.  In the interval she had resumed all medications.  Dr. Lorenda Cahill had recommended that she stop all medications for at least 4 days and I think she could even stop for longer.  He then recommends reintroducing these medications stepwise with introducing clofazimine first for 4 days followed by linezolid 600 mg daily for 4 days then followed by IV cefoxitin.  If 1 or more of these medications is difficult for her to  tolerate he suggested looking at some other alternative regimens if we can get them covered by her insurance including inhaled amikacin and bedaliquine along with having M abscessus sent from lab for bedaliquine sensitivities.  We proceeded with stepwise reintroduction of her antibiotics.  She is able to tolerate them but she does  continue to suffer from a poor appetite.  Along with this and along with weight loss that is persisted she has begun to lose her hair and much larger amounts than before.  She first noticed herself losing hair this past winter which may or may not of corresponded with when she was taking to biotics.  Her cough is improved dramatically since being on her current 3 drug regimen for Mycobacterium abscessus  She continues to have trouble with appetite and regaining weight that she lost. Medicare would not approve of nutrition consult which makes no sense to me.    Past Medical History:  Diagnosis Date  . Anemia 05/03/2019  . Anorexia 10/23/2019  . Breast cancer ([place])    on left  . Carpal tunnel syndrome   . COPD (chronic obstructive pulmonary disease) ([place])   . Drug-induced anemia 05/03/2019  . Dyspnea   . Hair loss 10/23/2019  . Hypercalcemia   . Hyperlipidemia   . Hyperparathyroidism ([place])   . Hyperparathyroidism ([place]) 10/23/2019  . Osteoporosis   . Pneumonia   . Pulmonary mycobacterial infection ([place]) 03/14/2019  . RA (rheumatoid arthritis) ([place])   . Sleeping excessive 05/03/2019  . Vaccine counseling 10/23/2019  . Vertigo   . Vitamin D deficiency     Past Surgical History:  Procedure Laterality Date  . CARPAL TUNNEL RELEASE    . CATARACT EXTRACTION    . IR IMAGING GUIDED PORT INSERTION  04/05/2019  . MASTECTOMY Left   . VESICOVAGINAL FISTULA CLOSURE W/ TAH    . VIDEO BRONCHOSCOPY WITH ENDOBRONCHIAL ULTRASOUND N/A 01/18/2019   Procedure: VIDEO BRONCHOSCOPY WITH ENDOBRONCHIAL ULTRASOUND;  Surgeon: Candee Furbish, MD;  Location: [facility] OR;  Service: Thoracic;  Laterality: N/A;    Family History  Problem Relation Age of Onset  . Heart disease Father   . Bone cancer Father   . Emphysema Father        never smoker  . Heart disease Mother   . Colon cancer Mother   . Melanoma Sister   . Kidney cancer Brother       Social History   Socioeconomic History  . Marital status:  Widowed    Spouse name: Not on file  . Number of children: 4  . Years of education: 12+  . Highest education level: Associate degree: occupational, Hotel manager, or vocational program  Occupational History  . Occupation: Retired   Tobacco Use  . Smoking status: Heavy Tobacco Smoker    Packs/day: 0.50    Years: 62.00    Pack years: 31.00    Types: Cigarettes    Last attempt to quit: 01/12/2019    Years since quitting: 0.8  . Smokeless tobacco: Never Used  Vaping Use  . Vaping Use: Never used  Substance and Sexual Activity  . Alcohol use: Yes    Alcohol/week: 0.0 standard drinks    Comment: occ  . Drug use: No  . Sexual activity: Not on file  Other Topics Concern  . Not on file  Social History Narrative   Lives alone, 4 children, independent.   Social Determinants of Health   Financial Resource Strain: Low Risk   . Difficulty of Paying Living  Expenses: Not hard at all  Food Insecurity: No Food Insecurity  . Worried About Charity fundraiser in the Last Year: Never true  . Ran Out of Food in the Last Year: Never true  Transportation Needs: No Transportation Needs  . Lack of Transportation (Medical): No  . Lack of Transportation (Non-Medical): No  Physical Activity: Inactive  . Days of Exercise per Week: 0 days  . Minutes of Exercise per Session: 0 min  Stress: No Stress Concern Present  . Feeling of Stress : Only a little  Social Connections: Moderately Integrated  . Frequency of Communication with Friends and Family: More than three times a week  . Frequency of Social Gatherings with Friends and Family: More than three times a week  . Attends Religious Services: More than 4 times per year  . Active Member of Clubs or Organizations: Yes  . Attends Archivist Meetings: More than 4 times per year  . Marital Status: Widowed    Allergies  Allergen Reactions  . Buprenorphine Hcl Nausea And Vomiting  . Morphine And Related Nausea And Vomiting           Current Outpatient Medications:  .  acetaminophen (TYLENOL) 500 MG tablet, Take 500 mg by mouth every 8 (eight) hours as needed for mild pain. , Disp: , Rfl:  .  albuterol (VENTOLIN HFA) 108 (90 Base) MCG/ACT inhaler, Inhale 2 puffs into the lungs every 6 (six) hours as needed for wheezing or shortness of breath. , Disp: , Rfl:  .  AMBULATORY NON FORMULARY MEDICATION, Take 100 mg by mouth daily. Medication Name: clofazimine for mycobacterium abscessus pulmonary infection, Disp: 100 capsule, Rfl: 2 .  ascorbic acid (VITAMIN C) 500 MG tablet, Take 1 tablet (500 mg total) by mouth 2 (two) times daily., Disp: 30 tablet, Rfl: 0 .  cefOXitin 8 g in dextrose 5 % 50 mL, Inject 8 g into the vein continuous., Disp: 8 g, Rfl: 11 .  diphenoxylate-atropine (LOMOTIL) 2.5-0.025 MG/5ML liquid, Take 5 mLs by mouth 4 (four) times daily as needed for diarrhea or loose stools., Disp: 60 mL, Rfl: 0 .  fluconazole (DIFLUCAN) 100 MG tablet, Take 2 tablets (200 mg total) by mouth daily., Disp: 20 tablet, Rfl: 0 .  guaiFENesin-dextromethorphan (ROBITUSSIN DM) 100-10 MG/5ML syrup, Take 5 mLs by mouth every 4 (four) hours as needed for cough., Disp: 118 mL, Rfl: 0 .  Ipratropium-Albuterol (COMBIVENT) 20-100 MCG/ACT AERS respimat, Inhale 2 puffs into the lungs every 6 (six) hours., Disp: 4 g, Rfl: 0 .  lidocaine-prilocaine (EMLA) cream, Apply 1 application topically as needed., Disp: 30 g, Rfl: 11 .  linezolid (ZYVOX) 600 MG tablet, Take 1 tablet (600 mg total) by mouth daily., Disp: 30 tablet, Rfl: 11 .  mometasone-formoterol (DULERA) 100-5 MCG/ACT AERO, Inhale 2 puffs into the lungs 2 (two) times daily as needed for wheezing or shortness of breath. , Disp: , Rfl:  .  mupirocin ointment (BACTROBAN) 2 %, Apply 1 application topically 2 (two) times daily., Disp: 30 g, Rfl: 2 .  traMADol (ULTRAM) 50 MG tablet, Take 50 mg by mouth 2 (two) times daily as needed for moderate pain. , Disp: , Rfl:  .  zinc sulfate 220 (50 Zn)  MG capsule, Take 1 capsule (220 mg total) by mouth daily., Disp:  , Rfl:  .  Polyethyl Glycol-Propyl Glycol (SYSTANE OP), Place 1 drop into both eyes daily as needed (dry eyes). (Patient not taking: Reported on 10/23/2019), Disp: , Rfl:  .  predniSONE (DELTASONE) 5 MG tablet, Take 5-10 mg by mouth daily as needed., Disp: , Rfl:  .  torsemide (DEMADEX) 5 MG tablet, Take 5 mg by mouth daily. (Patient not taking: Reported on 11/23/2019), Disp: , Rfl:     Review of Systems  Constitutional: Positive for appetite change and fatigue. Negative for chills, diaphoresis, fever and unexpected weight change.  HENT: Negative for congestion, rhinorrhea, sinus pressure, sneezing, sore throat and trouble swallowing.   Eyes: Negative for photophobia and visual disturbance.  Respiratory: Negative for cough, chest tightness, shortness of breath, wheezing and stridor.   Cardiovascular: Negative for chest pain, palpitations and leg swelling.  Gastrointestinal: Negative for abdominal distention, abdominal pain, anal bleeding, blood in stool, constipation, diarrhea and vomiting.  Genitourinary: Negative for difficulty urinating, dysuria, flank pain and hematuria.  Musculoskeletal: Negative for arthralgias, back pain, gait problem, joint swelling and myalgias.  Skin: Negative for color change, pallor and rash.  Neurological: Negative for dizziness, tremors, weakness and light-headedness.  Hematological: Negative for adenopathy. Does not bruise/bleed easily.  Psychiatric/Behavioral: Negative for agitation, behavioral problems, confusion, decreased concentration and sleep disturbance.       Objective:   Physical Exam Constitutional:      General: She is not in acute distress.    Appearance: She is well-developed and underweight.  HENT:     Head: Normocephalic and atraumatic.     Right Ear: Hearing and external ear normal.     Left Ear: Hearing and external ear normal.     Nose: No nasal deformity or rhinorrhea.   Eyes:     General: No scleral icterus.    Conjunctiva/sclera: Conjunctivae normal.     Right eye: Right conjunctiva is not injected.     Left eye: Left conjunctiva is not injected.  Neck:     Vascular: No JVD.  Cardiovascular:     Rate and Rhythm: Normal rate and regular rhythm.     Heart sounds: S1 normal and S2 normal. No murmur heard.  No friction rub. No gallop.   Pulmonary:     Effort: Prolonged expiration present. No respiratory distress.     Breath sounds: Normal breath sounds. No wheezing.  Abdominal:     General: There is no distension.     Palpations: Abdomen is soft.  Musculoskeletal:        General: Normal range of motion.     Right shoulder: Normal.     Left shoulder: Normal.     Cervical back: Normal range of motion and neck supple.     Right hip: Normal.     Left hip: Normal.     Right knee: Normal.     Left knee: Normal.  Lymphadenopathy:     Head:     Right side of head: No submandibular, preauricular or posterior auricular adenopathy.     Left side of head: No submandibular, preauricular or posterior auricular adenopathy.     Cervical: No cervical adenopathy.     Right cervical: No superficial or deep cervical adenopathy.    Left cervical: No superficial or deep cervical adenopathy.  Skin:    General: Skin is warm and dry.     Coloration: Skin is not pale.     Findings: No abrasion, bruising, ecchymosis, erythema, lesion or rash.     Nails: There is no clubbing.  Neurological:     General: No focal deficit present.     Mental Status: She is alert and oriented to person, place, and time.  Sensory: No sensory deficit.     Coordination: Coordination normal.     Gait: Gait normal.  Psychiatric:        Attention and Perception: She is attentive.        Mood and Affect: Mood normal.        Speech: Speech normal.        Behavior: Behavior normal. Behavior is cooperative.        Thought Content: Thought content normal.        Judgment: Judgment normal.            Assessment & Plan:  Mycocterium abscessus lung infection:  Icontinue all 3 drugs we will consider swapping in Betaquiline if we could procur this for her  Hair loss: likely due to poor nutritional status but slowing down now?  Weight loss: She had lost weight before from her infection but has continued to lose weight or been unable to put it back on.  I am concerned that the antibiotics may be contributing to this.  r  RA takes intermittent prednisone.  Need for COVID-19 vaccination she had COVID-19 infection as documented above but should get vaccinated with of the mRNA   And after counsellng she consented to [place] no 1 today.

## 2019-11-23 NOTE — Progress Notes (Signed)
   Covid-19 Vaccination Clinic  Name:  Olivia Hooper    MRN: [id number] DOB: 17-Jan-1941  11/23/2019  [name] was observed post Covid-19 immunization for 15 minutes without incident. She was provided with Vaccine Information Sheet and instruction to access the V-Safe system.   Ms. Sand was instructed to call 911 with any severe reactions post vaccine: Marland Kitchen Difficulty breathing  . Swelling of face and throat  . A fast heartbeat  . A bad rash all over body  . Dizziness and weakness   Immunizations Administered    Name Date Dose VIS Date Route   Pfizer COVID-19 Vaccine 11/23/2019 12:02 PM 0.3 mL 05/03/2018 Intramuscular   Manufacturer: [place]   Lot: [id number]   [place]: [id number]     Landis Gandy, RN

## 2019-11-24 DIAGNOSIS — E785 Hyperlipidemia, unspecified: Secondary | ICD-10-CM | POA: Diagnosis not present

## 2019-11-24 DIAGNOSIS — L659 Nonscarring hair loss, unspecified: Secondary | ICD-10-CM | POA: Diagnosis not present

## 2019-11-24 DIAGNOSIS — J449 Chronic obstructive pulmonary disease, unspecified: Secondary | ICD-10-CM | POA: Diagnosis not present

## 2019-11-24 DIAGNOSIS — R918 Other nonspecific abnormal finding of lung field: Secondary | ICD-10-CM | POA: Diagnosis not present

## 2019-11-24 DIAGNOSIS — M150 Primary generalized (osteo)arthritis: Secondary | ICD-10-CM | POA: Diagnosis not present

## 2019-11-24 DIAGNOSIS — Z720 Tobacco use: Secondary | ICD-10-CM | POA: Diagnosis not present

## 2019-11-24 DIAGNOSIS — R002 Palpitations: Secondary | ICD-10-CM | POA: Diagnosis not present

## 2019-11-24 DIAGNOSIS — K635 Polyp of colon: Secondary | ICD-10-CM | POA: Diagnosis not present

## 2019-11-24 DIAGNOSIS — M069 Rheumatoid arthritis, unspecified: Secondary | ICD-10-CM | POA: Diagnosis not present

## 2019-11-24 DIAGNOSIS — M810 Age-related osteoporosis without current pathological fracture: Secondary | ICD-10-CM | POA: Diagnosis not present

## 2019-11-27 DIAGNOSIS — J440 Chronic obstructive pulmonary disease with acute lower respiratory infection: Secondary | ICD-10-CM | POA: Diagnosis not present

## 2019-11-27 DIAGNOSIS — M069 Rheumatoid arthritis, unspecified: Secondary | ICD-10-CM | POA: Diagnosis not present

## 2019-11-27 DIAGNOSIS — D649 Anemia, unspecified: Secondary | ICD-10-CM | POA: Diagnosis not present

## 2019-11-27 DIAGNOSIS — J9601 Acute respiratory failure with hypoxia: Secondary | ICD-10-CM | POA: Diagnosis not present

## 2019-11-27 DIAGNOSIS — A310 Pulmonary mycobacterial infection: Secondary | ICD-10-CM | POA: Diagnosis not present

## 2019-11-27 DIAGNOSIS — N179 Acute kidney failure, unspecified: Secondary | ICD-10-CM | POA: Diagnosis not present

## 2019-11-28 ENCOUNTER — Telehealth: Payer: Self-pay | Admitting: *Deleted

## 2019-11-28 NOTE — Telephone Encounter (Signed)
She can take a break whenever they wish. They did not voice this during visit but she can do that now or whenver she wishes. Her line will need to be maintained

## 2019-11-28 NOTE — Telephone Encounter (Signed)
Patient's daughter called with questions.  She reports Briseidy seems a little more confused, would like to know if her hemoglobin has dropped (stable at 11).  She is eating and drinking well, focusing on that.  No symptoms of UTI. Arienna would also like to consider a break in therapy, and would like to know what an appropriate length of break would be.  She is hoping to feel well during the Thanksgiving-Christmas time frame, would like to be able to put weight back on too. They are available to follow up via phone visit if this is appropriate.  Please advise. Landis Gandy, RN

## 2019-11-29 ENCOUNTER — Encounter: Payer: Self-pay | Admitting: Infectious Disease

## 2019-11-29 DIAGNOSIS — R918 Other nonspecific abnormal finding of lung field: Secondary | ICD-10-CM | POA: Diagnosis not present

## 2019-11-29 DIAGNOSIS — I700 Atherosclerosis of aorta: Secondary | ICD-10-CM | POA: Diagnosis not present

## 2019-11-29 DIAGNOSIS — Z87891 Personal history of nicotine dependence: Secondary | ICD-10-CM | POA: Diagnosis not present

## 2019-11-29 DIAGNOSIS — E559 Vitamin D deficiency, unspecified: Secondary | ICD-10-CM | POA: Diagnosis not present

## 2019-11-29 DIAGNOSIS — Z452 Encounter for adjustment and management of vascular access device: Secondary | ICD-10-CM | POA: Diagnosis not present

## 2019-11-29 DIAGNOSIS — Z853 Personal history of malignant neoplasm of breast: Secondary | ICD-10-CM | POA: Diagnosis not present

## 2019-11-29 DIAGNOSIS — E213 Hyperparathyroidism, unspecified: Secondary | ICD-10-CM | POA: Diagnosis not present

## 2019-11-29 DIAGNOSIS — J440 Chronic obstructive pulmonary disease with acute lower respiratory infection: Secondary | ICD-10-CM | POA: Diagnosis not present

## 2019-11-29 DIAGNOSIS — Z9981 Dependence on supplemental oxygen: Secondary | ICD-10-CM | POA: Diagnosis not present

## 2019-11-29 DIAGNOSIS — Z9012 Acquired absence of left breast and nipple: Secondary | ICD-10-CM | POA: Diagnosis not present

## 2019-11-29 DIAGNOSIS — Z792 Long term (current) use of antibiotics: Secondary | ICD-10-CM | POA: Diagnosis not present

## 2019-11-29 DIAGNOSIS — E43 Unspecified severe protein-calorie malnutrition: Secondary | ICD-10-CM | POA: Diagnosis not present

## 2019-11-29 DIAGNOSIS — D649 Anemia, unspecified: Secondary | ICD-10-CM | POA: Diagnosis not present

## 2019-11-29 DIAGNOSIS — M069 Rheumatoid arthritis, unspecified: Secondary | ICD-10-CM | POA: Diagnosis not present

## 2019-11-29 DIAGNOSIS — M810 Age-related osteoporosis without current pathological fracture: Secondary | ICD-10-CM | POA: Diagnosis not present

## 2019-11-29 DIAGNOSIS — J9601 Acute respiratory failure with hypoxia: Secondary | ICD-10-CM | POA: Diagnosis not present

## 2019-11-29 DIAGNOSIS — Z9181 History of falling: Secondary | ICD-10-CM | POA: Diagnosis not present

## 2019-11-29 DIAGNOSIS — H353130 Nonexudative age-related macular degeneration, bilateral, stage unspecified: Secondary | ICD-10-CM | POA: Diagnosis not present

## 2019-11-29 DIAGNOSIS — I517 Cardiomegaly: Secondary | ICD-10-CM | POA: Diagnosis not present

## 2019-11-29 DIAGNOSIS — A310 Pulmonary mycobacterial infection: Secondary | ICD-10-CM | POA: Diagnosis not present

## 2019-11-29 DIAGNOSIS — E785 Hyperlipidemia, unspecified: Secondary | ICD-10-CM | POA: Diagnosis not present

## 2019-11-29 DIAGNOSIS — I051 Rheumatic mitral insufficiency: Secondary | ICD-10-CM | POA: Diagnosis not present

## 2019-11-29 DIAGNOSIS — Z7952 Long term (current) use of systemic steroids: Secondary | ICD-10-CM | POA: Diagnosis not present

## 2019-11-29 NOTE — Telephone Encounter (Signed)
It can be weeks, months. If she likes. She does have a tunnelled line that will need to be maintained if she wants to go back on her therapy

## 2019-11-29 NOTE — Telephone Encounter (Signed)
How long of a break?

## 2019-12-05 NOTE — Telephone Encounter (Signed)
Relayed to [place]. They will let us know when she wants the break.

## 2019-12-06 ENCOUNTER — Encounter: Payer: Self-pay | Admitting: Infectious Disease

## 2019-12-06 DIAGNOSIS — J440 Chronic obstructive pulmonary disease with acute lower respiratory infection: Secondary | ICD-10-CM | POA: Diagnosis not present

## 2019-12-06 DIAGNOSIS — D649 Anemia, unspecified: Secondary | ICD-10-CM | POA: Diagnosis not present

## 2019-12-06 DIAGNOSIS — M069 Rheumatoid arthritis, unspecified: Secondary | ICD-10-CM | POA: Diagnosis not present

## 2019-12-06 DIAGNOSIS — Z452 Encounter for adjustment and management of vascular access device: Secondary | ICD-10-CM | POA: Diagnosis not present

## 2019-12-06 DIAGNOSIS — A310 Pulmonary mycobacterial infection: Secondary | ICD-10-CM | POA: Diagnosis not present

## 2019-12-06 DIAGNOSIS — J9601 Acute respiratory failure with hypoxia: Secondary | ICD-10-CM | POA: Diagnosis not present

## 2019-12-11 ENCOUNTER — Telehealth: Payer: Self-pay | Admitting: *Deleted

## 2019-12-11 NOTE — Telephone Encounter (Signed)
Patient called, decided she did want a break from antibiotics. She would like to take a break from Thursday 10/7 until after the new year starts.  RN contacted [facility], relayed the message to [facility].  Patient will need portacath deaccessed and with monthly flushes. Stanton Kidney will relay to [place] and follow up if needed. Will need a new order to restart meds once she is ready. Landis Gandy, RN

## 2019-12-12 NOTE — Telephone Encounter (Signed)
Sure, Will let Dr Tommy Medal put the new orders when she is ready. I am covering his inbox currently.

## 2019-12-13 ENCOUNTER — Telehealth: Payer: Self-pay

## 2019-12-13 DIAGNOSIS — J9601 Acute respiratory failure with hypoxia: Secondary | ICD-10-CM | POA: Diagnosis not present

## 2019-12-13 DIAGNOSIS — J440 Chronic obstructive pulmonary disease with acute lower respiratory infection: Secondary | ICD-10-CM | POA: Diagnosis not present

## 2019-12-13 DIAGNOSIS — M069 Rheumatoid arthritis, unspecified: Secondary | ICD-10-CM | POA: Diagnosis not present

## 2019-12-13 DIAGNOSIS — D649 Anemia, unspecified: Secondary | ICD-10-CM | POA: Diagnosis not present

## 2019-12-13 DIAGNOSIS — A310 Pulmonary mycobacterial infection: Secondary | ICD-10-CM | POA: Diagnosis not present

## 2019-12-13 DIAGNOSIS — Z452 Encounter for adjustment and management of vascular access device: Secondary | ICD-10-CM | POA: Diagnosis not present

## 2019-12-13 NOTE — Telephone Encounter (Signed)
Patient called to confirm appointment for 12/15/19 at 11am for covid vaccine.  Olivia Hooper

## 2019-12-15 ENCOUNTER — Other Ambulatory Visit: Payer: Self-pay

## 2019-12-15 ENCOUNTER — Ambulatory Visit (INDEPENDENT_AMBULATORY_CARE_PROVIDER_SITE_OTHER): Payer: Medicare Other

## 2019-12-15 DIAGNOSIS — Z23 Encounter for immunization: Secondary | ICD-10-CM | POA: Diagnosis not present

## 2019-12-15 NOTE — Progress Notes (Signed)
   Covid-19 Vaccination Clinic  Name:  Olivia Hooper    MRN: [id number] DOB: May 01, 1940  12/15/2019  Olivia Hooper was observed post Covid-19 immunization for 15 minutes without incident. She was provided with Vaccine Information Sheet and instruction to access the V-Safe system.   Olivia Hooper was instructed to call 911 with any severe reactions post vaccine: Marland Kitchen Difficulty breathing  . Swelling of face and throat  . A fast heartbeat  . A bad rash all over body  . Dizziness and weakness   Immunizations Administered    Name Date Dose VIS Date Route   Pfizer COVID-19 Vaccine 12/15/2019 11:03 AM 0.3 mL 05/03/2018 Intramuscular   Manufacturer: [place]   Lot: [id number]   [place]: [place]

## 2019-12-28 ENCOUNTER — Ambulatory Visit: Payer: Medicare Other | Admitting: Podiatry

## 2019-12-28 ENCOUNTER — Other Ambulatory Visit: Payer: Self-pay

## 2019-12-28 ENCOUNTER — Ambulatory Visit (INDEPENDENT_AMBULATORY_CARE_PROVIDER_SITE_OTHER): Payer: Medicare Other | Admitting: Podiatry

## 2019-12-28 DIAGNOSIS — M79674 Pain in right toe(s): Secondary | ICD-10-CM | POA: Diagnosis not present

## 2019-12-28 DIAGNOSIS — B351 Tinea unguium: Secondary | ICD-10-CM | POA: Diagnosis not present

## 2019-12-28 DIAGNOSIS — M79675 Pain in left toe(s): Secondary | ICD-10-CM

## 2019-12-28 DIAGNOSIS — L84 Corns and callosities: Secondary | ICD-10-CM

## 2019-12-29 DIAGNOSIS — Z452 Encounter for adjustment and management of vascular access device: Secondary | ICD-10-CM | POA: Diagnosis not present

## 2019-12-30 NOTE — Progress Notes (Signed)
Subjective: 79 y.o. returns the office today for painful, elongated, thickened toenails which she cannot trim herself and for a thick callus on the bottom of her left big toe. Denies any redness or drainage around the nails. Denies any acute changes since last appointment and no new complaints today. Denies any systemic complaints such as fevers, chills, nausea, vomiting.   PCP: [name], MD  Objective: AAO 3, NAD DP/PT pulses palpable, CRT less than 3 seconds Nails hypertrophic, dystrophic, elongated, brittle, discolored 10. There is tenderness overlying the nails 1-5 bilaterally. There is no surrounding erythema or drainage along the nail sites. Hyperkeratotic lesion plantar left hallux without any underlying ulceration, drainage or signs of infection. There was dried blood within the callus but no skin breakdown.   No open lesions or pre-ulcerative lesions are identified. No other areas of tenderness bilateral lower extremities. No overlying edema, erythema, increased warmth. No pain with calf compression, swelling, warmth, erythema.  Assessment: Patient presents with symptomatic onychomycosis, hyperkeratotic lesion  Plan: -Treatment options including alternatives, risks, complications were discussed -Nails sharply debrided 10 without complication/bleeding. -Hyperkeratotic lesion sharply debrided x 1 without any complications or bleeding. -Discussed daily foot inspection. If there are any changes, to call the office immediately.  -Follow-up in 3 months or sooner if any problems are to arise. In the meantime, encouraged to call the office with any questions, concerns, changes symptoms.  Celesta Gentile, DPM

## 2020-01-04 ENCOUNTER — Other Ambulatory Visit: Payer: Self-pay

## 2020-01-04 ENCOUNTER — Inpatient Hospital Stay (HOSPITAL_COMMUNITY): Payer: Medicare Other | Admitting: Anesthesiology

## 2020-01-04 ENCOUNTER — Inpatient Hospital Stay (HOSPITAL_COMMUNITY): Payer: Medicare Other

## 2020-01-04 ENCOUNTER — Inpatient Hospital Stay (HOSPITAL_COMMUNITY)
Admission: EM | Admit: 2020-01-04 | Discharge: 2020-01-12 | DRG: 853 | Disposition: A | Discharge status: Skilled Nursing Facility | Payer: Medicare Other | Attending: Internal Medicine | Admitting: Internal Medicine

## 2020-01-04 ENCOUNTER — Encounter (HOSPITAL_COMMUNITY): Payer: Self-pay

## 2020-01-04 ENCOUNTER — Ambulatory Visit: Payer: Medicare Other | Admitting: Surgery

## 2020-01-04 ENCOUNTER — Emergency Department (HOSPITAL_COMMUNITY): Payer: Medicare Other

## 2020-01-04 DIAGNOSIS — Z9981 Dependence on supplemental oxygen: Secondary | ICD-10-CM

## 2020-01-04 DIAGNOSIS — J156 Pneumonia due to other aerobic Gram-negative bacteria: Secondary | ICD-10-CM | POA: Diagnosis not present

## 2020-01-04 DIAGNOSIS — J181 Lobar pneumonia, unspecified organism: Secondary | ICD-10-CM

## 2020-01-04 DIAGNOSIS — Z853 Personal history of malignant neoplasm of breast: Secondary | ICD-10-CM | POA: Diagnosis not present

## 2020-01-04 DIAGNOSIS — M05731 Rheumatoid arthritis with rheumatoid factor of right wrist without organ or systems involvement: Secondary | ICD-10-CM

## 2020-01-04 DIAGNOSIS — S7222XA Displaced subtrochanteric fracture of left femur, initial encounter for closed fracture: Secondary | ICD-10-CM | POA: Diagnosis not present

## 2020-01-04 DIAGNOSIS — M069 Rheumatoid arthritis, unspecified: Secondary | ICD-10-CM | POA: Diagnosis present

## 2020-01-04 DIAGNOSIS — S72002A Fracture of unspecified part of neck of left femur, initial encounter for closed fracture: Secondary | ICD-10-CM | POA: Diagnosis present

## 2020-01-04 DIAGNOSIS — A319 Mycobacterial infection, unspecified: Secondary | ICD-10-CM | POA: Diagnosis present

## 2020-01-04 DIAGNOSIS — E872 Acidosis: Secondary | ICD-10-CM | POA: Diagnosis present

## 2020-01-04 DIAGNOSIS — Z885 Allergy status to narcotic agent status: Secondary | ICD-10-CM | POA: Diagnosis not present

## 2020-01-04 DIAGNOSIS — R1310 Dysphagia, unspecified: Secondary | ICD-10-CM | POA: Diagnosis not present

## 2020-01-04 DIAGNOSIS — R41841 Cognitive communication deficit: Secondary | ICD-10-CM | POA: Diagnosis not present

## 2020-01-04 DIAGNOSIS — J90 Pleural effusion, not elsewhere classified: Secondary | ICD-10-CM | POA: Diagnosis not present

## 2020-01-04 DIAGNOSIS — J9601 Acute respiratory failure with hypoxia: Secondary | ICD-10-CM | POA: Diagnosis not present

## 2020-01-04 DIAGNOSIS — J9621 Acute and chronic respiratory failure with hypoxia: Secondary | ICD-10-CM | POA: Diagnosis present

## 2020-01-04 DIAGNOSIS — J440 Chronic obstructive pulmonary disease with acute lower respiratory infection: Secondary | ICD-10-CM | POA: Diagnosis present

## 2020-01-04 DIAGNOSIS — R0902 Hypoxemia: Secondary | ICD-10-CM

## 2020-01-04 DIAGNOSIS — S7292XA Unspecified fracture of left femur, initial encounter for closed fracture: Secondary | ICD-10-CM

## 2020-01-04 DIAGNOSIS — M6281 Muscle weakness (generalized): Secondary | ICD-10-CM | POA: Diagnosis not present

## 2020-01-04 DIAGNOSIS — J9811 Atelectasis: Secondary | ICD-10-CM | POA: Diagnosis present

## 2020-01-04 DIAGNOSIS — Y92009 Unspecified place in unspecified non-institutional (private) residence as the place of occurrence of the external cause: Secondary | ICD-10-CM | POA: Diagnosis not present

## 2020-01-04 DIAGNOSIS — Z9012 Acquired absence of left breast and nipple: Secondary | ICD-10-CM | POA: Diagnosis not present

## 2020-01-04 DIAGNOSIS — B9689 Other specified bacterial agents as the cause of diseases classified elsewhere: Secondary | ICD-10-CM | POA: Diagnosis not present

## 2020-01-04 DIAGNOSIS — M05732 Rheumatoid arthritis with rheumatoid factor of left wrist without organ or systems involvement: Secondary | ICD-10-CM | POA: Diagnosis not present

## 2020-01-04 DIAGNOSIS — S72002S Fracture of unspecified part of neck of left femur, sequela: Secondary | ICD-10-CM | POA: Diagnosis not present

## 2020-01-04 DIAGNOSIS — E43 Unspecified severe protein-calorie malnutrition: Secondary | ICD-10-CM | POA: Diagnosis not present

## 2020-01-04 DIAGNOSIS — Z8249 Family history of ischemic heart disease and other diseases of the circulatory system: Secondary | ICD-10-CM

## 2020-01-04 DIAGNOSIS — Z681 Body mass index (BMI) 19 or less, adult: Secondary | ICD-10-CM | POA: Diagnosis not present

## 2020-01-04 DIAGNOSIS — Z20822 Contact with and (suspected) exposure to covid-19: Secondary | ICD-10-CM | POA: Diagnosis not present

## 2020-01-04 DIAGNOSIS — Z7401 Bed confinement status: Secondary | ICD-10-CM | POA: Diagnosis not present

## 2020-01-04 DIAGNOSIS — F17210 Nicotine dependence, cigarettes, uncomplicated: Secondary | ICD-10-CM | POA: Diagnosis present

## 2020-01-04 DIAGNOSIS — E213 Hyperparathyroidism, unspecified: Secondary | ICD-10-CM | POA: Diagnosis present

## 2020-01-04 DIAGNOSIS — A310 Pulmonary mycobacterial infection: Secondary | ICD-10-CM | POA: Diagnosis present

## 2020-01-04 DIAGNOSIS — J449 Chronic obstructive pulmonary disease, unspecified: Secondary | ICD-10-CM | POA: Diagnosis present

## 2020-01-04 DIAGNOSIS — Z4789 Encounter for other orthopedic aftercare: Secondary | ICD-10-CM | POA: Diagnosis not present

## 2020-01-04 DIAGNOSIS — W109XXA Fall (on) (from) unspecified stairs and steps, initial encounter: Secondary | ICD-10-CM | POA: Diagnosis present

## 2020-01-04 DIAGNOSIS — D649 Anemia, unspecified: Secondary | ICD-10-CM | POA: Diagnosis not present

## 2020-01-04 DIAGNOSIS — D62 Acute posthemorrhagic anemia: Secondary | ICD-10-CM | POA: Diagnosis not present

## 2020-01-04 DIAGNOSIS — M80052A Age-related osteoporosis with current pathological fracture, left femur, initial encounter for fracture: Secondary | ICD-10-CM | POA: Diagnosis not present

## 2020-01-04 DIAGNOSIS — Z825 Family history of asthma and other chronic lower respiratory diseases: Secondary | ICD-10-CM

## 2020-01-04 DIAGNOSIS — D509 Iron deficiency anemia, unspecified: Secondary | ICD-10-CM | POA: Diagnosis present

## 2020-01-04 DIAGNOSIS — R269 Unspecified abnormalities of gait and mobility: Secondary | ICD-10-CM | POA: Diagnosis not present

## 2020-01-04 DIAGNOSIS — S72142A Displaced intertrochanteric fracture of left femur, initial encounter for closed fracture: Secondary | ICD-10-CM | POA: Diagnosis present

## 2020-01-04 DIAGNOSIS — Z800 Family history of malignant neoplasm of digestive organs: Secondary | ICD-10-CM

## 2020-01-04 DIAGNOSIS — M2550 Pain in unspecified joint: Secondary | ICD-10-CM | POA: Diagnosis not present

## 2020-01-04 DIAGNOSIS — J9690 Respiratory failure, unspecified, unspecified whether with hypoxia or hypercapnia: Secondary | ICD-10-CM | POA: Diagnosis not present

## 2020-01-04 DIAGNOSIS — S79929A Unspecified injury of unspecified thigh, initial encounter: Secondary | ICD-10-CM | POA: Diagnosis not present

## 2020-01-04 DIAGNOSIS — G9341 Metabolic encephalopathy: Secondary | ICD-10-CM | POA: Diagnosis present

## 2020-01-04 DIAGNOSIS — E785 Hyperlipidemia, unspecified: Secondary | ICD-10-CM | POA: Diagnosis present

## 2020-01-04 DIAGNOSIS — J9622 Acute and chronic respiratory failure with hypercapnia: Secondary | ICD-10-CM | POA: Diagnosis present

## 2020-01-04 DIAGNOSIS — A419 Sepsis, unspecified organism: Principal | ICD-10-CM | POA: Diagnosis present

## 2020-01-04 DIAGNOSIS — R52 Pain, unspecified: Secondary | ICD-10-CM | POA: Diagnosis not present

## 2020-01-04 DIAGNOSIS — Z888 Allergy status to other drugs, medicaments and biological substances status: Secondary | ICD-10-CM

## 2020-01-04 DIAGNOSIS — I10 Essential (primary) hypertension: Secondary | ICD-10-CM | POA: Diagnosis present

## 2020-01-04 DIAGNOSIS — M25552 Pain in left hip: Secondary | ICD-10-CM | POA: Diagnosis not present

## 2020-01-04 DIAGNOSIS — Z808 Family history of malignant neoplasm of other organs or systems: Secondary | ICD-10-CM

## 2020-01-04 DIAGNOSIS — R7881 Bacteremia: Secondary | ICD-10-CM | POA: Diagnosis not present

## 2020-01-04 DIAGNOSIS — W19XXXA Unspecified fall, initial encounter: Secondary | ICD-10-CM | POA: Diagnosis not present

## 2020-01-04 DIAGNOSIS — Z8051 Family history of malignant neoplasm of kidney: Secondary | ICD-10-CM

## 2020-01-04 DIAGNOSIS — I959 Hypotension, unspecified: Secondary | ICD-10-CM | POA: Diagnosis not present

## 2020-01-04 LAB — COMPREHENSIVE METABOLIC PANEL
ALT: 13 U/L (ref 0–44)
AST: 14 U/L — ABNORMAL LOW (ref 15–41)
Albumin: 2.3 g/dL — ABNORMAL LOW (ref 3.5–5.0)
Alkaline Phosphatase: 85 U/L (ref 38–126)
Anion gap: 8 (ref 5–15)
BUN: 21 mg/dL (ref 8–23)
CO2: 32 mmol/L (ref 22–32)
Calcium: 9.3 mg/dL (ref 8.9–10.3)
Chloride: 97 mmol/L — ABNORMAL LOW (ref 98–111)
Creatinine, Ser: 0.8 mg/dL (ref 0.44–1.00)
GFR, Estimated: >60 mL/min (ref >60)
Glucose, Bld: 85 mg/dL (ref 70–99)
Potassium: 4.3 mmol/L (ref 3.5–5.1)
Sodium: 137 mmol/L (ref 135–145)
Total Bilirubin: 0.7 mg/dL (ref 0.3–1.2)
Total Protein: 5.7 g/dL — ABNORMAL LOW (ref 6.5–8.1)

## 2020-01-04 LAB — SURGICAL PCR SCREEN
MRSA, PCR: NEGATIVE
Staphylococcus aureus: NEGATIVE

## 2020-01-04 LAB — IRON AND TIBC
Iron: 23 ug/dL — ABNORMAL LOW (ref 28–170)
Saturation Ratios: 7 % — ABNORMAL LOW (ref 10.4–31.8)
TIBC: 323 ug/dL (ref 250–450)
UIBC: 300 ug/dL

## 2020-01-04 LAB — CBC
HCT: 26.4 % — ABNORMAL LOW (ref 36.0–46.0)
Hemoglobin: 8.3 g/dL — ABNORMAL LOW (ref 12.0–15.0)
MCH: 29.9 pg (ref 26.0–34.0)
MCHC: 31.4 g/dL (ref 30.0–36.0)
MCV: 95 fL (ref 80.0–100.0)
Platelets: 299 10*3/uL (ref 150–400)
RBC: 2.78 MIL/uL — ABNORMAL LOW (ref 3.87–5.11)
RDW: 15.7 % — ABNORMAL HIGH (ref 11.5–15.5)
WBC: 12.4 10*3/uL — ABNORMAL HIGH (ref 4.0–10.5)
nRBC: 0.2 % (ref 0.0–0.2)

## 2020-01-04 LAB — FERRITIN: Ferritin: 22 ng/mL (ref 11–307)

## 2020-01-04 LAB — RESPIRATORY PANEL BY RT PCR (FLU A&B, COVID)
Influenza A by PCR: NEGATIVE
Influenza B by PCR: NEGATIVE
SARS Coronavirus 2 by RT PCR: NEGATIVE

## 2020-01-04 LAB — PREPARE RBC (CROSSMATCH)

## 2020-01-04 MED ORDER — PHENYLEPHRINE HCL-NACL 10-0.9 MG/250ML-% IV SOLN
INTRAVENOUS | Status: AC
  Filled 2020-01-04: qty 250

## 2020-01-04 MED ORDER — EPHEDRINE 5 MG/ML INJ
INTRAVENOUS | Status: AC
  Filled 2020-01-04: qty 10

## 2020-01-04 MED ORDER — FENTANYL CITRATE (PF) 100 MCG/2ML IJ SOLN
INTRAMUSCULAR | Status: AC
  Filled 2020-01-04: qty 2

## 2020-01-04 MED ORDER — SODIUM CHLORIDE 0.9% FLUSH: 10.0000 mL | INTRAVENOUS | Status: DC | PRN

## 2020-01-04 MED ORDER — LIDOCAINE 2% (20 MG/ML) 5 ML SYRINGE
INTRAMUSCULAR | Status: AC
  Filled 2020-01-04: qty 5

## 2020-01-04 MED ORDER — CEFAZOLIN SODIUM-DEXTROSE 2-4 GM/100ML-% IV SOLN
2.0000 g | INTRAVENOUS | Status: DC
  Filled 2020-01-04: qty 100

## 2020-01-04 MED ORDER — ROCURONIUM BROMIDE 10 MG/ML (PF) SYRINGE
PREFILLED_SYRINGE | INTRAVENOUS | Status: AC
  Filled 2020-01-04: qty 10

## 2020-01-04 MED ORDER — HYDROCODONE-ACETAMINOPHEN 5-325 MG PO TABS: 1.0000 | ORAL_TABLET | Freq: Four times a day (QID) | ORAL | Status: DC | PRN

## 2020-01-04 MED ORDER — FENTANYL CITRATE (PF) 100 MCG/2ML IJ SOLN
50.0000 ug | Freq: Once | INTRAMUSCULAR | Status: AC
  Administered 2020-01-04: 50 ug via INTRAVENOUS
  Filled 2020-01-04: qty 2

## 2020-01-04 MED ORDER — SODIUM CHLORIDE 0.9 % IV SOLN: INTRAVENOUS | Status: DC

## 2020-01-04 MED ORDER — SENNOSIDES-DOCUSATE SODIUM 8.6-50 MG PO TABS: 1.0000 | ORAL_TABLET | Freq: Every evening | ORAL | Status: DC | PRN

## 2020-01-04 MED ORDER — MORPHINE SULFATE (PF) 2 MG/ML IV SOLN: 0.5000 mg | INTRAVENOUS | Status: DC | PRN

## 2020-01-04 MED ORDER — ENOXAPARIN SODIUM 30 MG/0.3ML ~~LOC~~ SOLN: 30.0000 mg | SUBCUTANEOUS | Status: DC

## 2020-01-04 MED ORDER — ONDANSETRON HCL 4 MG/2ML IJ SOLN
INTRAMUSCULAR | Status: AC
  Filled 2020-01-04: qty 2

## 2020-01-04 MED ORDER — CHLORHEXIDINE GLUCONATE CLOTH 2 % EX PADS
6.0000 | MEDICATED_PAD | Freq: Every day | CUTANEOUS | Status: DC
  Administered 2020-01-04: 6 via TOPICAL

## 2020-01-04 MED ORDER — PROPOFOL 10 MG/ML IV BOLUS
INTRAVENOUS | Status: AC
  Filled 2020-01-04: qty 20

## 2020-01-04 MED ORDER — PHENYLEPHRINE 40 MCG/ML (10ML) SYRINGE FOR IV PUSH (FOR BLOOD PRESSURE SUPPORT)
PREFILLED_SYRINGE | INTRAVENOUS | Status: AC
  Filled 2020-01-04: qty 10

## 2020-01-04 MED ORDER — DEXAMETHASONE SODIUM PHOSPHATE 10 MG/ML IJ SOLN
INTRAMUSCULAR | Status: AC
  Filled 2020-01-04: qty 1

## 2020-01-04 MED ORDER — ISOPROPYL ALCOHOL 70 % SOLN
Status: AC
  Filled 2020-01-04: qty 480

## 2020-01-04 NOTE — Consult Note (Addendum)
Olivia Hooper is an 79 y.o. female.    Chief Complaint:   Left hip pain s/p fall  HPI: 79 y/o female with a ground level fall earlier today. Pt slipped and fell down several steps. C/o immediate left hip pain. Denies any other symptoms or injuries. Denies any LOC, dizziness or headache. Currently no on any anti coagulation. Recent lung infection followed by infectious disease. Denies any orthopedic issues in the past. Has seen a back specialist for her compression fractures. After last fall no other complaints other than the hip. Denies LBP  PCP:  [name], MD  PMH: Past Medical History:  Diagnosis Date  . Anemia 05/03/2019  . Anorexia 10/23/2019  . Breast cancer ([place])    on left  . Carpal tunnel syndrome   . COPD (chronic obstructive pulmonary disease) ([place])   . Drug-induced anemia 05/03/2019  . Dyspnea   . Hair loss 10/23/2019  . Hypercalcemia   . Hyperlipidemia   . Hyperparathyroidism ([place])   . Hyperparathyroidism ([place]) 10/23/2019  . Osteoporosis   . Pneumonia   . Pulmonary mycobacterial infection ([place]) 03/14/2019  . RA (rheumatoid arthritis) ([place])   . Sleeping excessive 05/03/2019  . Vaccine counseling 10/23/2019  . Vertigo   . Vitamin D deficiency     PSes:  Allergies  Allergen Reactions  . Buprenorphine Hcl Nausea And Vomiting  . Morphine And Related Nausea And Vomiting         Medications: No current facility-administered medications for this encounter.   Current Outpatient Medications  Medication Sig Dispense Refill  . acetaminophen (TYLENOL) 500 MG tablet Take 500 mg by mouth every 8 (eight) hours as needed for mild pain.     Marland Kitchen albuterol (VENTOLIN HFA) 108 (90 Base) MCG/ACT inhaler Inhale 2 puffs into the lungs every 6 (six) hours as needed for wheezing or shortness of breath.     . AMBULATORY NON FORMULARY MEDICATION Take 100 mg by mouth daily. Medication Name: clofazimine for mycobacterium abscessus pulmonary infection 100 capsule 2  . ascorbic acid (VITAMIN  C) 500 MG tablet Take 1 tablet (500 mg total) by mouth 2 (two) times daily. 30 tablet 0  . cefOXitin 8 g in dextrose 5 % 50 mL Inject 8 g into the vein continuous. 8 g 11  . diphenoxylate-atropine (LOMOTIL) 2.5-0.025 MG/5ML liquid Take 5 mLs by mouth 4 (four) times daily as needed for diarrhea or loose stools. 60 mL 0  . fluconazole (DIFLUCAN) 100 MG tablet Take 2 tablets (200 mg total) by mouth daily. 20 tablet 0  . guaiFENesin-dextromethorphan (ROBITUSSIN DM) 100-10 MG/5ML syrup Take 5 mLs by mouth every 4 (four) hours as needed for cough. 118 mL 0  . Ipratropium-Albuterol (COMBIVENT) 20-100 MCG/ACT AERS respimat Inhale 2 puffs into the lungs every 6 (six) hours. 4 g 0  . lidocaine-prilocaine (EMLA) cream Apply 1 application topically as needed. 30 g 11  . linezolid (ZYVOX) 600 MG tablet Take 1 tablet (600 mg total) by mouth daily. 30 tablet 11  . mometasone-formoterol (DULERA) 100-5 MCG/ACT AERO Inhale 2 puffs into the lungs 2 (two) times daily as needed for wheezing or shortness of breath.     . mupirocin ointment (BACTROBAN) 2 % Apply 1 application topically 2 (two) times daily. 30 g 2  . Polyethyl Glycol-Propyl Glycol (SYSTANE OP) Place 1 drop into both eyes daily as needed (dry eyes). (Patient not taking: Reported on 10/23/2019)    . predniSONE (DELTASONE) 5 MG tablet Take 5-10 mg by mouth daily  as needed.    . torsemide (DEMADEX) 5 MG tablet Take 5 mg by mouth daily. (Patient not taking: Reported on 11/23/2019)    . traMADol (ULTRAM) 50 MG tablet Take 50 mg by mouth 2 (two) times daily as needed for moderate pain.     Marland Kitchen zinc sulfate 220 (50 Zn) MG capsule Take 1 capsule (220 mg total) by mouth daily.      Results for orders placed or performed during the hospital encounter of 01/04/20 (from the past 48 hour(s))  CBC     Status: Abnormal   Collection Time: 01/04/20  1:21 PM  Result Value Ref Range   WBC 12.4 (H) 4.0 - 10.5 K/uL   RBC 2.78 (L) 3.87 - 5.11 MIL/uL   Hemoglobin 8.3 (L) 12.0 -  15.0 g/dL   HCT 26.4 (L) 36 - 46 %   MCV 95.0 80.0 - 100.0 fL   MCH 29.9 26.0 - 34.0 pg   MCHC 31.4 30.0 - 36.0 g/dL   RDW 15.7 (H) 11.5 - 15.5 %   Platelets 299 150 - 400 K/uL   nRBC 0.2 0.0 - 0.2 %    Comment: Performed at [facility], [place] [address], [place], [place] [address]  Comprehensive metabolic panel     Status: Abnormal   Collection Time: 01/04/20  1:21 PM  Result Value Ref Range   Sodium 137 135 - 145 mmol/L   Potassium 4.3 3.5 - 5.1 mmol/L   Chloride 97 (L) 98 - 111 mmol/L   CO2 32 22 - 32 mmol/L   Glucose, Bld 85 70 - 99 mg/dL    Comment: Glucose reference range applies only to samples taken after fasting for at least 8 hours.   BUN 21 8 - 23 mg/dL   Creatinine, Ser 0.80 0.44 - 1.00 mg/dL   Calcium 9.3 8.9 - 10.3 mg/dL   Total Protein 5.7 (L) 6.5 - 8.1 g/dL   Albumin 2.3 (L) 3.5 - 5.0 g/dL   AST 14 (L) 15 - 41 U/L   ALT 13 0 - 44 U/L   Alkaline Phosphatase 85 38 - 126 U/L   Total Bilirubin 0.7 0.3 - 1.2 mg/dL   GFR, Estimated >60 >60 mL/min    Comment: (NOTE) Calculated using the CKD-EPI Creatinine Equation (2021)    Anion gap 8 5 - 15    Comment: Performed at [facility], [place] [address], [place], [place] [address]   DG Pelvis Portable  Result Date: 01/04/2020 CLINICAL DATA:  Possible left hip fracture. Fall down 4 stairs at home. Hip pain. EXAM: PORTABLE PELVIS 1-2 VIEWS COMPARISON:  None. FINDINGS: Acute oblique subtrochanteric fracture of the left femur with extension to involve the greater and lesser trochanters. There is foreshortening and approximately 1 shaft with medial displacement. The hips appear located on the single radiograph. Mild bilateral hip degenerative change. Osteopenia. Lower lumbar degenerative change. IMPRESSION: Acute oblique subtrochanteric fracture of the left femur with extension to involve the greater and lesser trochanters. There is foreshortening and approximately 1 shaft with medial  displacement. Electronically Signed   By: Margaretha Sheffield MD   On: 01/04/2020 13:10    ROS: Unable to ambulate after her fall  Usually walks without any assistance No previous left hip pain or symptoms  Physical Exam: Alert and appropriate 79 y/o female in no acute distress Cervical spine with full rom, no tenderness Bilateral upper extremity with full rom, no tenderness, no deformity nv intact distally bilaterally Left  lower extremity with shortening and external rotation Pelvis tender on left side Right lower extremity with full rom  No tenderness or deformity nv intact distally bilaterally No signs of open injury  No tenderness over the T or L spine  Assessment/Plan Assessment: left subtrochanteric femur fracture  Plan: Discussed case with Dr. Veverly Fells and Dr. Lyla Glassing Recommend operative management for the left femur fracture Pt and family in agreement All questions encouraged and answered Plan for IM nail with Dr. Lyla Glassing later this afternoon Keep NPO Strict bedrest Pain management as needed   Merla Riches PA-C, [facility] now [facility]  [place] [address], [address], [place], [place] [address] Phone: [phone] [email or web address] Facebook  [employer]     I have examined the patient and agree with the above assessment and plan.   Augustin Schooling MD

## 2020-01-04 NOTE — H&P (Signed)
History and Physical        Hospital Admission Note Date: 01/04/2020  Patient name: Olivia Hooper Medical record number: [id number] Date of birth: [birth year] Age: 79 y.o. Gender: female  PCP: [name], MD  Patient coming from: home  At baseline, ambulates: independently  Chief Complaint    Chief Complaint  Patient presents with  . Fall      HPI:   This is an 79 year old female with past medical history of chronic hypoxic respiratory failure on 2 L at baseline, COPD, rheumatoid arthritis, osteoporosis, hypertension, hyperlipidemia, Mycobacterium abscessus lung infection s/p treatment who presented to the ED with chief complaint of fall.  She went to walk outside and fell down approximately 4 steps onto her left hip onto the concrete.  Believes that she tripped over some carpeting which made her fall.  No loss of consciousness, did not hit her head, not on blood thinners.  No prodromal symptoms.  No other complaints at this time.  Currently has some left hip discomfort but otherwise no issues  ED Course: Afebrile and hemodynamically stable on baseline 2 LPM.  Labs notable for leukocytosis Hb 8.3 (previously 9.7 in April).  Left hip x-ray positive for left femur subtrochanteric fracture with extension to greater and lesser trochanters.  Orthopedic surgery was consulted by the ED.  Vitals:   01/04/20 1430 01/04/20 1500  BP: 140/67 (!) 160/72  Pulse: 92 89  Resp: 15 18  Temp: 98 F (36.7 C) 98.1 F (36.7 C)  SpO2: 95% 100%     Review of Systems:  Review of Systems  All other systems reviewed and are negative.   Medical/Social/Family History   Past Medical History: Past Medical History:  Diagnosis Date  . Anemia 05/03/2019  . Anorexia 10/23/2019  . Breast cancer ([place])    on left  . Carpal tunnel syndrome   . COPD (chronic obstructive pulmonary disease) ([place])   .  Drug-induced anemia 05/03/2019  . Dyspnea   . Hair loss 10/23/2019  . Hypercalcemia   . Hyperlipidemia   . Hyperparathyroidism ([place])   . Hyperparathyroidism ([place]) 10/23/2019  . Osteoporosis   . Pneumonia   . Pulmonary mycobacterial infection ([place]) 03/14/2019  . RA (rheumatoid arthritis) ([place])   . Sleeping excessive 05/03/2019  . Vaccine counseling 10/23/2019  . Vertigo   . Vitamin D deficiency     Past Surgical History:  Procedure Laterality Date  . CARPAL TUNNEL RELEASE    . CATARACT EXTRACTION    . IR IMAGING GUIDED PORT INSERTION  04/05/2019  . MASTECTOMY Left   . VESICOVAGINAL FISTULA CLOSURE W/ TAH    . VIDEO BRONCHOSCOPY WITH ENDOBRONCHIAL ULTRASOUND N/A 01/18/2019   Procedure: VIDEO BRONCHOSCOPY WITH ENDOBRONCHIAL ULTRASOUND;  Surgeon: Candee Furbish, MD;  Location: [facility] OR;  Service: Thoracic;  Laterality: N/A;    Medications: Prior to Admission medications   Medication Sig Start Date End Date Taking? Authorizing Provider  acetaminophen (TYLENOL) 500 MG tablet Take 1,000 mg by mouth every 8 (eight) hours as needed for mild pain.    Yes [provider]  albuterol (VENTOLIN HFA) 108 (90 Base) MCG/ACT inhaler Inhale 2 puffs into the lungs every 6 (six) hours as needed for wheezing  or shortness of breath.    Yes [provider]  ascorbic acid (VITAMIN C) 500 MG tablet Take 1 tablet (500 mg total) by mouth 2 (two) times daily. 05/22/19  Yes Hosie Poisson, MD  guaiFENesin-dextromethorphan (ROBITUSSIN DM) 100-10 MG/5ML syrup Take 5 mLs by mouth every 4 (four) hours as needed for cough. 05/22/19  Yes Hosie Poisson, MD  Ipratropium-Albuterol (COMBIVENT) 20-100 MCG/ACT AERS respimat Inhale 2 puffs into the lungs every 6 (six) hours. 05/22/19  Yes Hosie Poisson, MD  Lactobacillus (PROBIOTIC ACIDOPHILUS PO) Take 1 tablet by mouth daily.   Yes [provider]  lidocaine-prilocaine (EMLA) cream Apply 1 application topically as needed. Patient taking differently: Apply 1  application topically daily as needed (pain).  11/23/19  Yes Tommy Medal, Lavell Islam, MD  mometasone-formoterol [facility]) 100-5 MCG/ACT AERO Inhale 2 puffs into the lungs 2 (two) times daily as needed for wheezing or shortness of breath.    Yes [provider]  predniSONE (DELTASONE) 5 MG tablet Take 5 mg by mouth 2 (two) times daily as needed (for pain).   Yes [provider]  traMADol (ULTRAM) 50 MG tablet Take 50 mg by mouth 2 (two) times daily as needed for moderate pain.    Yes [provider]  zinc gluconate 50 MG tablet Take 50 mg by mouth daily.   Yes [provider]  AMBULATORY NON FORMULARY MEDICATION Take 100 mg by mouth daily. Medication Name: clofazimine for mycobacterium abscessus pulmonary infection Patient not taking: Reported on 01/04/2020 03/27/19   Kuppelweiser, [name] L, RPH-CPP  cefOXitin 8 g in dextrose 5 % 50 mL Inject 8 g into the vein continuous. Patient not taking: Reported on 01/04/2020 06/01/19   Kuppelweiser, [name] L, RPH-CPP  diphenoxylate-atropine (LOMOTIL) 2.5-0.025 MG/5ML liquid Take 5 mLs by mouth 4 (four) times daily as needed for diarrhea or loose stools. Patient not taking: Reported on 01/04/2020 09/06/19   Tommy Medal, Lavell Islam, MD  fluconazole (DIFLUCAN) 100 MG tablet Take 2 tablets (200 mg total) by mouth daily. Patient not taking: Reported on 01/04/2020 06/05/19   Tommy Medal, Lavell Islam, MD  linezolid (ZYVOX) 600 MG tablet Take 1 tablet (600 mg total) by mouth daily. Patient not taking: Reported on 01/04/2020 09/18/19   Kuppelweiser, [name] L, RPH-CPP  mupirocin ointment (BACTROBAN) 2 % Apply 1 application topically 2 (two) times daily. Patient not taking: Reported on 01/04/2020 05/01/19   Jeilani Slade, DPM  zinc sulfate 220 (50 Zn) MG capsule Take 1 capsule (220 mg total) by mouth daily. Patient not taking: Reported on 01/04/2020 05/23/19   Hosie Poisson, MD    Allergies:   Allergies  Allergen Reactions  . Buprenorphine  Hcl Nausea And Vomiting  . Morphine And Related Nausea And Vomiting         Social History:  reports that she has been smoking cigarettes. She has a 31.00 pack-year smoking history. She has never used smokeless tobacco. She reports current alcohol use. She reports that she does not use drugs.  Family History: Family History  Problem Relation Age of Onset  . Heart disease Father   . Bone cancer Father   . Emphysema Father        never smoker  . Heart disease Mother   . Colon cancer Mother   . Melanoma Sister   . Kidney cancer Brother      Objective   Physical Exam: Blood pressure (!) 160/72, pulse 89, temperature 98.1 F (36.7 C), temperature source Axillary, resp. rate  18, height 5\' 1"  (1.549 m), weight 36 kg, SpO2 100 %.  Physical Exam Vitals and nursing note reviewed. Exam conducted with a chaperone present.  Constitutional:      Appearance: Normal appearance.  HENT:     Head: Normocephalic and atraumatic.  Eyes:     Conjunctiva/sclera: Conjunctivae normal.  Cardiovascular:     Rate and Rhythm: Normal rate and regular rhythm.  Pulmonary:     Effort: Pulmonary effort is normal.     Breath sounds: Normal breath sounds.  Abdominal:     General: Abdomen is flat.     Palpations: Abdomen is soft.  Musculoskeletal:     Comments: LLE externally rotated  Skin:    Coloration: Skin is not jaundiced or pale.  Neurological:     Mental Status: She is alert. Mental status is at baseline.  Psychiatric:        Mood and Affect: Mood normal.        Behavior: Behavior normal.     LABS on Admission: I have personally reviewed all the labs and imaging below    Basic Metabolic Panel: Recent Labs  Lab 01/04/20 1321  NA 137  K 4.3  CL 97*  CO2 32  GLUCOSE 85  BUN 21  CREATININE 0.80  CALCIUM 9.3   Liver Function Tests: Recent Labs  Lab 01/04/20 1321  AST 14*  ALT 13  ALKPHOS 85  BILITOT 0.7  PROT 5.7*  ALBUMIN 2.3*   No results for input(s): LIPASE, AMYLASE  in the last 168 hours. No results for input(s): AMMONIA in the last 168 hours. CBC: Recent Labs  Lab 01/04/20 1321  WBC 12.4*  HGB 8.3*  HCT 26.4*  MCV 95.0  PLT 299   Cardiac Enzymes: No results for input(s): CKTOTAL, CKMB, CKMBINDEX, TROPONINI in the last 168 hours. BNP: Invalid input(s): POCBNP CBG: No results for input(s): GLUCAP in the last 168 hours.  Radiological Exams on Admission:  DG Pelvis Portable  Result Date: 01/04/2020 CLINICAL DATA:  Possible left hip fracture. Fall down 4 stairs at home. Hip pain. EXAM: PORTABLE PELVIS 1-2 VIEWS COMPARISON:  None. FINDINGS: Acute oblique subtrochanteric fracture of the left femur with extension to involve the greater and lesser trochanters. There is foreshortening and approximately 1 shaft with medial displacement. The hips appear located on the single radiograph. Mild bilateral hip degenerative change. Osteopenia. Lower lumbar degenerative change. IMPRESSION: Acute oblique subtrochanteric fracture of the left femur with extension to involve the greater and lesser trochanters. There is foreshortening and approximately 1 shaft with medial displacement. Electronically Signed   By: Margaretha Sheffield MD   On: 01/04/2020 13:10   DG FEMUR MIN 2 VIEWS LEFT  Result Date: 01/04/2020 CLINICAL DATA:  79 year old female status post fall and left lower extremity pain. EXAM: LEFT FEMUR 2 VIEWS COMPARISON:  Pelvic radiograph dated 01/04/2020. FINDINGS: Displaced oblique fracture of the proximal femur as described on the pelvic radiograph. No other fracture identified involving the more distal portion of the femur. The bones are osteopenic. There is no dislocation. The soft tissues are unremarkable. IMPRESSION: Displaced oblique fracture of the proximal femur. No dislocation. Electronically Signed   By: Anner Crete M.D.   On: 01/04/2020 16:22      EKG: Independently reviewed.    A & P   Principal Problem:   Closed left hip fracture,  initial encounter ([place]) Active Problems:   COPD GOLD II / still smoking    Rheumatoid arthritis ([place])   Pulmonary mycobacterial  infection ([place])   Anemia   1. Left subtrochanteric femur fracture a. Plan for surgery today b. Management per Ortho  2. Chronic hypoxic respiratory failure secondary to COPD, at baseline 2 LPM  3. Normocytic anemia a. Baseline is about 9.7, currently 8.3 b. Probably AoCD c. will check iron studies  4. Rheumatoid arthritis, stable a. Continue as needed prednisone  5. Recent history of Mycobacterium abscesses lung infection, stable a. S/p treatment by Dr. Drucilla Schmidt   DVT prophylaxis: SCDs   Code Status: Full Code  Diet: N.p.o. Family Communication: Admission, patients condition and plan of care including tests being ordered have been discussed with the patient who indicates understanding and agrees with the plan and Code Status. Patient's daughter was updated  Disposition Plan: The appropriate patient status for this patient is INPATIENT. Inpatient status is judged to be reasonable and necessary in order to provide the required intensity of service to ensure the patient's safety. The patient's presenting symptoms, physical exam findings, and initial radiographic and laboratory data in the context of their chronic comorbidities is felt to place them at high risk for further clinical deterioration. Furthermore, it is not anticipated that the patient will be medically stable for discharge from the hospital within 2 midnights of admission. The following factors support the patient status of inpatient.   " The patient's presenting symptoms include fall. " The worrisome physical exam findings include left hip pain. " The initial radiographic and laboratory data are worrisome because of left hip fracture. " The chronic co-morbidities include osteoporosis, rheumatoid arthritis.   * I certify that at the point of admission it is my clinical judgment that the patient  will require inpatient hospital care spanning beyond 2 midnights from the point of admission due to high intensity of service, high risk for further deterioration and high frequency of surveillance required.*   Status is: Inpatient  Remains inpatient appropriate because:Ongoing active pain requiring inpatient pain management and Inpatient level of care appropriate due to severity of illness   Dispo: The patient is from: Home              Anticipated d/c is to: SNF              Anticipated d/c date is: 2 days              Patient currently is not medically stable to d/c.       Consultants  . Orthopedic surgery  Procedures  . None  Time Spent on Admission: 56 minutes    Harold Hedge, DO [facility]  01/04/2020, 5:47 PM

## 2020-01-04 NOTE — ED Triage Notes (Signed)
S/p fall yesterday down 4 stairs at home. Complaining of left hip pain. -loc, -blood thinners. +shortening, -rotation. Decreased ROM

## 2020-01-04 NOTE — Plan of Care (Signed)
Patient admitted to unit, care plans initiated.

## 2020-01-04 NOTE — ED Provider Notes (Signed)
[facility] Emergency Department Provider Note MRN:  [id number]  Arrival date & time: 01/04/20     Chief Complaint   Fall   History of Present Illness   Olivia Hooper is a 79 y.o. year-old female with a history of COPD, RA presenting to the ED with chief complaint of fall.  Patient tripped and fell while going down the stairs, fell down approximately four steps onto her hip.  She is endorsing severe left hip pain worse with motion or palpation.  Denies head trauma, no loss of consciousness, no nausea or vomiting, no chest pain or shortness of breath, no dizziness preceding the fall, no abdominal pain, no other injuries.  Review of Systems  A complete 10 system review of systems was obtained and all systems are negative except as noted in the HPI and PMH.   Patient's Health History    Past Medical History:  Diagnosis Date  . Anemia 05/03/2019  . Anorexia 10/23/2019  . Breast cancer ([place])    on left  . Carpal tunnel syndrome   . COPD (chronic obstructive pulmonary disease) ([place])   . Drug-induced anemia 05/03/2019  . Dyspnea   . Hair loss 10/23/2019  . Hypercalcemia   . Hyperlipidemia   . Hyperparathyroidism ([place])   . Hyperparathyroidism ([place]) 10/23/2019  . Osteoporosis   . Pneumonia   . 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 RA (rheumatoid arthritis) ([place])   . Sleeping excessive 05/03/2019  . Vaccine counseling 10/23/2019  . Vertigo   . Vitamin D deficiency     Past Surgical History:  Procedure Laterality Date  . CARPAL TUNNEL RELEASE    . CATARACT EXTRACTION    . IR IMAGING GUIDED PORT INSERTION  04/05/2019  . MASTECTOMY Left   . VESICOVAGINAL FISTULA CLOSURE W/ TAH    . VIDEO BRONCHOSCOPY WITH ENDOBRONCHIAL ULTRASOUND N/A 01/18/2019   Procedure: VIDEO BRONCHOSCOPY WITH ENDOBRONCHIAL ULTRASOUND;  Surgeon: Candee Furbish, MD;  Location: [facility] OR;  Service: Thoracic;  Laterality: N/A;    Family History  Problem Relation Age of Onset  .  Heart disease Father   . Bone cancer Father   . Emphysema Father        never smoker  . Heart disease Mother   . Colon cancer Mother   . Melanoma Sister   . Kidney cancer Brother     Social History   Socioeconomic History  . Marital status: Widowed    Spouse name: Not on file  . Number of children: 4  . Years of education: 12+  . Highest education level: Associate degree: occupational, Hotel manager, or vocational program  Occupational History  . Occupation: Retired   Tobacco Use  . Smoking status: Heavy Tobacco Smoker    Packs/day: 0.50    Years: 62.00    Pack years: 31.00    Types: Cigarettes    Last attempt to quit: 01/12/2019    Years since quitting: 0.9  . Smokeless tobacco: Never Used  Vaping Use  . Vaping Use: Never used  Substance and Sexual Activity  . Alcohol use: Yes    Alcohol/week: 0.0 standard drinks    Comment: occ  . Drug use: No  . Sexual activity: Not on file  Other Topics Concern  . Not on file  Social History Narrative   Lives alone, 4 children, independent.   Social Determinants of Health   Financial Resource Strain: Low Risk   . Difficulty of Paying Living Expenses: Not hard at all  Food Insecurity: No Food Insecurity  . Worried About Charity fundraiser in the Last Year: Never true  . Ran Out of Food in the Last Year: Never true  Transportation Needs: No Transportation Needs  . Lack of Transportation (Medical): No  . Lack of Transportation (Non-Medical): No  Physical Activity: Inactive  . Days of Exercise per Week: 0 days  . Minutes of Exercise per Session: 0 min  Stress: No Stress Concern Present  . Feeling of Stress : Only a little  Social Connections: Moderately Integrated  . Frequency of Communication with Friends and Family: More than three times a week  . Frequency of Social Gatherings with Friends and Family: More than three times a week  . Attends Religious Services: More than 4 times per year  . Active Member of Clubs or  Organizations: Yes  . Attends Archivist Meetings: More than 4 times per year  . Marital Status: Widowed  Intimate Partner Violence: Not At Risk  . Fear of Current or Ex-Partner: No  . Emotionally Abused: No  . Physically Abused: No  . Sexually Abused: No     Physical Exam   Vitals:   01/04/20 1430 01/04/20 1500  BP: 140/67 (!) 160/72  Pulse: 92 89  Resp: 15 18  Temp: 98 F (36.7 C) 98.1 F (36.7 C)  SpO2: 95% 100%    CONSTITUTIONAL: Chronically ill-appearing, NAD NEURO:  Alert and oriented x 3, no focal deficits EYES:  eyes equal and reactive ENT/NECK:  no LAD, no JVD CARDIO: Regular rate, well-perfused, normal S1 and S2 PULM:  CTAB no wheezing or rhonchi GI/GU:  normal bowel sounds, non-distended, non-tender MSK/SPINE: Externally rotated and shortened left lower extremity, neurovascularly intact with strong peripheral pulse SKIN:  no rash, atraumatic PSYCH:  Appropriate speech and behavior  *Additional and/or pertinent findings included in MDM below  Diagnostic and Interventional Summary    EKG Interpretation  Date/Time:    Ventricular Rate:    PR Interval:    QRS Duration:   QT Interval:    QTC Calculation:   R Axis:     Text Interpretation:        Labs Reviewed  CBC - Abnormal; Notable for the following components:      Result Value   WBC 12.4 (*)    RBC 2.78 (*)    Hemoglobin 8.3 (*)    HCT 26.4 (*)    RDW 15.7 (*)    All other components within normal limits  COMPREHENSIVE METABOLIC PANEL - Abnormal; Notable for the following components:   Chloride 97 (*)    Total Protein 5.7 (*)    Albumin 2.3 (*)    AST 14 (*)    All other components within normal limits  RESPIRATORY PANEL BY RT PCR (FLU A&B, COVID)  SURGICAL PCR SCREEN  PREPARE RBC (CROSSMATCH)  TYPE AND SCREEN    DG FEMUR MIN 2 VIEWS LEFT  Final Result    DG Pelvis Portable  Final Result      Medications  enoxaparin (LOVENOX) injection 30 mg ( Subcutaneous Automatically  Held 01/12/20 2200)  HYDROcodone-acetaminophen (NORCO/VICODIN) 5-325 MG per tablet 1-2 tablet ( Oral MAR Hold 01/04/20 1621)  morphine 2 MG/ML injection 0.5 mg ( Intravenous MAR Hold 01/04/20 1621)  senna-docusate (Senokot-S) tablet 1 tablet ( Oral MAR Hold 01/04/20 1621)  ceFAZolin (ANCEF) IVPB 2g/100 mL premix (has no administration in time range)  phenylephrine (NEOSYNEPHRINE) 10-0.9 MG/250ML-% infusion (has no administration in time range)  0.9 %  sodium chloride infusion ( Intravenous New Bag/Given 01/04/20 1635)  fentaNYL (SUBLIMAZE) injection 50 mcg (50 mcg Intravenous Given 01/04/20 1326)     Procedures  /  Critical Care Procedures  ED Course and Medical Decision Making  I have reviewed the triage vital signs, the nursing notes, and pertinent available records from the EMR.  Listed above are laboratory and imaging tests that I personally ordered, reviewed, and interpreted and then considered in my medical decision making (see below for details).  Fractures dislocation, seems to be an isolated injury, x-rays pending.    X-ray confirms intertrochanteric femur fracture, orthopedics is consulted, admitted to hospital service for further care.  Barth Kirks. [place] Small, [place] [email or web address]  Final Clinical Impressions(s) / ED Diagnoses     ICD-10-CM   1. Femur fracture, left (HCC)  S72.92XA DG FEMUR MIN 2 VIEWS LEFT    DG FEMUR MIN 2 VIEWS LEFT    CANCELED: DG FEMUR PORT MIN 2 VIEWS LEFT    CANCELED: DG FEMUR PORT MIN 2 VIEWS LEFT    ED Discharge Orders    None       Discharge Instructions Discussed with and Provided to Patient:   Discharge Instructions   None       Maudie Flakes, MD 01/04/20 1743

## 2020-01-04 NOTE — H&P (View-Only) (Signed)
Olivia Hooper is an 79 y.o. female.    Chief Complaint:   Left hip pain s/p fall  HPI: 79 y/o female with a ground level fall earlier today. Pt slipped and fell down several steps. C/o immediate left hip pain. Denies any other symptoms or injuries. Denies any LOC, dizziness or headache. Currently no on any anti coagulation. Recent lung infection followed by infectious disease. Denies any orthopedic issues in the past. Has seen a back specialist for her compression fractures. After last fall no other complaints other than the hip. Denies LBP  PCP:  [name], MD  PMH: Past Medical History:  Diagnosis Date  . Anemia 05/03/2019  . Anorexia 10/23/2019  . Breast cancer ([place])    on left  . Carpal tunnel syndrome   . COPD (chronic obstructive pulmonary disease) ([place])   . Drug-induced anemia 05/03/2019  . Dyspnea   . Hair loss 10/23/2019  . Hypercalcemia   . Hyperlipidemia   . Hyperparathyroidism ([place])   . Hyperparathyroidism ([place]) 10/23/2019  . Osteoporosis   . Pneumonia   . 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 RA (rheumatoid arthritis) ([place])   . Sleeping excessive 05/03/2019  . Vaccine counseling 10/23/2019  . Vertigo   . Vitamin D deficiency     PSes:  Allergies  Allergen Reactions  . Buprenorphine Hcl Nausea And Vomiting  . Morphine And Related Nausea And Vomiting         Medications: No current facility-administered medications for this encounter.   Current Outpatient Medications  Medication Sig Dispense Refill  . acetaminophen (TYLENOL) 500 MG tablet Take 500 mg by mouth every 8 (eight) hours as needed for mild pain.     Marland Kitchen albuterol (VENTOLIN HFA) 108 (90 Base) MCG/ACT inhaler Inhale 2 puffs into the lungs every 6 (six) hours as needed for wheezing or shortness of breath.     . AMBULATORY NON FORMULARY MEDICATION Take 100 mg by mouth daily. Medication Name: clofazimine for mycobacterium abscessus pulmonary infection 100 capsule 2  . ascorbic acid (VITAMIN  C) 500 MG tablet Take 1 tablet (500 mg total) by mouth 2 (two) times daily. 30 tablet 0  . cefOXitin 8 g in dextrose 5 % 50 mL Inject 8 g into the vein continuous. 8 g 11  . diphenoxylate-atropine (LOMOTIL) 2.5-0.025 MG/5ML liquid Take 5 mLs by mouth 4 (four) times daily as needed for diarrhea or loose stools. 60 mL 0  . fluconazole (DIFLUCAN) 100 MG tablet Take 2 tablets (200 mg total) by mouth daily. 20 tablet 0  . guaiFENesin-dextromethorphan (ROBITUSSIN DM) 100-10 MG/5ML syrup Take 5 mLs by mouth every 4 (four) hours as needed for cough. 118 mL 0  . Ipratropium-Albuterol (COMBIVENT) 20-100 MCG/ACT AERS respimat Inhale 2 puffs into the lungs every 6 (six) hours. 4 g 0  . lidocaine-prilocaine (EMLA) cream Apply 1 application topically as needed. 30 g 11  . linezolid (ZYVOX) 600 MG tablet Take 1 tablet (600 mg total) by mouth daily. 30 tablet 11  . mometasone-formoterol (DULERA) 100-5 MCG/ACT AERO Inhale 2 puffs into the lungs 2 (two) times daily as needed for wheezing or shortness of breath.     . mupirocin ointment (BACTROBAN) 2 % Apply 1 application topically 2 (two) times daily. 30 g 2  . Polyethyl Glycol-Propyl Glycol (SYSTANE OP) Place 1 drop into both eyes daily as needed (dry eyes). (Patient not taking: Reported on 10/23/2019)    . predniSONE (DELTASONE) 5 MG tablet Take 5-10 mg by mouth daily  as needed.    . torsemide (DEMADEX) 5 MG tablet Take 5 mg by mouth daily. (Patient not taking: Reported on 11/23/2019)    . traMADol (ULTRAM) 50 MG tablet Take 50 mg by mouth 2 (two) times daily as needed for moderate pain.     Marland Kitchen zinc sulfate 220 (50 Zn) MG capsule Take 1 capsule (220 mg total) by mouth daily.      Results for orders placed or performed during the hospital encounter of 01/04/20 (from the past 48 hour(s))  CBC     Status: Abnormal   Collection Time: 01/04/20  1:21 PM  Result Value Ref Range   WBC 12.4 (H) 4.0 - 10.5 K/uL   RBC 2.78 (L) 3.87 - 5.11 MIL/uL   Hemoglobin 8.3 (L) 12.0 -  15.0 g/dL   HCT 26.4 (L) 36 - 46 %   MCV 95.0 80.0 - 100.0 fL   MCH 29.9 26.0 - 34.0 pg   MCHC 31.4 30.0 - 36.0 g/dL   RDW 15.7 (H) 11.5 - 15.5 %   Platelets 299 150 - 400 K/uL   nRBC 0.2 0.0 - 0.2 %    Comment: Performed at [facility], [place] [address], [place], [place] [address]  Comprehensive metabolic panel     Status: Abnormal   Collection Time: 01/04/20  1:21 PM  Result Value Ref Range   Sodium 137 135 - 145 mmol/L   Potassium 4.3 3.5 - 5.1 mmol/L   Chloride 97 (L) 98 - 111 mmol/L   CO2 32 22 - 32 mmol/L   Glucose, Bld 85 70 - 99 mg/dL    Comment: Glucose reference range applies only to samples taken after fasting for at least 8 hours.   BUN 21 8 - 23 mg/dL   Creatinine, Ser 0.80 0.44 - 1.00 mg/dL   Calcium 9.3 8.9 - 10.3 mg/dL   Total Protein 5.7 (L) 6.5 - 8.1 g/dL   Albumin 2.3 (L) 3.5 - 5.0 g/dL   AST 14 (L) 15 - 41 U/L   ALT 13 0 - 44 U/L   Alkaline Phosphatase 85 38 - 126 U/L   Total Bilirubin 0.7 0.3 - 1.2 mg/dL   GFR, Estimated >60 >60 mL/min    Comment: (NOTE) Calculated using the CKD-EPI Creatinine Equation (2021)    Anion gap 8 5 - 15    Comment: Performed at [facility], [place] [address] [place], [place] [address]   DG Pelvis Portable  Result Date: 01/04/2020 CLINICAL DATA:  Possible left hip fracture. Fall down 4 stairs at home. Hip pain. EXAM: PORTABLE PELVIS 1-2 VIEWS COMPARISON:  None. FINDINGS: Acute oblique subtrochanteric fracture of the left femur with extension to involve the greater and lesser trochanters. There is foreshortening and approximately 1 shaft with medial displacement. The hips appear located on the single radiograph. Mild bilateral hip degenerative change. Osteopenia. Lower lumbar degenerative change. IMPRESSION: Acute oblique subtrochanteric fracture of the left femur with extension to involve the greater and lesser trochanters. There is foreshortening and approximately 1 shaft with medial  displacement. Electronically Signed   By: Margaretha Sheffield MD   On: 01/04/2020 13:10    ROS: Unable to ambulate after her fall  Usually walks without any assistance No previous left hip pain or symptoms  Physical Exam: Alert and appropriate 79 y/o female in no acute distress Cervical spine with full rom, no tenderness Bilateral upper extremity with full rom, no tenderness, no deformity nv intact distally bilaterally Left  lower extremity with shortening and external rotation Pelvis tender on left side Right lower extremity with full rom  No tenderness or deformity nv intact distally bilaterally No signs of open injury  No tenderness over the T or L spine  Assessment/Plan Assessment: left subtrochanteric femur fracture  Plan: Discussed case with Dr. Veverly Fells and Dr. Lyla Glassing Recommend operative management for the left femur fracture Pt and family in agreement All questions encouraged and answered Plan for IM nail with Dr. Lyla Glassing later this afternoon Keep NPO Strict bedrest Pain management as needed   Merla Riches PA-C, [place] [facility]  [place] [address], [address], [place], [place] [address] Phone: [phone] [email or web address] Facebook  [employer]     I have examined the patient and agree with the above assessment and plan.   Augustin Schooling MD

## 2020-01-04 NOTE — Anesthesia Preprocedure Evaluation (Signed)
Anesthesia Evaluation  General Assessment Comment:Pt looks Frail  Reviewed: Allergy & Precautions, Patient's Chart, lab work & pertinent test results  Airway        Dental   Pulmonary shortness of breath, COPD,  COPD inhaler, Current Smoker,           Cardiovascular Exercise Tolerance: Good negative cardio ROS       Neuro/Psych    GI/Hepatic Neg liver ROS,   Endo/Other  Hx of hyperparathyroidism  Renal/GU K+ 4.3 Cr 0.80     Musculoskeletal  (+) Arthritis , Rheumatoid disorders,    Abdominal   Peds  Hematology  (+) anemia , Hgb 8.3 Plt 299  T x C   Anesthesia Other Findings   Reproductive/Obstetrics                             Anesthesia Physical Anesthesia Plan  ASA: III and emergent  Anesthesia Plan: General   Post-op Pain Management:    Induction: Intravenous  PONV Risk Score and Plan: 3 and Treatment may vary due to age or medical condition and Ondansetron  Airway Management Planned: LMA  Additional Equipment: None  Intra-op Plan:   Post-operative Plan:   Informed Consent:     Dental advisory given  Plan Discussed with:   Anesthesia Plan Comments:         Anesthesia Quick Evaluation

## 2020-01-04 NOTE — ED Notes (Signed)
Pt placed on purewick at 60 mmHg. 

## 2020-01-05 ENCOUNTER — Inpatient Hospital Stay (HOSPITAL_COMMUNITY): Payer: Medicare Other | Admitting: Anesthesiology

## 2020-01-05 ENCOUNTER — Inpatient Hospital Stay (HOSPITAL_COMMUNITY): Payer: Medicare Other

## 2020-01-05 ENCOUNTER — Encounter (HOSPITAL_COMMUNITY): Payer: Self-pay | Admitting: Internal Medicine

## 2020-01-05 ENCOUNTER — Encounter (HOSPITAL_COMMUNITY)
Admission: EM | Disposition: A | Discharge status: Skilled Nursing Facility | Payer: Self-pay | Source: Home / Self Care | Attending: Internal Medicine

## 2020-01-05 HISTORY — PX: FEMUR IM NAIL: SHX1597

## 2020-01-05 LAB — BLOOD GAS, ARTERIAL
Acid-Base Excess: 3.2 mmol/L — ABNORMAL HIGH (ref 0.0–2.0)
Acid-Base Excess: 4.7 mmol/L — ABNORMAL HIGH (ref 0.0–2.0)
Bicarbonate: 31.9 mmol/L — ABNORMAL HIGH (ref 20.0–28.0)
Bicarbonate: 32.1 mmol/L — ABNORMAL HIGH (ref 20.0–28.0)
Drawn by: 560031
Drawn by: 11249
FIO2: 72
FIO2: 100
O2 Saturation: 96.5 %
O2 Saturation: 99.3 %
Patient temperature: 98.6
Patient temperature: 97.8
RATE: 12 resp/min
pCO2 arterial: 73 mmHg (ref 32.0–48.0)
pCO2 arterial: 64.9 mmHg — ABNORMAL HIGH (ref 32.0–48.0)
pH, Arterial: 7.263 — ABNORMAL LOW (ref 7.350–7.450)
pH, Arterial: 7.315 — ABNORMAL LOW (ref 7.350–7.450)
pO2, Arterial: 99 mmHg (ref 83.0–108.0)
pO2, Arterial: 241 mmHg — ABNORMAL HIGH (ref 83.0–108.0)

## 2020-01-05 LAB — COMPREHENSIVE METABOLIC PANEL
ALT: 13 U/L (ref 0–44)
AST: 13 U/L — ABNORMAL LOW (ref 15–41)
Albumin: 2 g/dL — ABNORMAL LOW (ref 3.5–5.0)
Alkaline Phosphatase: 66 U/L (ref 38–126)
Anion gap: 10 (ref 5–15)
BUN: 18 mg/dL (ref 8–23)
CO2: 29 mmol/L (ref 22–32)
Calcium: 9 mg/dL (ref 8.9–10.3)
Chloride: 97 mmol/L — ABNORMAL LOW (ref 98–111)
Creatinine, Ser: 0.75 mg/dL (ref 0.44–1.00)
GFR, Estimated: >60 mL/min (ref >60)
Glucose, Bld: 339 mg/dL — ABNORMAL HIGH (ref 70–99)
Potassium: 4.7 mmol/L (ref 3.5–5.1)
Sodium: 136 mmol/L (ref 135–145)
Total Bilirubin: 0.6 mg/dL (ref 0.3–1.2)
Total Protein: 5.2 g/dL — ABNORMAL LOW (ref 6.5–8.1)

## 2020-01-05 LAB — MAGNESIUM: Magnesium: 1.7 mg/dL (ref 1.7–2.4)

## 2020-01-05 LAB — PROCALCITONIN: Procalcitonin: 1.42 ng/mL

## 2020-01-05 LAB — PHOSPHORUS: Phosphorus: 3.5 mg/dL (ref 2.5–4.6)

## 2020-01-05 SURGERY — INSERTION, INTRAMEDULLARY ROD, FEMUR
Anesthesia: General | Laterality: Left

## 2020-01-05 SURGERY — FIXATION, FRACTURE, INTERTROCHANTERIC, WITH INTRAMEDULLARY ROD
Anesthesia: Choice | Laterality: Left

## 2020-01-05 MED ORDER — SUGAMMADEX SODIUM 200 MG/2ML IV SOLN
INTRAVENOUS | Status: DC | PRN
  Administered 2020-01-05: 80 mg via INTRAVENOUS

## 2020-01-05 MED ORDER — ONDANSETRON HCL 4 MG/2ML IJ SOLN: 4.0000 mg | Freq: Once | INTRAMUSCULAR | Status: DC | PRN

## 2020-01-05 MED ORDER — FENTANYL CITRATE (PF) 100 MCG/2ML IJ SOLN: 25.0000 ug | INTRAMUSCULAR | Status: DC | PRN

## 2020-01-05 MED ORDER — PHENYLEPHRINE HCL-NACL 10-0.9 MG/250ML-% IV SOLN
INTRAVENOUS | Status: DC | PRN
  Administered 2020-01-05: 50 ug/min via INTRAVENOUS

## 2020-01-05 MED ORDER — PHENYLEPHRINE 40 MCG/ML (10ML) SYRINGE FOR IV PUSH (FOR BLOOD PRESSURE SUPPORT)
PREFILLED_SYRINGE | INTRAVENOUS | Status: DC | PRN
  Administered 2020-01-05: 80 ug via INTRAVENOUS

## 2020-01-05 MED ORDER — IPRATROPIUM-ALBUTEROL 0.5-2.5 (3) MG/3ML IN SOLN
3.0000 mL | Freq: Four times a day (QID) | RESPIRATORY_TRACT | Status: DC
  Administered 2020-01-05: 3 mL via RESPIRATORY_TRACT

## 2020-01-05 MED ORDER — ADULT MULTIVITAMIN W/MINERALS CH
1.0000 | ORAL_TABLET | Freq: Every day | ORAL | Status: DC
  Administered 2020-01-06 – 2020-01-12 (×7): 1 via ORAL
  Filled 2020-01-05 (×9): qty 1

## 2020-01-05 MED ORDER — ALBUTEROL SULFATE HFA 108 (90 BASE) MCG/ACT IN AERS
INHALATION_SPRAY | RESPIRATORY_TRACT | Status: AC
  Filled 2020-01-05: qty 6.7

## 2020-01-05 MED ORDER — ONDANSETRON HCL 4 MG/2ML IJ SOLN
4.0000 mg | Freq: Four times a day (QID) | INTRAMUSCULAR | Status: DC | PRN
  Administered 2020-01-05: 4 mg via INTRAVENOUS
  Filled 2020-01-05: qty 2

## 2020-01-05 MED ORDER — DOCUSATE SODIUM 100 MG PO CAPS
100.0000 mg | ORAL_CAPSULE | Freq: Two times a day (BID) | ORAL | Status: DC
  Administered 2020-01-06 – 2020-01-12 (×13): 100 mg via ORAL
  Filled 2020-01-05 (×13): qty 1

## 2020-01-05 MED ORDER — ENSURE SURGERY PO LIQD
237.0000 mL | Freq: Two times a day (BID) | ORAL | Status: DC
  Administered 2020-01-06 – 2020-01-08 (×6): 237 mL via ORAL
  Filled 2020-01-05 (×6): qty 237

## 2020-01-05 MED ORDER — METOCLOPRAMIDE HCL 5 MG PO TABS: 5.0000 mg | ORAL_TABLET | Freq: Three times a day (TID) | ORAL | Status: DC | PRN

## 2020-01-05 MED ORDER — DEXAMETHASONE SODIUM PHOSPHATE 10 MG/ML IJ SOLN
INTRAMUSCULAR | Status: AC
  Filled 2020-01-05: qty 1

## 2020-01-05 MED ORDER — ALBUTEROL SULFATE HFA 108 (90 BASE) MCG/ACT IN AERS
INHALATION_SPRAY | RESPIRATORY_TRACT | Status: DC | PRN
  Administered 2020-01-05: 5 via RESPIRATORY_TRACT

## 2020-01-05 MED ORDER — DEXTROSE IN LACTATED RINGERS 5 % IV SOLN: INTRAVENOUS | Status: DC

## 2020-01-05 MED ORDER — SODIUM CHLORIDE 0.9 % IV SOLN: INTRAVENOUS | Status: DC | PRN

## 2020-01-05 MED ORDER — IPRATROPIUM-ALBUTEROL 0.5-2.5 (3) MG/3ML IN SOLN
3.0000 mL | Freq: Two times a day (BID) | RESPIRATORY_TRACT | Status: DC
  Administered 2020-01-05 – 2020-01-08 (×6): 3 mL via RESPIRATORY_TRACT
  Filled 2020-01-05 (×6): qty 3

## 2020-01-05 MED ORDER — PROPOFOL 10 MG/ML IV BOLUS
INTRAVENOUS | Status: AC
  Filled 2020-01-05: qty 20

## 2020-01-05 MED ORDER — ISOPROPYL ALCOHOL 70 % SOLN
Status: AC
  Filled 2020-01-05: qty 480

## 2020-01-05 MED ORDER — ACETAMINOPHEN 10 MG/ML IV SOLN: 1000.0000 mg | Freq: Once | INTRAVENOUS | Status: DC | PRN

## 2020-01-05 MED ORDER — ONDANSETRON HCL 4 MG/2ML IJ SOLN
INTRAMUSCULAR | Status: DC | PRN
  Administered 2020-01-05: 4 mg via INTRAVENOUS

## 2020-01-05 MED ORDER — ORAL CARE MOUTH RINSE: 15.0000 mL | Freq: Once | OROMUCOSAL | Status: AC

## 2020-01-05 MED ORDER — SODIUM CHLORIDE 0.9 % IR SOLN
Status: DC | PRN
  Administered 2020-01-05: 1000 mL

## 2020-01-05 MED ORDER — SENNA 8.6 MG PO TABS
1.0000 | ORAL_TABLET | Freq: Two times a day (BID) | ORAL | Status: DC
  Administered 2020-01-06 – 2020-01-12 (×12): 8.6 mg via ORAL
  Filled 2020-01-05 (×13): qty 1

## 2020-01-05 MED ORDER — IPRATROPIUM-ALBUTEROL 20-100 MCG/ACT IN AERS: 2.0000 | INHALATION_SPRAY | Freq: Four times a day (QID) | RESPIRATORY_TRACT | Status: DC

## 2020-01-05 MED ORDER — ONDANSETRON HCL 4 MG/2ML IJ SOLN
INTRAMUSCULAR | Status: AC
  Filled 2020-01-05: qty 2

## 2020-01-05 MED ORDER — CHLORHEXIDINE GLUCONATE CLOTH 2 % EX PADS
6.0000 | MEDICATED_PAD | Freq: Every day | CUTANEOUS | Status: DC
  Administered 2020-01-06 – 2020-01-12 (×7): 6 via TOPICAL

## 2020-01-05 MED ORDER — FERROUS SULFATE 325 (65 FE) MG PO TABS: 325.0000 mg | ORAL_TABLET | Freq: Two times a day (BID) | ORAL | Status: DC

## 2020-01-05 MED ORDER — ASCORBIC ACID 500 MG PO TABS
500.0000 mg | ORAL_TABLET | Freq: Two times a day (BID) | ORAL | Status: DC
  Administered 2020-01-06 – 2020-01-12 (×13): 500 mg via ORAL
  Filled 2020-01-05 (×13): qty 1

## 2020-01-05 MED ORDER — METHOCARBAMOL 500 MG PO TABS: 500.0000 mg | ORAL_TABLET | Freq: Four times a day (QID) | ORAL | Status: DC | PRN

## 2020-01-05 MED ORDER — CHLORHEXIDINE GLUCONATE 0.12 % MT SOLN
15.0000 mL | Freq: Once | OROMUCOSAL | Status: AC
  Administered 2020-01-05: 15 mL via OROMUCOSAL

## 2020-01-05 MED ORDER — METOCLOPRAMIDE HCL 5 MG/ML IJ SOLN: 5.0000 mg | Freq: Three times a day (TID) | INTRAMUSCULAR | Status: DC | PRN

## 2020-01-05 MED ORDER — FERROUS SULFATE 325 (65 FE) MG PO TABS
325.0000 mg | ORAL_TABLET | Freq: Two times a day (BID) | ORAL | Status: DC
  Administered 2020-01-06 – 2020-01-12 (×13): 325 mg via ORAL
  Filled 2020-01-05 (×13): qty 1

## 2020-01-05 MED ORDER — FENTANYL CITRATE (PF) 100 MCG/2ML IJ SOLN
INTRAMUSCULAR | Status: AC
  Filled 2020-01-05: qty 2

## 2020-01-05 MED ORDER — SODIUM CHLORIDE 0.9% FLUSH
10.0000 mL | Freq: Two times a day (BID) | INTRAVENOUS | Status: DC
  Administered 2020-01-05 – 2020-01-12 (×11): 10 mL

## 2020-01-05 MED ORDER — MENTHOL 3 MG MT LOZG: 1.0000 | LOZENGE | OROMUCOSAL | Status: DC | PRN

## 2020-01-05 MED ORDER — BOOST / RESOURCE BREEZE PO LIQD CUSTOM
1.0000 | ORAL | Status: DC
  Administered 2020-01-06 – 2020-01-07 (×2): 1 via ORAL

## 2020-01-05 MED ORDER — HYDROCODONE-ACETAMINOPHEN 5-325 MG PO TABS
1.0000 | ORAL_TABLET | ORAL | Status: DC | PRN
  Filled 2020-01-05: qty 2
  Filled 2020-01-05: qty 1

## 2020-01-05 MED ORDER — ONDANSETRON HCL 4 MG PO TABS: 4.0000 mg | ORAL_TABLET | Freq: Four times a day (QID) | ORAL | Status: DC | PRN

## 2020-01-05 MED ORDER — CEFAZOLIN SODIUM-DEXTROSE 2-3 GM-%(50ML) IV SOLR
INTRAVENOUS | Status: DC | PRN
  Administered 2020-01-05: 2 g via INTRAVENOUS

## 2020-01-05 MED ORDER — PHENOL 1.4 % MT LIQD
1.0000 | OROMUCOSAL | Status: DC | PRN
  Filled 2020-01-05: qty 177

## 2020-01-05 MED ORDER — SODIUM CHLORIDE 0.9% FLUSH
10.0000 mL | INTRAVENOUS | Status: DC | PRN
  Administered 2020-01-08: 10 mL

## 2020-01-05 MED ORDER — DEXAMETHASONE SODIUM PHOSPHATE 10 MG/ML IJ SOLN
INTRAMUSCULAR | Status: DC | PRN
  Administered 2020-01-05: 4 mg via INTRAVENOUS

## 2020-01-05 MED ORDER — MORPHINE SULFATE (PF) 2 MG/ML IV SOLN
0.5000 mg | INTRAVENOUS | Status: DC | PRN
  Administered 2020-01-05: 1 mg via INTRAVENOUS
  Filled 2020-01-05: qty 1

## 2020-01-05 MED ORDER — ROCURONIUM BROMIDE 100 MG/10ML IV SOLN
INTRAVENOUS | Status: DC | PRN
  Administered 2020-01-05: 10 mg via INTRAVENOUS
  Administered 2020-01-05: 40 mg via INTRAVENOUS

## 2020-01-05 MED ORDER — LABETALOL HCL 5 MG/ML IV SOLN
INTRAVENOUS | Status: AC
  Filled 2020-01-05: qty 4

## 2020-01-05 MED ORDER — SUCCINYLCHOLINE CHLORIDE 20 MG/ML IJ SOLN
INTRAMUSCULAR | Status: DC | PRN
  Administered 2020-01-05: 60 mg via INTRAVENOUS

## 2020-01-05 MED ORDER — CEFAZOLIN SODIUM-DEXTROSE 2-4 GM/100ML-% IV SOLN
INTRAVENOUS | Status: AC
  Filled 2020-01-05: qty 100

## 2020-01-05 MED ORDER — LACTATED RINGERS IV SOLN: INTRAVENOUS | Status: DC

## 2020-01-05 MED ORDER — FENTANYL CITRATE (PF) 100 MCG/2ML IJ SOLN
INTRAMUSCULAR | Status: DC | PRN
  Administered 2020-01-05 (×2): 25 ug via INTRAVENOUS

## 2020-01-05 MED ORDER — ENOXAPARIN SODIUM 30 MG/0.3ML ~~LOC~~ SOLN
30.0000 mg | SUBCUTANEOUS | Status: DC
  Administered 2020-01-06 – 2020-01-10 (×5): 30 mg via SUBCUTANEOUS
  Filled 2020-01-05 (×5): qty 0.3

## 2020-01-05 MED ORDER — HYDROCODONE-ACETAMINOPHEN 7.5-325 MG PO TABS
1.0000 | ORAL_TABLET | ORAL | Status: DC | PRN
  Administered 2020-01-06 – 2020-01-07 (×4): 1 via ORAL
  Administered 2020-01-11: 2 via ORAL
  Administered 2020-01-11 – 2020-01-12 (×2): 1 via ORAL
  Filled 2020-01-05 (×6): qty 1

## 2020-01-05 MED ORDER — ORAL CARE MOUTH RINSE
15.0000 mL | Freq: Two times a day (BID) | OROMUCOSAL | Status: DC
  Administered 2020-01-06 – 2020-01-12 (×13): 15 mL via OROMUCOSAL

## 2020-01-05 MED ORDER — LIDOCAINE 2% (20 MG/ML) 5 ML SYRINGE
INTRAMUSCULAR | Status: DC | PRN
  Administered 2020-01-05: 60 mg via INTRAVENOUS

## 2020-01-05 MED ORDER — ACETAMINOPHEN 325 MG PO TABS
325.0000 mg | ORAL_TABLET | Freq: Four times a day (QID) | ORAL | Status: DC | PRN
  Administered 2020-01-11 – 2020-01-12 (×2): 650 mg via ORAL
  Filled 2020-01-05 (×2): qty 2

## 2020-01-05 MED ORDER — ZINC GLUCONATE 50 MG PO TABS: 50.0000 mg | ORAL_TABLET | Freq: Every day | ORAL | Status: DC

## 2020-01-05 MED ORDER — MOMETASONE FURO-FORMOTEROL FUM 100-5 MCG/ACT IN AERO
2.0000 | INHALATION_SPRAY | Freq: Two times a day (BID) | RESPIRATORY_TRACT | Status: DC
  Administered 2020-01-06 – 2020-01-12 (×11): 2 via RESPIRATORY_TRACT
  Filled 2020-01-05: qty 8.8

## 2020-01-05 MED ORDER — LACTATED RINGERS IV SOLN: INTRAVENOUS | Status: DC | PRN

## 2020-01-05 MED ORDER — LABETALOL HCL 5 MG/ML IV SOLN
5.0000 mg | Freq: Once | INTRAVENOUS | Status: AC
  Administered 2020-01-05: 5 mg via INTRAVENOUS

## 2020-01-05 MED ORDER — METHOCARBAMOL 500 MG IVPB - SIMPLE MED
500.0000 mg | Freq: Four times a day (QID) | INTRAVENOUS | Status: DC | PRN
  Filled 2020-01-05: qty 50

## 2020-01-05 MED ORDER — PROPOFOL 10 MG/ML IV BOLUS
INTRAVENOUS | Status: DC | PRN
  Administered 2020-01-05: 20 mg via INTRAVENOUS
  Administered 2020-01-05: 50 mg via INTRAVENOUS

## 2020-01-05 MED ORDER — CEFAZOLIN SODIUM-DEXTROSE 2-4 GM/100ML-% IV SOLN
2.0000 g | Freq: Four times a day (QID) | INTRAVENOUS | Status: AC
  Administered 2020-01-05 – 2020-01-06 (×2): 2 g via INTRAVENOUS
  Filled 2020-01-05 (×2): qty 100

## 2020-01-05 SURGICAL SUPPLY — 51 items
BAG ZIPLOCK 12X15 (MISCELLANEOUS) IMPLANT
BIT DRILL 4.3MMS DISTAL GRDTED (BIT) IMPLANT
CHLORAPREP W/TINT 26 (MISCELLANEOUS) ×2 IMPLANT
COVER PERINEAL POST (MISCELLANEOUS) ×2 IMPLANT
COVER SURGICAL LIGHT HANDLE (MISCELLANEOUS) ×2 IMPLANT
COVER WAND RF STERILE (DRAPES) IMPLANT
DERMABOND ADVANCED (GAUZE/BANDAGES/DRESSINGS) ×1
DERMABOND ADVANCED .7 DNX12 (GAUZE/BANDAGES/DRESSINGS) ×1 IMPLANT
DRAPE C-ARM 42X120 X-RAY (DRAPES) ×2 IMPLANT
DRAPE C-ARMOR (DRAPES) ×2 IMPLANT
DRAPE SHEET LG 3/4 BI-LAMINATE (DRAPES) ×2 IMPLANT
DRAPE STERI IOBAN 125X83 (DRAPES) ×2 IMPLANT
DRAPE U-SHAPE 47X51 STRL (DRAPES) ×4 IMPLANT
DRILL 4.3MMS DISTAL GRADUATED (BIT) ×2
DRSG AQUACEL AG ADV 3.5X 4 (GAUZE/BANDAGES/DRESSINGS) ×1 IMPLANT
DRSG AQUACEL AG ADV 3.5X 6 (GAUZE/BANDAGES/DRESSINGS) ×2 IMPLANT
DRSG AQUACEL AG ADV 3.5X10 (GAUZE/BANDAGES/DRESSINGS) ×2 IMPLANT
FACESHIELD WRAPAROUND (MASK) ×6 IMPLANT
FACESHIELD WRAPAROUND OR TEAM (MASK) ×3 IMPLANT
GAUZE SPONGE 4X4 12PLY STRL (GAUZE/BANDAGES/DRESSINGS) ×2 IMPLANT
GLOVE BIO SURGEON STRL SZ8.5 (GLOVE) ×4 IMPLANT
GLOVE BIOGEL M 8.0 STRL (GLOVE) ×6 IMPLANT
GLOVE BIOGEL PI IND STRL 8 (GLOVE) ×1 IMPLANT
GLOVE BIOGEL PI IND STRL 8.5 (GLOVE) ×1 IMPLANT
GLOVE BIOGEL PI INDICATOR 8 (GLOVE) ×1
GLOVE BIOGEL PI INDICATOR 8.5 (GLOVE) ×1
GLOVE INDICATOR 7.5 STRL GRN (GLOVE) ×2 IMPLANT
GOWN SPEC L3 XXLG W/TWL (GOWN DISPOSABLE) ×2 IMPLANT
GOWN STRL REUS W/TWL XL LVL3 (GOWN DISPOSABLE) ×2 IMPLANT
GUIDEPIN 3.2X17.5 THRD DISP (PIN) ×2 IMPLANT
GUIDEWIRE BALL NOSE 100CM (WIRE) ×1 IMPLANT
HIP FRA NAIL LAG SCREW 10.5X90 (Orthopedic Implant) ×2 IMPLANT
HIP FRAC NAIL LEFT 11X360MM (Orthopedic Implant) ×2 IMPLANT
KIT BASIN OR (CUSTOM PROCEDURE TRAY) ×2 IMPLANT
KIT TURNOVER KIT A (KITS) IMPLANT
MANIFOLD NEPTUNE II (INSTRUMENTS) ×2 IMPLANT
MARKER SKIN DUAL TIP RULER LAB (MISCELLANEOUS) ×2 IMPLANT
NAIL HIP FRAC LEFT 11X360MM (Orthopedic Implant) IMPLANT
PACK TOTAL JOINT (CUSTOM PROCEDURE TRAY) ×2 IMPLANT
PENCIL SMOKE EVACUATOR (MISCELLANEOUS) IMPLANT
SCREW BONE CORTICAL 5.0X3 (Screw) ×1 IMPLANT
SCREW BONE CORTICAL 5.0X30 (Screw) ×1 IMPLANT
SCREW LAG HIP FRA NAIL 10.5X90 (Orthopedic Implant) IMPLANT
SUT MNCRL AB 3-0 PS2 18 (SUTURE) IMPLANT
SUT MON AB 2-0 CT1 36 (SUTURE) ×2 IMPLANT
SUT VIC AB 1 CT1 27 (SUTURE) ×1
SUT VIC AB 1 CT1 27XBRD ANTBC (SUTURE) ×1 IMPLANT
TOWEL OR 17X26 10 PK STRL BLUE (TOWEL DISPOSABLE) ×2 IMPLANT
TOWEL OR NON WOVEN STRL DISP B (DISPOSABLE) ×2 IMPLANT
TRAY FOLEY MTR SLVR 14FR STAT (SET/KITS/TRAYS/PACK) ×2 IMPLANT
YANKAUER SUCT BULB TIP NO VENT (SUCTIONS) ×2 IMPLANT

## 2020-01-05 NOTE — Progress Notes (Signed)
PACU note: Left hip fracture s/p IM fixation of Left femur by Dr. Lyla Glassing.  On 2 L oxygen by nasal cannula at baseline Became acutely hypoxic after surgery/ respiratory distress CRNA Beverlee Nims) at bedside with Dr. Gloris Manchester. Insertion of nasal trumpet performed and patient placed on 10L NRB  Dr. Lyla Glassing and hospitalist team (Dr. Irene Pap) informed of patient's respiratory status post-operatively. Chest xray, ABG, labs and & Step-down bed requested by Dr. Nevada Crane.  ABG revealing pH 7.2 and PCO2 of 73 (notified RN); BiPAP when more alert.  Report given to Cristie Hem, RN and family updated.  Eugene Garnet, RN

## 2020-01-05 NOTE — Interval H&P Note (Signed)
History and Physical Interval Note:  01/05/2020 11:14 AM  Olivia Hooper  has presented today for surgery, with the diagnosis of left hip fracture.  The various methods of treatment have been discussed with the patient and family. After consideration of risks, benefits and other options for treatment, the patient has consented to  Procedure(s): INTRAMEDULLARY (IM) NAIL LEFT  FEMORAL (Left) as a surgical intervention.  The patient's history has been reviewed, patient examined, no change in status, stable for surgery.  I have reviewed the patient's chart and labs.  Questions were answered to the patient's satisfaction.    The risks, benefits, and alternatives were discussed with the patient. There are risks associated with the surgery including, but not limited to, problems with anesthesia (death), infection, differences in leg length/angulation/rotation, fracture of bones, loosening or failure of implants, malunion, nonunion, hematoma (blood accumulation) which may require surgical drainage, blood clots, pulmonary embolism, nerve injury (foot drop), and blood vessel injury. The patient understands these risks and elects to proceed.    Hilton Cork Tannon Peerson

## 2020-01-05 NOTE — Discharge Instructions (Signed)
Dr. Rod Can Adult Hip & Knee Specialist [facility] [address] [place], [place] [address] [phone]   POSTOPERATIVE DIRECTIONS    Hip Rehabilitation, Guidelines Following Surgery   WEIGHT BEARING Other:  weight bearing as tolerated for transfers only   [place] items at home which could result in a fall. This includes throw rugs or furniture in walking pathways.  Continue medications as instructed at time of discharge.  You may have some home medications which will be placed on hold until you complete the course of blood thinner medication.  4 days after discharge, you may start showering. No tub baths or soaking your incisions. Do not put on socks or shoes without following the instructions of your caregivers.   Sit on chairs with arms. Use the chair arms to help push yourself up when arising.  Arrange for the use of a toilet seat elevator so you are not sitting low.   Walk with walker as instructed.  You may resume a sexual relationship in one month or when given the OK by your caregiver.  Use walker as long as suggested by your caregivers.  Avoid periods of inactivity such as sitting longer than an hour when not asleep. This helps prevent blood clots.  You may return to work once you are cleared by Engineer, production.  Do not drive a car for 6 weeks or until released by your surgeon.  Do not drive while taking narcotics.  Wear elastic stockings for two weeks following surgery during the day but you may remove then at night.  Make sure you keep all of your appointments after your operation with all of your doctors and caregivers. You should call the office at the above phone number and make an appointment for approximately two weeks after the date of your surgery. Please pick up a stool softener and laxative for home use as long as you are requiring pain medications.  ICE to the affected hip every three hours for 30  minutes at a time and then as needed for pain and swelling. Continue to use ice on the hip for pain and swelling from surgery. You may notice swelling that will progress down to the foot and ankle.  This is normal after surgery.  Elevate the leg when you are not up walking on it.   It is important for you to complete the blood thinner medication as prescribed by your doctor.  Continue to use the breathing machine which will help keep your temperature down.  It is common for your temperature to cycle up and down following surgery, especially at night when you are not up moving around and exerting yourself.  The breathing machine keeps your lungs expanded and your temperature down.  RANGE OF MOTION AND STRENGTHENING EXERCISES  These exercises are designed to help you keep full movement of your hip joint. Follow your caregiver's or physical therapist's instructions. Perform all exercises about fifteen times, three times per day or as directed. Exercise both hips, even if you have had only one joint replacement. These exercises can be done on a training (exercise) mat, on the floor, on a table or on a bed. Use whatever works the best and is most comfortable for you. Use music or television while you are exercising so that the exercises are a pleasant break in your day. This will make your life better with the exercises acting as a break in routine you can look forward to.  Lying on your back, slowly slide your foot toward your buttocks, raising your knee up off the floor. Then slowly slide your foot back down until your leg is straight again.  Lying on your back spread your legs as far apart as you can without causing discomfort.  Lying on your side, raise your upper leg and foot straight up from the floor as far as is comfortable. Slowly lower the leg and repeat.  Lying on your back, tighten up the muscle in the front of your thigh (quadriceps muscles). You can do this by keeping your leg straight and trying  to raise your heel off the floor. This helps strengthen the largest muscle supporting your knee.  Lying on your back, tighten up the muscles of your buttocks both with the legs straight and with the knee bent at a comfortable angle while keeping your heel on the floor.   SKILLED REHAB INSTRUCTIONS: If the patient is transferred to a skilled rehab facility following release from the hospital, a list of the current medications will be sent to the facility for the patient to continue.  When discharged from the skilled rehab facility, please have the facility set up the patient's [place] prior to being released. Also, the skilled facility will be responsible for providing the patient with their medications at time of release from the facility to include their pain medication and their blood thinner medication. If the patient is still at the rehab facility at time of the two week follow up appointment, the skilled rehab facility will also need to assist the patient in arranging follow up appointment in our office and any transportation needs.  MAKE SURE YOU:  Understand these instructions.  Will watch your condition.  Will get help right away if you are not doing well or get worse.  Pick up stool softner and laxative for home use following surgery while on pain medications. Daily dry dressing changes as needed. In 4 days, you may remove your dressings and begin taking showers - no tub baths or soaking the incisions. Continue to use ice for pain and swelling after surgery. Do not use any lotions or creams on the incision until instructed by your surgeon.

## 2020-01-05 NOTE — Anesthesia Procedure Notes (Signed)
Procedure Name: Intubation Date/Time: 01/05/2020 11:31 AM Performed by: Sharlette Dense, CRNA Patient Re-evaluated:Patient Re-evaluated prior to induction Oxygen Delivery Method: Circle system utilized Preoxygenation: Pre-oxygenation with 100% oxygen Induction Type: IV induction Ventilation: Mask ventilation without difficulty Laryngoscope Size: Miller and 2 Grade View: Grade I Tube type: Oral Tube size: 7.0 mm Number of attempts: 1 Airway Equipment and Method: Stylet Placement Confirmation: ETT inserted through vocal cords under direct vision,  positive ETCO2 and breath sounds checked- equal and bilateral Secured at: 20 cm Tube secured with: Tape Dental Injury: Teeth and Oropharynx as per pre-operative assessment

## 2020-01-05 NOTE — Anesthesia Preprocedure Evaluation (Addendum)
Anesthesia Evaluation  Patient identified by MRN, date of birth, ID band Patient awake    Reviewed: Patient's Chart, lab work & pertinent test results  Airway Mallampati: II  TM Distance: >3 FB Neck ROM: Full    Dental  (+) Edentulous Upper, Edentulous Lower   Pulmonary COPD,  COPD inhaler and oxygen dependent, Current Smoker and Patient abstained from smoking.,    Pulmonary exam normal        Cardiovascular hypertension, Pt. on medications  Rhythm:Regular Rate:Normal     Neuro/Psych negative psych ROS   GI/Hepatic negative GI ROS, Neg liver ROS,   Endo/Other  hyperparathyroidism  Renal/GU negative Renal ROS  negative genitourinary   Musculoskeletal  (+) Arthritis , Rheumatoid disorders,    Abdominal (+)  Abdomen: soft. Bowel sounds: normal.  Peds  Hematology  (+) anemia ,   Anesthesia Other Findings   Reproductive/Obstetrics                            Anesthesia Physical Anesthesia Plan  ASA: III  Anesthesia Plan: General   Post-op Pain Management:    Induction: Intravenous  PONV Risk Score and Plan: 2 and Ondansetron and Treatment may vary due to age or medical condition  Airway Management Planned: Mask and Oral ETT  Additional Equipment: None  Intra-op Plan:   Post-operative Plan: Extubation in OR  Informed Consent: I have reviewed the patients History and Physical, chart, labs and discussed the procedure including the risks, benefits and alternatives for the proposed anesthesia with the patient or authorized representative who has indicated his/her understanding and acceptance.     Dental advisory given  Plan Discussed with: CRNA  Anesthesia Plan Comments: (Lab Results      Component                Value               Date                      WBC                      12.4 (H)            01/04/2020                HGB                      8.3 (L)              01/04/2020                HCT                      26.4 (L)            01/04/2020                MCV                      95.0                01/04/2020                PLT                      299  01/04/2020          )       Anesthesia Quick Evaluation

## 2020-01-05 NOTE — Plan of Care (Signed)
Plan of care reviewed and discussed with the patient. 

## 2020-01-05 NOTE — Op Note (Signed)
OPERATIVE REPORT  SURGEON: Rod Can, MD   ASSISTANT: Cherlynn June, PA-C.  PREOPERATIVE DIAGNOSIS: Comminuted reverse obliquity Left intertrochanteric femur fracture.   POSTOPERATIVE DIAGNOSIS: Comminuted reverse obliquity Left intertrochanteric femur fracture.   PROCEDURE: Intramedullary fixation, Left femur.   IMPLANTS: Biomet Affixus Hip Fracture Nail, 11 by 360 mm, 130 degrees. 10.5 x 90 mm Hip Fracture Nail Lag Screw. 5 x 34 mm distal interlocking screw 1.  ANESTHESIA:  General  ESTIMATED BLOOD LOSS:-50 mL    BLOOD PRODUCTS: 2 units PRBCs.  ANTIBIOTICS: 2 g Ancef.  DRAINS: None.  COMPLICATIONS: None.   CONDITION: PACU - hemodynamically stable.   BRIEF CLINICAL NOTE: Olivia Hooper is a 79 y.o. female who presented with an intertrochanteric femur fracture. The patient was admitted to the hospitalist service and underwent perioperative risk stratification and medical optimization. The risks, benefits, and alternatives to the procedure were explained, and the patient elected to proceed.  PROCEDURE IN DETAIL: Surgical site was marked by myself. The patient was taken to the operating room and anesthesia was induced on the bed. The patient was then transferred to the [facility] table and the nonoperative lower extremity was scissored underneath the operative side. The fracture was reduced with traction, internal rotation, and adduction. A crutch was required to correct posterior sag of the femur. The hip was prepped and draped in the normal sterile surgical fashion. Timeout was called verifying side and site of surgery. Preop antibiotics were given with 60 minutes of beginning the procedure.  Fluoroscopy was used to define the patient's anatomy. A 4 cm incision was made just proximal to the tip of the greater trochanter. The awl was used to obtain the standard starting point for a trochanteric entry nail under fluoroscopic control. The guidepin was placed. The entry reamer was used  to open the proximal femur.  I placed the guidewire to the level of the physeal scar of the knee. I measured the length of the guidewire. Sequential reaming was performed up to a size 12.5 mm with excellent chatter. Therefore, a size 11 by 360 mm nail was selected and assembled to the jig on the back table. The nail was placed without any difficulty, however reduction was lost due to the comminuted nature of the fracture. Therefore, the nail was removed. A stab incision was made on the anterior aspect of the greater trochanter. A bone hook was subperiosteally placed onto the inferior femoral neck to hold the reduction. The nail was placed again, and an acceptable reduction was maintained. Through a separate stab incision, the cannula was placed down to the bone in preparation for the cephalomedullary device. A guidepin was placed into the femoral head using AP and lateral fluoroscopy views. The pin was measured, and then reaming was performed to the appropriate depth. The lag screw was inserted to the appropriate depth. The fracture was compressed through the jig. The setscrew was tightened statically. Traction was released. Using perfect circle technique, a distal interlocking screw was placed. The jig was removed. Final AP and lateral fluoroscopy views were obtained to confirm fracture reduction and hardware placement. Tip apex distance was appropriate. There was no chondral penetration.  The wounds were copiously irrigated with saline. The wound was closed in layers with #1 Vicryl for the fascia, 2-0 Monocryl for the deep dermal layer, and 3-0 Monocryl subcuticular stitch. Glue was applied to the skin. Once the glue was fully hardened, sterile dressing was applied. The patient was then awakened from anesthesia and taken to the PACU in  stable condition. Sponge needle and instrument counts were correct at the end of the case 2. There were no known complications.  We will readmit the patient to the  hospitalist. Weightbearing status will be weightbearing as tolerated with a walker for transfers. We will begin Lovenox for DVT prophylaxis. The patient will work with physical therapy and undergo disposition planning.  Please note that a surgical assistant was a medical necessity for this procedure to perform it in a safe and expeditious manner. Assistant was necessary to provide appropriate retraction of vital neurovascular structures, to prevent femoral fracture, and to allow for anatomic placement of the prosthesis.

## 2020-01-05 NOTE — Progress Notes (Signed)
Initial Nutrition Assessment  DOCUMENTATION CODES:   Underweight  INTERVENTION:  - will order Boost Breeze once/day, each supplement provides 250 kcal and 9 grams of protein. - will order Ensure Surgery BID, each supplement provides 330 kcal and 18 grams protein. - will order 1 tablet multivitamin with minerals/day. - will complete NFPE at follow-up.   NUTRITION DIAGNOSIS:   Increased nutrient needs related to post-op healing as evidenced by estimated needs.  GOAL:   Patient will meet greater than or equal to 90% of their needs  MONITOR:   Diet advancement, PO intake, Supplement acceptance, Labs, Weight trends, Skin  REASON FOR ASSESSMENT:   Malnutrition Screening Tool, Consult Assessment of nutrition requirement/status  ASSESSMENT:   79 year-old female with medical history of RA, HLD, vertigo, COPD, osteoporosis, vitamin D deficiency, hypercalcemia, breast cancer, and anemia. She presented to the ED after a fall in which she fell down several steps. She had associated L hip pain after the fall.  Diet advanced from NPO to Regular yesterday at 1800 and then back to NPO at midnight. No intakes documented from dinner last night.   She was taken to pre-op at ~0930 this AM and has been out of the room since that time.   Weight yesterday was 79 lb and weight on 08/21/19 was 84 lb. This indicates 5 lb weight loss (6% body weight) in the past 4.5 months; not significant for time frame. Weight on 06/13/19 was 91 lb which indicates 12 lb weight loss (13% body weight) in the past ~7 months; significant for time frame.   Per notes: - L subtrochanteric femur fx - underwent IM nail to L femoral area today    Labs reviewed; Cl: 97 mmol/l. Medications reviewed; 325 mg ferrous sulfate BID.    NUTRITION - FOCUSED PHYSICAL EXAM:  unable to complete at this time.   Diet Order:   Diet Order            Diet regular Room service appropriate? Yes; Fluid consistency: Thin  Diet effective  now                 EDUCATION NEEDS:   No education needs have been identified at this time  Skin:  Skin Assessment: Skin Integrity Issues: Skin Integrity Issues:: Stage I, Incisions Stage I: R buttock Incisions: L hip (10/29)  Last BM:  10/28 (?)  Height:   Ht Readings from Last 1 Encounters:  01/04/20 5\' 1"  (1.549 m)    Weight:   Wt Readings from Last 1 Encounters:  01/04/20 36 kg    Estimated Nutritional Needs:  Kcal:  1500-1700 kcal Protein:  65-75 grams Fluid:  >/= 1.6 L/day      Jarome Matin, MS, RD, LDN, CNSC Inpatient Clinical Dietitian RD pager # available in AMION  After hours/weekend pager # available in [facility]

## 2020-01-05 NOTE — Plan of Care (Signed)

## 2020-01-05 NOTE — Progress Notes (Signed)
PROGRESS NOTE  Olivia Hooper YDX:[id number] DOB: January 31, 1941 DOA: 01/04/2020 PCP: [name], MD  HPI/Recap of past 24 hours: This is an 79 year old female with past medical history of chronic hypoxic respiratory failure on 2 L at baseline, COPD, rheumatoid arthritis, left breast cancer status post mastectomy, osteoporosis, hypertension, hyperlipidemia, Mycobacterium abscessus lung infection s/p treatment who presented to the ED with chief complaint of fall.  She went to walk outside and fell down approximately 4 steps onto her left hip onto the concrete.  Believes that she tripped over some carpeting which made her fall.  No loss of consciousness, did not hit her head, not on blood thinners.  No prodromal symptoms.  No other complaints at this time.  Currently has some left hip discomfort but otherwise no issues  ED Course: Afebrile and hemodynamically stable on baseline 2 LPM.  Labs notable for leukocytosis Hb 8.3 (previously 9.7 in April).  Left hip x-ray positive for left femur subtrochanteric fracture with extension to greater and lesser trochanters.  Orthopedic surgery was consulted by the ED.  01/05/20: Some hypoxia post surgery, improved on nonrebreather, likely secondary to atelectasis seen on chest x-ray.  ABG revealed severely hypercarbia with pH of 7.2 and PCO2 of 73.  We will keep on BiPAP at night when she is more alert.  Assessment/Plan: Principal Problem:   Closed left hip fracture, initial encounter [facility]) Active Problems:   COPD GOLD II / still smoking    Rheumatoid arthritis ([place])   Pulmonary mycobacterial infection ([place])   Anemia  Left hip fracture status post intramedullary fixation of left femur on 01/05/2020 by Dr. Lyla Glassing Pain control DVT prophylaxis Monitor H&H PT OT assessment with orthopedic surgeries guidance Fall precautions [facility] consult for possible SNF placement  Acute on chronic hypoxic hypercarbic respiratory failure On 2 L oxygen by nasal cannula at  baseline Became acutely hypoxic after surgery ABG revealing pH 7.2 and PCO2 of 73 BiPAP when more alert Repeat ABG or VBG to assess pH and PCO2 post BiPAP. Continue to closely monitor Maintain oxygen saturation greater than 90%  Iron deficiency anemia Iron studies consistent with severe iron deficiency Start ferrous sulfate 325 mg twice daily Transfuse 2 unit PRBC perioperatively Continue to monitor H&H  History of mycoplasma/right upper lobe lung abscesses Follows with ID Monitor  COPD Resume home bronchodilators Continue continuous oxygen supplementation  Severe protein calorie malnutrition BMI 50 Albumin 2.3 Oral supplement Dietitian consult    Code Status: Full code  Family Communication: None at bedside  Disposition Plan: Likely to SNF, pending PT OT assessment   Consultants:  Orthopedics  Procedures:  Left hip repair on 01/05/2020 by Dr. Lyla Glassing.  Antimicrobials:  None  DVT prophylaxis: Subcu Lovenox daily  Status is: Inpatient   Dispo:  Patient From: Home  Planned Disposition: [place]  Expected discharge date: 01/07/20  Medically stable for discharge: No, ongoing management of medical condition perioperatively.         Objective: Vitals:   01/05/20 1515 01/05/20 1530 01/05/20 1545 01/05/20 1600  BP: (!) 132/56 137/63 (!) 144/67 (!) 113/58  Pulse: 79 80 81 81  Resp: (!) 21 15 18 20   Temp:      TempSrc:      SpO2: 100% 99% 97% 97%  Weight:      Height:        Intake/Output Summary (Last 24 hours) at 01/05/2020 1621 Last data filed at 01/05/2020 1419 Gross per 24 hour  Intake 2390.83 ml  Output 325  ml  Net 2065.83 ml   Filed Weights   01/04/20 1227  Weight: 36 kg    Exam:  . General: 79 y.o. year-old female well developed well nourished in no acute distress.  Somnolent but easily arousable to voices. . Cardiovascular: Regular rate and rhythm with no rubs or gallops.  No thyromegaly or JVD noted.    Marland Kitchen Respiratory: Mild rales diffusely.  No wheezing noted.  Poor inspiratory effort. . Abdomen: Soft nontender nondistended with normal bowel sounds x4 quadrants. . Musculoskeletal: No lower extremity edema bilaterally. Marland Kitchen Psychiatry: Mood is appropriate for condition and setting   Data Reviewed: CBC: Recent Labs  Lab 01/04/20 1321  WBC 12.4*  HGB 8.3*  HCT 26.4*  MCV 95.0  PLT 161   Basic Metabolic Panel: Recent Labs  Lab 01/04/20 1321  NA 137  K 4.3  CL 97*  CO2 32  GLUCOSE 85  BUN 21  CREATININE 0.80  CALCIUM 9.3   GFR: Estimated Creatinine Clearance: 31.9 mL/min (by C-G formula based on SCr of 0.8 mg/dL). Liver Function Tests: Recent Labs  Lab 01/04/20 1321  AST 14*  ALT 13  ALKPHOS 85  BILITOT 0.7  PROT 5.7*  ALBUMIN 2.3*   No results for input(s): LIPASE, AMYLASE in the last 168 hours. No results for input(s): AMMONIA in the last 168 hours. Coagulation Profile: No results for input(s): INR, PROTIME in the last 168 hours. Cardiac Enzymes: No results for input(s): CKTOTAL, CKMB, CKMBINDEX, TROPONINI in the last 168 hours. BNP (last 3 results) No results for input(s): PROBNP in the last 8760 hours. HbA1C: No results for input(s): HGBA1C in the last 72 hours. CBG: No results for input(s): GLUCAP in the last 168 hours. Lipid Profile: No results for input(s): CHOL, HDL, LDLCALC, TRIG, CHOLHDL, LDLDIRECT in the last 72 hours. Thyroid Function Tests: No results for input(s): TSH, T4TOTAL, FREET4, T3FREE, THYROIDAB in the last 72 hours. Anemia Panel: Recent Labs    01/04/20 1847  FERRITIN 22  TIBC 323  IRON 23*   Urine analysis:    Component Value Date/Time   COLORURINE YELLOW 05/18/2019 1712   APPEARANCEUR CLEAR 05/18/2019 1712   LABSPEC 1.017 05/18/2019 1712   PHURINE 5.0 05/18/2019 1712   GLUCOSEU NEGATIVE 05/18/2019 1712   HGBUR NEGATIVE 05/18/2019 1712   BILIRUBINUR NEGATIVE 05/18/2019 1712   KETONESUR NEGATIVE 05/18/2019 1712   PROTEINUR  30 (A) 05/18/2019 1712   NITRITE NEGATIVE 05/18/2019 1712   LEUKOCYTESUR NEGATIVE 05/18/2019 1712   Sepsis Labs: @LABRCNTIP (procalcitonin:4,lacticidven:4)  ) Recent Results (from the past 240 hour(s))  Respiratory Panel by RT PCR (Flu A&B, Covid) - Nasopharyngeal Swab     Status: None   Collection Time: 01/04/20  1:46 PM   Specimen: Nasopharyngeal Swab  Result Value Ref Range Status   SARS Coronavirus 2 by RT PCR NEGATIVE NEGATIVE Final    Comment: (NOTE) SARS-CoV-2 target nucleic acids are NOT DETECTED.  The SARS-CoV-2 RNA is generally detectable in upper respiratoy specimens during the acute phase of infection. The lowest concentration of SARS-CoV-2 viral copies this assay can detect is 131 copies/mL. A negative result does not preclude SARS-Cov-2 infection and should not be used as the sole basis for treatment or other patient management decisions. A negative result may occur with  improper specimen collection/handling, submission of specimen other than nasopharyngeal swab, presence of viral mutation(s) within the areas targeted by this assay, and inadequate number of viral copies (<131 copies/mL). A negative result must be combined with clinical observations, patient  history, and epidemiological information. The expected result is Negative.  Fact Sheet for Patients:  PinkCheek.be  Fact Sheet for Healthcare Providers:  GravelBags.it  This test is no t yet approved or cleared by the [place] FDA and  has been authorized for detection and/or diagnosis of SARS-CoV-2 by FDA under an Emergency Use Authorization (EUA). This EUA will remain  in effect (meaning this test can be used) for the duration of the COVID-19 declaration under Section 564(b)(1) of the Act, 21 U.S.C. section 360bbb-3(b)(1), unless the authorization is terminated or revoked sooner.     Influenza A by PCR NEGATIVE NEGATIVE Final   Influenza B  by PCR NEGATIVE NEGATIVE Final    Comment: (NOTE) The Xpert Xpress SARS-CoV-2/FLU/RSV assay is intended as an aid in  the diagnosis of influenza from Nasopharyngeal swab specimens and  should not be used as a sole basis for treatment. Nasal washings and  aspirates are unacceptable for Xpert Xpress SARS-CoV-2/FLU/RSV  testing.  Fact Sheet for Patients: PinkCheek.be  Fact Sheet for Healthcare Providers: GravelBags.it  This test is not yet approved or cleared by the [place] FDA and  has been authorized for detection and/or diagnosis of SARS-CoV-2 by  FDA under an Emergency Use Authorization (EUA). This EUA will remain  in effect (meaning this test can be used) for the duration of the  Covid-19 declaration under Section 564(b)(1) of the Act, 21  U.S.C. section 360bbb-3(b)(1), unless the authorization is  terminated or revoked. Performed at [facility], [place] [address], [place], [place] [address]   Surgical PCR screen     Status: None   Collection Time: 01/04/20  8:49 PM   Specimen: Nasal Mucosa; Nasal Swab  Result Value Ref Range Status   MRSA, PCR NEGATIVE NEGATIVE Final   Staphylococcus aureus NEGATIVE NEGATIVE Final    Comment: (NOTE) The Xpert SA Assay (FDA approved for NASAL specimens in patients 77 years of age and older), is one component of a comprehensive surveillance program. It is not intended to diagnose infection nor to guide or monitor treatment. Performed at [facility], [place] [address], [place], [place] [address]       Studies: Pelvis Portable  Result Date: 01/05/2020 CLINICAL DATA:  ORIF left femur fracture EXAM: PORTABLE PELVIS 1-2 VIEWS COMPARISON:  01/04/2020 FINDINGS: Interval ORIF left subtrochanteric fracture. Hardware in satisfactory position. Compression screw and intramedullary rod have been placed. Left hip joint normal. No other pelvic fracture.  IMPRESSION: ORIF subtrochanteric fracture left femur Electronically Signed   By: Franchot Gallo M.D.   On: 01/05/2020 14:55   DG CHEST PORT 1 VIEW  Result Date: 01/05/2020 CLINICAL DATA:  Hypoxia. EXAM: PORTABLE CHEST 1 VIEW COMPARISON:  CT chest 09/14/2019.  Chest radiograph May 17, 2019. FINDINGS: Right Port-A-Cath with the tip projecting at the right atrium. Similar cardiomediastinal silhouette. Aortic atherosclerosis. Small left greater than right pleural effusions. No discernible pneumothorax. Overlying left basilar opacities. Cavitary right upper lobe mass, better characterized on prior CT chest. IMPRESSION: 1. Small left greater than right pleural effusions. Overlying left basilar opacities may represent atelectasis, aspiration, and/or pneumonia. 2. Cavitary right upper lobe mass, better characterized on prior CT chest. Electronically Signed   By: Margaretha Sheffield MD   On: 01/05/2020 15:52   DG C-Arm 1-60 Min-No Report  Result Date: 01/05/2020 Fluoroscopy was utilized by the requesting physician.  No radiographic interpretation.   DG HIP OPERATIVE UNILAT W OR W/O PELVIS LEFT  Result Date: 01/05/2020 CLINICAL DATA:  Open reduction internal fixation for fracture EXAM: OPERATIVE LEFT HIP  2 VIEWS TECHNIQUE: Fluoroscopic spot image(s) were submitted for interpretation post-operatively. COMPARISON:  January 04, 2020 FLUOROSCOPY TIME:  1 minutes 53 seconds; 4 acquired images FINDINGS: Frontal and lateral views obtained. There is screw and rod fixation through a comminuted fracture of intertrochanteric femur region. Alignment is near anatomic following screw and rod fixation. Tip of the screws in the proximal femoral head. No new fracture. No dislocation evident. No appreciable joint space narrowing. IMPRESSION: Screw and rod fixation through to comminuted fracture of intertrochanteric femur region with alignment near anatomic after surgical fixation. No new fracture. No dislocation. Electronically  Signed   By: Lowella Grip III M.D.   On: 01/05/2020 14:15   DG FEMUR PORT MIN 2 VIEWS LEFT  Result Date: 01/05/2020 CLINICAL DATA:  Left hip fracture EXAM: LEFT FEMUR PORTABLE 2 VIEWS COMPARISON:  01/04/2020 FINDINGS: Subtrochanteric fracture left femur has been fixed with a screw and locking intramedullary rod extending into the distal femur. Hardware in satisfactory position. Proximal fracture fragment projects anteriorly. Left hip joint normal. IMPRESSION: Satisfactory ORIF  subtrochanteric fracture left femur. Electronically Signed   By: Franchot Gallo M.D.   On: 01/05/2020 14:54    Scheduled Meds: . [MAR Hold] Chlorhexidine Gluconate Cloth  6 each Topical Daily  . [MAR Hold] enoxaparin (LOVENOX) injection  30 mg Subcutaneous Q24H  . feeding supplement  1 Container Oral Q24H  . feeding supplement  237 mL Oral BID BM  . [MAR Hold] ferrous sulfate  325 mg Oral BID WC  . ipratropium-albuterol  3 mL Nebulization BID  . labetalol      . multivitamin with minerals  1 tablet Oral Daily    Continuous Infusions: . acetaminophen    . ceFAZolin    . lactated ringers 0  (01/05/20 1141)  . methocarbamol (ROBAXIN) IV       LOS: 1 day     Kayleen Memos, MD [facility] Pager [phone]  If 7PM-7AM, please contact night-coverage [email or web address] Password [email or web address] 01/05/2020, 4:21 PM

## 2020-01-05 NOTE — Progress Notes (Signed)
PT Cancellation Note  Patient Details Name: Olivia Hooper MRN: [id number] DOB: 04-20-1940   Cancelled Treatment:    Reason Eval/Treat Not Completed: Patient at procedure or test/unavailable. Pt in surgery at this time. Continue to follow    [facility] 01/05/2020, 9:39 AM

## 2020-01-05 NOTE — Transfer of Care (Signed)
Immediate Anesthesia Transfer of Care Note  Patient: Olivia Hooper  Procedure(s) Performed: INTRAMEDULLARY (IM) NAIL LEFT  FEMORAL (Left )  Patient Location: PACU  Anesthesia Type:General  Level of Consciousness: drowsy  Airway & Oxygen Therapy: Patient Spontanous Breathing, Patient connected to face mask oxygen and nasal trumpet placed by anesthesiologist  Post-op Assessment: Report given to RN and Post -op Vital signs reviewed and unstable, Anesthesiologist notified  Post vital signs: Reviewed and unstable  Last Vitals:  Vitals Value Taken Time  BP 140/71 01/05/20 1415  Temp    Pulse 79 01/05/20 1419  Resp 20 01/05/20 1419  SpO2 91 % 01/05/20 1419  Vitals shown include unvalidated device data.  Last Pain:  Vitals:   01/05/20 0943  TempSrc: Oral  PainSc:       Patients Stated Pain Goal: 4 (37/02/30 1720)  Complications: No complications documented.

## 2020-01-05 NOTE — Progress Notes (Signed)
CRITICAL VALUE ALERT  Critical Value:  PCO2 73  Date & Time Notied:  01/05/20 1530  Provider Notified: Dr. Nevada Crane   Orders Received/Actions taken: Pt placed on Bipap

## 2020-01-06 LAB — TYPE AND SCREEN
ABO/RH(D): A POS
Antibody Screen: POSITIVE
DAT, IgG: NEGATIVE
Unit division: 0
Unit division: 0

## 2020-01-06 LAB — CBC WITH DIFFERENTIAL/PLATELET
Abs Immature Granulocytes: 0.06 10*3/uL (ref 0.00–0.07)
Basophils Absolute: 0 10*3/uL (ref 0.0–0.1)
Basophils Relative: 0 %
Eosinophils Absolute: 0 10*3/uL (ref 0.0–0.5)
Eosinophils Relative: 0 %
HCT: 34.4 % — ABNORMAL LOW (ref 36.0–46.0)
Hemoglobin: 11.4 g/dL — ABNORMAL LOW (ref 12.0–15.0)
Immature Granulocytes: 0 %
Lymphocytes Relative: 6 %
Lymphs Abs: 0.8 10*3/uL (ref 0.7–4.0)
MCH: 30.3 pg (ref 26.0–34.0)
MCHC: 33.1 g/dL (ref 30.0–36.0)
MCV: 91.5 fL (ref 80.0–100.0)
Monocytes Absolute: 1.5 10*3/uL — ABNORMAL HIGH (ref 0.1–1.0)
Monocytes Relative: 11 %
Neutro Abs: 11.3 10*3/uL — ABNORMAL HIGH (ref 1.7–7.7)
Neutrophils Relative %: 83 %
Platelets: 211 10*3/uL (ref 150–400)
RBC: 3.76 MIL/uL — ABNORMAL LOW (ref 3.87–5.11)
RDW: 15.1 % (ref 11.5–15.5)
WBC: 13.8 10*3/uL — ABNORMAL HIGH (ref 4.0–10.5)
nRBC: 0 % (ref 0.0–0.2)

## 2020-01-06 LAB — BPAM RBC
Blood Product Expiration Date: 202111202359
Blood Product Expiration Date: 202111212359
ISSUE DATE / TIME: 202110291025
ISSUE DATE / TIME: 202110291025
Unit Type and Rh: 6200
Unit Type and Rh: 6200

## 2020-01-06 LAB — BASIC METABOLIC PANEL
Anion gap: 8 (ref 5–15)
BUN: 19 mg/dL (ref 8–23)
CO2: 32 mmol/L (ref 22–32)
Calcium: 9.4 mg/dL (ref 8.9–10.3)
Chloride: 98 mmol/L (ref 98–111)
Creatinine, Ser: 0.76 mg/dL (ref 0.44–1.00)
GFR, Estimated: >60 mL/min (ref >60)
Glucose, Bld: 154 mg/dL — ABNORMAL HIGH (ref 70–99)
Potassium: 4.5 mmol/L (ref 3.5–5.1)
Sodium: 138 mmol/L (ref 135–145)

## 2020-01-06 LAB — CBC
HCT: 32.5 % — ABNORMAL LOW (ref 36.0–46.0)
Hemoglobin: 10.8 g/dL — ABNORMAL LOW (ref 12.0–15.0)
MCH: 30.6 pg (ref 26.0–34.0)
MCHC: 33.2 g/dL (ref 30.0–36.0)
MCV: 92.1 fL (ref 80.0–100.0)
Platelets: 206 10*3/uL (ref 150–400)
RBC: 3.53 MIL/uL — ABNORMAL LOW (ref 3.87–5.11)
RDW: 15.2 % (ref 11.5–15.5)
WBC: 12.7 10*3/uL — ABNORMAL HIGH (ref 4.0–10.5)
nRBC: 0 % (ref 0.0–0.2)

## 2020-01-06 MED ORDER — ENOXAPARIN SODIUM 30 MG/0.3ML ~~LOC~~ SOLN: 30.0000 mg | SUBCUTANEOUS | 0 refills | Status: DC

## 2020-01-06 MED ORDER — HYDROCODONE-ACETAMINOPHEN 5-325 MG PO TABS
1.0000 | ORAL_TABLET | ORAL | 0 refills | Status: DC | PRN
Start: 2020-01-06 — End: 2020-01-12

## 2020-01-06 NOTE — Evaluation (Signed)
Physical Therapy Evaluation Patient Details Name: Olivia Hooper MRN: [id number] DOB: [birth year] Today's Date: 01/06/2020   History of Present Illness  This is an 79 year old female with past medical history of chronic hypoxic respiratory failure on 2 L at baseline, COPD, rheumatoid arthritis, left breast cancer status post mastectomy, osteoporosis, hypertension, hyperlipidemia, Mycobacterium abscessus lung infection s/p treatment who presented to the ED with chief complaint of fall.Left hip x-ray positive for left femur subtrochanteric fracture with extension to greater and lesser trochanters. S/P Intramedullary fixation, Left femur.01/05/20: Some hypoxia and hypercarbia post surgery,requiring BiPAP  , likely secondary to atelectasis seen on chest x-ray.  Clinical Impression  The patient is alert, daughter at bedside. Patient independent PTA with no ambulatory device. Patient tolerated  Mobilizing to sitting on the bed edge with 2 max/total assist. Patient sat x 5 minutes. Patient on 4 L [place], SPO2 89% with activity, 93% at rest. BP supine 124/43, sitting 121/48 ,post sitting 126/42. HR 94-112.  Patient may benefit from ost acute rehab, daughter works , patient will be home alone. Patient and daughter agreeable to looking into SNF/rehab.  Pt admitted with above diagnosis.   Pt currently with functional limitations due to the deficits listed below (see PT Problem List). Pt will benefit from skilled PT to increase their independence and safety with mobility to allow discharge to the venue listed below.       Follow Up Recommendations SNF;Supervision/Assistance - 24 hour    Equipment Recommendations   (eventually will need platform RW)    Recommendations for Other Services       Precautions / Restrictions Restrictions LLE Weight Bearing: Weight bearing as tolerated Other Position/Activity Restrictions: severe hand RA      Mobility  Bed Mobility Overal bed mobility: Needs Assistance Bed  Mobility: Rolling;Supine to Sit;Sit to Supine Rolling: Total assist;+2 for physical assistance;+2 for safety/equipment   Supine to sit: Total assist;+2 for physical assistance;+2 for safety/equipment Sit to supine: Total assist;+2 for physical assistance;+2 for safety/equipment   General bed mobility comments: assist with legs and trunk to sit up on bed edge and to return to supine.    Transfers                 General transfer comment: NT  Ambulation/Gait                Stairs            Wheelchair Mobility    Modified Rankin (Stroke Patients Only)       Balance Overall balance assessment: History of Falls;Needs assistance Sitting-balance support: Feet supported;Bilateral upper extremity supported Sitting balance-Leahy Scale: Poor Sitting balance - Comments: holding onto therapist, sat on bed edge x 5 minutes                         Standardized Balance Assessment Standardized Balance Assessment : Berg Balance Test           Pertinent Vitals/Pain Pain Assessment: Faces Faces Pain Scale: Hurts little more Pain Location: left hip to knee Pain Descriptors / Indicators: Grimacing;Guarding;Moaning;Discomfort Pain Intervention(s): Premedicated before session;Repositioned;Monitored during session;Limited activity within patient's tolerance    Home Living Family/patient expects to be discharged to:: Private residence Living Arrangements: Children Available Help at Discharge: Family;Available PRN/intermittently Type of Home: House Home Access: Stairs to enter Entrance Stairs-Rails: Left Entrance Stairs-Number of Steps: 4 Home Layout: One level Home Equipment: Shower seat;Grab bars - tub/shower;Walker - 2 wheels Additional Comments: daughter has been  staying with patient but works so pt. home alone then    Prior Function Level of Independence: Independent         Comments: Pt reports independent with ADLs, grocery shopping, driving,  ambulating in community without AD; children do some grocery shopping . Pt denies any recent falls until now     Hand Dominance   Dominant Hand: Right    Extremity/Trunk Assessment   Upper Extremity Assessment Upper Extremity Assessment: Defer to OT evaluation    Lower Extremity Assessment Lower Extremity Assessment: LLE deficits/detail;RLE deficits/detail RLE Deficits / Details: RA deformities offet/, able to lift leg and flex hip LLE Deficits / Details: requires assistance to move leg on bed, foot/ankle RA deformities    Cervical / Trunk Assessment Cervical / Trunk Assessment: Kyphotic  Communication   Communication: No difficulties  Cognition Arousal/Alertness: Awake/alert Behavior During Therapy: WFL for tasks assessed/performed Overall Cognitive Status: Within Functional Limits for tasks assessed                                        General Comments      Exercises     Assessment/Plan    PT Assessment Patient needs continued PT services  PT Problem List Decreased strength;Decreased range of motion;Decreased activity tolerance;Decreased safety awareness;Decreased knowledge of precautions;Decreased balance;Decreased mobility;Cardiopulmonary status limiting activity       PT Treatment Interventions DME instruction;Gait training;Functional mobility training;Patient/family education;Therapeutic activities;Therapeutic exercise    PT Goals (Current goals can be found in the Care Plan section)  Acute Rehab PT Goals Patient Stated Goal: agreed to get up PT Goal Formulation: With patient/family Time For Goal Achievement: 01/20/20 Potential to Achieve Goals: Fair    Frequency Min 3X/week   Barriers to discharge Decreased caregiver support      Co-evaluation               AM-PAC PT "6 Clicks" Mobility  Outcome Measure Help needed turning from your back to your side while in a flat bed without using bedrails?: Total Help needed moving from  lying on your back to sitting on the side of a flat bed without using bedrails?: Total Help needed moving to and from a bed to a chair (including a wheelchair)?: Total Help needed standing up from a chair using your arms (e.g., wheelchair or bedside chair)?: Total Help needed to walk in hospital room?: Total Help needed climbing 3-5 steps with a railing? : Total 6 Click Score: 6    End of Session Equipment Utilized During Treatment: Oxygen Activity Tolerance: Patient tolerated treatment well Patient left: in bed;with call bell/phone within reach;with family/visitor present Nurse Communication: Mobility status PT Visit Diagnosis: Unsteadiness on feet (R26.81);Difficulty in walking, not elsewhere classified (R26.2);Pain Pain - Right/Left: Left Pain - part of body: Hip    Time: 1328-1400 PT Time Calculation (min) (ACUTE ONLY): 32 min   Charges:   PT Evaluation $PT Eval Moderate Complexity: 1 Mod PT Treatments $Therapeutic Activity: 8-22 mins        Tresa Endo PT Acute Rehabilitation Services Pager [phone] Office [phone]   Claretha Cooper 01/06/2020, 2:25 PM

## 2020-01-06 NOTE — Progress Notes (Signed)
    Subjective:  Patient reports pain as mild to moderate.  On BiPap in step down unit.  Objective:   VITALS:   Vitals:   01/06/20 0400 01/06/20 0414 01/06/20 0500 01/06/20 0600  BP: (!) 126/48  (!) 118/40 (!) 114/43  Pulse: 84 81 80 79  Resp: (!) 0 (!) 8 (!) 0 (!) 22  Temp: 99.1 F (37.3 C)     TempSrc: Axillary     SpO2: 100% 97% 97% 97%  Weight:      Height:        NAD ABD soft Sensation intact distally Intact pulses distally Dorsiflexion/Plantar flexion intact Incision: dressing C/D/I Compartment soft   Lab Results  Component Value Date   WBC 12.7 (H) 01/06/2020   HGB 10.8 (L) 01/06/2020   HCT 32.5 (L) 01/06/2020   MCV 92.1 01/06/2020   PLT 206 01/06/2020   BMET    Component Value Date/Time   NA 138 01/06/2020 0251   K 4.5 01/06/2020 0251   CL 98 01/06/2020 0251   CO2 32 01/06/2020 0251   GLUCOSE 154 (H) 01/06/2020 0251   BUN 19 01/06/2020 0251   CREATININE 0.76 01/06/2020 0251   CREATININE 0.72 01/11/2019 1314   CALCIUM 9.4 01/06/2020 0251   GFRNONAA >60 01/06/2020 0251   GFRNONAA >60 01/11/2019 1314   GFRAA >60 05/22/2019 1016   GFRAA >60 01/11/2019 1314     Assessment/Plan: 1 Day Post-Op   Principal Problem:   Closed left hip fracture, initial encounter (HCC) Active Problems:   COPD GOLD II / still smoking    Rheumatoid arthritis (HCC)   Pulmonary mycobacterial infection (HCC)   Anemia   WBAT with walker for transfers DVT ppx: Lovenox, SCDs, TEDS PO pain control PT/OT Dispo: D/C planning    Hilton Cork Tadao Emig 01/06/2020, 8:10 AM   Rod Can, MD [phone] [facility] now [facility]  [place] [address], [address], [place], [place] [address] Phone: [phone] [email or web address] Facebook  [employer]

## 2020-01-06 NOTE — Progress Notes (Signed)
PT Cancellation Note  Patient Details Name: Bernice Mcauliffe MRN: [id number] DOB: [birth year]   Cancelled Treatment:    Reason Eval/Treat Not Completed: Medical issues which prohibited therapy , requiring BiPAP. Will check back later.   Claretha Cooper 01/06/2020, 7:33 AM [place] Pager [phone] Office [phone]

## 2020-01-06 NOTE — Progress Notes (Signed)
PROGRESS NOTE  Olivia Hooper BHA:[id number] DOB: [birth year] DOA: 01/04/2020 PCP: [name], MD  HPI/Recap of past 24 hours: This is an 79 year old female with past medical history of chronic hypoxic respiratory failure on 2 L at baseline, COPD, rheumatoid arthritis, left breast cancer status post mastectomy, osteoporosis, hypertension, hyperlipidemia, Mycobacterium abscessus lung infection s/p treatment who presented to the ED with chief complaint of fall.  She went to walk outside and fell down approximately 4 steps onto her left hip onto the concrete.  Believes that she tripped over some carpeting which made her fall.  No loss of consciousness, did not hit her head, not on blood thinners.  No prodromal symptoms.  No other complaints at this time.  Currently has some left hip discomfort but otherwise no issues  ED Course: Afebrile and hemodynamically stable on baseline 2 LPM.  Labs notable for leukocytosis Hb 8.3 (previously 9.7 in April).  Left hip x-ray positive for left femur subtrochanteric fracture with extension to greater and lesser trochanters.  Orthopedic surgery was consulted by the ED.  Post Left femur intramedullary fixation on 01/05/2020.  Postsurgical hypoxia and hypercarbia with pH of 7.2 and PCO2 of 73.  Placed on BiPAP with improvement of her acidosis and hypercarbia.  01/06/20: Seen and examined at bedside.  She is alert and following commands.  She has moderate pain in her left hip prior to taking her pain medication.  Assessment/Plan: Principal Problem:   Closed left hip fracture, initial encounter [facility]) Active Problems:   COPD GOLD II / still smoking    Rheumatoid arthritis ([place])   Pulmonary mycobacterial infection ([place])   Anemia  Left hip fracture status post intramedullary fixation of left femur on 01/05/2020 by Dr. Lyla Glassing Pain control DVT prophylaxis Monitor H&H PT OT assessment with orthopedic surgeries guidance Fall precautions TOC consult for possible  SNF placement  Acute on chronic hypoxic hypercarbic respiratory failure initially requiring BIPAP, improving, likely 2/2 to atelectasis with underlying COPD On 2 L oxygen by nasal cannula at baseline Became acutely hypoxic after surgery ABG revealing pH 7.2 and PCO2 of 73 Was on BIPAP overnight Improved acidosis  Continue to closely monitor Maintain oxygen saturation greater than 90%  Iron deficiency anemia Iron studies consistent with severe iron deficiency Continue ferrous sulfate 325 mg twice daily Transfused 2 unit PRBC perioperatively Continue to monitor H&H  Acute blood loss anemia post orthopedic surgery Management as stated above  Leukocytosis likely reactive perioperatively Monitor and trend Non toxic appearing Afebrile  History of mycoplasma/right upper lobe lung abscesses Follows with ID Procalcitonin elevated 1.42 Repeat procalcitonin to see if it trends down without abx Monitor  COPD C/w home bronchodilators Continue continuous oxygen supplementation  Severe protein calorie malnutrition BMI 15 Albumin 2.3 Oral supplement Dietitian consult    Code Status: Full code  Family Communication: None at bedside  Disposition Plan: Likely to SNF, pending PT OT assessment   Consultants:  Orthopedics  Procedures:  Left hip repair on 01/05/2020 by Dr. Lyla Glassing.  Antimicrobials:  None  DVT prophylaxis: Subcu Lovenox daily  Status is: Inpatient   Dispo:  Patient From: Home  Planned Disposition: [place]  Expected discharge date: 01/08/20  Medically stable for discharge: No, ongoing management of medical condition perioperatively.         Objective: Vitals:   01/06/20 1130 01/06/20 1200 01/06/20 1238 01/06/20 1239  BP:  (!) 125/39    Pulse: 89 94    Resp: 19 (!) 27    Temp:  97.8 F (36.6 C) 98.1 F (36.7 C)  TempSrc:   Oral Oral  SpO2: 95% 91%    Weight:      Height:        Intake/Output Summary (Last 24  hours) at 01/06/2020 1346 Last data filed at 01/05/2020 1700 Gross per 24 hour  Intake 1100 ml  Output 350 ml  Net 750 ml   Filed Weights   01/04/20 1227  Weight: 36 kg    Exam:  . General: 79 y.o. year-old female frail in no acute distress.  Alert and interactive.   . Cardiovascular: Regular rate and rhythm no rubs or gallops.  Marland Kitchen Respiratory: Mild rales at bases no wheezing noted.  Poor inspiratory effort.  On BiPAP.   Marland Kitchen Abdomen: Soft nontender normal bowel sounds present.  . Musculoskeletal: No lower extremity edema bilaterally. Marland Kitchen Psychiatry: Mood is appropriate for condition and setting.  Data Reviewed: CBC: Recent Labs  Lab 01/04/20 1321 01/05/20 2128 01/06/20 0251  WBC 12.4* 13.8* 12.7*  NEUTROABS  --  11.3*  --   HGB 8.3* 11.4* 10.8*  HCT 26.4* 34.4* 32.5*  MCV 95.0 91.5 92.1  PLT 299 211 341   Basic Metabolic Panel: Recent Labs  Lab 01/04/20 1321 01/05/20 2128 01/06/20 0251  NA 137 136 138  K 4.3 4.7 4.5  CL 97* 97* 98  CO2 32 29 32  GLUCOSE 85 339* 154*  BUN 21 18 19   CREATININE 0.80 0.75 0.76  CALCIUM 9.3 9.0 9.4  MG  --  1.7  --   PHOS  --  3.5  --    GFR: Estimated Creatinine Clearance: 31.9 mL/min (by C-G formula based on SCr of 0.76 mg/dL). Liver Function Tests: Recent Labs  Lab 01/04/20 1321 01/05/20 2128  AST 14* 13*  ALT 13 13  ALKPHOS 85 66  BILITOT 0.7 0.6  PROT 5.7* 5.2*  ALBUMIN 2.3* 2.0*   No results for input(s): LIPASE, AMYLASE in the last 168 hours. No results for input(s): AMMONIA in the last 168 hours. Coagulation Profile: No results for input(s): INR, PROTIME in the last 168 hours. Cardiac Enzymes: No results for input(s): CKTOTAL, CKMB, CKMBINDEX, TROPONINI in the last 168 hours. BNP (last 3 results) No results for input(s): PROBNP in the last 8760 hours. HbA1C: No results for input(s): HGBA1C in the last 72 hours. CBG: No results for input(s): GLUCAP in the last 168 hours. Lipid Profile: No results for  input(s): CHOL, HDL, LDLCALC, TRIG, CHOLHDL, LDLDIRECT in the last 72 hours. Thyroid Function Tests: No results for input(s): TSH, T4TOTAL, FREET4, T3FREE, THYROIDAB in the last 72 hours. Anemia Panel: Recent Labs    01/04/20 1847  FERRITIN 22  TIBC 323  IRON 23*   Urine analysis:    Component Value Date/Time   COLORURINE YELLOW 05/18/2019 1712   APPEARANCEUR CLEAR 05/18/2019 1712   LABSPEC 1.017 05/18/2019 1712   PHURINE 5.0 05/18/2019 1712   GLUCOSEU NEGATIVE 05/18/2019 1712   HGBUR NEGATIVE 05/18/2019 1712   BILIRUBINUR NEGATIVE 05/18/2019 1712   KETONESUR NEGATIVE 05/18/2019 1712   PROTEINUR 30 (A) 05/18/2019 1712   NITRITE NEGATIVE 05/18/2019 1712   LEUKOCYTESUR NEGATIVE 05/18/2019 1712   Sepsis Labs: @LABRCNTIP (procalcitonin:4,lacticidven:4)  ) Recent Results (from the past 240 hour(s))  Respiratory Panel by RT PCR (Flu A&B, Covid) - Nasopharyngeal Swab     Status: None   Collection Time: 01/04/20  1:46 PM   Specimen: Nasopharyngeal Swab  Result Value Ref Range Status   SARS Coronavirus 2  by RT PCR NEGATIVE NEGATIVE Final    Comment: (NOTE) SARS-CoV-2 target nucleic acids are NOT DETECTED.  The SARS-CoV-2 RNA is generally detectable in upper respiratoy specimens during the acute phase of infection. The lowest concentration of SARS-CoV-2 viral copies this assay can detect is 131 copies/mL. A negative result does not preclude SARS-Cov-2 infection and should not be used as the sole basis for treatment or other patient management decisions. A negative result may occur with  improper specimen collection/handling, submission of specimen other than nasopharyngeal swab, presence of viral mutation(s) within the areas targeted by this assay, and inadequate number of viral copies (<131 copies/mL). A negative result must be combined with clinical observations, patient history, and epidemiological information. The expected result is Negative.  Fact Sheet for Patients:    PinkCheek.be  Fact Sheet for Healthcare Providers:  GravelBags.it  This test is no t yet approved or cleared by the [place] FDA and  has been authorized for detection and/or diagnosis of SARS-CoV-2 by FDA under an Emergency Use Authorization (EUA). This EUA will remain  in effect (meaning this test can be used) for the duration of the COVID-19 declaration under Section 564(b)(1) of the Act, 21 U.S.C. section 360bbb-3(b)(1), unless the authorization is terminated or revoked sooner.     Influenza A by PCR NEGATIVE NEGATIVE Final   Influenza B by PCR NEGATIVE NEGATIVE Final    Comment: (NOTE) The Xpert Xpress SARS-CoV-2/FLU/RSV assay is intended as an aid in  the diagnosis of influenza from Nasopharyngeal swab specimens and  should not be used as a sole basis for treatment. Nasal washings and  aspirates are unacceptable for Xpert Xpress SARS-CoV-2/FLU/RSV  testing.  Fact Sheet for Patients: PinkCheek.be  Fact Sheet for Healthcare Providers: GravelBags.it  This test is not yet approved or cleared by the [place] FDA and  has been authorized for detection and/or diagnosis of SARS-CoV-2 by  FDA under an Emergency Use Authorization (EUA). This EUA will remain  in effect (meaning this test can be used) for the duration of the  Covid-19 declaration under Section 564(b)(1) of the Act, 21  U.S.C. section 360bbb-3(b)(1), unless the authorization is  terminated or revoked. Performed at [facility], [place] [address] [place], [place] [address]   Surgical PCR screen     Status: None   Collection Time: 01/04/20  8:49 PM   Specimen: Nasal Mucosa; Nasal Swab  Result Value Ref Range Status   MRSA, PCR NEGATIVE NEGATIVE Final   Staphylococcus aureus NEGATIVE NEGATIVE Final    Comment: (NOTE) The Xpert SA Assay (FDA approved for NASAL specimens in  patients 65 years of age and older), is one component of a comprehensive surveillance program. It is not intended to diagnose infection nor to guide or monitor treatment. Performed at [facility], [place] [address] [place], [place] [address]       Studies: Pelvis Portable  Result Date: 01/05/2020 CLINICAL DATA:  ORIF left femur fracture EXAM: PORTABLE PELVIS 1-2 VIEWS COMPARISON:  01/04/2020 FINDINGS: Interval ORIF left subtrochanteric fracture. Hardware in satisfactory position. Compression screw and intramedullary rod have been placed. Left hip joint normal. No other pelvic fracture. IMPRESSION: ORIF subtrochanteric fracture left femur Electronically Signed   By: Franchot Gallo M.D.   On: 01/05/2020 14:55   DG CHEST PORT 1 VIEW  Result Date: 01/05/2020 CLINICAL DATA:  Hypoxia. EXAM: PORTABLE CHEST 1 VIEW COMPARISON:  CT chest 09/14/2019.  Chest radiograph May 17, 2019. FINDINGS: Right Port-A-Cath with the  tip projecting at the right atrium. Similar cardiomediastinal silhouette. Aortic atherosclerosis. Small left greater than right pleural effusions. No discernible pneumothorax. Overlying left basilar opacities. Cavitary right upper lobe mass, better characterized on prior CT chest. IMPRESSION: 1. Small left greater than right pleural effusions. Overlying left basilar opacities may represent atelectasis, aspiration, and/or pneumonia. 2. Cavitary right upper lobe mass, better characterized on prior CT chest. Electronically Signed   By: Margaretha Sheffield MD   On: 01/05/2020 15:52   DG FEMUR PORT MIN 2 VIEWS LEFT  Result Date: 01/05/2020 CLINICAL DATA:  Left hip fracture EXAM: LEFT FEMUR PORTABLE 2 VIEWS COMPARISON:  01/04/2020 FINDINGS: Subtrochanteric fracture left femur has been fixed with a screw and locking intramedullary rod extending into the distal femur. Hardware in satisfactory position. Proximal fracture fragment projects anteriorly. Left hip joint normal.  IMPRESSION: Satisfactory ORIF  subtrochanteric fracture left femur. Electronically Signed   By: Franchot Gallo M.D.   On: 01/05/2020 14:54    Scheduled Meds: . ascorbic acid  500 mg Oral BID  . Chlorhexidine Gluconate Cloth  6 each Topical Daily  . docusate sodium  100 mg Oral BID  . enoxaparin (LOVENOX) injection  30 mg Subcutaneous Q24H  . feeding supplement  1 Container Oral Q24H  . feeding supplement  237 mL Oral BID BM  . ferrous sulfate  325 mg Oral BID WC  . ipratropium-albuterol  3 mL Nebulization BID  . mouth rinse  15 mL Mouth Rinse BID  . mometasone-formoterol  2 puff Inhalation BID  . multivitamin with minerals  1 tablet Oral Daily  . senna  1 tablet Oral BID  . sodium chloride flush  10-40 mL Intracatheter Q12H    Continuous Infusions: . dextrose 5% lactated ringers 75 mL/hr at 01/06/20 0531  . methocarbamol (ROBAXIN) IV       LOS: 2 days     Kayleen Memos, MD [facility] Pager [phone]  If 7PM-7AM, please contact night-coverage [email or web address] Password [email or web address] 01/06/2020, 1:46 PM

## 2020-01-07 LAB — CBC
HCT: 30 % — ABNORMAL LOW (ref 36.0–46.0)
Hemoglobin: 9.4 g/dL — ABNORMAL LOW (ref 12.0–15.0)
MCH: 29.7 pg (ref 26.0–34.0)
MCHC: 31.3 g/dL (ref 30.0–36.0)
MCV: 94.9 fL (ref 80.0–100.0)
Platelets: 167 10*3/uL (ref 150–400)
RBC: 3.16 MIL/uL — ABNORMAL LOW (ref 3.87–5.11)
RDW: 15.1 % (ref 11.5–15.5)
WBC: 11.1 10*3/uL — ABNORMAL HIGH (ref 4.0–10.5)
nRBC: 0 % (ref 0.0–0.2)

## 2020-01-07 LAB — BASIC METABOLIC PANEL
Anion gap: 7 (ref 5–15)
BUN: 20 mg/dL (ref 8–23)
CO2: 33 mmol/L — ABNORMAL HIGH (ref 22–32)
Calcium: 9.3 mg/dL (ref 8.9–10.3)
Chloride: 97 mmol/L — ABNORMAL LOW (ref 98–111)
Creatinine, Ser: 0.62 mg/dL (ref 0.44–1.00)
GFR, Estimated: >60 mL/min (ref >60)
Glucose, Bld: 148 mg/dL — ABNORMAL HIGH (ref 70–99)
Potassium: 4 mmol/L (ref 3.5–5.1)
Sodium: 137 mmol/L (ref 135–145)

## 2020-01-07 MED ORDER — LACTATED RINGERS IV SOLN: INTRAVENOUS | Status: AC

## 2020-01-07 NOTE — Progress Notes (Signed)
PROGRESS NOTE  Olivia Hooper VEL:[id number] DOB: 1941/02/12 DOA: 01/04/2020 PCP: [name], MD  HPI/Recap of past 24 hours: This is an 79 year old female with past medical history of chronic hypoxic respiratory failure on 2 L at baseline, COPD, rheumatoid arthritis, left breast cancer status post mastectomy, osteoporosis, hypertension, hyperlipidemia, Mycobacterium abscessus lung infection s/p treatment who presented to the ED with chief complaint of fall.  She went to walk outside and fell down approximately 4 steps onto her left hip onto the concrete.  Believes that she tripped over some carpeting which made her fall.  No loss of consciousness, did not hit her head, not on blood thinners.  No prodromal symptoms.  No other complaints at this time.  Currently has some left hip discomfort but otherwise no issues  ED Course: Afebrile and hemodynamically stable on baseline 2 LPM.  Labs notable for leukocytosis Hb 8.3 (previously 9.7 in April).  Left hip x-ray positive for left femur subtrochanteric fracture with extension to greater and lesser trochanters.  Orthopedic surgery was consulted by the ED.  Post Left femur intramedullary fixation on 01/05/2020.  Postsurgical hypoxia and hypercarbia with pH of 7.2 and PCO2 of 73.  Placed on BiPAP with improvement of her acidosis and hypercarbia.  01/07/20: Seen and examined at bedside.  She is alert and interactive.  Denies any left hip pain at the time of this visit.  Denies dyspnea at rest.  She is on 2 L nasal cannula at baseline.  Assessment/Plan: Principal Problem:   Closed left hip fracture, initial encounter [facility]) Active Problems:   COPD GOLD II / still smoking    Rheumatoid arthritis ([place])   Pulmonary mycobacterial infection ([place])   Anemia  Left hip fracture status post intramedullary fixation of left femur on 01/05/2020 by Dr. Lyla Glassing Pain control DVT prophylaxis Monitor H&H PT OT assessment with orthopedic surgeries guidance Fall  precautions TOC consult for possible SNF placement  Resolving acute on chronic hypoxic hypercarbic respiratory failure initially requiring BIPAP, improving, likely 2/2 to atelectasis with underlying COPD On 2 L oxygen by nasal cannula at baseline Became acutely hypoxic after surgery-ABG revealed pH 7.2 and PCO2 of 73 Continue BiPAP at night Improved acidosis  Continue to closely monitor Maintain oxygen saturation greater than 90%  Iron deficiency anemia Iron studies consistent with severe iron deficiency Continue ferrous sulfate 325 mg twice daily Transfused 2 unit PRBC perioperatively Continue to monitor H&H  Acute blood loss anemia post orthopedic surgery Management as stated above Mild drop in hemoglobin Continue to monitor  Leukocytosis likely reactive perioperatively Monitor and trend Non toxic appearing Afebrile  History of mycoplasma/right upper lobe lung abscesses Follows with ID Procalcitonin elevated 1.42 on 01/05/2020 Repeat procalcitonin level in the morning  COPD C/w home bronchodilators Continue continuous oxygen supplementation  Severe protein calorie malnutrition BMI 15 Albumin 2.3 Oral supplement Dietitian consult    Code Status: Full code  Family Communication: None at bedside  Disposition Plan: Likely to SNF, pending PT OT assessment   Consultants:  Orthopedics  Procedures:  Left hip repair on 01/05/2020 by Dr. Lyla Glassing.  Antimicrobials:  None  DVT prophylaxis: Subcu Lovenox daily  Status is: Inpatient   Dispo:  Patient From: Home  Planned Disposition: [place]  Expected discharge date: 01/08/20  Medically stable for discharge: No, ongoing management of medical condition perioperatively.         Objective: Vitals:   01/07/20 0500 01/07/20 0600 01/07/20 0700 01/07/20 0924  BP: (!) 107/32 (!) 112/38 Marland Kitchen)  114/35   Pulse: 73 72 74   Resp: 16 (!) 0 (!) 8   Temp:    (!) 97.5 F (36.4 C)  TempSrc:     Oral  SpO2: 98% 99% 99%   Weight:      Height:        Intake/Output Summary (Last 24 hours) at 01/07/2020 1719 Last data filed at 01/07/2020 1646 Gross per 24 hour  Intake 3284.25 ml  Output 575 ml  Net 2709.25 ml   Filed Weights   01/04/20 1227  Weight: 36 kg    Exam:  . General: 79 y.o. year-old female frail in no acute distress.  Alert and interactive.   . Cardiovascular: Regular rate and rhythm no rubs or gallops. Marland Kitchen Respiratory: Mild rales at bases.  No wheezing noted.   .  Abdomen: Soft nontender normal bowel sounds present. . Musculoskeletal: No lower extremity edema bilaterally.  Marland Kitchen Psychiatry: Mood is appropriate for condition and setting.  Data Reviewed: CBC: Recent Labs  Lab 01/04/20 1321 01/05/20 2128 01/06/20 0251 01/07/20 0333  WBC 12.4* 13.8* 12.7* 11.1*  NEUTROABS  --  11.3*  --   --   HGB 8.3* 11.4* 10.8* 9.4*  HCT 26.4* 34.4* 32.5* 30.0*  MCV 95.0 91.5 92.1 94.9  PLT 299 211 206 315   Basic Metabolic Panel: Recent Labs  Lab 01/04/20 1321 01/05/20 2128 01/06/20 0251 01/07/20 0333  NA 137 136 138 137  K 4.3 4.7 4.5 4.0  CL 97* 97* 98 97*  CO2 32 29 32 33*  GLUCOSE 85 339* 154* 148*  BUN 21 18 19 20   CREATININE 0.80 0.75 0.76 0.62  CALCIUM 9.3 9.0 9.4 9.3  MG  --  1.7  --   --   PHOS  --  3.5  --   --    GFR: Estimated Creatinine Clearance: 31.9 mL/min (by C-G formula based on SCr of 0.62 mg/dL). Liver Function Tests: Recent Labs  Lab 01/04/20 1321 01/05/20 2128  AST 14* 13*  ALT 13 13  ALKPHOS 85 66  BILITOT 0.7 0.6  PROT 5.7* 5.2*  ALBUMIN 2.3* 2.0*   No results for input(s): LIPASE, AMYLASE in the last 168 hours. No results for input(s): AMMONIA in the last 168 hours. Coagulation Profile: No results for input(s): INR, PROTIME in the last 168 hours. Cardiac Enzymes: No results for input(s): CKTOTAL, CKMB, CKMBINDEX, TROPONINI in the last 168 hours. BNP (last 3 results) No results for input(s): PROBNP in the last 8760  hours. HbA1C: No results for input(s): HGBA1C in the last 72 hours. CBG: No results for input(s): GLUCAP in the last 168 hours. Lipid Profile: No results for input(s): CHOL, HDL, LDLCALC, TRIG, CHOLHDL, LDLDIRECT in the last 72 hours. Thyroid Function Tests: No results for input(s): TSH, T4TOTAL, FREET4, T3FREE, THYROIDAB in the last 72 hours. Anemia Panel: Recent Labs    01/04/20 1847  FERRITIN 22  TIBC 323  IRON 23*   Urine analysis:    Component Value Date/Time   COLORURINE YELLOW 05/18/2019 1712   APPEARANCEUR CLEAR 05/18/2019 1712   LABSPEC 1.017 05/18/2019 1712   PHURINE 5.0 05/18/2019 1712   GLUCOSEU NEGATIVE 05/18/2019 1712   HGBUR NEGATIVE 05/18/2019 1712   BILIRUBINUR NEGATIVE 05/18/2019 1712   KETONESUR NEGATIVE 05/18/2019 1712   PROTEINUR 30 (A) 05/18/2019 1712   NITRITE NEGATIVE 05/18/2019 1712   LEUKOCYTESUR NEGATIVE 05/18/2019 1712   Sepsis Labs: @LABRCNTIP (procalcitonin:4,lacticidven:4)  ) Recent Results (from the past 240 hour(s))  Respiratory Panel by  RT PCR (Flu A&B, Covid) - Nasopharyngeal Swab     Status: None   Collection Time: 01/04/20  1:46 PM   Specimen: Nasopharyngeal Swab  Result Value Ref Range Status   SARS Coronavirus 2 by RT PCR NEGATIVE NEGATIVE Final    Comment: (NOTE) SARS-CoV-2 target nucleic acids are NOT DETECTED.  The SARS-CoV-2 RNA is generally detectable in upper respiratoy specimens during the acute phase of infection. The lowest concentration of SARS-CoV-2 viral copies this assay can detect is 131 copies/mL. A negative result does not preclude SARS-Cov-2 infection and should not be used as the sole basis for treatment or other patient management decisions. A negative result may occur with  improper specimen collection/handling, submission of specimen other than nasopharyngeal swab, presence of viral mutation(s) within the areas targeted by this assay, and inadequate number of viral copies (<131 copies/mL). A negative  result must be combined with clinical observations, patient history, and epidemiological information. The expected result is Negative.  Fact Sheet for Patients:  PinkCheek.be  Fact Sheet for Healthcare Providers:  GravelBags.it  This test is no t yet approved or cleared by the [place] FDA and  has been authorized for detection and/or diagnosis of SARS-CoV-2 by FDA under an Emergency Use Authorization (EUA). This EUA will remain  in effect (meaning this test can be used) for the duration of the COVID-19 declaration under Section 564(b)(1) of the Act, 21 U.S.C. section 360bbb-3(b)(1), unless the authorization is terminated or revoked sooner.     Influenza A by PCR NEGATIVE NEGATIVE Final   Influenza B by PCR NEGATIVE NEGATIVE Final    Comment: (NOTE) The Xpert Xpress SARS-CoV-2/FLU/RSV assay is intended as an aid in  the diagnosis of influenza from Nasopharyngeal swab specimens and  should not be used as a sole basis for treatment. Nasal washings and  aspirates are unacceptable for Xpert Xpress SARS-CoV-2/FLU/RSV  testing.  Fact Sheet for Patients: PinkCheek.be  Fact Sheet for Healthcare Providers: GravelBags.it  This test is not yet approved or cleared by the [place] FDA and  has been authorized for detection and/or diagnosis of SARS-CoV-2 by  FDA under an Emergency Use Authorization (EUA). This EUA will remain  in effect (meaning this test can be used) for the duration of the  Covid-19 declaration under Section 564(b)(1) of the Act, 21  U.S.C. section 360bbb-3(b)(1), unless the authorization is  terminated or revoked. Performed at [facility], [place] [address], [place], [place] [address]   Surgical PCR screen     Status: None   Collection Time: 01/04/20  8:49 PM   Specimen: Nasal Mucosa; Nasal Swab  Result Value Ref Range Status    MRSA, PCR NEGATIVE NEGATIVE Final   Staphylococcus aureus NEGATIVE NEGATIVE Final    Comment: (NOTE) The Xpert SA Assay (FDA approved for NASAL specimens in patients 51 years of age and older), is one component of a comprehensive surveillance program. It is not intended to diagnose infection nor to guide or monitor treatment. Performed at [facility], [place] [address], [place], [place] [address]       Studies: No results found.  Scheduled Meds: . ascorbic acid  500 mg Oral BID  . Chlorhexidine Gluconate Cloth  6 each Topical Daily  . docusate sodium  100 mg Oral BID  . enoxaparin (LOVENOX) injection  30 mg Subcutaneous Q24H  . feeding supplement  1 Container Oral Q24H  . feeding supplement  237 mL Oral BID BM  . ferrous sulfate  325 mg Oral BID WC  . ipratropium-albuterol  3 mL Nebulization BID  . mouth rinse  15 mL Mouth Rinse BID  . mometasone-formoterol  2 puff Inhalation BID  . multivitamin with minerals  1 tablet Oral Daily  . senna  1 tablet Oral BID  . sodium chloride flush  10-40 mL Intracatheter Q12H    Continuous Infusions: . lactated ringers 75 mL/hr at 01/07/20 0537  . methocarbamol (ROBAXIN) IV       LOS: 3 days     Kayleen Memos, MD [facility] Pager [phone]  If 7PM-7AM, please contact night-coverage [email or web address] Password [email or web address] 01/07/2020, 5:19 PM

## 2020-01-07 NOTE — TOC Initial Note (Signed)
Transition of Care [facility]) - Progression Note    Patient Details  Name: Olivia Hooper MRN: [id number] Date of Birth: 1940/07/09  Transition of Care [facility]) CM/SW [place], [place] Phone Number: [phone] 01/07/2020, 11:13 AM  Clinical Narrative:     CSW contacted and left voicemail for Rodell,Darla (Daughter) [phone] for collateral information and to discuss SNF placement recommendation from Pt.       Expected Discharge Plan and Services                                                 Social Determinants of Health (SDOH) Interventions    Readmission Risk Interventions No flowsheet data found.

## 2020-01-07 NOTE — Progress Notes (Signed)
OT Cancellation Note  Patient Details Name: Sophi Calligan MRN: [id number] DOB: 08-22-1940   Cancelled Treatment:    Reason Eval/Treat Not Completed: Other (comment)  Noted pt is total A with plan of SNF- will defer OT eval to SNF at this time Kari Baars, [place] [phone] Office- [phone], Edwena Felty D 01/07/2020, 8:06 AM

## 2020-01-07 NOTE — Progress Notes (Signed)
Subjective: 2 Days Post-Op Procedure(s) (LRB): INTRAMEDULLARY (IM) NAIL LEFT  FEMORAL (Left)  Patient reports pain as mild to moderate.  Tolerating POs well.  Admits to flatus.  Resting comfortably in bed.  Objective:   VITALS:  Temp:  [97.5 F (36.4 C)-99.1 F (37.3 C)] 97.5 F (36.4 C) (10/31 0924) Pulse Rate:  [72-102] 74 (10/31 0700) Resp:  [0-27] 8 (10/31 0700) BP: (88-137)/(32-66) 114/35 (10/31 0700) SpO2:  [78 %-100 %] 99 % (10/31 0700)  General: WDWN patient in NAD. Psych:  Appropriate mood and affect. Neuro:  A&O x 3, Moving all extremities, sensation intact to light touch HEENT:  EOMs intact Chest:  Even non-labored respirations Skin: Dressing C/D/I, no rashes or lesions Extremities: warm/dry, mild edema to L hip, no erythema or echymosis.  No lymphadenopathy. Pulses: Popliteus 2+ MSK:  ROM: TKE, MMT: able to perform quad set    [place]    01/04/20 1321 01/04/20 1321 01/05/20 2128 01/05/20 2128 01/06/20 0251 01/07/20 0333  HGB 8.3*  --  11.4*  --  10.8* 9.4*  WBC 12.4*   < > 13.8*   < > 12.7* 11.1*  PLT 299   < > 211   < > 206 167   < > = values in this interval not displayed.   Recent Labs    01/06/20 0251 01/07/20 0333  NA 138 137  K 4.5 4.0  CL 98 97*  CO2 32 33*  BUN 19 20  CREATININE 0.76 0.62  GLUCOSE 154* 148*   No results for input(s): LABPT, INR in the last 72 hours.   Assessment/Plan: 2 Days Post-Op Procedure(s) (LRB): INTRAMEDULLARY (IM) NAIL LEFT  FEMORAL (Left)  Patient seen in rounds for Dr. Lyla Glassing. WBAT L LE with walker Up with therapy DVT ppx:  Lovenox, SCDs, TEDS Disp:  PT/OT recommend SNF Plan for outpatient po visit with Dr. Riley Lam PA-C [facility] Office:  [phone]

## 2020-01-07 NOTE — Progress Notes (Signed)
Patients heart rate in the 118-127 bpm. Oxygen 90% on 5L [place] Patient denies any pain. Patient asymptomatic at this time. No complaints of distress.   Will continue to monitor.

## 2020-01-07 NOTE — Progress Notes (Signed)
New swelling noted on LUE. Patient's daughter informed this RN an IV was previously put in that arm (note Left extremity is restricted due to hx of left mastectomy) by another nurse.  Warm compress applied to arm to help with swelling. Patient does not complain of pain at site. Will continue to monitor.

## 2020-01-07 NOTE — Progress Notes (Signed)
Bladder scan with 351cc uriine retention. Will notify MD for in and out cath order.

## 2020-01-08 ENCOUNTER — Encounter (HOSPITAL_COMMUNITY): Payer: Self-pay | Admitting: Internal Medicine

## 2020-01-08 ENCOUNTER — Inpatient Hospital Stay (HOSPITAL_COMMUNITY): Payer: Medicare Other

## 2020-01-08 DIAGNOSIS — J449 Chronic obstructive pulmonary disease, unspecified: Secondary | ICD-10-CM | POA: Diagnosis not present

## 2020-01-08 DIAGNOSIS — S72002A Fracture of unspecified part of neck of left femur, initial encounter for closed fracture: Secondary | ICD-10-CM | POA: Diagnosis not present

## 2020-01-08 DIAGNOSIS — E43 Unspecified severe protein-calorie malnutrition: Secondary | ICD-10-CM

## 2020-01-08 DIAGNOSIS — A310 Pulmonary mycobacterial infection: Secondary | ICD-10-CM | POA: Diagnosis not present

## 2020-01-08 LAB — CBC
HCT: 25.1 % — ABNORMAL LOW (ref 36.0–46.0)
Hemoglobin: 8 g/dL — ABNORMAL LOW (ref 12.0–15.0)
MCH: 30.2 pg (ref 26.0–34.0)
MCHC: 31.9 g/dL (ref 30.0–36.0)
MCV: 94.7 fL (ref 80.0–100.0)
Platelets: 148 10*3/uL — ABNORMAL LOW (ref 150–400)
RBC: 2.65 MIL/uL — ABNORMAL LOW (ref 3.87–5.11)
RDW: 14.9 % (ref 11.5–15.5)
WBC: 15.7 10*3/uL — ABNORMAL HIGH (ref 4.0–10.5)
nRBC: 0 % (ref 0.0–0.2)

## 2020-01-08 LAB — BLOOD GAS, ARTERIAL
Acid-Base Excess: 10.1 mmol/L — ABNORMAL HIGH (ref 0.0–2.0)
Bicarbonate: 36.4 mmol/L — ABNORMAL HIGH (ref 20.0–28.0)
Drawn by: 308601
FIO2: 40
O2 Content: 5 L/min
O2 Saturation: 95.5 %
Patient temperature: 98.6
pCO2 arterial: 65.4 mmHg (ref 32.0–48.0)
pH, Arterial: 7.365 (ref 7.350–7.450)
pO2, Arterial: 73.2 mmHg — ABNORMAL LOW (ref 83.0–108.0)

## 2020-01-08 LAB — PROCALCITONIN
Procalcitonin: 23.48 ng/mL
Procalcitonin: 17.8 ng/mL

## 2020-01-08 LAB — RETICULOCYTES
Immature Retic Fract: 7.1 % (ref 2.3–15.9)
RBC.: 2.56 MIL/uL — ABNORMAL LOW (ref 3.87–5.11)
Retic Count, Absolute: 49.5 10*3/uL (ref 19.0–186.0)
Retic Ct Pct: 1.9 % (ref 0.4–3.1)

## 2020-01-08 MED ORDER — SODIUM CHLORIDE 0.9% FLUSH
10.0000 mL | Freq: Two times a day (BID) | INTRAVENOUS | Status: DC
  Administered 2020-01-08 – 2020-01-12 (×8): 10 mL

## 2020-01-08 MED ORDER — ENSURE SURGERY PO LIQD
237.0000 mL | Freq: Three times a day (TID) | ORAL | Status: DC
  Administered 2020-01-08 – 2020-01-11 (×8): 237 mL via ORAL
  Filled 2020-01-08 (×14): qty 237

## 2020-01-08 MED ORDER — SODIUM CHLORIDE 0.9% FLUSH: 10.0000 mL | INTRAVENOUS | Status: DC | PRN

## 2020-01-08 MED ORDER — PIPERACILLIN-TAZOBACTAM 3.375 G IVPB
3.3750 g | Freq: Three times a day (TID) | INTRAVENOUS | Status: DC
  Administered 2020-01-08: 3.375 g via INTRAVENOUS
  Filled 2020-01-08: qty 50

## 2020-01-08 MED ORDER — IPRATROPIUM-ALBUTEROL 0.5-2.5 (3) MG/3ML IN SOLN: 3.0000 mL | Freq: Four times a day (QID) | RESPIRATORY_TRACT | Status: DC | PRN

## 2020-01-08 NOTE — NC FL2 (Signed)
[place] LEVEL OF CARE SCREENING TOOL     IDENTIFICATION  Patient Name: Olivia Hooper Birthdate: 11/07/1940 Sex: female Admission Date (Current Location): 01/04/2020  [facility] and [place] Number:  Herbalist and Address:  [facility],  [place], [place]      Provider Number: [id number]  Attending Physician Name and Address:  Kayleen Memos, DO  Relative Name and Phone Number:  Merita Norton (Daughter) [phone]    Current Level of Care: Hospital Recommended Level of Care: [place] Prior Approval Number:    Date Approved/Denied:   PASRR Number: [id number]  Discharge Plan: SNF    Current Diagnoses: Patient Active Problem List   Diagnosis Date Noted  . Closed left hip fracture, initial encounter ([place]) 01/04/2020  . Hair loss 10/23/2019  . Hyperparathyroidism ([place]) 10/23/2019  . Anorexia 10/23/2019  . Vaccine counseling 10/23/2019  . Pressure injury of skin 05/19/2019  . COVID-19 virus infection 05/18/2019  . Symptomatic anemia 05/17/2019  . AKI (acute kidney injury) ([place]) 05/17/2019  . Cardiomegaly 05/17/2019  . Anemia 05/03/2019  . Drug-induced anemia 05/03/2019  . Sleeping excessive 05/03/2019  . Pulmonary mycobacterial infection ([place]) 03/14/2019  . Cavitating mass in right upper lung lobe 01/31/2019  . S/P eye surgery 01/25/2015  . 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 Age-related macular degeneration, dry, both eyes 11/30/2014  . Pseudophakia of both eyes 11/30/2014  . Pulmonary nodules 03/18/2014  . Cigarette smoker 03/18/2014  . COPD GOLD II / still smoking  03/15/2014  . Profound fatigue 02/18/2011  . Encounter for long-term (current) use of other medications 01/08/2011  . Rheumatoid arthritis ([place]) 08/21/2009    Orientation RESPIRATION BLADDER Height & Weight     Self, Situation, Place  O2 (5L [place])  External catheter Weight: 36 kg Height:  5\' 1"  (154.9 cm)  BEHAVIORAL SYMPTOMS/MOOD NEUROLOGICAL BOWEL NUTRITION STATUS   (none)  (none) Continent Diet (see d/c summary)  AMBULATORY STATUS COMMUNICATION OF NEEDS Skin   Extensive Assist Verbally  (Pressure Injury, stage I R buttocks; L Hip surgical wound)                       Personal Care Assistance Level of Assistance  Bathing, Feeding, Dressing Bathing Assistance: Maximum assistance Feeding assistance: Independent Dressing Assistance: Limited assistance     Functional Limitations Info  Sight, Hearing, Speech Sight Info: Adequate Hearing Info: Adequate Speech Info: Adequate    SPECIAL CARE FACTORS FREQUENCY  PT (By licensed PT), OT (By licensed OT)     PT Frequency: 5X/W OT Frequency: 5X/W            Contractures Contractures Info: Not present    Additional Factors Info  Code Status, Allergies Code Status Info: full Allergies Info: Buprenorphine, Morphine           Current Medications (01/08/2020):  This is the current hospital active medication list Current Facility-Administered Medications  Medication Dose Route Frequency Provider Last Rate Last Admin  . acetaminophen (TYLENOL) tablet 325-650 mg  325-650 mg Oral Q6H PRN Swinteck, Aaron Edelman, MD      . ascorbic acid (VITAMIN C) tablet 500 mg  500 mg Oral BID Irene Pap N, DO   500 mg at 01/08/20 1029  . Chlorhexidine Gluconate Cloth 2 % PADS 6 each  6 each Topical Daily Kayleen Memos, DO   6 each at 01/08/20 1029  .  docusate sodium (COLACE) capsule 100 mg  100 mg Oral BID Rod Can, MD   100 mg at 01/08/20 1029  . enoxaparin (LOVENOX) injection 30 mg  30 mg Subcutaneous Q24H Rod Can, MD   30 mg at 01/08/20 0828  . feeding supplement (ENSURE SURGERY) liquid 237 mL  237 mL Oral TID BM Hall, Carole N, DO      . ferrous sulfate tablet 325 mg  325 mg Oral BID WC Hall, Carole N, DO   325 mg at 01/08/20 0827  . HYDROcodone-acetaminophen (NORCO) 7.5-325  MG per tablet 1-2 tablet  1-2 tablet Oral Q4H PRN Rod Can, MD   1 tablet at 01/07/20 1047  . HYDROcodone-acetaminophen (NORCO/VICODIN) 5-325 MG per tablet 1-2 tablet  1-2 tablet Oral Q4H PRN Swinteck, Aaron Edelman, MD      . ipratropium-albuterol (DUONEB) 0.5-2.5 (3) MG/3ML nebulizer solution 3 mL  3 mL Nebulization BID Irene Pap N, DO   3 mL at 01/08/20 0745  . lactated ringers infusion   Intravenous Continuous Kayleen Memos, DO 75 mL/hr at 01/07/20 2000 Rate Verify at 01/07/20 2000  . MEDLINE mouth rinse  15 mL Mouth Rinse BID Irene Pap N, DO   15 mL at 01/08/20 1030  . menthol-cetylpyridinium (CEPACOL) lozenge 3 mg  1 lozenge Oral PRN Swinteck, Aaron Edelman, MD       Or  . phenol (CHLORASEPTIC) mouth spray 1 spray  1 spray Mouth/Throat PRN Swinteck, Aaron Edelman, MD      . methocarbamol (ROBAXIN) tablet 500 mg  500 mg Oral Q6H PRN Swinteck, Aaron Edelman, MD       Or  . methocarbamol (ROBAXIN) 500 mg in dextrose 5 % 50 mL IVPB  500 mg Intravenous Q6H PRN Swinteck, Aaron Edelman, MD      . metoCLOPramide (REGLAN) tablet 5-10 mg  5-10 mg Oral Q8H PRN Swinteck, Aaron Edelman, MD       Or  . metoCLOPramide (REGLAN) injection 5-10 mg  5-10 mg Intravenous Q8H PRN Swinteck, Aaron Edelman, MD      . mometasone-formoterol (DULERA) 100-5 MCG/ACT inhaler 2 puff  2 puff Inhalation BID Irene Pap N, DO   2 puff at 01/08/20 0745  . morphine 2 MG/ML injection 0.5-1 mg  0.5-1 mg Intravenous Q2H PRN Rod Can, MD   1 mg at 01/05/20 2220  . multivitamin with minerals tablet 1 tablet  1 tablet Oral Daily Irene Pap N, DO   1 tablet at 01/08/20 1029  . ondansetron (ZOFRAN) tablet 4 mg  4 mg Oral Q6H PRN Swinteck, Aaron Edelman, MD       Or  . ondansetron [facility]) injection 4 mg  4 mg Intravenous Q6H PRN Rod Can, MD   4 mg at 01/05/20 2220  . piperacillin-tazobactam (ZOSYN) IVPB 3.375 g  3.375 g Intravenous Q8H Pham, Anh P, RPH 12.5 mL/hr at 01/08/20 1208 3.375 g at 01/08/20 1208  . senna (SENOKOT) tablet 8.6 mg  1 tablet Oral BID Rod Can, MD   8.6 mg at 01/08/20 1029  . senna-docusate (Senokot-S) tablet 1 tablet  1 tablet Oral QHS PRN Swinteck, Aaron Edelman, MD      . sodium chloride flush (NS) 0.9 % injection 10-40 mL  10-40 mL Intracatheter Q12H Hall, Carole N, DO   10 mL at 01/08/20 1030  . sodium chloride flush (NS) 0.9 % injection 10-40 mL  10-40 mL Intracatheter PRN Irene Pap N, DO      . sodium chloride flush (NS) 0.9 % injection 10-40 mL  10-40 mL Intracatheter  Q12H Irene Pap N, DO   10 mL at 01/08/20 1030  . sodium chloride flush (NS) 0.9 % injection 10-40 mL  10-40 mL Intracatheter PRN Kayleen Memos, DO         Discharge Medications: Please see discharge summary for a list of discharge medications.  Relevant Imaging Results:  Relevant Lab Results:   Additional Information [place], [place]

## 2020-01-08 NOTE — Progress Notes (Addendum)
At 12:45am, RN saw the patient removing her mask/Bipap. RN Explained the need to use the Bipap but patient refused. Patient also refused to wear the nasal cannula. O2 spot check 81% on RA. RN explained to the patient the need to use the O2 nasal cannula which she eventually agreed.  Patient also refused to continue/restart Lactated Ringers infusion.

## 2020-01-08 NOTE — TOC Initial Note (Signed)
Transition of Care [facility]) - Initial/Assessment Note    Patient Details  Name: Olivia Hooper MRN: [id number] Date of Birth: 11/25/1940  Transition of Care [facility]) CM/SW Contact:    Trish Mage, LCSW Phone Number: 01/08/2020, 1:37 PM  Clinical Narrative:    Patient seen in follow up to PT recommendation of short term rehab.  Mw Brindle states she is feeling "fair," acknowledges that she knows she currently needs more care than family can givce, and is open to going to SNF.  I explained the bed search process.  Bed search initiated.  TOC will continue to follow during the course of hospitalization.                Expected Discharge Plan: Skilled Nursing Facility Barriers to Discharge: SNF Pending bed offer   Patient Goals and CMS Choice     Choice offered to / list presented to : Patient  Expected Discharge Plan and Services Expected Discharge Plan: [place]   Discharge Planning Services: CM Consult Post Acute Care Choice: [place] Living arrangements for the past 2 months: Single Family Home                                      Prior Living Arrangements/Services Living arrangements for the past 2 months: Single Family Home Lives with:: Adult Children Patient language and need for interpreter reviewed:: Yes        Need for Family Participation in Patient Care: Yes (Comment) Care giver support system in place?: Yes (comment) Current home services: DME Criminal Activity/Legal Involvement Pertinent to Current Situation/Hospitalization: No - Comment as needed  Activities of Daily Living Home Assistive Devices/Equipment: Bedside commode/3-in-1 ADL Screening (condition at time of admission) Patient's cognitive ability adequate to safely complete daily activities?: Yes Is the patient deaf or have difficulty hearing?: No Does the patient have difficulty seeing, even when wearing glasses/contacts?: No Does the patient have difficulty  concentrating, remembering, or making decisions?: No Patient able to express need for assistance with ADLs?: No Does the patient have difficulty dressing or bathing?: No Independently performs ADLs?: Yes (appropriate for developmental age) Does the patient have difficulty walking or climbing stairs?: No Weakness of Legs: None Weakness of Arms/Hands: None  Permission Sought/Granted                  Emotional Assessment Appearance:: Appears stated age Attitude/Demeanor/Rapport: Engaged Affect (typically observed): Appropriate Orientation: : Oriented to Self, Oriented to Place, Oriented to Situation Alcohol / Substance Use: Not Applicable Psych Involvement: No (comment)  Admission diagnosis:  Femur fracture, left ([place]) [S72.92XA] Closed left hip fracture, initial encounter ([place]) [S72.002A] Patient Active Problem List   Diagnosis Date Noted  . Closed left hip fracture, initial encounter ([place]) 01/04/2020  . Hair loss 10/23/2019  . Hyperparathyroidism ([place]) 10/23/2019  . Anorexia 10/23/2019  . Vaccine counseling 10/23/2019  . Pressure injury of skin 05/19/2019  . COVID-19 virus infection 05/18/2019  . Symptomatic anemia 05/17/2019  . AKI (acute kidney injury) ([place]) 05/17/2019  . Cardiomegaly 05/17/2019  . Anemia 05/03/2019  . Drug-induced anemia 05/03/2019  . Sleeping excessive 05/03/2019  . Pulmonary mycobacterial infection ([place]) 03/14/2019  . Cavitating mass in right upper lung lobe 01/31/2019  . S/P eye surgery 01/25/2015  . 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 Age-related macular degeneration, dry, both eyes 11/30/2014  .  Pseudophakia of both eyes 11/30/2014  . Pulmonary nodules 03/18/2014  . Cigarette smoker 03/18/2014  . COPD GOLD II / still smoking  03/15/2014  . Profound fatigue 02/18/2011  . Encounter for long-term (current) use of other medications 01/08/2011  . Rheumatoid arthritis  ([place]) 08/21/2009   PCP:  [name], MD Pharmacy:   [facility] [address], [place] [address] Phone: [phone] Fax: [phone]     Social Determinants of Health (SDOH) Interventions    Readmission Risk Interventions No flowsheet data found.

## 2020-01-08 NOTE — Progress Notes (Signed)
ABG critical result PCO2 65.4. [place], NP notified.

## 2020-01-08 NOTE — Progress Notes (Signed)
Pharmacy Antibiotic Note  Olivia Hooper is a 79 y.o. female with hx mycobacterium abscessus lung infection (f/u with Dr. Lucianne Lei dam at [facility]), presented to the ED on 01/04/20 with c/o left hip pain s/p fall the day before. She was found to have left intertrochanteric femur fracture and underwent intramedullary fixation of left femur on 10/29. WBC and PCT trended up on 11/1. Pharmacy is consulted to start zosyn for empiric coverage.   Plan: - zosyn 3.375 gm IV q8h (infuse over 4 hrs)  ______________________________________  Height: 5\' 1"  (154.9 cm) Weight: 36 kg (79 lb 5.9 oz) IBW/kg (Calculated) : 47.8  Temp (24hrs), Avg:99.2 F (37.3 C), Min:98.4 F (36.9 C), Max:100.1 F (37.8 C)  Recent Labs  Lab 01/04/20 1321 01/05/20 2128 01/06/20 0251 01/07/20 0333 01/08/20 0537  WBC 12.4* 13.8* 12.7* 11.1* 15.7*  CREATININE 0.80 0.75 0.76 0.62  --     Estimated Creatinine Clearance: 31.9 mL/min (by C-G formula based on SCr of 0.62 mg/dL).    Allergies  Allergen Reactions  . Buprenorphine Hcl Nausea And Vomiting  . Morphine And Related Nausea And Vomiting         Thank you for allowing pharmacy to be a part of this patient's care.  Lynelle Doctor 01/08/2020 11:13 AM

## 2020-01-08 NOTE — Progress Notes (Addendum)
PROGRESS NOTE  Olivia Hooper AQT:[id number] DOB: 05-30-1940 DOA: 01/04/2020 PCP: [name], MD  HPI/Recap of past 24 hours: This is an 79 year old female with past medical history of chronic hypoxic respiratory failure on 2 L at baseline, COPD, rheumatoid arthritis, left breast cancer status post mastectomy, osteoporosis, hypertension, hyperlipidemia, Mycobacterium abscessus lung infection s/p treatment who presented to the ED with chief complaint of fall.  She went to walk outside and fell down approximately 4 steps onto her left hip onto the concrete.  Believes that she tripped over some carpeting which made her fall.  No loss of consciousness, did not hit her head, not on blood thinners.  No prodromal symptoms.  No other complaints at this time.  Currently has some left hip discomfort but otherwise no issues  ED Course: Afebrile and hemodynamically stable on baseline 2 LPM.  Labs notable for leukocytosis Hb 8.3 (previously 9.7 in April).  Left hip x-ray positive for left femur subtrochanteric fracture with extension to greater and lesser trochanters.  Orthopedic surgery was consulted by the ED.  Post Left femur intramedullary fixation on 01/05/2020.  Postsurgical hypoxia and hypercarbia with pH of 7.2 and PCO2 of 73.  Placed on BiPAP with improvement of her acidosis and hypercarbia.  01/08/20:  Seen and examined with her son at bedside.  Has moderate pain in her left hip prior to taking her pain medication.    Assessment/Plan: Principal Problem:   Closed left hip fracture, initial encounter [facility]) Active Problems:   COPD GOLD II / still smoking    Rheumatoid arthritis ([place])   Pulmonary mycobacterial infection ([place])   Anemia  Left hip fracture status post intramedullary fixation of left femur on 01/05/2020 by Dr. Lyla Glassing Pain control DVT prophylaxis Monitor H&H PT OT assessment with orthopedic surgeries guidance Fall precautions [facility] consult for possible SNF placement  Sepsis  secondary to Mycobacterium abscesses with exacerbation, POA History of Mycobacterium abscesses, follows with [facility]. Wendie [facility]reste [facility] Heart rate 110, RR 22, WBC 15.7K Her procalcitonin is uptrending pretty quickly from 1.42 on 01/05/2020 to 23.48 on 01/08/2020. Leukocytosis with WBC 15.7 from 11.1 T-max 99. Obtain sputum culture and blood cultures peripherally Start IV Zosyn and continue IV fluids to maintain MAP greater than 65 ID has been consulted.  Acute on chronic hypoxic hypercarbic respiratory failure initially requiring BIPAP, improving, likely 2/2 to atelectasis with underlying COPD and Mycobacterium abscesses On 2 L oxygen continuously by nasal cannula at baseline Became acutely hypoxic and hypercarbic after surgery with ABG revealing pH 7.2 and PCO2 of 73 Continue BiPAP at night-noncompliant Repeated ABG this morning showing worsening PCO2 65 from 64, pH normal and compensated.   May need NIV oupatient.  Possibly chronic CO2 retainer-Pulmonary consult to guide in that decision.   Continue to closely monitor.  Maintain oxygen saturation greater than 90%.  Iron deficiency anemia of unclear etiology Iron studies consistent with severe iron deficiency Baseline hemoglobin appears to be 9.7 with MCV of 89 Will obtain reticulocyte count to see if this is a bone marrow issue Obtain FOBT. Continue ferrous sulfate 325 mg twice daily Transfused 2 unit PRBC perioperatively Continue to monitor H&H  Acute blood loss anemia post orthopedic surgery Management as stated above Drop in hemoglobin, 8.0 from 9.4 No overt bleeding Continue to monitor H&H.  History of mycoplasma/right upper lobe lung abscesses Follows with ID  COPD C/w home bronchodilators Continue continuous oxygen supplementation  Severe protein calorie malnutrition BMI 15 Albumin 2.3 Oral supplement Dietitian consult  Code Status: Full code  Family Communication: Updated her son at bedside on  01/08/2020  Disposition Plan: SNF for rehab.   Consultants:  Orthopedics  Procedures:  Left hip repair on 01/05/2020 by Dr. Lyla Glassing.  Antimicrobials:  None  DVT prophylaxis: Subcu Lovenox daily  Status is: Inpatient   Dispo:  Patient From: Home  Planned Disposition: [place]  Expected discharge date: 01/08/20  Medically stable for discharge: No, ongoing management of medical condition perioperatively.         Objective: Vitals:   01/07/20 2328 01/08/20 0242 01/08/20 0629 01/08/20 1026  BP:  (!) 112/44 (!) 99/44 (!) 116/43  Pulse:  89 81 92  Resp: 20 18 16 16   Temp:  98.9 F (37.2 C) 98.4 F (36.9 C) 99 F (37.2 C)  TempSrc:  Oral Oral Oral  SpO2:  99% 99% 99%  Weight:      Height:        Intake/Output Summary (Last 24 hours) at 01/08/2020 1048 Last data filed at 01/08/2020 0900 Gross per 24 hour  Intake 1906.85 ml  Output 450 ml  Net 1456.85 ml   Filed Weights   01/04/20 1227  Weight: 36 kg    Exam:  . General: 79 y.o. year-old female frail-appearing in no acute distress.  Alert and interactive.   . Cardiovascular: Regular rate and rhythm no rubs or gallops. Marland Kitchen Respiratory: Diffuse rales bilaterally no wheezing noted. .  Abdomen: Soft nontender normal bowel sounds present. . Musculoskeletal: No lower extremity edema bilaterally. Marland Kitchen Psychiatry: Mood is appropriate for condition and setting.  Data Reviewed: CBC: Recent Labs  Lab 01/04/20 1321 01/05/20 2128 01/06/20 0251 01/07/20 0333 01/08/20 0537  WBC 12.4* 13.8* 12.7* 11.1* 15.7*  NEUTROABS  --  11.3*  --   --   --   HGB 8.3* 11.4* 10.8* 9.4* 8.0*  HCT 26.4* 34.4* 32.5* 30.0* 25.1*  MCV 95.0 91.5 92.1 94.9 94.7  PLT 299 211 206 167 594*   Basic Metabolic Panel: Recent Labs  Lab 01/04/20 1321 01/05/20 2128 01/06/20 0251 01/07/20 0333  NA 137 136 138 137  K 4.3 4.7 4.5 4.0  CL 97* 97* 98 97*  CO2 32 29 32 33*  GLUCOSE 85 339* 154* 148*  BUN 21 18 19 20    CREATININE 0.80 0.75 0.76 0.62  CALCIUM 9.3 9.0 9.4 9.3  MG  --  1.7  --   --   PHOS  --  3.5  --   --    GFR: Estimated Creatinine Clearance: 31.9 mL/min (by C-G formula based on SCr of 0.62 mg/dL). Liver Function Tests: Recent Labs  Lab 01/04/20 1321 01/05/20 2128  AST 14* 13*  ALT 13 13  ALKPHOS 85 66  BILITOT 0.7 0.6  PROT 5.7* 5.2*  ALBUMIN 2.3* 2.0*   No results for input(s): LIPASE, AMYLASE in the last 168 hours. No results for input(s): AMMONIA in the last 168 hours. Coagulation Profile: No results for input(s): INR, PROTIME in the last 168 hours. Cardiac Enzymes: No results for input(s): CKTOTAL, CKMB, CKMBINDEX, TROPONINI in the last 168 hours. BNP (last 3 results) No results for input(s): PROBNP in the last 8760 hours. HbA1C: No results for input(s): HGBA1C in the last 72 hours. CBG: No results for input(s): GLUCAP in the last 168 hours. Lipid Profile: No results for input(s): CHOL, HDL, LDLCALC, TRIG, CHOLHDL, LDLDIRECT in the last 72 hours. Thyroid Function Tests: No results for input(s): TSH, T4TOTAL, FREET4, T3FREE, THYROIDAB in the last  72 hours. Anemia Panel: No results for input(s): VITAMINB12, FOLATE, FERRITIN, TIBC, IRON, RETICCTPCT in the last 72 hours. Urine analysis:    Component Value Date/Time   COLORURINE YELLOW 05/18/2019 1712   APPEARANCEUR CLEAR 05/18/2019 1712   LABSPEC 1.017 05/18/2019 1712   PHURINE 5.0 05/18/2019 1712   GLUCOSEU NEGATIVE 05/18/2019 1712   HGBUR NEGATIVE 05/18/2019 1712   BILIRUBINUR NEGATIVE 05/18/2019 1712   KETONESUR NEGATIVE 05/18/2019 1712   PROTEINUR 30 (A) 05/18/2019 1712   NITRITE NEGATIVE 05/18/2019 1712   LEUKOCYTESUR NEGATIVE 05/18/2019 1712   Sepsis Labs: @LABRCNTIP (procalcitonin:4,lacticidven:4)  ) Recent Results (from the past 240 hour(s))  Respiratory Panel by RT PCR (Flu A&B, Covid) - Nasopharyngeal Swab     Status: None   Collection Time: 01/04/20  1:46 PM   Specimen: Nasopharyngeal Swab   Result Value Ref Range Status   SARS Coronavirus 2 by RT PCR NEGATIVE NEGATIVE Final    Comment: (NOTE) SARS-CoV-2 target nucleic acids are NOT DETECTED.  The SARS-CoV-2 RNA is generally detectable in upper respiratoy specimens during the acute phase of infection. The lowest concentration of SARS-CoV-2 viral copies this assay can detect is 131 copies/mL. A negative result does not preclude SARS-Cov-2 infection and should not be used as the sole basis for treatment or other patient management decisions. A negative result may occur with  improper specimen collection/handling, submission of specimen other than nasopharyngeal swab, presence of viral mutation(s) within the areas targeted by this assay, and inadequate number of viral copies (<131 copies/mL). A negative result must be combined with clinical observations, patient history, and epidemiological information. The expected result is Negative.  Fact Sheet for Patients:  PinkCheek.be  Fact Sheet for Healthcare Providers:  GravelBags.it  This test is no t yet approved or cleared by the [place] FDA and  has been authorized for detection and/or diagnosis of SARS-CoV-2 by FDA under an Emergency Use Authorization (EUA). This EUA will remain  in effect (meaning this test can be used) for the duration of the COVID-19 declaration under Section 564(b)(1) of the Act, 21 U.S.C. section 360bbb-3(b)(1), unless the authorization is terminated or revoked sooner.     Influenza A by PCR NEGATIVE NEGATIVE Final   Influenza B by PCR NEGATIVE NEGATIVE Final    Comment: (NOTE) The Xpert Xpress SARS-CoV-2/FLU/RSV assay is intended as an aid in  the diagnosis of influenza from Nasopharyngeal swab specimens and  should not be used as a sole basis for treatment. Nasal washings and  aspirates are unacceptable for Xpert Xpress SARS-CoV-2/FLU/RSV  testing.  Fact Sheet for  Patients: PinkCheek.be  Fact Sheet for Healthcare Providers: GravelBags.it  This test is not yet approved or cleared by the [place] FDA and  has been authorized for detection and/or diagnosis of SARS-CoV-2 by  FDA under an Emergency Use Authorization (EUA). This EUA will remain  in effect (meaning this test can be used) for the duration of the  Covid-19 declaration under Section 564(b)(1) of the Act, 21  U.S.C. section 360bbb-3(b)(1), unless the authorization is  terminated or revoked. Performed at [facility], [place] [address], [place], [place] [address]   Surgical PCR screen     Status: None   Collection Time: 01/04/20  8:49 PM   Specimen: Nasal Mucosa; Nasal Swab  Result Value Ref Range Status   MRSA, PCR NEGATIVE NEGATIVE Final   Staphylococcus aureus NEGATIVE NEGATIVE Final    Comment: (NOTE) The Xpert SA Assay (FDA approved for NASAL specimens in patients 22  years of age and older), is one component of a comprehensive surveillance program. It is not intended to diagnose infection nor to guide or monitor treatment. Performed at [facility], [place] [address], [place], [place] [address]       Studies: No results found.  Scheduled Meds: . ascorbic acid  500 mg Oral BID  . Chlorhexidine Gluconate Cloth  6 each Topical Daily  . docusate sodium  100 mg Oral BID  . enoxaparin (LOVENOX) injection  30 mg Subcutaneous Q24H  . feeding supplement  1 Container Oral Q24H  . feeding supplement  237 mL Oral BID BM  . ferrous sulfate  325 mg Oral BID WC  . ipratropium-albuterol  3 mL Nebulization BID  . mouth rinse  15 mL Mouth Rinse BID  . mometasone-formoterol  2 puff Inhalation BID  . multivitamin with minerals  1 tablet Oral Daily  . senna  1 tablet Oral BID  . sodium chloride flush  10-40 mL Intracatheter Q12H  . sodium chloride flush  10-40 mL Intracatheter Q12H     Continuous Infusions: . lactated ringers 75 mL/hr at 01/07/20 2000  . methocarbamol (ROBAXIN) IV       LOS: 4 days     Kayleen Memos, MD [facility] Pager [phone]  If 7PM-7AM, please contact night-coverage [email or web address] Password [facility] 01/08/2020, 10:48 AM

## 2020-01-08 NOTE — Consult Note (Signed)
NAME:  Olivia Hooper, MRN:  [id number], DOB:  [birth year], LOS: 4 ADMISSION DATE:  01/04/2020, CONSULTATION DATE: 11/1 REFERRING MD:  Dr. Nevada Crane, CHIEF COMPLAINT:  Hypercarbia    Brief History   79 y/o F admitted 10/28 with fall and subsequent left femur fracture.  The patient had post operative hypoxia & hypercarbia.  PCCM consulted for evaluation.   History of present illness   79 y/o F admitted 10/28 after mechanical fall with subsequent left femur fracture.  Patient reported on presentation that she tripped over carpet going up stairs in the home and fell.  She did not have loss of consciousness or hit her head.  Evaluation was notable for a left subtrochanteric femur fracture with extension to the greater and lesser trochanters.  Dr. Rod Can was consulted from orthopedics and the patient underwent a left intramedullary femur fixation on 01/05/2020.  In the postoperative period the patient was noted to have postsurgical hypoxemia.  ABG was evaluated with a pH of 7.2 and PCO2 of 73.  She was treated with BiPAP with improvement in her hypercarbia and acidosis.  At baseline the patient reports she began smoking at age 1 at her heaviest smoked up to 1 pack/day.  Her husband was a smoker as well.  She did not have occupational exposures.  She reports she had a wood-burning stove growing up but currently does not.  She states that she continues to smoke intermittently.  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  She reports she has lost a lot of weight since being diagnosed with Mycobacterium.  At baseline she is on 1.5 to 2 L oxygen.  She has rheumatoid arthritis and is on prednisone 5 mg twice daily as needed for pain.  She appears to be followed by her primary care provider for all medical issues mentioned above.   Past Medical History  Vertigo  RA - on BID PRN 5mg  prednisone Pulmonary Mycobacterial Infection  COPD  Breast Cancer -left    Hyperparathyroidism  HLD  Anorexia, Weight Loss   Significant Hospital Events   10/29 Admit with L hip fracture, s/p intramedullary fixation by Dr. Lyla Glassing   Consults:    Procedures:    Significant Diagnostic Tests:    Micro Data:  COVID 10/28 >>  Influenza A/B 10/28 >> negative  BCx2 11/1 >>   Antimicrobials:  Zosyn 11/1 >>   Interim history/subjective:  As above  Afebrile  Pt reports she feels tired, left hip hurts   Objective   Blood pressure (!) 111/56, pulse 92, temperature 97.7 F (36.5 C), resp. rate 15, height 5\' 1"  (1.549 m), weight 36 kg, SpO2 99 %.        Intake/Output Summary (Last 24 hours) at 01/08/2020 1616 Last data filed at 01/08/2020 1300 Gross per 24 hour  Intake 1909.85 ml  Output --  Net 1909.85 ml   Filed Weights   01/04/20 1227  Weight: 36 kg    Examination: General: thin, frail adult female lying in bed in NAD HEENT: MM pink/moist, [place] O2, anicteric  Neuro: AAOx4, pleasant, MAE CV: s1s2 RRR, no m/r/g PULM: non-labored on [place] O2, no wheezing, few bibasilar crackles. Significant kyphosis noted.  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  She does not endorse snoring and has a BMI of 15.  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  Baseline 1.5-2L O2.   -PRN bipap while inpatient / QHS  -wean O2 for sats 88-94%, O2 reduced to 3L (discussed with RN) -follow up CXR now  -ensure pulmonary hygiene - IS, flutter  -outpatient pulmonary follow up arranged for COPD evaluation and possible sleep evaluation -would not plan for BiPAP  at discharge at this time   Hx Mycobacterium Pulmonary Infection  Followed by Dr. Tommy Medal -repeat CXR & consider stopping zosyn, defer to ID  -repeat PCT, ? If elevated due to baseline mycobacterium vs surgical "trauma"  Best practice:  Diet: Regular  Mobility: As toleraetd  Code Status: Full Code  Family Communication: Patient updated on plan of care 11/1 Disposition: per [facility]   Labs   CBC: Recent Labs  Lab 01/04/20 1321 01/05/20 2128 01/06/20 0251 01/07/20 0333 01/08/20 0537  WBC 12.4* 13.8* 12.7* 11.1* 15.7*  NEUTROABS  --  11.3*  --   --   --   HGB 8.3* 11.4* 10.8* 9.4* 8.0*  HCT 26.4* 34.4* 32.5* 30.0* 25.1*  MCV 95.0 91.5 92.1 94.9 94.7  PLT 299 211 206 167 148*    Basic Metabolic Panel: Recent Labs  Lab 01/04/20 1321 01/05/20 2128 01/06/20 0251 01/07/20 0333  NA 137 136 138 137  K 4.3 4.7 4.5 4.0  CL 97* 97* 98 97*  CO2 32 29 32 33*  GLUCOSE 85 339* 154* 148*  BUN 21 18 19 20   CREATININE 0.80 0.75 0.76 0.62  CALCIUM 9.3 9.0 9.4 9.3  MG  --  1.7  --   --   PHOS  --  3.5  --   --    GFR: Estimated Creatinine Clearance: 31.9 mL/min (by C-G formula based on SCr of 0.62 mg/dL). Recent Labs  Lab 01/05/20 2128 01/06/20 0251 01/07/20 0333 01/08/20 0537 01/08/20 0752  PROCALCITON 1.42  --   --   --  23.48  WBC 13.8* 12.7* 11.1* 15.7*  --     Liver Function Tests: Recent Labs  Lab 01/04/20 1321 01/05/20 2128  AST 14* 13*  ALT 13 13  ALKPHOS 85 66  BILITOT 0.7 0.6  PROT 5.7* 5.2*  ALBUMIN 2.3* 2.0*   No results for input(s): LIPASE, AMYLASE in the last 168 hours. No results for input(s): AMMONIA in the last 168 hours.  ABG    Component Value Date/Time   PHART 7.365 01/08/2020 0604   PCO2ART 65.4 (HH) 01/08/2020 0604   PO2ART 73.2 (L) 01/08/2020 0604   HCO3 36.4 (H) 01/08/2020 0604   O2SAT 95.5 01/08/2020 0604     Coagulation Profile: No results for input(s): INR, PROTIME in the last 168 hours.  Cardiac Enzymes: No results for  input(s): CKTOTAL, CKMB, CKMBINDEX, TROPONINI in the last 168 hours.  HbA1C: No results found for: HGBA1C  CBG: No results for input(s): GLUCAP in the last 168 hours.  Review of Systems:   Gen: Denies fever, chills, weight change, fatigue, night sweats HEENT: Denies blurred vision, double vision, hearing loss, tinnitus, sinus congestion, rhinorrhea, sore throat, neck stiffness, dysphagia PULM: Denies shortness of breath, cough, sputum production, hemoptysis, wheezing CV: Denies chest pain, edema, orthopnea, paroxysmal nocturnal dyspnea, palpitations GI: Denies abdominal pain, nausea, vomiting, diarrhea, hematochezia, melena, constipation, change in bowel habits GU: Denies  dysuria, hematuria, polyuria, oliguria, urethral discharge Endocrine: Denies hot or cold intolerance, polyuria, polyphagia or appetite change Derm: Denies rash, dry skin, scaling or peeling skin change Heme: Denies easy bruising, bleeding, bleeding gums Neuro: Denies headache, numbness, weakness, slurred speech, loss of memory or consciousness MSK: Left hip pain   Past Medical History  She,  has a past medical history of Anemia (05/03/2019), Anorexia (10/23/2019), Breast cancer ([place]), Carpal tunnel syndrome, COPD (chronic obstructive pulmonary disease) ([place]), Drug-induced anemia (05/03/2019), Dyspnea, Hair loss (10/23/2019), Hypercalcemia, Hyperlipidemia, Hyperparathyroidism ([place]), Hyperparathyroidism ([place]) (10/23/2019), Osteoporosis, Pneumonia, Pulmonary mycobacterial infection ([place]) (03/14/2019), RA (rheumatoid arthritis) ([place]), Sleeping excessive (05/03/2019), Vaccine counseling (10/23/2019), Vertigo, and Vitamin D deficiency.   Surgical History    Past Surgical History:  Procedure Laterality Date  . CARPAL TUNNEL RELEASE    . CATARACT EXTRACTION    . IR IMAGING GUIDED PORT INSERTION  04/05/2019  . MASTECTOMY Left   . VESICOVAGINAL FISTULA CLOSURE W/ TAH    . VIDEO BRONCHOSCOPY WITH ENDOBRONCHIAL ULTRASOUND N/A 01/18/2019     Procedure: VIDEO BRONCHOSCOPY WITH ENDOBRONCHIAL ULTRASOUND;  Surgeon: Candee Furbish, MD;  Location: [facility];  Service: Thoracic;  Laterality: N/A;     Social History   reports that she has been smoking cigarettes. She has a 31.00 pack-year smoking history. She has never used smokeless tobacco. She reports current alcohol use. She reports that she does not use drugs.   Family History   Her family history includes Bone cancer in her father; Colon cancer in her mother; Emphysema in her father; Heart disease in her father and mother; Kidney cancer in her brother; Melanoma in her sister.   Allergies Allergies  Allergen Reactions  . Buprenorphine Hcl Nausea And Vomiting  . Morphine And Related Nausea And Vomiting          Home Medications  Prior to Admission medications   Medication Sig Start Date End Date Taking? Authorizing Provider  acetaminophen (TYLENOL) 500 MG tablet Take 1,000 mg by mouth every 8 (eight) hours as needed for mild pain.    Yes [provider]  albuterol (VENTOLIN HFA) 108 (90 Base) MCG/ACT inhaler Inhale 2 puffs into the lungs every 6 (six) hours as needed for wheezing or shortness of breath.    Yes [provider]  ascorbic acid (VITAMIN C) 500 MG tablet Take 1 tablet (500 mg total) by mouth 2 (two) times daily. 05/22/19  Yes Hosie Poisson, MD  guaiFENesin-dextromethorphan (ROBITUSSIN DM) 100-10 MG/5ML syrup Take 5 mLs by mouth every 4 (four) hours as needed for cough. 05/22/19  Yes Hosie Poisson, MD  Ipratropium-Albuterol (COMBIVENT) 20-100 MCG/ACT AERS respimat Inhale 2 puffs into the lungs every 6 (six) hours. 05/22/19  Yes Hosie Poisson, MD  Lactobacillus (PROBIOTIC ACIDOPHILUS PO) Take 1 tablet by mouth daily.   Yes [provider]  lidocaine-prilocaine (EMLA) cream Apply 1 application topically as needed. Patient taking differently: Apply 1 application topically daily as needed (pain).  11/23/19  Yes Tommy Medal, Lavell Islam, MD   mometasone-formoterol [facility]) 100-5 MCG/ACT AERO Inhale 2 puffs into the lungs 2 (two) times daily as needed for wheezing or shortness of breath.    Yes [provider]  predniSONE (DELTASONE) 5 MG tablet Take 5 mg by mouth 2 (two) times daily as needed (for pain).   Yes [provider]  traMADol (ULTRAM) 50 MG tablet Take 50 mg by mouth 2 (two) times daily as needed for moderate pain.    Yes [provider]  zinc  gluconate 50 MG tablet Take 50 mg by mouth daily.   Yes [provider]  AMBULATORY NON FORMULARY MEDICATION Take 100 mg by mouth daily. Medication Name: clofazimine for mycobacterium abscessus pulmonary infection Patient not taking: Reported on 01/04/2020 03/27/19   Kuppelweiser, [name] L, RPH-CPP  cefOXitin 8 g in dextrose 5 % 50 mL Inject 8 g into the vein continuous. Patient not taking: Reported on 01/04/2020 06/01/19   Kuppelweiser, [name] L, RPH-CPP  diphenoxylate-atropine (LOMOTIL) 2.5-0.025 MG/5ML liquid Take 5 mLs by mouth 4 (four) times daily as needed for diarrhea or loose stools. Patient not taking: Reported on 01/04/2020 09/06/19   Tommy Medal, Lavell Islam, MD  enoxaparin (LOVENOX) 30 MG/0.3ML injection Inject 0.3 mLs (30 mg total) into the skin daily. 01/06/20   Swinteck, Aaron Edelman, MD  fluconazole (DIFLUCAN) 100 MG tablet Take 2 tablets (200 mg total) by mouth daily. Patient not taking: Reported on 01/04/2020 06/05/19   Tommy Medal, Lavell Islam, MD  HYDROcodone-acetaminophen (NORCO/VICODIN) 5-325 MG tablet Take 1 tablet by mouth every 4 (four) hours as needed for moderate pain (pain score 4-6). 01/06/20   Swinteck, Aaron Edelman, MD  linezolid (ZYVOX) 600 MG tablet Take 1 tablet (600 mg total) by mouth daily. Patient not taking: Reported on 01/04/2020 09/18/19   Kuppelweiser, [name] L, RPH-CPP  mupirocin ointment (BACTROBAN) 2 % Apply 1 application topically 2 (two) times daily. Patient not taking: Reported on 01/04/2020 05/01/19   Vianny Slade, DPM  zinc  sulfate 220 (50 Zn) MG capsule Take 1 capsule (220 mg total) by mouth daily. Patient not taking: Reported on 01/04/2020 05/23/19   Hosie Poisson, MD     Critical care time: n/a     Noe Gens, MSN, NP-C, AGACNP-BC [facility] 01/08/2020, 4:16 PM   Please see [email or web address] for pager details.

## 2020-01-08 NOTE — Anesthesia Postprocedure Evaluation (Signed)
Anesthesia Post Note  Patient: Olivia Hooper  Procedure(s) Performed: INTRAMEDULLARY (IM) NAIL LEFT  FEMORAL (Left )     Patient location during evaluation: PACU Anesthesia Type: General Level of consciousness: awake and alert Pain management: pain level controlled Vital Signs Assessment: post-procedure vital signs reviewed and stable Respiratory status: spontaneous breathing, nonlabored ventilation, respiratory function stable, patient connected to nasal cannula oxygen and patient connected to face mask oxygen Cardiovascular status: blood pressure returned to baseline and stable Postop Assessment: no apparent nausea or vomiting Anesthetic complications: no Comments: Poor baseline pulmonary status necessitating mask O2 in recovery with delayed emergence given frail state and advanced age.    No complications documented.  Last Vitals:  Vitals:   01/08/20 0242 01/08/20 0629  BP: (!) 112/44 (!) 99/44  Pulse: 89 81  Resp: 18 16  Temp: 37.2 C 36.9 C  SpO2: 99% 99%    Last Pain:  Vitals:   01/08/20 0629  TempSrc: Oral  PainSc:                  March Rummage Arber Wiemers

## 2020-01-08 NOTE — Plan of Care (Signed)
Problem: Education: Goal: Knowledge of General Education information will improve Description: Including pain rating scale, medication(s)/side effects and non-pharmacologic comfort measures 01/08/2020 0011 by Normand Sloop, RN Outcome: Progressing 01/08/2020 0011 by Normand Sloop, RN Outcome: Progressing   Problem: Health Behavior/Discharge Planning: Goal: Ability to manage health-related needs will improve 01/08/2020 0011 by Normand Sloop, RN Outcome: Progressing 01/08/2020 0011 by Normand Sloop, RN Outcome: Progressing   Problem: Clinical Measurements: Goal: Ability to maintain clinical measurements within normal limits will improve 01/08/2020 0011 by Normand Sloop, RN Outcome: Progressing 01/08/2020 0011 by Normand Sloop, RN Outcome: Progressing Goal: Will remain free from infection 01/08/2020 0011 by Normand Sloop, RN Outcome: Progressing 01/08/2020 0011 by Normand Sloop, RN Outcome: Progressing Goal: Diagnostic test results will improve 01/08/2020 0011 by Normand Sloop, RN Outcome: Progressing 01/08/2020 0011 by Normand Sloop, RN Outcome: Progressing Goal: Respiratory complications will improve 01/08/2020 0011 by Normand Sloop, RN Outcome: Progressing 01/08/2020 0011 by Normand Sloop, RN Outcome: Progressing Goal: Cardiovascular complication will be avoided 01/08/2020 0011 by Normand Sloop, RN Outcome: Progressing 01/08/2020 0011 by Normand Sloop, RN Outcome: Progressing   Problem: Activity: Goal: Risk for activity intolerance will decrease 01/08/2020 0011 by Normand Sloop, RN Outcome: Progressing 01/08/2020 0011 by Normand Sloop, RN Outcome: Progressing   Problem: Nutrition: Goal: Adequate nutrition will be maintained 01/08/2020 0011 by Normand Sloop, RN Outcome: Progressing 01/08/2020 0011 by Normand Sloop, RN Outcome: Progressing   Problem: Coping: Goal: Level of anxiety will decrease 01/08/2020 0011 by Normand Sloop, RN Outcome:  Progressing 01/08/2020 0011 by Normand Sloop, RN Outcome: Progressing   Problem: Elimination: Goal: Will not experience complications related to bowel motility 01/08/2020 0011 by Normand Sloop, RN Outcome: Progressing 01/08/2020 0011 by Normand Sloop, RN Outcome: Progressing Goal: Will not experience complications related to urinary retention 01/08/2020 0011 by Normand Sloop, RN Outcome: Progressing 01/08/2020 0011 by Normand Sloop, RN Outcome: Progressing   Problem: Pain Managment: Goal: General experience of comfort will improve 01/08/2020 0011 by Normand Sloop, RN Outcome: Progressing 01/08/2020 0011 by Normand Sloop, RN Outcome: Progressing   Problem: Safety: Goal: Ability to remain free from injury will improve 01/08/2020 0011 by Normand Sloop, RN Outcome: Progressing 01/08/2020 0011 by Normand Sloop, RN Outcome: Progressing   Problem: Skin Integrity: Goal: Risk for impaired skin integrity will decrease 01/08/2020 0011 by Normand Sloop, RN Outcome: Progressing 01/08/2020 0011 by Normand Sloop, RN Outcome: Progressing   Problem: Education: Goal: Verbalization of understanding the information provided (i.e., activity precautions, restrictions, etc) will improve 01/08/2020 0011 by Normand Sloop, RN Outcome: Progressing 01/08/2020 0011 by Normand Sloop, RN Outcome: Progressing Goal: Individualized Educational Video(s) 01/08/2020 0011 by Normand Sloop, RN Outcome: Progressing 01/08/2020 0011 by Normand Sloop, RN Outcome: Progressing   Problem: Activity: Goal: Ability to ambulate and perform ADLs will improve 01/08/2020 0011 by Normand Sloop, RN Outcome: Progressing 01/08/2020 0011 by Normand Sloop, RN Outcome: Progressing   Problem: Clinical Measurements: Goal: Postoperative complications will be avoided or minimized 01/08/2020 0011 by Normand Sloop, RN Outcome: Progressing 01/08/2020 0011 by Normand Sloop, RN Outcome: Progressing    Problem: Self-Concept: Goal: Ability to maintain and perform role responsibilities to the fullest extent possible will improve 01/08/2020 0011 by Normand Sloop, RN Outcome: Progressing 01/08/2020 0011 by Normand Sloop, RN Outcome: Progressing   Problem: Pain Management: Goal:  Pain level will decrease 01/08/2020 0011 by Normand Sloop, RN Outcome: Progressing 01/08/2020 0011 by Normand Sloop, RN Outcome: Progressing

## 2020-01-08 NOTE — Progress Notes (Signed)
[facility] for Infectious Disease    Date of Admission:  01/04/2020   Total days of antibiotics 1/piptazo          ID: Olivia Hooper is a 79 y.o. female with   Principal Problem:   Closed left hip fracture, initial encounter ([place]) Active Problems:   COPD GOLD II / still smoking    Rheumatoid arthritis ([place])   Pulmonary mycobacterial infection ([place])   Anemia    HPI: 79yo F who is on chronic steroids for RA, also has COPD, and pulmonary m/abscessus previously treated with cefoxitin, linezolid and clofazamine. Now admitted with mechanical fall and sustained left femur fracture s/o IM femure fixation on 38/25, complicated by needing biPAP briefly. She does have supplemental O2 needs at baseline of 1.5-2L [place]. Over the last 24hr, increase in leukocytosis is noted, but patient denies fever or worsening cough. CXR shows some her of baseline lesions with known NTM infection. Medications:  . ascorbic acid  500 mg Oral BID  . Chlorhexidine Gluconate Cloth  6 each Topical Daily  . docusate sodium  100 mg Oral BID  . enoxaparin (LOVENOX) injection  30 mg Subcutaneous Q24H  . feeding supplement  237 mL Oral TID BM  . ferrous sulfate  325 mg Oral BID WC  . ipratropium-albuterol  3 mL Nebulization BID  . mouth rinse  15 mL Mouth Rinse BID  . mometasone-formoterol  2 puff Inhalation BID  . multivitamin with minerals  1 tablet Oral Daily  . senna  1 tablet Oral BID  . sodium chloride flush  10-40 mL Intracatheter Q12H  . sodium chloride flush  10-40 mL Intracatheter Q12H    Objective: Vital signs in last 24 hours: Temp:  [97.7 F (36.5 C)-100.1 F (37.8 C)] 97.7 F (36.5 C) (11/01 1322) Pulse Rate:  [81-112] 92 (11/01 1322) Resp:  [0-23] 15 (11/01 1322) BP: (87-116)/(31-56) 111/56 (11/01 1322) SpO2:  [89 %-99 %] 99 % (11/01 1322) Physical Exam  Constitutional:  oriented to person, place, and time. appears frail and under-nourished. No distress.  HENT: [place]/AT, PERRLA, no scleral  icterus Mouth/Throat: Oropharynx is clear and moist. No oropharyngeal exudate.  Cardiovascular: Normal rate, regular rhythm and normal heart sounds. Exam reveals no gallop and no friction rub.  No murmur heard.  Chest wall = port a cath in place Pulmonary/Chest: Effort normal and breath sounds normal. No respiratory distress.  has no wheezes.  Abdominal: Soft. Bowel sounds are normal.  exhibits no distension. There is no tenderness.  Ext: RA appearing changes Skin: Skin is warm and dry. No rash noted. No erythema.  Psychiatric: a normal mood and affect.  behavior is normal.    Lab Results Recent Labs    01/06/20 0251 01/06/20 0251 01/07/20 0333 01/08/20 0537  WBC 12.7*   < > 11.1* 15.7*  HGB 10.8*   < > 9.4* 8.0*  HCT 32.5*   < > 30.0* 25.1*  NA 138  --  137  --   K 4.5  --  4.0  --   CL 98  --  97*  --   CO2 32  --  33*  --   BUN 19  --  20  --   CREATININE 0.76  --  0.62  --    < > = values in this interval not displayed.   Liver Panel Recent Labs    01/05/20 2128  PROT 5.2*  ALBUMIN 2.0*  AST 13*  ALT 13  ALKPHOS  31  BILITOT 0.6    Microbiology: reviewed Studies/Results: No results found.   Assessment/Plan: Recommending to stop piptazo. Watch off of antibiotics  Pulmonary m.abscessus = having a pause on treatment due to side effect profile of medication/intolerances. Being observed off of treatment.   [facility] for Infectious Diseases Cell: [phone] Pager: [phone]  01/08/2020, 5:25 PM

## 2020-01-08 NOTE — Progress Notes (Signed)
Physical Therapy Treatment Patient Details Name: Olivia Hooper MRN: [id number] DOB: [birth year] Today's Date: 01/08/2020    History of Present Illness This is an 79 year old female with past medical history of chronic hypoxic respiratory failure on 2 L at baseline, COPD, rheumatoid arthritis, left breast cancer status post mastectomy, osteoporosis, hypertension, hyperlipidemia, Mycobacterium abscessus lung infection s/p treatment who presented to the ED with chief complaint of fall.Left hip x-ray positive for left femur subtrochanteric fracture with extension to greater and lesser trochanters. S/PIntramedullary fixation, Left femur.01/05/20: Some hypoxia and hypercarbia post surgery,requiring BiPAP  , likely secondary to atelectasis seen on chest x-ray.    PT Comments    Patient eager to mobilize. Patient requires mod assistance for bed mobility. Stood briefly at bed edge in "bear hug" technique. Plan OOB to recliner next vist, will attempt Bil Platform RW.    Follow Up Recommendations  SNF;Supervision/Assistance - 24 hour     Equipment Recommendations       Recommendations for Other Services       Precautions / Restrictions Precautions Precautions: Fall Restrictions Weight Bearing Restrictions: Yes LLE Weight Bearing: Weight bearing as tolerated Other Position/Activity Restrictions: severe hand RA    Mobility  Bed Mobility   Bed Mobility: Supine to Sit;Sit to Supine Rolling: Mod assist   Supine to sit: Mod assist     General bed mobility comments: assist with legs and trunk to sit up on bed edge and to return to supine.patient assisting right leg and able to scoot forward. Able to place right leg onto bed.  Transfers Overall transfer level: Needs assistance   Transfers: Sit to/from Stand Sit to Stand: Mod assist         General transfer comment: bear hug stand for linens to be changed, able to bear PWB on LLE  Ambulation/Gait                 Stairs              Wheelchair Mobility    Modified Rankin (Stroke Patients Only)       Balance Overall balance assessment: History of Falls;Needs assistance Sitting-balance support: Feet supported;Bilateral upper extremity supported Sitting balance-Leahy Scale: Fair Sitting balance - Comments: holding onto therapist, sat on bed edge x 5 minutes   Standing balance support: Bilateral upper extremity supported;During functional activity Standing balance-Leahy Scale: Poor                              Cognition Arousal/Alertness: Awake/alert                                            Exercises      General Comments        Pertinent Vitals/Pain Faces Pain Scale: Hurts little more Pain Location: left hip to knee Pain Descriptors / Indicators: Grimacing;Guarding;Moaning;Discomfort Pain Intervention(s): Monitored during session;Limited activity within patient's tolerance    Home Living                      Prior Function            PT Goals (current goals can now be found in the care plan section) Progress towards PT goals: Progressing toward goals    Frequency    Min 3X/week      PT Plan Current plan  remains appropriate    Co-evaluation              AM-PAC PT "6 Clicks" Mobility   Outcome Measure  Help needed turning from your back to your side while in a flat bed without using bedrails?: A Lot Help needed moving from lying on your back to sitting on the side of a flat bed without using bedrails?: A Lot Help needed moving to and from a bed to a chair (including a wheelchair)?: Total Help needed standing up from a chair using your arms (e.g., wheelchair or bedside chair)?: Total Help needed to walk in hospital room?: Total Help needed climbing 3-5 steps with a railing? : Total 6 Click Score: 8    End of Session Equipment Utilized During Treatment: Oxygen Activity Tolerance: Patient tolerated treatment well Patient  left: in bed;with call bell/phone within reach;with nursing/sitter in room Nurse Communication: Mobility status PT Visit Diagnosis: Unsteadiness on feet (R26.81);Difficulty in walking, not elsewhere classified (R26.2);Pain Pain - Right/Left: Left Pain - part of body: Hip     Time: 1504-1364 PT Time Calculation (min) (ACUTE ONLY): 24 min  Charges:  $Therapeutic Activity: 23-37 mins                     Tresa Endo PT Acute Rehabilitation Services Pager [phone] Office [phone]    Olivia Hooper 01/08/2020, 5:01 PM

## 2020-01-08 NOTE — Care Management Important Message (Signed)
Important Message  Patient Details IM Letter given to the Patient Name: Olivia Hooper MRN: [id number] Date of Birth: January 25, 1941   Medicare Important Message Given:  Yes     Kerin Salen 01/08/2020, 9:48 AM

## 2020-01-08 NOTE — Progress Notes (Signed)
Initial Nutrition Assessment  DOCUMENTATION CODES:   Severe malnutrition in context of chronic illness, Underweight  INTERVENTION:   -Ensure Surgery TID, each provides 330 kcals and 18g protein -D/c Boost Breeze per pt request -Multivitamin with minerals daily  NUTRITION DIAGNOSIS:   Severe Malnutrition related to chronic illness as evidenced by percent weight loss, severe fat depletion, severe muscle depletion.  GOAL:   Patient will meet greater than or equal to 90% of their needs  Progressing.  MONITOR:   PO intake, Supplement acceptance, Weight trends, I & O's, Labs  REASON FOR ASSESSMENT:   Consult Assessment of nutrition requirement/status  ASSESSMENT:   79 year-old female with medical history of RA, HLD, vertigo, COPD, osteoporosis, vitamin D deficiency, hypercalcemia, breast cancer, and anemia. She presented to the ED after a fall in which she fell down several steps. She had associated L hip pain after the fall.  10/29: s/p  INTRAMEDULLARY (IM) NAIL LEFT  FEMORAL (Left )  Pt in room, feels groggy, no family at bedside. Pt reports she was eating normally PTA. States she still has her appetite and ate pancakes, sausage, milk and coffee this morning for breakfast (~75% of meal). Pt states she drinks Ensure supplements at home, prefers these over the Boost Breeze supplements, will d/c. Will increase Ensure to TID given severe malnutrition.  Given significant weight loss (23% wt loss x 7.5 months) and severe fat and muscle depletions, pt meets criteria for severe malnutrition.  Labs reviewed. Medications: Vitamin C, Colace, Ferrous sulfate, Multivitamin with minerals daily, Senokot  NUTRITION - FOCUSED PHYSICAL EXAM:    Most Recent Value  Orbital Region Moderate depletion  Upper Arm Region Severe depletion  Thoracic and Lumbar Region Unable to assess  Buccal Region Severe depletion  Temple Region Severe depletion  Clavicle Bone Region Severe depletion  Clavicle  and Acromion Bone Region Severe depletion  Scapular Bone Region Severe depletion  Dorsal Hand Severe depletion  Patellar Region Unable to assess  Anterior Thigh Region Unable to assess  Posterior Calf Region Unable to assess  Edema (RD Assessment) None  Hair Reviewed  Eyes Reviewed  Mouth Reviewed  Skin Reviewed       Diet Order:   Diet Order            Diet regular Room service appropriate? Yes; Fluid consistency: Thin  Diet effective now                 EDUCATION NEEDS:   No education needs have been identified at this time  Skin:  Skin Assessment: Skin Integrity Issues: Skin Integrity Issues:: Stage I, Incisions Stage I: R buttock Incisions: L hip (10/29)  Last BM:  10/28 (?)  Height:   Ht Readings from Last 1 Encounters:  01/04/20 5\' 1"  (1.549 m)    Weight:   Wt Readings from Last 1 Encounters:  01/04/20 36 kg   BMI:  Body mass index is 15 kg/m.  Estimated Nutritional Needs:   Kcal:  1500-1700 kcal  Protein:  65-75 grams  Fluid:  >/= 1.6 L/day   Clayton Bibles, MS, RD, LDN Inpatient Clinical Dietitian Contact information available via Amion

## 2020-01-09 DIAGNOSIS — S72002A Fracture of unspecified part of neck of left femur, initial encounter for closed fracture: Secondary | ICD-10-CM | POA: Diagnosis not present

## 2020-01-09 LAB — BLOOD CULTURE ID PANEL (REFLEXED) - BCID2
A.calcoaceticus-baumannii: NOT DETECTED
Bacteroides fragilis: NOT DETECTED
CTX-M ESBL: NOT DETECTED
Candida albicans: NOT DETECTED
Candida auris: NOT DETECTED
Candida glabrata: NOT DETECTED
Candida krusei: NOT DETECTED
Candida parapsilosis: NOT DETECTED
Candida tropicalis: NOT DETECTED
Carbapenem resist OXA 48 LIKE: NOT DETECTED
Carbapenem resistance IMP: NOT DETECTED
Carbapenem resistance KPC: NOT DETECTED
Carbapenem resistance NDM: NOT DETECTED
Carbapenem resistance VIM: NOT DETECTED
Cryptococcus neoformans/gattii: NOT DETECTED
Enterobacter cloacae complex: DETECTED — AB
Enterococcus Faecium: NOT DETECTED
Enterococcus faecalis: NOT DETECTED
Escherichia coli: NOT DETECTED
Haemophilus influenzae: NOT DETECTED
Klebsiella aerogenes: NOT DETECTED
Klebsiella oxytoca: NOT DETECTED
Klebsiella pneumoniae: NOT DETECTED
Listeria monocytogenes: NOT DETECTED
Neisseria meningitidis: NOT DETECTED
Proteus species: NOT DETECTED
Pseudomonas aeruginosa: NOT DETECTED
Salmonella species: NOT DETECTED
Serratia marcescens: NOT DETECTED
Staphylococcus aureus (BCID): NOT DETECTED
Staphylococcus epidermidis: NOT DETECTED
Staphylococcus lugdunensis: NOT DETECTED
Staphylococcus species: NOT DETECTED
Stenotrophomonas maltophilia: NOT DETECTED
Streptococcus agalactiae: NOT DETECTED
Streptococcus pneumoniae: NOT DETECTED
Streptococcus pyogenes: NOT DETECTED
Streptococcus species: NOT DETECTED

## 2020-01-09 LAB — CBC WITH DIFFERENTIAL/PLATELET
Abs Immature Granulocytes: 0.05 10*3/uL (ref 0.00–0.07)
Basophils Absolute: 0 10*3/uL (ref 0.0–0.1)
Basophils Relative: 0 %
Eosinophils Absolute: 0.2 10*3/uL (ref 0.0–0.5)
Eosinophils Relative: 2 %
HCT: 26.4 % — ABNORMAL LOW (ref 36.0–46.0)
Hemoglobin: 8.5 g/dL — ABNORMAL LOW (ref 12.0–15.0)
Immature Granulocytes: 0 %
Lymphocytes Relative: 8 %
Lymphs Abs: 1.1 10*3/uL (ref 0.7–4.0)
MCH: 30.4 pg (ref 26.0–34.0)
MCHC: 32.2 g/dL (ref 30.0–36.0)
MCV: 94.3 fL (ref 80.0–100.0)
Monocytes Absolute: 1.6 10*3/uL — ABNORMAL HIGH (ref 0.1–1.0)
Monocytes Relative: 12 %
Neutro Abs: 10.3 10*3/uL — ABNORMAL HIGH (ref 1.7–7.7)
Neutrophils Relative %: 78 %
Platelets: 150 10*3/uL (ref 150–400)
RBC: 2.8 MIL/uL — ABNORMAL LOW (ref 3.87–5.11)
RDW: 14.6 % (ref 11.5–15.5)
WBC: 13.2 10*3/uL — ABNORMAL HIGH (ref 4.0–10.5)
nRBC: 0 % (ref 0.0–0.2)

## 2020-01-09 LAB — HEMOGLOBIN A1C
Hgb A1c MFr Bld: 5 % (ref 4.8–5.6)
Mean Plasma Glucose: 96.8 mg/dL

## 2020-01-09 LAB — OCCULT BLOOD X 1 CARD TO LAB, STOOL: Fecal Occult Bld: NEGATIVE

## 2020-01-09 LAB — PROCALCITONIN: Procalcitonin: 16.88 ng/mL

## 2020-01-09 MED ORDER — SODIUM CHLORIDE 0.9 % IV SOLN
2.0000 g | Freq: Once | INTRAVENOUS | Status: AC
  Administered 2020-01-09: 2 g via INTRAVENOUS
  Filled 2020-01-09: qty 2

## 2020-01-09 MED ORDER — SODIUM CHLORIDE 0.9 % IV SOLN
2.0000 g | Freq: Two times a day (BID) | INTRAVENOUS | Status: DC
  Administered 2020-01-09 – 2020-01-12 (×6): 2 g via INTRAVENOUS
  Filled 2020-01-09 (×7): qty 2

## 2020-01-09 NOTE — Progress Notes (Addendum)
PROGRESS NOTE  Olivia Hooper SNK:[id number] DOB: [birth year] DOA: 01/04/2020 PCP: [name], MD  HPI/Recap of past 24 hours: This is an 79 year old female with past medical history of chronic hypoxic respiratory failure on 2 L at baseline, COPD, rheumatoid arthritis, left breast cancer status post mastectomy, osteoporosis, hypertension, hyperlipidemia, Mycobacterium abscessus lung infection s/p treatment who presented to the ED with chief complaint of fall.  She went to walk outside and fell down approximately 4 steps onto her left hip onto the concrete.  Believes that she tripped over some carpeting which made her fall.  No loss of consciousness, did not hit her head, not on blood thinners.  No prodromal symptoms.  No other complaints at this time.  Currently has some left hip discomfort but otherwise no issues  ED Course: Afebrile and hemodynamically stable on baseline 2 LPM.  Labs notable for leukocytosis Hb 8.3 (previously 9.7 in April).  Left hip x-ray positive for left femur subtrochanteric fracture with extension to greater and lesser trochanters.  Orthopedic surgery was consulted by the ED.  Post Left femur intramedullary fixation on 01/05/2020.  Postsurgical hypoxia and hypercarbia with pH of 7.2 and PCO2 of 73.  Placed on BiPAP with improvement of her acidosis and hypercarbia.  01/09/20:  Per her daughter at bedside she is confused today.  Blood cultures drawn on 01/08/2020 + for gram-negative rods in both aerobic and anaerobic bottles 1 out of 2. Started on cefepime on 01/09/2020. Infectious disease following.   Assessment/Plan: Principal Problem:   Closed left hip fracture, initial encounter [facility]) Active Problems:   COPD GOLD II / still smoking    Rheumatoid arthritis ([place])   Pulmonary mycobacterial infection (HCC)   Anemia   Protein-calorie malnutrition, severe  Left hip fracture status post intramedullary fixation of left femur on 01/05/2020 by Dr. Lyla Glassing Pain control in  place- Allergic to morphine, makes her hallucinate. H&H uptrending No overt bleeding PT OT assessment with orthopedic surgeries guidance Fall precautions TOC consult for SNF placement  Sepsis secondary to CAP, suspect POA, gram-negative rods bacteremia, POA History of Mycobacterium abscesses, follows with Dr. Wendie Agreste at [facility] Meets SIRS criteria: Heart rate 110, RR 22, WBC 15.7K Received 1 dose of Zosyn on 01/08/2020 for possible Mycobacterium abscesses exacerbation which was discontinued per ID recommendation On 01/09/2020 blood cultures drawn on 01/08/2020 + for gram-negative rods 1 out of 2 bottles, seen in aerobic and anaerobic bottles. Continue to follow blood cultures for ID and sensitivities. Started Cefepime on 01/09/20 Her procalcitonin is downtrending from 23 to 16 Leukocytosis is also downtrending from 15K to 13K Currently afebrile Continue to monitor ID is following.  Acute metabolic encephalopathy likely 2/2 to acute illness Confused today per her daughter at bedside Treat underlying conditions Reorient as needed Fall precautions Avoid strong opiates Adding one-to-one sitter for patient's own safety  Acute on chronic hypoxic hypercarbic respiratory failure initially requiring BIPAP, improving, likely 2/2 to atelectasis with underlying COPD and Mycobacterium abscesses On 2 L oxygen continuously by nasal cannula at baseline Became acutely hypoxic and hypercarbic after surgery in PACU with ABG revealing pH 7.2 and PCO2 of 73; on BiPAP for 48 hours. Repeated ABG showed worsening PCO2 65 from 64, pH normal and compensated. Pulmonary was consulted to assess whether or not the patient will need NIV at discharge. Per pulmonary patient does not need BiPAP as she is well compensated, recommended outpatient work-up, PFT outpatient. Patient will need to follow-up with pulmonary outpatient.  Iron deficiency anemia of  unclear etiology Iron studies consistent with severe iron  deficiency Baseline hemoglobin appears to be 9.7 with MCV of 89 Will obtain reticulocyte count to see if this is a bone marrow issue Obtain FOBT, pending. Continue ferrous sulfate 325 mg twice daily Transfused 2 unit PRBC perioperatively Continue to monitor H&H  Acute blood loss anemia post orthopedic surgery Management as stated above Hemoglobin is uptrending 8.5 from 8.0. No overt bleeding Continue to monitor H&H.  History of Mycobacterium abscessus  Follows with ID, Dr. Wendie Agreste  COPD C/w home bronchodilators Continue continuous oxygen supplementation  Severe protein calorie malnutrition BMI 15 Albumin 2.3 Oral supplement Dietitian consult  Ambulatory dysfunction post orthopedic surgery PT assessment recommended SNF with 24-hour supervision/assistance Continue PT OT with assistance and fall precautions TOC assisting with SNF placement.    Code Status: Full code  Family Communication: Updated her daughter at bedside on 01/09/20.  Disposition Plan: SNF for rehab.   Consultants:  Orthopedics  ID  Pulmonary  Procedures:  Left hip repair on 01/05/2020 by Dr. Lyla Glassing.  Antimicrobials: Received 1 dose of Zosyn on 01/08/20, dc'd at ID requests prior to positive blood cx.  Cefepime 01/09/20>>>  DVT prophylaxis: Subcu Lovenox daily  Status is: Inpatient   Dispo:  Patient From: Home  Planned Disposition: [place]  Expected discharge date: 01/11/20  Medically stable for discharge: No, ongoing management of gram negative rods bacteremia.         Objective: Vitals:   01/08/20 2010 01/08/20 2031 01/08/20 2248 01/09/20 0415  BP: (!) 114/48   121/69  Pulse: 88   90  Resp: 18  18 18   Temp: 98.2 F (36.8 C)   98.7 F (37.1 C)  TempSrc: Oral   Oral  SpO2: 98% 97%  97%  Weight:      Height:        Intake/Output Summary (Last 24 hours) at 01/09/2020 0730 Last data filed at 01/09/2020 0100 Gross per 24 hour  Intake 1147.95 ml   Output 500 ml  Net 647.95 ml   Filed Weights   01/04/20 1227  Weight: 36 kg    Exam:  . General: 79 y.o. year-old female frail-appearing no acute distress.  Alert and interactive.   . Cardiovascular: Regular rate and rhythm no rubs or gallops.  Marland Kitchen Respiratory: Diffuse rales bilaterally no wheezing noted. .  Abdomen: Soft nontender normal bowel sounds present.  . Musculoskeletal: No lower extremity edema bilaterally.  Marland Kitchen Psychiatry: Mood is appropriate for condition and setting.  Data Reviewed: CBC: Recent Labs  Lab 01/05/20 2128 01/06/20 0251 01/07/20 0333 01/08/20 0537 01/09/20 0436  WBC 13.8* 12.7* 11.1* 15.7* 13.2*  NEUTROABS 11.3*  --   --   --  10.3*  HGB 11.4* 10.8* 9.4* 8.0* 8.5*  HCT 34.4* 32.5* 30.0* 25.1* 26.4*  MCV 91.5 92.1 94.9 94.7 94.3  PLT 211 206 167 148* 570   Basic Metabolic Panel: Recent Labs  Lab 01/04/20 1321 01/05/20 2128 01/06/20 0251 01/07/20 0333  NA 137 136 138 137  K 4.3 4.7 4.5 4.0  CL 97* 97* 98 97*  CO2 32 29 32 33*  GLUCOSE 85 339* 154* 148*  BUN 21 18 19 20   CREATININE 0.80 0.75 0.76 0.62  CALCIUM 9.3 9.0 9.4 9.3  MG  --  1.7  --   --   PHOS  --  3.5  --   --    GFR: Estimated Creatinine Clearance: 31.9 mL/min (by C-G formula based on SCr of  0.62 mg/dL). Liver Function Tests: Recent Labs  Lab 01/04/20 1321 01/05/20 2128  AST 14* 13*  ALT 13 13  ALKPHOS 85 66  BILITOT 0.7 0.6  PROT 5.7* 5.2*  ALBUMIN 2.3* 2.0*   No results for input(s): LIPASE, AMYLASE in the last 168 hours. No results for input(s): AMMONIA in the last 168 hours. Coagulation Profile: No results for input(s): INR, PROTIME in the last 168 hours. Cardiac Enzymes: No results for input(s): CKTOTAL, CKMB, CKMBINDEX, TROPONINI in the last 168 hours. BNP (last 3 results) No results for input(s): PROBNP in the last 8760 hours. HbA1C: No results for input(s): HGBA1C in the last 72 hours. CBG: No results for input(s): GLUCAP in the last 168 hours. Lipid  Profile: No results for input(s): CHOL, HDL, LDLCALC, TRIG, CHOLHDL, LDLDIRECT in the last 72 hours. Thyroid Function Tests: No results for input(s): TSH, T4TOTAL, FREET4, T3FREE, THYROIDAB in the last 72 hours. Anemia Panel: Recent Labs    01/08/20 0537  RETICCTPCT 1.9   Urine analysis:    Component Value Date/Time   COLORURINE YELLOW 05/18/2019 1712   APPEARANCEUR CLEAR 05/18/2019 1712   LABSPEC 1.017 05/18/2019 1712   PHURINE 5.0 05/18/2019 1712   GLUCOSEU NEGATIVE 05/18/2019 1712   HGBUR NEGATIVE 05/18/2019 1712   BILIRUBINUR NEGATIVE 05/18/2019 1712   KETONESUR NEGATIVE 05/18/2019 1712   PROTEINUR 30 (A) 05/18/2019 1712   NITRITE NEGATIVE 05/18/2019 1712   LEUKOCYTESUR NEGATIVE 05/18/2019 1712   Sepsis Labs: @LABRCNTIP (procalcitonin:4,lacticidven:4)  ) Recent Results (from the past 240 hour(s))  Respiratory Panel by RT PCR (Flu A&B, Covid) - Nasopharyngeal Swab     Status: None   Collection Time: 01/04/20  1:46 PM   Specimen: Nasopharyngeal Swab  Result Value Ref Range Status   SARS Coronavirus 2 by RT PCR NEGATIVE NEGATIVE Final    Comment: (NOTE) SARS-CoV-2 target nucleic acids are NOT DETECTED.  The SARS-CoV-2 RNA is generally detectable in upper respiratoy specimens during the acute phase of infection. The lowest concentration of SARS-CoV-2 viral copies this assay can detect is 131 copies/mL. A negative result does not preclude SARS-Cov-2 infection and should not be used as the sole basis for treatment or other patient management decisions. A negative result may occur with  improper specimen collection/handling, submission of specimen other than nasopharyngeal swab, presence of viral mutation(s) within the areas targeted by this assay, and inadequate number of viral copies (<131 copies/mL). A negative result must be combined with clinical observations, patient history, and epidemiological information. The expected result is Negative.  Fact Sheet for  Patients:  PinkCheek.be  Fact Sheet for Healthcare Providers:  GravelBags.it  This test is no t yet approved or cleared by the [place] FDA and  has been authorized for detection and/or diagnosis of SARS-CoV-2 by FDA under an Emergency Use Authorization (EUA). This EUA will remain  in effect (meaning this test can be used) for the duration of the COVID-19 declaration under Section 564(b)(1) of the Act, 21 U.S.C. section 360bbb-3(b)(1), unless the authorization is terminated or revoked sooner.     Influenza A by PCR NEGATIVE NEGATIVE Final   Influenza B by PCR NEGATIVE NEGATIVE Final    Comment: (NOTE) The Xpert Xpress SARS-CoV-2/FLU/RSV assay is intended as an aid in  the diagnosis of influenza from Nasopharyngeal swab specimens and  should not be used as a sole basis for treatment. Nasal washings and  aspirates are unacceptable for Xpert Xpress SARS-CoV-2/FLU/RSV  testing.  Fact Sheet for Patients: PinkCheek.be  Fact Sheet for  Healthcare Providers: GravelBags.it  This test is not yet approved or cleared by the [place] and  has been authorized for detection and/or diagnosis of SARS-CoV-2 by  FDA under an Emergency Use Authorization (EUA). This EUA will remain  in effect (meaning this test can be used) for the duration of the  Covid-19 declaration under Section 564(b)(1) of the Act, 21  U.S.C. section 360bbb-3(b)(1), unless the authorization is  terminated or revoked. Performed at [facility], [place] [address] [place], [place] [address]   Surgical PCR screen     Status: None   Collection Time: 01/04/20  8:49 PM   Specimen: Nasal Mucosa; Nasal Swab  Result Value Ref Range Status   MRSA, PCR NEGATIVE NEGATIVE Final   Staphylococcus aureus NEGATIVE NEGATIVE Final    Comment: (NOTE) The Xpert SA Assay (FDA approved for NASAL  specimens in patients 74 years of age and older), is one component of a comprehensive surveillance program. It is not intended to diagnose infection nor to guide or monitor treatment. Performed at [facility], [place] [address], [place], [place] [address]   Culture, blood (routine x 2)     Status: None (Preliminary result)   Collection Time: 01/08/20 11:16 AM   Specimen: BLOOD  Result Value Ref Range Status   Specimen Description   Final    BLOOD RIGHT ANTECUBITAL Performed at [facility] [address], [place], [place] [address]    Special Requests   Final    BOTTLES DRAWN AEROBIC AND ANAEROBIC Blood Culture results may not be optimal due to an inadequate volume of blood received in culture bottles Performed at [facility] [address] [place], [place] [address]    Culture  Setup Time   Final    GRAM NEGATIVE RODS ANAEROBIC BOTTLE ONLY CRITICAL VALUE NOTED.  VALUE IS CONSISTENT WITH PREVIOUSLY REPORTED AND CALLED VALUE. Performed at [facility], [place] [address] [place], [place] [address]    Culture GRAM NEGATIVE RODS  Final   Report Status PENDING  Incomplete  Culture, blood (routine x 2)     Status: None (Preliminary result)   Collection Time: 01/08/20 11:16 AM   Specimen: BLOOD RIGHT HAND  Result Value Ref Range Status   Specimen Description   Final    BLOOD RIGHT HAND Performed at [facility] [address] [place], [place] [address]    Special Requests   Final    BOTTLES DRAWN AEROBIC AND ANAEROBIC Blood Culture adequate volume Performed at [facility] [address] [place], [place] [address]    Culture  Setup Time   Final    GRAM NEGATIVE RODS IN BOTH AEROBIC AND ANAEROBIC BOTTLES CRITICAL RESULT CALLED TO, READ BACK BY AND VERIFIED WITH: PHARMD D LOFFORD [id number] AT 725 AM BY CM Performed at [facility], [place] [address], [place], [place] [address]     Culture GRAM NEGATIVE RODS  Final   Report Status PENDING  Incomplete  Blood Culture ID Panel (Reflexed)     Status: Abnormal (Preliminary result)   Collection Time: 01/08/20 11:16 AM  Result Value Ref Range Status   Enterococcus faecalis NOT DETECTED NOT DETECTED Final   Enterococcus Faecium NOT DETECTED NOT DETECTED Final   Listeria monocytogenes NOT DETECTED NOT DETECTED Final   Staphylococcus species NOT DETECTED NOT DETECTED Final   Staphylococcus aureus (BCID) NOT DETECTED NOT DETECTED Final   Staphylococcus epidermidis NOT  DETECTED NOT DETECTED Final   Staphylococcus lugdunensis NOT DETECTED NOT DETECTED Final   Streptococcus species NOT DETECTED NOT DETECTED Final   Streptococcus agalactiae NOT DETECTED NOT DETECTED Final   Streptococcus pneumoniae NOT DETECTED NOT DETECTED Final   Streptococcus pyogenes NOT DETECTED NOT DETECTED Final   A.calcoaceticus-baumannii NOT DETECTED NOT DETECTED Final   Bacteroides fragilis NOT DETECTED NOT DETECTED Final   Enterobacterales PENDING NOT DETECTED Incomplete   Enterobacter cloacae complex DETECTED (A) NOT DETECTED Final    Comment: CRITICAL RESULT CALLED TO, READ BACK BY AND VERIFIED WITH: PHARMD D LOFFORD [id number] AT 7025 AM BY CM    Escherichia coli NOT DETECTED NOT DETECTED Final   Klebsiella aerogenes NOT DETECTED NOT DETECTED Final   Klebsiella oxytoca NOT DETECTED NOT DETECTED Final   Klebsiella pneumoniae NOT DETECTED NOT DETECTED Final   Proteus species NOT DETECTED NOT DETECTED Final   Salmonella species NOT DETECTED NOT DETECTED Final   Serratia marcescens NOT DETECTED NOT DETECTED Final   Haemophilus influenzae NOT DETECTED NOT DETECTED Final   Neisseria meningitidis NOT DETECTED NOT DETECTED Final   Pseudomonas aeruginosa NOT DETECTED NOT DETECTED Final   Stenotrophomonas maltophilia NOT DETECTED NOT DETECTED Final   Candida albicans NOT DETECTED NOT DETECTED Final   Candida auris NOT DETECTED NOT DETECTED Final    Candida glabrata NOT DETECTED NOT DETECTED Final   Candida krusei NOT DETECTED NOT DETECTED Final   Candida parapsilosis NOT DETECTED NOT DETECTED Final   Candida tropicalis NOT DETECTED NOT DETECTED Final   Cryptococcus neoformans/gattii NOT DETECTED NOT DETECTED Final   CTX-M ESBL NOT DETECTED NOT DETECTED Final   Carbapenem resistance IMP NOT DETECTED NOT DETECTED Final   Carbapenem resistance KPC NOT DETECTED NOT DETECTED Final   Carbapenem resistance NDM NOT DETECTED NOT DETECTED Final   Carbapenem resist OXA 48 LIKE NOT DETECTED NOT DETECTED Final   Carbapenem resistance VIM NOT DETECTED NOT DETECTED Final    Comment: Performed at [facility], [address], [place], [place] [address]      Studies: DG CHEST PORT 1 VIEW  Result Date: 01/08/2020 CLINICAL DATA:  Acute respiratory failure with hypoxia. EXAM: PORTABLE CHEST 1 VIEW COMPARISON:  Radiograph 01/05/2020.  CT 09/14/2019 FINDINGS: Patient is rotated. Right chest port remains in place. Cavitary process at the right lung apex was better delineated on prior CT, grossly stable in radiographic appearance. Increasing volume loss in the left hemithorax as well as consolidation at the left lung base. There may be a left pleural effusion. Possible small right pleural effusion. Underlying emphysema and bronchial thickening. The heart is normal in size. Aortic atherosclerosis. No pneumothorax. Bones are under mineralized. IMPRESSION: 1. Increasing volume loss in the left hemithorax with consolidation at the left lung base. Findings may be related to airspace consolidation such as pneumonia or atelectasis related to mucous plugging. 2. Possible pleural effusions. 3. Cavitary process at the right lung apex was better delineated on prior CT, grossly stable in radiographic appearance. Electronically Signed   By: Keith Rake M.D.   On: 01/08/2020 19:28    Scheduled Meds: . ascorbic acid  500 mg Oral BID  . Chlorhexidine Gluconate Cloth   6 each Topical Daily  . docusate sodium  100 mg Oral BID  . enoxaparin (LOVENOX) injection  30 mg Subcutaneous Q24H  . feeding supplement  237 mL Oral TID BM  . ferrous sulfate  325 mg Oral BID WC  . mouth rinse  15 mL Mouth Rinse BID  .  mometasone-formoterol  2 puff Inhalation BID  . multivitamin with minerals  1 tablet Oral Daily  . senna  1 tablet Oral BID  . sodium chloride flush  10-40 mL Intracatheter Q12H  . sodium chloride flush  10-40 mL Intracatheter Q12H    Continuous Infusions: . ceFEPime (MAXIPIME) IV    . methocarbamol (ROBAXIN) IV       LOS: 5 days     Kayleen Memos, MD [facility] Pager [phone]  If 7PM-7AM, please contact night-coverage [email or web address] Password [email or web address] 01/09/2020, 7:30 AM

## 2020-01-09 NOTE — Progress Notes (Signed)
Physical Therapy Treatment Patient Details Name: Olivia Hooper MRN: [id number] DOB: Aug 03, 1940 Today's Date: 01/09/2020    History of Present Illness This is an 79 year old female with past medical history of chronic hypoxic respiratory failure on 2 L at baseline, COPD, rheumatoid arthritis, left breast cancer status post mastectomy, osteoporosis, hypertension, hyperlipidemia, Mycobacterium abscessus lung infection s/p treatment who presented to the ED with chief complaint of fall.Left hip x-ray positive for left femur subtrochanteric fracture with extension to greater and lesser trochanters. S/PIntramedullary fixation, Left femur.01/05/20: Some hypoxia and hypercarbia post surgery,requiring BiPAP  , likely secondary to atelectasis seen on chest x-ray.    PT Comments    The patient  Stood with max assistance of 2 at platform RW. Patient did take  Small steps to recliner, decreased WB taken on LLE, knee buckling. Continue PT for progressive mobility.   Follow Up Recommendations  SNF;Supervision/Assistance - 24 hour     Equipment Recommendations  None recommended by PT    Recommendations for Other Services       Precautions / Restrictions Precautions Precautions: Fall Restrictions LLE Weight Bearing: Weight bearing as tolerated Other Position/Activity Restrictions: severe hand RA    Mobility  Bed Mobility   Bed Mobility: Supine to Sit     Supine to sit: Mod assist     General bed mobility comments: assist with legs and trunk to sit up on bed edge , patient initiating moving legs and to sit up.  Transfers Overall transfer level: Needs assistance   Transfers: Sit to/from Stand Sit to Stand: Max assist;+2 safety/equipment         General transfer comment: used platform RW/EVA to stand from bed. Decreased WB tolerated on L leg, tending to buckle. Small shuffle steps to recliner.  Ambulation/Gait                 Stairs             Wheelchair Mobility     Modified Rankin (Stroke Patients Only)       Balance Overall balance assessment: History of Falls;Needs assistance Sitting-balance support: Feet supported;Bilateral upper extremity supported Sitting balance-Leahy Scale: Fair     Standing balance support: Bilateral upper extremity supported;During functional activity Standing balance-Leahy Scale: Poor                              Cognition Arousal/Alertness: Awake/alert Behavior During Therapy: WFL for tasks assessed/performed                                          Exercises      General Comments        Pertinent Vitals/Pain Faces Pain Scale: Hurts little more Pain Location: left hip to knee Pain Descriptors / Indicators: Grimacing;Guarding;Moaning;Discomfort Pain Intervention(s): Monitored during session;Limited activity within patient's tolerance    Home Living                      Prior Function            PT Goals (current goals can now be found in the care plan section) Progress towards PT goals: Progressing toward goals    Frequency    Min 2X/week      PT Plan Current plan remains appropriate;Frequency needs to be updated    Co-evaluation  AM-PAC PT "6 Clicks" Mobility   Outcome Measure  Help needed turning from your back to your side while in a flat bed without using bedrails?: A Lot Help needed moving from lying on your back to sitting on the side of a flat bed without using bedrails?: A Lot Help needed moving to and from a bed to a chair (including a wheelchair)?: Total Help needed standing up from a chair using your arms (e.g., wheelchair or bedside chair)?: Total Help needed to walk in hospital room?: Total Help needed climbing 3-5 steps with a railing? : Total 6 Click Score: 8    End of Session Equipment Utilized During Treatment: Oxygen;Gait belt Activity Tolerance: Patient tolerated treatment well Patient left: with call  bell/phone within reach;in chair;with chair alarm set Nurse Communication: Mobility status PT Visit Diagnosis: Unsteadiness on feet (R26.81);Difficulty in walking, not elsewhere classified (R26.2);Pain Pain - Right/Left: Left Pain - part of body: Hip     Time: 1006-1030 PT Time Calculation (min) (ACUTE ONLY): 24 min  Charges:  $Therapeutic Activity: 23-37 mins                     Tresa Endo PT Acute Rehabilitation Services Pager [phone] Office [phone]    Claretha Cooper 01/09/2020, 3:16 PM

## 2020-01-09 NOTE — Progress Notes (Signed)
PHARMACY - PHYSICIAN COMMUNICATION CRITICAL VALUE ALERT - BLOOD CULTURE IDENTIFICATION (BCID)  Olivia Hooper is an 79 y.o. female who presented to [facility] on 01/04/2020 with a chief complaint of  c/o left hip pain s/p fall the day before. She was found to have leftintertrochanteric femur fracture and underwent intramedullary fixation of leftfemur on 10/29. WBC and PCT trended up on 11/1. Zosyn was ordered, but then ID recommended to observe off abx.  On 11/2, three of four blood culture bottles had GNR with enterobacter cloacae noted on BCID.   Name of physician (or Provider) Contacted: Dr. Nevada Crane  Current antibiotics: none  Changes to prescribed antibiotics recommended:  Dr. Nevada Crane ordered cefepime this morning. Recom. to continue with cefepime for now.  Results for orders placed or performed during the hospital encounter of 01/04/20  Blood Culture ID Panel (Reflexed) (Collected: 01/08/2020 11:16 AM)  Result Value Ref Range   Enterococcus faecalis NOT DETECTED NOT DETECTED   Enterococcus Faecium NOT DETECTED NOT DETECTED   Listeria monocytogenes NOT DETECTED NOT DETECTED   Staphylococcus species NOT DETECTED NOT DETECTED   Staphylococcus aureus (BCID) NOT DETECTED NOT DETECTED   Staphylococcus epidermidis NOT DETECTED NOT DETECTED   Staphylococcus lugdunensis NOT DETECTED NOT DETECTED   Streptococcus species NOT DETECTED NOT DETECTED   Streptococcus agalactiae NOT DETECTED NOT DETECTED   Streptococcus pneumoniae NOT DETECTED NOT DETECTED   Streptococcus pyogenes NOT DETECTED NOT DETECTED   A.calcoaceticus-baumannii NOT DETECTED NOT DETECTED   Bacteroides fragilis NOT DETECTED NOT DETECTED   Enterobacterales PENDING NOT DETECTED   Enterobacter cloacae complex DETECTED (A) NOT DETECTED   Escherichia coli NOT DETECTED NOT DETECTED   Klebsiella aerogenes NOT DETECTED NOT DETECTED   Klebsiella oxytoca NOT DETECTED NOT DETECTED   Klebsiella pneumoniae NOT DETECTED NOT DETECTED   Proteus  species NOT DETECTED NOT DETECTED   Salmonella species NOT DETECTED NOT DETECTED   Serratia marcescens NOT DETECTED NOT DETECTED   Haemophilus influenzae NOT DETECTED NOT DETECTED   Neisseria meningitidis NOT DETECTED NOT DETECTED   Pseudomonas aeruginosa NOT DETECTED NOT DETECTED   Stenotrophomonas maltophilia NOT DETECTED NOT DETECTED   Candida albicans NOT DETECTED NOT DETECTED   Candida auris NOT DETECTED NOT DETECTED   Candida glabrata NOT DETECTED NOT DETECTED   Candida krusei NOT DETECTED NOT DETECTED   Candida parapsilosis NOT DETECTED NOT DETECTED   Candida tropicalis NOT DETECTED NOT DETECTED   Cryptococcus neoformans/gattii NOT DETECTED NOT DETECTED   CTX-M ESBL NOT DETECTED NOT DETECTED   Carbapenem resistance IMP NOT DETECTED NOT DETECTED   Carbapenem resistance KPC NOT DETECTED NOT DETECTED   Carbapenem resistance NDM NOT DETECTED NOT DETECTED   Carbapenem resist OXA 48 LIKE NOT DETECTED NOT DETECTED   Carbapenem resistance VIM NOT DETECTED NOT DETECTED    Lynelle Doctor 01/09/2020  8:34 AM

## 2020-01-09 NOTE — Progress Notes (Signed)
Nutrition Follow-up  DOCUMENTATION CODES:   Severe malnutrition in context of chronic illness, Underweight  INTERVENTION:   -Ensure Surgery TID, each provides 330 kcals and 18g protein -Multivitamin with minerals daily  NUTRITION DIAGNOSIS:   Severe Malnutrition related to chronic illness as evidenced by percent weight loss, severe fat depletion, severe muscle depletion.  Ongoing.  GOAL:   Patient will meet greater than or equal to 90% of their needs  Progressing.  MONITOR:   PO intake, Supplement acceptance, Weight trends, I & O's, Labs  REASON FOR ASSESSMENT:   Consult Assessment of nutrition requirement/status  ASSESSMENT:   79 year-old female with medical history of RA, HLD, vertigo, COPD, osteoporosis, vitamin D deficiency, hypercalcemia, breast cancer, and anemia. She presented to the ED after a fall in which she fell down several steps. She had associated L hip pain after the fall.  10/29: s/p INTRAMEDULLARY (IM) NAIL LEFT FEMORAL (Left   Last follow-up 11/1, was diagnosed with severe malnutrition.  Pt currently consuming 75% of meals. Drinking at least 1-2 Ensure Surgery daily. Will add Magic cups with meals.   Admission weight: 79 lbs. No new weights for this admission.  Labs reviewed. Medications: Vitamin C, Colace, Ferrous sulfate, Multivitamin with minerals daily, Senokot  Diet Order:   Diet Order            Diet regular Room service appropriate? Yes; Fluid consistency: Thin  Diet effective now                 EDUCATION NEEDS:   No education needs have been identified at this time  Skin:  Skin Assessment: Skin Integrity Issues: Skin Integrity Issues:: Stage I, Incisions Stage I: R buttock Incisions: L hip (10/29)  Last BM:  11/2 -type 4  Height:   Ht Readings from Last 1 Encounters:  01/04/20 5\' 1"  (1.549 m)    Weight:   Wt Readings from Last 1 Encounters:  01/04/20 36 kg   BMI:  Body mass index is 15 kg/m.  Estimated  Nutritional Needs:   Kcal:  1500-1700 kcal  Protein:  65-75 grams  Fluid:  >/= 1.6 L/day  Clayton Bibles, MS, RD, LDN Inpatient Clinical Dietitian Contact information available via Amion

## 2020-01-09 NOTE — Progress Notes (Signed)
[place] for Infectious Disease    Date of Admission:  01/04/2020   Total days of antibiotics 2/day 1 cefepime           ID: Olivia Hooper is a 79 y.o. 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 ascorbic acid  500 mg Oral BID  . Chlorhexidine Gluconate Cloth  6 each Topical Daily  . docusate sodium  100 mg Oral BID  . enoxaparin (LOVENOX) injection  30 mg Subcutaneous Q24H  . feeding supplement  237 mL Oral TID BM  . ferrous sulfate  325 mg Oral BID WC  . mouth rinse  15 mL Mouth Rinse BID  . mometasone-formoterol  2 puff Inhalation BID  . multivitamin with minerals  1 tablet Oral Daily  . senna  1 tablet Oral BID  . sodium chloride flush  10-40 mL Intracatheter Q12H  . sodium chloride flush  10-40 mL Intracatheter Q12H    Objective: Vital signs in last 24 hours: Temp:  [98.2 F (36.8 C)-98.7 F (37.1 C)] 98.7 F (37.1 C) (11/02 1423) Pulse Rate:  [88-94] 94 (11/02 1423) Resp:  [14-18] 14 (11/02 1423) BP: (114-126)/(48-69) 126/60 (11/02 1423) SpO2:  [97 %-100 %] 100 % (11/02 1423) Physical Exam  Constitutional:  oriented to person, place, and time. appears chronically,older than stated age. No distress.  HENT: [place]/AT, PERRLA, no scleral icterus Mouth/Throat: Oropharynx is clear and moist. No oropharyngeal exudate.  Cardiovascular: Normal rate, regular rhythm and normal heart sounds. Exam reveals no gallop and no friction rub.  No murmur heard.  Pulmonary/Chest: Effort normal and breath sounds normal. No respiratory distress.  has no wheezes.  Neck = supple, no nuchal rigidity Abdominal: Soft. Bowel sounds are normal.  exhibits no distension. There is no tenderness.  Ext= RA  changes of joints of the hands Skin: Skin is warm and dry. No rash noted. No erythema.  Psychiatric: a normal mood and affect.  behavior is normal.    Lab Results Recent Labs    01/07/20 0333 01/07/20 0333 01/08/20 0537 01/09/20 0436  WBC 11.1*   < > 15.7* 13.2*  HGB 9.4*   < > 8.0* 8.5*  HCT 30.0*   < > 25.1* 26.4*  NA 137  --   --   --   K 4.0  --   --   --   CL 97*  --   --   --   CO2 33*  --   --   --   BUN 20  --   --   --   CREATININE 0.62  --   --   --    < > = values in this interval not displayed.    Microbiology: 11/1 blood cx + enterobactercloacae Studies/Results: DG CHEST PORT 1 VIEW  Result Date: 01/08/2020 CLINICAL DATA:  Acute respiratory failure with hypoxia. EXAM: PORTABLE CHEST 1 VIEW COMPARISON:  Radiograph 01/05/2020.  CT 09/14/2019 FINDINGS: Patient is rotated. Right chest port remains in place. Cavitary process at the right lung apex was better delineated on prior CT, grossly stable in radiographic appearance. Increasing volume loss in the left hemithorax as well as consolidation at the left lung base. There may be  a left pleural effusion. Possible small right pleural effusion. Underlying emphysema and bronchial thickening. The heart is normal in size. Aortic atherosclerosis. No pneumothorax. Bones are under mineralized. IMPRESSION: 1. Increasing volume loss in the left hemithorax with consolidation at the left lung base. Findings may be related to airspace consolidation such as pneumonia or atelectasis related to mucous plugging. 2. Possible pleural effusions. 3. Cavitary process at the right lung apex was better delineated on prior CT, grossly stable in radiographic appearance. Electronically Signed   By: Keith Rake M.D.   On: 01/08/2020 19:28     Assessment/Plan: 79yoF with RA,bronchiectasis admitted for left hip fracture found to have fever on hd#4, and cxr c/w pneumonia. -- now with secondary enterobacter bacteremia  = continue with cefepime for  both pneumonia and bacteremia = recommend to get sputum cx  Bronchiectasis with m.abscessus ==not treating at this time.  [facility] for Infectious Diseases Cell: [phone] Pager: [phone]  01/09/2020, 4:13 PM

## 2020-01-09 NOTE — Progress Notes (Signed)
Pharmacy Antibiotic Note  Olivia Hooper is a 79 y.o. female with hx mycobacterium abscessus lung infection (f/u with Dr. Lucianne Lei dam at [facility]), presented to the ED on 01/04/20 with c/o left hip pain s/p fall the day before. She was found to have left intertrochanteric femur fracture and underwent intramedullary fixation of left femur on 10/29. WBC and PCT trended up on 11/1. Zosyn was ordered, but then ID recommended to observe off abx.  On 11/2, three of four blood culture bottles had GNR with enterobacter cloacae noted on BCID.  Pharmacy is consulted to dose cefepime for bacteremia.  Today, 01/09/2020: - Tmax 99, WBC down 13.2 - scr 0.62 on 10/31 (crcl~32) - PCT trending down 16.88   Plan: - cefepime 2gm IV q12h - f/u susceptibilities   ______________________________________  Height: 5\' 1"  (154.9 cm) Weight: 36 kg (79 lb 5.9 oz) IBW/kg (Calculated) : 47.8  Temp (24hrs), Avg:98.4 F (36.9 C), Min:97.7 F (36.5 C), Max:99 F (37.2 C)  Recent Labs  Lab 01/04/20 1321 01/04/20 1321 01/05/20 2128 01/06/20 0251 01/07/20 0333 01/08/20 0537 01/09/20 0436  WBC 12.4*   < > 13.8* 12.7* 11.1* 15.7* 13.2*  CREATININE 0.80  --  0.75 0.76 0.62  --   --    < > = values in this interval not displayed.    Estimated Creatinine Clearance: 31.9 mL/min (by C-G formula based on SCr of 0.62 mg/dL).    Allergies  Allergen Reactions  . Buprenorphine Hcl Nausea And Vomiting  . Morphine And Related Nausea And Vomiting        Antimicrobials this admission:  11/1 zosyn>> 11/1 11/2 cefepime>>  Microbiology results:  11/1 BCx x2: 3/4 bottles with GNR (BCID= enterobacter cloacae) 10/28 MRSA PCR: neg   Thank you for allowing pharmacy to be a part of this patient's care.  Lynelle Doctor 01/09/2020 7:50 AM

## 2020-01-09 NOTE — TOC Progression Note (Signed)
Transition of Care [facility]) - Progression Note    Patient Details  Name: Olivia Hooper MRN: [id number] Date of Birth: [birth year]  Transition of Care [facility]) CM/SW [place], [place] Phone Number: 01/09/2020, 10:22 AM  Clinical Narrative:   Spoke with patient about bed offers-she deferred to daughter, stating that she has had experience with rehab facilities herself. Spoke with Merita Norton (Daughter) [phone], who selects [facility] based on her own experience there.  Facility alerted. TOC will continue to follow during the course of hospitalization.     Expected Discharge Plan: [place] Barriers to Discharge: Barriers Resolved  Expected Discharge Plan and Services Expected Discharge Plan: [place]   Discharge Planning Services: CM Consult Post Acute Care Choice: [place] Living arrangements for the past 2 months: Single Family Home                                       Social Determinants of Health (SDOH) Interventions    Readmission Risk Interventions No flowsheet data found.

## 2020-01-10 DIAGNOSIS — S72002A Fracture of unspecified part of neck of left femur, initial encounter for closed fracture: Secondary | ICD-10-CM | POA: Diagnosis not present

## 2020-01-10 LAB — CBC WITH DIFFERENTIAL/PLATELET
Abs Immature Granulocytes: 0.05 10*3/uL (ref 0.00–0.07)
Basophils Absolute: 0 10*3/uL (ref 0.0–0.1)
Basophils Relative: 0 %
Eosinophils Absolute: 0.1 10*3/uL (ref 0.0–0.5)
Eosinophils Relative: 1 %
HCT: 28.2 % — ABNORMAL LOW (ref 36.0–46.0)
Hemoglobin: 9 g/dL — ABNORMAL LOW (ref 12.0–15.0)
Immature Granulocytes: 1 %
Lymphocytes Relative: 11 %
Lymphs Abs: 1.2 10*3/uL (ref 0.7–4.0)
MCH: 29.8 pg (ref 26.0–34.0)
MCHC: 31.9 g/dL (ref 30.0–36.0)
MCV: 93.4 fL (ref 80.0–100.0)
Monocytes Absolute: 1.3 10*3/uL — ABNORMAL HIGH (ref 0.1–1.0)
Monocytes Relative: 13 %
Neutro Abs: 7.6 10*3/uL (ref 1.7–7.7)
Neutrophils Relative %: 74 %
Platelets: 175 10*3/uL (ref 150–400)
RBC: 3.02 MIL/uL — ABNORMAL LOW (ref 3.87–5.11)
RDW: 14.6 % (ref 11.5–15.5)
WBC: 10.3 10*3/uL (ref 4.0–10.5)
nRBC: 0 % (ref 0.0–0.2)

## 2020-01-10 LAB — BASIC METABOLIC PANEL
Anion gap: 7 (ref 5–15)
BUN: 20 mg/dL (ref 8–23)
CO2: 36 mmol/L — ABNORMAL HIGH (ref 22–32)
Calcium: 10 mg/dL (ref 8.9–10.3)
Chloride: 97 mmol/L — ABNORMAL LOW (ref 98–111)
Creatinine, Ser: 0.46 mg/dL (ref 0.44–1.00)
GFR, Estimated: >60 mL/min (ref >60)
Glucose, Bld: 99 mg/dL (ref 70–99)
Potassium: 3.6 mmol/L (ref 3.5–5.1)
Sodium: 140 mmol/L (ref 135–145)

## 2020-01-10 LAB — PROCALCITONIN: Procalcitonin: 7.78 ng/mL

## 2020-01-10 MED ORDER — SODIUM CHLORIDE 0.9 % IV SOLN: INTRAVENOUS | Status: DC

## 2020-01-10 MED ORDER — QUETIAPINE FUMARATE 25 MG PO TABS
25.0000 mg | ORAL_TABLET | Freq: Every day | ORAL | Status: DC
  Administered 2020-01-10 – 2020-01-11 (×2): 25 mg via ORAL
  Filled 2020-01-10 (×2): qty 1

## 2020-01-10 MED ORDER — HEPARIN SODIUM (PORCINE) 5000 UNIT/ML IJ SOLN
5000.0000 [IU] | Freq: Three times a day (TID) | INTRAMUSCULAR | Status: DC
  Administered 2020-01-11 – 2020-01-12 (×5): 5000 [IU] via SUBCUTANEOUS
  Filled 2020-01-10 (×5): qty 1

## 2020-01-10 NOTE — Progress Notes (Signed)
[name] for Infectious Disease    Date of Admission:  01/04/2020   Total days of antibiotics 3           ID: Olivia Hooper is a 79 y.o. 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 Denies cough and fever, though easily goes to sleep during conversation  Medications:  . ascorbic acid  500 mg Oral BID  . Chlorhexidine Gluconate Cloth  6 each Topical Daily  . docusate sodium  100 mg Oral BID  . feeding supplement  237 mL Oral TID BM  . ferrous sulfate  325 mg Oral BID WC  . [START ON 01/11/2020] heparin  5,000 Units Subcutaneous Q8H  . mouth rinse  15 mL Mouth Rinse BID  . mometasone-formoterol  2 puff Inhalation BID  . multivitamin with minerals  1 tablet Oral Daily  . QUEtiapine  25 mg Oral QHS  . senna  1 tablet Oral BID  . sodium chloride flush  10-40 mL Intracatheter Q12H  . sodium chloride flush  10-40 mL Intracatheter Q12H    Objective: Vital signs in last 24 hours: Temp:  [97.6 F (36.4 C)-98.2 F (36.8 C)] 97.9 F (36.6 C) (11/03 1500) Pulse Rate:  [69-87] 69 (11/03 1500) Resp:  [16-17] 17 (11/03 1500) BP: (127-131)/(62-69) 127/66 (11/03 1500) SpO2:  [97 %-98 %] 97 % (11/03 1500) Physical Exam  Constitutional:  oriented to person, but sleepy. appears older than stated age and 2. No distress.  HENT: [place]/AT, PERRLA, no scleral icterus Mouth/Throat: Oropharynx is clear and moist. No oropharyngeal exudate.  Cardiovascular: Normal rate, regular rhythm and normal heart sounds. Exam reveals no gallop and no friction rub.  No murmur heard.  Pulmonary/Chest: Effort normal and breath sounds normal. No respiratory distress.  has no wheezes.  Chest wall  = right portacath in place is c/d/i no erythema Abdominal: Soft. Bowel sounds are normal.  exhibits no distension. There is no tenderness.  Lymphadenopathy: no cervical adenopathy. No axillary adenopathy Neurological: alert and oriented to person, place, and time.  Skin: Skin is warm and dry. No rash noted. No erythema.  Psychiatric: a normal mood and affect.  behavior is normal.    Lab Results Recent Labs    01/09/20 0436 01/10/20 0420  WBC 13.2* 10.3  HGB 8.5* 9.0*  HCT 26.4* 28.2*  NA  --  140  K  --  3.6  CL  --  97*  CO2  --  36*  BUN  --  20  CREATININE  --  0.46    Microbiology: 11/1 blood cx 4/4 bottles of enterobacter cloacae- sensitivities pending Studies/Results: DG CHEST PORT 1 VIEW  Result Date: 01/08/2020 CLINICAL DATA:  Acute respiratory failure with hypoxia. EXAM: PORTABLE CHEST 1 VIEW COMPARISON:  Radiograph 01/05/2020.  CT 09/14/2019 FINDINGS: Patient is rotated. Right chest port remains in place. Cavitary process at the right lung apex was better delineated on prior CT, grossly stable in radiographic appearance. Increasing volume loss in the left hemithorax as well as consolidation at the left lung base. There may be a left pleural effusion. Possible small right pleural effusion. Underlying emphysema and bronchial thickening. The heart is normal in size. Aortic atherosclerosis.  No pneumothorax. Bones are under mineralized. IMPRESSION: 1. Increasing volume loss in the left hemithorax with consolidation at the left lung base. Findings may be related to airspace consolidation such as pneumonia or atelectasis related to mucous plugging. 2. Possible pleural effusions. 3. Cavitary process at the right lung apex was better delineated on prior CT, grossly stable in radiographic appearance. Electronically Signed   By: Keith Rake M.D.   On: 01/08/2020 19:28     Assessment/Plan: Enterobacter cloacae bacteremia = thought to be due to pneumonia. Continue on cefepime.  Awaiting to see sensitivity to see if any oral abtx can be used. Plan for minimum of 7 days  Leukocytosis = improving, now back to baseline  Day-night reversal = will continue to encourage sleeping at night time.  Pulmonary m.abscessus/bronchiectasis = continue to hold off on treatment of m.abscessus  [facility] for Infectious Diseases Cell: [phone] Pager: [phone]  01/10/2020, 4:33 PM

## 2020-01-10 NOTE — Progress Notes (Signed)
PROGRESS NOTE    Olivia Hooper  FBP:[id number] DOB: 1940/06/12 DOA: 01/04/2020 PCP: [name], MD    Brief Narrative:  79 year old female with past medical history of chronic hypoxic respiratory failure on 2 L at baseline, COPD, rheumatoid arthritis, left breast cancer status post mastectomy, osteoporosis, hypertension, hyperlipidemia, Mycobacterium abscessus lung infection s/p treatment who presented to the ED with chief complaint of fall. She went to walk outside and fell down approximately 4 steps onto her left hip onto the concrete. Believes that she tripped over some carpeting which made her fall. No loss of consciousness, did not hit her head, not on blood thinners. No prodromal symptoms.  Assessment & Plan:   Principal Problem:   Closed left hip fracture, initial encounter [facility]) Active Problems:   COPD GOLD II / still smoking    Rheumatoid arthritis ([place])   Pulmonary mycobacterial infection ([place])   Anemia   Protein-calorie malnutrition, severe   Left hip fracture status post intramedullary fixation of left femur on 01/05/2020 by Dr. Lyla Glassing Pain control in place- Allergic to morphine, makes her hallucinate. Hgb thus far stable No evidence of acute blood loss PT OT recs for SNF Fall precautions TOC following for placement  Sepsis secondary to CAP, suspect POA, gram-negative rods bacteremia, POA History of Mycobacterium abscesses, follows with Dr. Wendie Agreste [facility] Meets SIRS criteria: Heart rate 110, RR 22, WBC 15.7K Received 1 dose of Zosyn on 01/08/2020 for possible Mycobacterium abscesses exacerbation which was discontinued per ID recommendation On 01/09/2020 blood cultures drawn on 01/08/2020 + for gram-negative rods 1 out of 2 bottles, seen in aerobic and anaerobic bottles. Started Cefepime on 01/09/20 Leukocytosis is also downtrending from 15K to 10K Presently afebrile Continue to monitor ID following. Recommendation to continue cefepime, pending  sensitivities. Plan minimum of 7 days of tx  Acute metabolic encephalopathy likely 2/2 to acute illness Cont to treat underlying infection Staff reports pt has been awake most of the night. Will give trial of seroquel at night Avoid sedating meds  Acute on chronic hypoxic hypercarbic respiratory failure initially requiring BIPAP, improving, likely 2/2 to atelectasis with underlying COPD and Mycobacterium abscesses On 2 L oxygen continuously by nasal cannula at baseline Became acutely hypoxic and hypercarbic after surgery in PACU with ABG revealing pH 7.2 and PCO2 of 73; on BiPAP for 48 hours. Repeated ABG showed worsening PCO2 65 from 64, pH normal and compensated.  Pulmonary was later consulted who recommended outpatient work-up, PFT outpatient. Patient will need to follow-up with pulmonary outpatient.  Iron deficiency anemia of unclear etiology Iron studies consistent with severe iron deficiency Baseline hemoglobin appears to be 9.7 with MCV of 89 Retic count of 1.9 Continue ferrous sulfate 325 mg twice daily Pt was transfused 2 unit PRBC perioperatively Repeat CBC in AM  Acute blood loss anemia post orthopedic surgery Management as stated above Hemoglobin is uptrending 8.5 from 8.0. No evidence of acute blood loss at this time Repeat cbc in AM  History of Mycobacterium abscessus  Follows with ID, Dr. Wendie Agreste  COPD C/w home bronchodilators Continue continuous oxygen supplementation as needed  Severe protein calorie malnutrition BMI 15  Albumin 2.3 Continue to encourage PO intake as tolerated Dietitian consulted  Ambulatory dysfunction post orthopedic surgery PT assessment recommended SNF with 24-hour supervision/assistance Continue PT OT with assistance and fall precautions Plan for SNF placement  DVT prophylaxis: Heparin subq Code Status: Full Family Communication: Pt in room, family not at bedside  Status is: Inpatient  Remains  inpatient appropriate  because:Unsafe d/c plan, IV treatments appropriate due to intensity of illness or inability to take PO and Inpatient level of care appropriate due to severity of illness   Dispo:  Patient From: Home  Planned Disposition: [place]  Expected discharge date: 01/12/20  Medically stable for discharge: No        Consultants:   Orthopedic Surgery  ID  Pulmonary  Procedures:   Left hip repair on 01/05/2020 by Dr. Lyla Glassing  Antimicrobials: Anti-infectives (From admission, onward)   Start     Dose/Rate Route Frequency Ordered Stop   01/09/20 2000  ceFEPIme (MAXIPIME) 2 g in sodium chloride 0.9 % 100 mL IVPB        2 g 200 mL/hr over 30 Minutes Intravenous Every 12 hours 01/09/20 0800     01/09/20 0800  ceFEPIme (MAXIPIME) 2 g in sodium chloride 0.9 % 100 mL IVPB        2 g 200 mL/hr over 30 Minutes Intravenous  Once 01/09/20 0652 01/09/20 1111   01/08/20 1200  piperacillin-tazobactam (ZOSYN) IVPB 3.375 g  Status:  Discontinued        3.375 g 12.5 mL/hr over 240 Minutes Intravenous Every 8 hours 01/08/20 1121 01/08/20 1807   01/05/20 1800  ceFAZolin (ANCEF) IVPB 2g/100 mL premix        2 g 200 mL/hr over 30 Minutes Intravenous Every 6 hours 01/05/20 1649 01/06/20 0411   01/05/20 0931  ceFAZolin (ANCEF) 2-4 GM/100ML-% IVPB       Note to Pharmacy: Mardelle Matte   : cabinet override      01/05/20 0931 01/05/20 2144   01/05/20 0600  ceFAZolin (ANCEF) IVPB 2g/100 mL premix  Status:  Discontinued        2 g 200 mL/hr over 30 Minutes Intravenous On call to O.R. 01/04/20 1501 01/04/20 1815       Subjective: Confused this AM, difficult to assess  Objective: Vitals:   01/09/20 2017 01/10/20 0649 01/10/20 0801 01/10/20 1500  BP: 130/62 131/69  127/66  Pulse: 87 74  69  Resp: 16 16  17   Temp: 98.2 F (36.8 C) 97.6 F (36.4 C)  97.9 F (36.6 C)  TempSrc: Oral Axillary  Oral  SpO2: 97% 98% 97% 97%  Weight:      Height:        Intake/Output Summary  (Last 24 hours) at 01/10/2020 1705 Last data filed at 01/10/2020 1500 Gross per 24 hour  Intake 280 ml  Output 2425 ml  Net -2145 ml   Filed Weights   01/04/20 1227  Weight: 36 kg    Examination:  General exam: Appears calm and comfortable  Respiratory system: Clear to auscultation. Respiratory effort normal. Cardiovascular system: S1 & S2 heard, Regular Gastrointestinal system: Abdomen is nondistended, soft and nontender. No organomegaly or masses felt. Normal bowel sounds heard. Central nervous system: alseep. No focal neurological deficits. Extremities: Symmetric 5 x 5 power. Skin: No rashes, lesions Psychiatry: difficult to assess given current mentation  Data Reviewed: I have personally reviewed following labs and imaging studies  CBC: Recent Labs  Lab 01/05/20 2128 01/05/20 2128 01/06/20 0251 01/07/20 0333 01/08/20 0537 01/09/20 0436 01/10/20 0420  WBC 13.8*   < > 12.7* 11.1* 15.7* 13.2* 10.3  NEUTROABS 11.3*  --   --   --   --  10.3* 7.6  HGB 11.4*   < > 10.8* 9.4* 8.0* 8.5* 9.0*  HCT 34.4*   < > 32.5* 30.0* 25.1*  26.4* 28.2*  MCV 91.5   < > 92.1 94.9 94.7 94.3 93.4  PLT 211   < > 206 167 148* 150 175   < > = values in this interval not displayed.   Basic Metabolic Panel: Recent Labs  Lab 01/04/20 1321 01/05/20 2128 01/06/20 0251 01/07/20 0333 01/10/20 0420  NA 137 136 138 137 140  K 4.3 4.7 4.5 4.0 3.6  CL 97* 97* 98 97* 97*  CO2 32 29 32 33* 36*  GLUCOSE 85 339* 154* 148* 99  BUN 21 18 19 20 20   CREATININE 0.80 0.75 0.76 0.62 0.46  CALCIUM 9.3 9.0 9.4 9.3 10.0  MG  --  1.7  --   --   --   PHOS  --  3.5  --   --   --    GFR: Estimated Creatinine Clearance: 31.9 mL/min (by C-G formula based on SCr of 0.46 mg/dL). Liver Function Tests: Recent Labs  Lab 01/04/20 1321 01/05/20 2128  AST 14* 13*  ALT 13 13  ALKPHOS 85 66  BILITOT 0.7 0.6  PROT 5.7* 5.2*  ALBUMIN 2.3* 2.0*   No results for input(s): LIPASE, AMYLASE in the last 168 hours. No  results for input(s): AMMONIA in the last 168 hours. Coagulation Profile: No results for input(s): INR, PROTIME in the last 168 hours. Cardiac Enzymes: No results for input(s): CKTOTAL, CKMB, CKMBINDEX, TROPONINI in the last 168 hours. BNP (last 3 results) No results for input(s): PROBNP in the last 8760 hours. HbA1C: Recent Labs    01/09/20 0452  HGBA1C 5.0   CBG: No results for input(s): GLUCAP in the last 168 hours. Lipid Profile: No results for input(s): CHOL, HDL, LDLCALC, TRIG, CHOLHDL, LDLDIRECT in the last 72 hours. Thyroid Function Tests: No results for input(s): TSH, T4TOTAL, FREET4, T3FREE, THYROIDAB in the last 72 hours. Anemia Panel: Recent Labs    01/08/20 0537  RETICCTPCT 1.9   Sepsis Labs: Recent Labs  Lab 01/08/20 0752 01/08/20 1830 01/09/20 0436 01/10/20 0420  PROCALCITON 23.48 17.80 16.88 7.78    Recent Results (from the past 240 hour(s))  Respiratory Panel by RT PCR (Flu A&B, Covid) - Nasopharyngeal Swab     Status: None   Collection Time: 01/04/20  1:46 PM   Specimen: Nasopharyngeal Swab  Result Value Ref Range Status   SARS Coronavirus 2 by RT PCR NEGATIVE NEGATIVE Final    Comment: (NOTE) SARS-CoV-2 target nucleic acids are NOT DETECTED.  The SARS-CoV-2 RNA is generally detectable in upper respiratoy specimens during the acute phase of infection. The lowest concentration of SARS-CoV-2 viral copies this assay can detect is 131 copies/mL. A negative result does not preclude SARS-Cov-2 infection and should not be used as the sole basis for treatment or other patient management decisions. A negative result may occur with  improper specimen collection/handling, submission of specimen other than nasopharyngeal swab, presence of viral mutation(s) within the areas targeted by this assay, and inadequate number of viral copies (<131 copies/mL). A negative result must be combined with clinical observations, patient history, and epidemiological  information. The expected result is Negative.  Fact Sheet for Patients:  PinkCheek.be  Fact Sheet for Healthcare Providers:  GravelBags.it  This test is no t yet approved or cleared by the [place] FDA and  has been authorized for detection and/or diagnosis of SARS-CoV-2 by FDA under an Emergency Use Authorization (EUA). This EUA will remain  in effect (meaning this test can be used) for the duration of the  COVID-19 declaration under Section 564(b)(1) of the Act, 21 U.S.C. section 360bbb-3(b)(1), unless the authorization is terminated or revoked sooner.     Influenza A by PCR NEGATIVE NEGATIVE Final   Influenza B by PCR NEGATIVE NEGATIVE Final    Comment: (NOTE) The Xpert Xpress SARS-CoV-2/FLU/RSV assay is intended as an aid in  the diagnosis of influenza from Nasopharyngeal swab specimens and  should not be used as a sole basis for treatment. Nasal washings and  aspirates are unacceptable for Xpert Xpress SARS-CoV-2/FLU/RSV  testing.  Fact Sheet for Patients: PinkCheek.be  Fact Sheet for Healthcare Providers: GravelBags.it  This test is not yet approved or cleared by the [place] FDA and  has been authorized for detection and/or diagnosis of SARS-CoV-2 by  FDA under an Emergency Use Authorization (EUA). This EUA will remain  in effect (meaning this test can be used) for the duration of the  Covid-19 declaration under Section 564(b)(1) of the Act, 21  U.S.C. section 360bbb-3(b)(1), unless the authorization is  terminated or revoked. Performed at [facility], [place] [address], [place], [place] [address]   Surgical PCR screen     Status: None   Collection Time: 01/04/20  8:49 PM   Specimen: Nasal Mucosa; Nasal Swab  Result Value Ref Range Status   MRSA, PCR NEGATIVE NEGATIVE Final   Staphylococcus aureus NEGATIVE NEGATIVE Final     Comment: (NOTE) The Xpert SA Assay (FDA approved for NASAL specimens in patients 21 years of age and older), is one component of a comprehensive surveillance program. It is not intended to diagnose infection nor to guide or monitor treatment. Performed at [facility], [place] [address] [place], [place] [address]   Culture, blood (routine x 2)     Status: None (Preliminary result)   Collection Time: 01/08/20 11:16 AM   Specimen: BLOOD  Result Value Ref Range Status   Specimen Description   Final    BLOOD RIGHT ANTECUBITAL Performed at [facility] [address], [place], [place] [address]    Special Requests   Final    BOTTLES DRAWN AEROBIC AND ANAEROBIC Blood Culture results may not be optimal due to an inadequate volume of blood received in culture bottles Performed at [facility] [address] [place], [place] [address]    Culture  Setup Time   Final    GRAM NEGATIVE RODS IN BOTH AEROBIC AND ANAEROBIC BOTTLES CRITICAL VALUE NOTED.  VALUE IS CONSISTENT WITH PREVIOUSLY REPORTED AND CALLED VALUE.    Culture   Final    GRAM NEGATIVE RODS IDENTIFICATION TO FOLLOW Performed at [facility], [place] [address], [place], [place] [address]    Report Status PENDING  Incomplete  Culture, blood (routine x 2)     Status: Abnormal (Preliminary result)   Collection Time: 01/08/20 11:16 AM   Specimen: BLOOD RIGHT HAND  Result Value Ref Range Status   Specimen Description   Final    BLOOD RIGHT HAND Performed at [facility] [address] [place], [place] [address]    Special Requests   Final    BOTTLES DRAWN AEROBIC AND ANAEROBIC Blood Culture adequate volume Performed at [facility] [address], [place], [place] [address]    Culture  Setup Time   Final    GRAM NEGATIVE RODS IN BOTH AEROBIC AND ANAEROBIC BOTTLES CRITICAL RESULT CALLED TO, READ BACK BY AND VERIFIED WITH: PHARMD  D LOFFORD [id number] AT  725 AM BY CM    Culture (A)  Final    ENTEROBACTER CLOACAE SUSCEPTIBILITIES TO FOLLOW Performed at [facility], [place] [address], [place], [place] [address]    Report Status PENDING  Incomplete  Blood Culture ID Panel (Reflexed)     Status: Abnormal (Preliminary result)   Collection Time: 01/08/20 11:16 AM  Result Value Ref Range Status   Enterococcus faecalis NOT DETECTED NOT DETECTED Final   Enterococcus Faecium NOT DETECTED NOT DETECTED Final   Listeria monocytogenes NOT DETECTED NOT DETECTED Final   Staphylococcus species NOT DETECTED NOT DETECTED Final   Staphylococcus aureus (BCID) NOT DETECTED NOT DETECTED Final   Staphylococcus epidermidis NOT DETECTED NOT DETECTED Final   Staphylococcus lugdunensis NOT DETECTED NOT DETECTED Final   Streptococcus species NOT DETECTED NOT DETECTED Final   Streptococcus agalactiae NOT DETECTED NOT DETECTED Final   Streptococcus pneumoniae NOT DETECTED NOT DETECTED Final   Streptococcus pyogenes NOT DETECTED NOT DETECTED Final   A.calcoaceticus-baumannii NOT DETECTED NOT DETECTED Final   Bacteroides fragilis NOT DETECTED NOT DETECTED Final   Enterobacterales PENDING NOT DETECTED Incomplete   Enterobacter cloacae complex DETECTED (A) NOT DETECTED Final    Comment: CRITICAL RESULT CALLED TO, READ BACK BY AND VERIFIED WITH: PHARMD D LOFFORD [id number] AT 7025 AM BY CM    Escherichia coli NOT DETECTED NOT DETECTED Final   Klebsiella aerogenes NOT DETECTED NOT DETECTED Final   Klebsiella oxytoca NOT DETECTED NOT DETECTED Final   Klebsiella pneumoniae NOT DETECTED NOT DETECTED Final   Proteus species NOT DETECTED NOT DETECTED Final   Salmonella species NOT DETECTED NOT DETECTED Final   Serratia marcescens NOT DETECTED NOT DETECTED Final   Haemophilus influenzae NOT DETECTED NOT DETECTED Final   Neisseria meningitidis NOT DETECTED NOT DETECTED Final   Pseudomonas aeruginosa NOT DETECTED NOT DETECTED Final   Stenotrophomonas  maltophilia NOT DETECTED NOT DETECTED Final   Candida albicans NOT DETECTED NOT DETECTED Final   Candida auris NOT DETECTED NOT DETECTED Final   Candida glabrata NOT DETECTED NOT DETECTED Final   Candida krusei NOT DETECTED NOT DETECTED Final   Candida parapsilosis NOT DETECTED NOT DETECTED Final   Candida tropicalis NOT DETECTED NOT DETECTED Final   Cryptococcus neoformans/gattii NOT DETECTED NOT DETECTED Final   CTX-M ESBL NOT DETECTED NOT DETECTED Final   Carbapenem resistance IMP NOT DETECTED NOT DETECTED Final   Carbapenem resistance KPC NOT DETECTED NOT DETECTED Final   Carbapenem resistance NDM NOT DETECTED NOT DETECTED Final   Carbapenem resist OXA 48 LIKE NOT DETECTED NOT DETECTED Final   Carbapenem resistance VIM NOT DETECTED NOT DETECTED Final    Comment: Performed at [facility], [place] [address], [place], [place] [address]     Radiology Studies: DG CHEST PORT 1 VIEW  Result Date: 01/08/2020 CLINICAL DATA:  Acute respiratory failure with hypoxia. EXAM: PORTABLE CHEST 1 VIEW COMPARISON:  Radiograph 01/05/2020.  CT 09/14/2019 FINDINGS: Patient is rotated. Right chest port remains in place. Cavitary process at the right lung apex was better delineated on prior CT, grossly stable in radiographic appearance. Increasing volume loss in the left hemithorax as well as consolidation at the left lung base. There may be a left pleural effusion. Possible small right pleural effusion. Underlying emphysema and bronchial thickening. The heart is normal in size. Aortic atherosclerosis. No pneumothorax. Bones are under mineralized. IMPRESSION: 1. Increasing volume loss in the left hemithorax with consolidation at the left lung base. Findings may be related to airspace consolidation such  as pneumonia or atelectasis related to mucous plugging. 2. Possible pleural effusions. 3. Cavitary process at the right lung apex was better delineated on prior CT, grossly stable in radiographic appearance.  Electronically Signed   By: Keith Rake M.D.   On: 01/08/2020 19:28    Scheduled Meds: . ascorbic acid  500 mg Oral BID  . Chlorhexidine Gluconate Cloth  6 each Topical Daily  . docusate sodium  100 mg Oral BID  . feeding supplement  237 mL Oral TID BM  . ferrous sulfate  325 mg Oral BID WC  . [START ON 01/11/2020] heparin  5,000 Units Subcutaneous Q8H  . mouth rinse  15 mL Mouth Rinse BID  . mometasone-formoterol  2 puff Inhalation BID  . multivitamin with minerals  1 tablet Oral Daily  . QUEtiapine  25 mg Oral QHS  . senna  1 tablet Oral BID  . sodium chloride flush  10-40 mL Intracatheter Q12H  . sodium chloride flush  10-40 mL Intracatheter Q12H   Continuous Infusions: . sodium chloride 75 mL/hr at 01/10/20 1254  . ceFEPime (MAXIPIME) IV 2 g (01/10/20 0849)  . methocarbamol (ROBAXIN) IV       LOS: 6 days   Marylu Lund, MD [facility] Pager On Amion  If 7PM-7AM, please contact night-coverage 01/10/2020, 5:06 PM

## 2020-01-11 ENCOUNTER — Telehealth: Payer: Self-pay | Admitting: Pharmacist

## 2020-01-11 DIAGNOSIS — J181 Lobar pneumonia, unspecified organism: Secondary | ICD-10-CM

## 2020-01-11 DIAGNOSIS — R7881 Bacteremia: Secondary | ICD-10-CM | POA: Diagnosis not present

## 2020-01-11 DIAGNOSIS — J9601 Acute respiratory failure with hypoxia: Secondary | ICD-10-CM | POA: Diagnosis not present

## 2020-01-11 DIAGNOSIS — S72002A Fracture of unspecified part of neck of left femur, initial encounter for closed fracture: Secondary | ICD-10-CM | POA: Diagnosis not present

## 2020-01-11 DIAGNOSIS — A310 Pulmonary mycobacterial infection: Secondary | ICD-10-CM | POA: Diagnosis not present

## 2020-01-11 DIAGNOSIS — B9689 Other specified bacterial agents as the cause of diseases classified elsewhere: Secondary | ICD-10-CM

## 2020-01-11 LAB — CBC
HCT: 27.7 % — ABNORMAL LOW (ref 36.0–46.0)
Hemoglobin: 8.7 g/dL — ABNORMAL LOW (ref 12.0–15.0)
MCH: 29.6 pg (ref 26.0–34.0)
MCHC: 31.4 g/dL (ref 30.0–36.0)
MCV: 94.2 fL (ref 80.0–100.0)
Platelets: 211 10*3/uL (ref 150–400)
RBC: 2.94 MIL/uL — ABNORMAL LOW (ref 3.87–5.11)
RDW: 14.8 % (ref 11.5–15.5)
WBC: 8.1 10*3/uL (ref 4.0–10.5)
nRBC: 0 % (ref 0.0–0.2)

## 2020-01-11 LAB — COMPREHENSIVE METABOLIC PANEL
ALT: 17 U/L (ref 0–44)
AST: 20 U/L (ref 15–41)
Albumin: 1.6 g/dL — ABNORMAL LOW (ref 3.5–5.0)
Alkaline Phosphatase: 90 U/L (ref 38–126)
Anion gap: 6 (ref 5–15)
BUN: 18 mg/dL (ref 8–23)
CO2: 34 mmol/L — ABNORMAL HIGH (ref 22–32)
Calcium: 9.4 mg/dL (ref 8.9–10.3)
Chloride: 99 mmol/L (ref 98–111)
Creatinine, Ser: 0.53 mg/dL (ref 0.44–1.00)
GFR, Estimated: >60 mL/min (ref >60)
Glucose, Bld: 93 mg/dL (ref 70–99)
Potassium: 3.6 mmol/L (ref 3.5–5.1)
Sodium: 139 mmol/L (ref 135–145)
Total Bilirubin: 0.7 mg/dL (ref 0.3–1.2)
Total Protein: 4.6 g/dL — ABNORMAL LOW (ref 6.5–8.1)

## 2020-01-11 LAB — PROCALCITONIN: Procalcitonin: 5 ng/mL

## 2020-01-11 LAB — CULTURE, BLOOD (ROUTINE X 2): Special Requests: ADEQUATE

## 2020-01-11 NOTE — Telephone Encounter (Signed)
2 bottles of clofazimine are in the pharmacy office for patient when she comes to see Dr. Tommy Medal on 11/16.

## 2020-01-11 NOTE — Progress Notes (Signed)
PROGRESS NOTE    Olivia Hooper  LKT:[id number] DOB: 1940-12-13 DOA: 01/04/2020 PCP: [name], MD    Brief Narrative:  79 year old female with past medical history of chronic hypoxic respiratory failure on 2 L at baseline, COPD, rheumatoid arthritis, left breast cancer status post mastectomy, osteoporosis, hypertension, hyperlipidemia, Mycobacterium abscessus lung infection s/p treatment who presented to the ED with chief complaint of fall. She went to walk outside and fell down approximately 4 steps onto her left hip onto the concrete. Believes that she tripped over some carpeting which made her fall. No loss of consciousness, did not hit her head, not on blood thinners. No prodromal symptoms.  Assessment & Plan:   Principal Problem:   Closed left hip fracture, initial encounter [facility]) Active Problems:   COPD GOLD II / still smoking    Rheumatoid arthritis ([place])   Pulmonary mycobacterial infection ([place])   Anemia   Protein-calorie malnutrition, severe   Left hip fracture status post intramedullary fixation of left femur on 01/05/2020 by Dr. Lyla Glassing Pain control in place- Allergic to morphine, makes her hallucinate. Hgb thus far stable No evidence of acute blood loss PT OT recs for SNF Fall precautions Planning for SNF at time of d/c  Sepsis secondary to CAP, suspect POA, gram-negative rods bacteremia, POA History of Mycobacterium abscesses, follows with Dr. Wendie Agreste at [facility] Meets SIRS criteria: Heart rate 110, RR 22, WBC 15.7K Received 1 dose of Zosyn on 01/08/2020 for possible Mycobacterium abscesses exacerbation which was discontinued per ID recommendation On 01/09/2020 blood cultures drawn on 01/08/2020 + for gram-negative rods 1 out of 2 bottles, seen in aerobic and anaerobic bottles. Started Cefepime on 01/09/20 Leukocytosis is also downtrending from 15K to 10K Presently afebrile Continue to monitor ID following.Pt is currently continued on cefepime. Plan minimum  of 7 days of tx. Enterobacter noted to be resistant to cefazolin, otherwise pan-sensitive Defer to ID regarding PO abx regimen   Acute metabolic encephalopathy likely 2/2 to acute illness Cont to treat underlying infection Pt recently noted to have issues with day/night reversal with confusion Good results noted with trial of QHS seroquel Cont to avoid sedating meds  Acute on chronic hypoxic hypercarbic respiratory failure initially requiring BIPAP, improving, likely 2/2 to atelectasis with underlying COPD and Mycobacterium abscesses On 2 L oxygen continuously by nasal cannula at baseline Became acutely hypoxic and hypercarbic after surgery in PACU with ABG revealing pH 7.2 and PCO2 of 73; on BiPAP for 48 hours. Repeated ABG showed worsening PCO2 65 from 64, pH normal and compensated.  Pulmonary was later consulted who recommended outpatient work-up, PFT outpatient.  Recommend follow-up with pulmonary outpatient.  Iron deficiency anemia of unclear etiology Iron studies consistent with severe iron deficiency Baseline hemoglobin appears to be 9.7 with MCV of 89 Retic count of 1.9 Continue ferrous sulfate 325 mg twice daily Pt was transfused 2 unit PRBC perioperatively Cont to follow CBC trends  Acute blood loss anemia post orthopedic surgery Management as stated above Hemoglobin is uptrending 8.5 from 8.0. No evidence of acute blood loss at this time Cont to follow CBC trends  History of Mycobacterium abscessus  Follows with ID, Dr. Wendie Agreste  COPD C/w home bronchodilators Continue continuous oxygen supplementation as needed  Severe protein calorie malnutrition BMI 15  Albumin 2.3 Continue to encourage PO intake as tolerated Dietitian consulted  Ambulatory dysfunction post orthopedic surgery PT assessment recommended SNF with 24-hour supervision/assistance Continue PT OT with assistance and fall precautions Plan for SNF placement,  likely in 24-48hrs  DVT  prophylaxis: Heparin subq Code Status: Full Family Communication: Pt in room, pt's son currently at bedside  Status is: Inpatient  Remains inpatient appropriate because:Unsafe d/c plan, IV treatments appropriate due to intensity of illness or inability to take PO and Inpatient level of care appropriate due to severity of illness   Dispo:  Patient From: Home  Planned Disposition: [facility]  Expected discharge date: 01/12/20  Medically stable for discharge: No  Consultants:   Orthopedic Surgery  ID  Pulmonary  Procedures:   Left hip repair on 01/05/2020 by Dr. Lyla Glassing  Antimicrobials: Anti-infectives (From admission, onward)   Start     Dose/Rate Route Frequency Ordered Stop   01/09/20 2000  ceFEPIme (MAXIPIME) 2 g in sodium chloride 0.9 % 100 mL IVPB        2 g 200 mL/hr over 30 Minutes Intravenous Every 12 hours 01/09/20 0800     01/09/20 0800  ceFEPIme (MAXIPIME) 2 g in sodium chloride 0.9 % 100 mL IVPB        2 g 200 mL/hr over 30 Minutes Intravenous  Once 01/09/20 0652 01/09/20 1111   01/08/20 1200  piperacillin-tazobactam (ZOSYN) IVPB 3.375 g  Status:  Discontinued        3.375 g 12.5 mL/hr over 240 Minutes Intravenous Every 8 hours 01/08/20 1121 01/08/20 1807   01/05/20 1800  ceFAZolin (ANCEF) IVPB 2g/100 mL premix        2 g 200 mL/hr over 30 Minutes Intravenous Every 6 hours 01/05/20 1649 01/06/20 0411   01/05/20 0931  ceFAZolin (ANCEF) 2-4 GM/100ML-% IVPB       Note to Pharmacy: Mardelle Matte   : cabinet override      01/05/20 0931 01/05/20 2144   01/05/20 0600  ceFAZolin (ANCEF) IVPB 2g/100 mL premix  Status:  Discontinued        2 g 200 mL/hr over 30 Minutes Intravenous On call to O.R. 01/04/20 1501 01/04/20 1815      Subjective: Pt seen while eating lunch. Pt much more alert and appropriately conversant. Without complaints  Objective: Vitals:   01/10/20 2018 01/10/20 2038 01/11/20 0432 01/11/20 1515  BP:  (!) 115/48 104/78 (!)  109/57  Pulse:  82 84 90  Resp:  16 16 18   Temp:  97.7 F (36.5 C) 97.8 F (36.6 C) 98.9 F (37.2 C)  TempSrc:  Axillary Oral Oral  SpO2: 96% 97% 100% 98%  Weight:      Height:        Intake/Output Summary (Last 24 hours) at 01/11/2020 1637 Last data filed at 01/11/2020 1307 Gross per 24 hour  Intake 2601.32 ml  Output 1700 ml  Net 901.32 ml   Filed Weights   01/04/20 1227  Weight: 36 kg    Examination: General exam: Awake, sitting in chair Respiratory system: Normal respiratory effort, no wheezing Cardiovascular system: regular rate, s1, s2 Gastrointestinal system: Soft, nondistended, positive BS Central nervous system: CN2-12 grossly intact, strength intact Extremities: Perfused, no clubbing Skin: Normal skin turgor, no notable skin lesions seen Psychiatry: Mood normal // no visual hallucinations   Data Reviewed: I have personally reviewed following labs and imaging studies  CBC: Recent Labs  Lab 01/05/20 2128 01/06/20 0251 01/07/20 0333 01/08/20 0537 01/09/20 0436 01/10/20 0420 01/11/20 0335  WBC 13.8*   < > 11.1* 15.7* 13.2* 10.3 8.1  NEUTROABS 11.3*  --   --   --  10.3* 7.6  --   HGB 11.4*   < >  9.4* 8.0* 8.5* 9.0* 8.7*  HCT 34.4*   < > 30.0* 25.1* 26.4* 28.2* 27.7*  MCV 91.5   < > 94.9 94.7 94.3 93.4 94.2  PLT 211   < > 167 148* 150 175 211   < > = values in this interval not displayed.   Basic Metabolic Panel: Recent Labs  Lab 01/05/20 2128 01/06/20 0251 01/07/20 0333 01/10/20 0420 01/11/20 0335  NA 136 138 137 140 139  K 4.7 4.5 4.0 3.6 3.6  CL 97* 98 97* 97* 99  CO2 29 32 33* 36* 34*  GLUCOSE 339* 154* 148* 99 93  BUN 18 19 20 20 18   CREATININE 0.75 0.76 0.62 0.46 0.53  CALCIUM 9.0 9.4 9.3 10.0 9.4  MG 1.7  --   --   --   --   PHOS 3.5  --   --   --   --    GFR: Estimated Creatinine Clearance: 31.9 mL/min (by C-G formula based on SCr of 0.53 mg/dL). Liver Function Tests: Recent Labs  Lab 01/05/20 2128 01/11/20 0335  AST 13* 20    ALT 13 17  ALKPHOS 66 90  BILITOT 0.6 0.7  PROT 5.2* 4.6*  ALBUMIN 2.0* 1.6*   No results for input(s): LIPASE, AMYLASE in the last 168 hours. No results for input(s): AMMONIA in the last 168 hours. Coagulation Profile: No results for input(s): INR, PROTIME in the last 168 hours. Cardiac Enzymes: No results for input(s): CKTOTAL, CKMB, CKMBINDEX, TROPONINI in the last 168 hours. BNP (last 3 results) No results for input(s): PROBNP in the last 8760 hours. HbA1C: Recent Labs    01/09/20 0452  HGBA1C 5.0   CBG: No results for input(s): GLUCAP in the last 168 hours. Lipid Profile: No results for input(s): CHOL, HDL, LDLCALC, TRIG, CHOLHDL, LDLDIRECT in the last 72 hours. Thyroid Function Tests: No results for input(s): TSH, T4TOTAL, FREET4, T3FREE, THYROIDAB in the last 72 hours. Anemia Panel: No results for input(s): VITAMINB12, FOLATE, FERRITIN, TIBC, IRON, RETICCTPCT in the last 72 hours. Sepsis Labs: Recent Labs  Lab 01/08/20 1830 01/09/20 0436 01/10/20 0420 01/11/20 0335  PROCALCITON 17.80 16.88 7.78 5.00    Recent Results (from the past 240 hour(s))  Respiratory Panel by RT PCR (Flu A&B, Covid) - Nasopharyngeal Swab     Status: None   Collection Time: 01/04/20  1:46 PM   Specimen: Nasopharyngeal Swab  Result Value Ref Range Status   SARS Coronavirus 2 by RT PCR NEGATIVE NEGATIVE Final    Comment: (NOTE) SARS-CoV-2 target nucleic acids are NOT DETECTED.  The SARS-CoV-2 RNA is generally detectable in upper respiratoy specimens during the acute phase of infection. The lowest concentration of SARS-CoV-2 viral copies this assay can detect is 131 copies/mL. A negative result does not preclude SARS-Cov-2 infection and should not be used as the sole basis for treatment or other patient management decisions. A negative result may occur with  improper specimen collection/handling, submission of specimen other than nasopharyngeal swab, presence of viral mutation(s)  within the areas targeted by this assay, and inadequate number of viral copies (<131 copies/mL). A negative result must be combined with clinical observations, patient history, and epidemiological information. The expected result is Negative.  Fact Sheet for Patients:  PinkCheek.be  Fact Sheet for Healthcare Providers:  GravelBags.it  This test is no t yet approved or cleared by the [place] FDA and  has been authorized for detection and/or diagnosis of SARS-CoV-2 by FDA under an Emergency Use Authorization (  EUA). This EUA will remain  in effect (meaning this test can be used) for the duration of the COVID-19 declaration under Section 564(b)(1) of the Act, 21 U.S.C. section 360bbb-3(b)(1), unless the authorization is terminated or revoked sooner.     Influenza A by PCR NEGATIVE NEGATIVE Final   Influenza B by PCR NEGATIVE NEGATIVE Final    Comment: (NOTE) The Xpert Xpress SARS-CoV-2/FLU/RSV assay is intended as an aid in  the diagnosis of influenza from Nasopharyngeal swab specimens and  should not be used as a sole basis for treatment. Nasal washings and  aspirates are unacceptable for Xpert Xpress SARS-CoV-2/FLU/RSV  testing.  Fact Sheet for Patients: PinkCheek.be  Fact Sheet for Healthcare Providers: GravelBags.it  This test is not yet approved or cleared by the [place] FDA and  has been authorized for detection and/or diagnosis of SARS-CoV-2 by  FDA under an Emergency Use Authorization (EUA). This EUA will remain  in effect (meaning this test can be used) for the duration of the  Covid-19 declaration under Section 564(b)(1) of the Act, 21  U.S.C. section 360bbb-3(b)(1), unless the authorization is  terminated or revoked. Performed at [facility], [place] [address] [place], [place] [address]   Surgical PCR screen     Status:  None   Collection Time: 01/04/20  8:49 PM   Specimen: Nasal Mucosa; Nasal Swab  Result Value Ref Range Status   MRSA, PCR NEGATIVE NEGATIVE Final   Staphylococcus aureus NEGATIVE NEGATIVE Final    Comment: (NOTE) The Xpert SA Assay (FDA approved for NASAL specimens in patients 64 years of age and older), is one component of a comprehensive surveillance program. It is not intended to diagnose infection nor to guide or monitor treatment. Performed at [facility], [place] [address], [place], [place] [address]   Culture, blood (routine x 2)     Status: Abnormal   Collection Time: 01/08/20 11:16 AM   Specimen: BLOOD  Result Value Ref Range Status   Specimen Description   Final    BLOOD RIGHT ANTECUBITAL Performed at [facility] [address] [place], [place] [address]    Special Requests   Final    BOTTLES DRAWN AEROBIC AND ANAEROBIC Blood Culture results may not be optimal due to an inadequate volume of blood received in culture bottles Performed at [facility] [address] [place], [place] [address]    Culture  Setup Time   Final    GRAM NEGATIVE RODS IN BOTH AEROBIC AND ANAEROBIC BOTTLES CRITICAL VALUE NOTED.  VALUE IS CONSISTENT WITH PREVIOUSLY REPORTED AND CALLED VALUE.    Culture (A)  Final    ENTEROBACTER CLOACAE SUSCEPTIBILITIES PERFORMED ON PREVIOUS CULTURE WITHIN THE LAST 5 DAYS. Performed at [facility], [place] [address] [place], [place] [address]    Report Status 01/11/2020 FINAL  Final  Culture, blood (routine x 2)     Status: Abnormal   Collection Time: 01/08/20 11:16 AM   Specimen: BLOOD RIGHT HAND  Result Value Ref Range Status   Specimen Description   Final    BLOOD RIGHT HAND Performed at [facility] [address], [place], [place] [address]    Special Requests   Final    BOTTLES DRAWN AEROBIC AND ANAEROBIC Blood Culture adequate volume Performed at [facility] [address] [place], [place] [address]    Culture  Setup Time   Final    GRAM NEGATIVE  RODS IN BOTH AEROBIC AND ANAEROBIC BOTTLES CRITICAL RESULT CALLED TO, READ BACK BY AND VERIFIED WITH: PHARMD D LOFFORD [id number] AT 725 AM BY CM Performed at [facility], [place] [address], [place], [place] [address]    Culture ENTEROBACTER CLOACAE (A)  Final   Report Status 01/11/2020 FINAL  Final   Organism ID, Bacteria ENTEROBACTER CLOACAE  Final      Susceptibility   Enterobacter cloacae - MIC*    CEFAZOLIN >=64 RESISTANT Resistant     CEFEPIME <=0.12 SENSITIVE Sensitive     CEFTAZIDIME 4 SENSITIVE Sensitive     CIPROFLOXACIN <=0.25 SENSITIVE Sensitive     GENTAMICIN <=1 SENSITIVE Sensitive     IMIPENEM 0.5 SENSITIVE Sensitive     TRIMETH/SULFA <=20 SENSITIVE Sensitive     PIP/TAZO 16 SENSITIVE Sensitive     * ENTEROBACTER CLOACAE  Blood Culture ID Panel (Reflexed)     Status: Abnormal (Preliminary result)   Collection Time: 01/08/20 11:16 AM  Result Value Ref Range Status   Enterococcus faecalis NOT DETECTED NOT DETECTED Final   Enterococcus Faecium NOT DETECTED NOT DETECTED Final   Listeria monocytogenes NOT DETECTED NOT DETECTED Final   Staphylococcus species NOT DETECTED NOT DETECTED Final   Staphylococcus aureus (BCID) NOT DETECTED NOT DETECTED Final   Staphylococcus epidermidis NOT DETECTED NOT DETECTED Final   Staphylococcus lugdunensis NOT DETECTED NOT DETECTED Final   Streptococcus species NOT DETECTED NOT DETECTED Final   Streptococcus agalactiae NOT DETECTED NOT DETECTED Final   Streptococcus pneumoniae NOT DETECTED NOT DETECTED Final   Streptococcus pyogenes NOT DETECTED NOT DETECTED Final   A.calcoaceticus-baumannii NOT DETECTED NOT DETECTED Final   Bacteroides fragilis NOT DETECTED NOT DETECTED Final   Enterobacterales PENDING NOT DETECTED Incomplete   Enterobacter cloacae complex DETECTED (A) NOT DETECTED Final    Comment: CRITICAL RESULT CALLED  TO, READ BACK BY AND VERIFIED WITH: PHARMD D LOFFORD [id number] AT 7025 AM BY CM    Escherichia coli NOT DETECTED NOT DETECTED Final   Klebsiella aerogenes NOT DETECTED NOT DETECTED Final   Klebsiella oxytoca NOT DETECTED NOT DETECTED Final   Klebsiella pneumoniae NOT DETECTED NOT DETECTED Final   Proteus species NOT DETECTED NOT DETECTED Final   Salmonella species NOT DETECTED NOT DETECTED Final   Serratia marcescens NOT DETECTED NOT DETECTED Final   Haemophilus influenzae NOT DETECTED NOT DETECTED Final   Neisseria meningitidis NOT DETECTED NOT DETECTED Final   Pseudomonas aeruginosa NOT DETECTED NOT DETECTED Final   Stenotrophomonas maltophilia NOT DETECTED NOT DETECTED Final   Candida albicans NOT DETECTED NOT DETECTED Final   Candida auris NOT DETECTED NOT DETECTED Final   Candida glabrata NOT DETECTED NOT DETECTED Final   Candida krusei NOT DETECTED NOT DETECTED Final   Candida parapsilosis NOT DETECTED NOT DETECTED Final   Candida tropicalis NOT DETECTED NOT DETECTED Final   Cryptococcus neoformans/gattii NOT DETECTED NOT DETECTED Final   CTX-M ESBL NOT DETECTED NOT DETECTED Final   Carbapenem resistance IMP NOT DETECTED NOT DETECTED Final   Carbapenem resistance KPC NOT DETECTED NOT DETECTED Final   Carbapenem resistance NDM NOT DETECTED NOT DETECTED Final   Carbapenem resist OXA 48 LIKE NOT DETECTED NOT DETECTED Final   Carbapenem resistance VIM NOT DETECTED NOT DETECTED Final    Comment: Performed at [facility], [address] [place], [place] [address]     Radiology Studies: No results found.  Scheduled Meds: . ascorbic acid  500 mg Oral BID  . Chlorhexidine Gluconate Cloth  6  each Topical Daily  . docusate sodium  100 mg Oral BID  . feeding supplement  237 mL Oral TID BM  . ferrous sulfate  325 mg Oral BID WC  . heparin  5,000 Units Subcutaneous Q8H  . mouth rinse  15 mL Mouth Rinse BID  . mometasone-formoterol  2 puff Inhalation BID  . multivitamin with  minerals  1 tablet Oral Daily  . QUEtiapine  25 mg Oral QHS  . senna  1 tablet Oral BID  . sodium chloride flush  10-40 mL Intracatheter Q12H  . sodium chloride flush  10-40 mL Intracatheter Q12H   Continuous Infusions: . sodium chloride 75 mL/hr at 01/11/20 1307  . ceFEPime (MAXIPIME) IV 2 g (01/11/20 0819)  . methocarbamol (ROBAXIN) IV       LOS: 7 days   Marylu Lund, MD [facility] Pager On Amion  If 7PM-7AM, please contact night-coverage 01/11/2020, 4:37 PM

## 2020-01-11 NOTE — Progress Notes (Signed)
Physical Therapy Treatment Patient Details Name: Olivia Hooper MRN: [id number] DOB: Aug 06, 1940 Today's Date: 01/11/2020    History of Present Illness This is an 79 year old female with past medical history of chronic hypoxic respiratory failure on 2 L at baseline, COPD, rheumatoid arthritis, left breast cancer status post mastectomy, osteoporosis, hypertension, hyperlipidemia, Mycobacterium abscessus lung infection s/p treatment who presented to the ED with chief complaint of fall.Left hip x-ray positive for left femur subtrochanteric fracture with extension to greater and lesser trochanters. S/PIntramedullary fixation, Left femur.01/05/20: Some hypoxia and hypercarbia post surgery,requiring BiPAP  , likely secondary to atelectasis seen on chest x-ray.    PT Comments    Pt making excellent progress.  Required cues for safe transfer techniques and correct exercise form.  Pt was able to transfer and ambulate with min-mod A of 2 and use of EVA walker.  Continue POC.   Follow Up Recommendations  SNF;Supervision/Assistance - 24 hour     Equipment Recommendations  None recommended by PT    Recommendations for Other Services       Precautions / Restrictions Precautions Precautions: Fall Restrictions LLE Weight Bearing: Weight bearing as tolerated    Mobility  Bed Mobility Overal bed mobility: Needs Assistance Bed Mobility: Supine to Sit     Supine to sit: Mod assist     General bed mobility comments: assist with legs and trunk to sit up on bed edge , patient initiating moving legs and to sit up.  Transfers Overall transfer level: Needs assistance Equipment used: Bilateral platform walker Transfers: Sit to/from Stand Sit to Stand: Min assist;+2 safety/equipment         General transfer comment: Used Platform/EVA RW to stand with min A to boost.  Cues and assist for leg placement  Ambulation/Gait Ambulation/Gait assistance: +2 safety/equipment;Mod assist Gait Distance  (Feet): 5 Feet Assistive device: Bilateral platform walker (EVA) Gait Pattern/deviations: Step-to pattern;Decreased stride length Gait velocity: decreased   General Gait Details: Cues for sequencing, assist to control EVA walker; assist for steadying and to lift weight during L stance   Stairs             Wheelchair Mobility    Modified Rankin (Stroke Patients Only)       Balance Overall balance assessment: History of Falls;Needs assistance Sitting-balance support: Feet supported;Bilateral upper extremity supported Sitting balance-Leahy Scale: Poor Sitting balance - Comments: REquired use of UE   Standing balance support: Bilateral upper extremity supported;During functional activity Standing balance-Leahy Scale: Poor Standing balance comment: Required RW and min-mod A                            Cognition Arousal/Alertness: Awake/alert Behavior During Therapy: WFL for tasks assessed/performed Overall Cognitive Status: Within Functional Limits for tasks assessed                                        Exercises General Exercises - Lower Extremity Ankle Circles/Pumps: AROM;Both;10 reps;Seated Quad Sets: AROM;Both;10 reps;Seated Long Arc Quad: AAROM;Left;10 reps;Seated Hip ABduction/ADduction: AAROM;Left;10 reps;Supine Hip Flexion/Marching: AAROM;Left;Supine;Other (comment) (too painful, only 2 reps)    General Comments General comments (skin integrity, edema, etc.): Family member present      Pertinent Vitals/Pain Pain Assessment: Faces Faces Pain Scale: Hurts little more Pain Location: left hip to knee (with movement) Pain Descriptors / Indicators: Grimacing Pain Intervention(s): Monitored during session;Limited activity within  patient's tolerance;Repositioned    Home Living                      Prior Function            PT Goals (current goals can now be found in the care plan section) Acute Rehab PT Goals Patient  Stated Goal: agreed to get up PT Goal Formulation: With patient/family Time For Goal Achievement: 01/20/20 Potential to Achieve Goals: Good Progress towards PT goals: Progressing toward goals    Frequency    Min 3X/week      PT Plan Current plan remains appropriate;Frequency needs to be updated    Co-evaluation              AM-PAC PT "6 Clicks" Mobility   Outcome Measure  Help needed turning from your back to your side while in a flat bed without using bedrails?: A Lot Help needed moving from lying on your back to sitting on the side of a flat bed without using bedrails?: A Lot Help needed moving to and from a bed to a chair (including a wheelchair)?: A Lot Help needed standing up from a chair using your arms (e.g., wheelchair or bedside chair)?: A Lot Help needed to walk in hospital room?: A Lot Help needed climbing 3-5 steps with a railing? : Total 6 Click Score: 11    End of Session Equipment Utilized During Treatment: Oxygen;Gait belt Activity Tolerance: Patient tolerated treatment well Patient left: with call bell/phone within reach;in chair;with chair alarm set;with family/visitor present Nurse Communication: Mobility status PT Visit Diagnosis: Unsteadiness on feet (R26.81);Difficulty in walking, not elsewhere classified (R26.2);Pain Pain - Right/Left: Left Pain - part of body: Hip     Time: 1136-1202 PT Time Calculation (min) (ACUTE ONLY): 26 min  Charges:  $Gait Training: 8-22 mins $Therapeutic Exercise: 8-22 mins                     Abran Richard, PT Acute Rehab Services Pager [phone] [facility] 01/11/2020, 1:16 PM

## 2020-01-11 NOTE — Progress Notes (Signed)
    [facility] for Infectious Disease    Date of Admission:  01/04/2020   Total days of antibiotics 4           ID: Olivia Hooper is a 79 y.o. female with  Enterobacter pneumonia and secondary bacteremia Principal Problem:   Closed left hip fracture, initial encounter ([place]) Active Problems:   COPD GOLD II / still smoking    Rheumatoid arthritis ([place])   Pulmonary mycobacterial infection (HCC)   Anemia   Protein-calorie malnutrition, severe    Subjective: Some soreness with staying in bed, still has some productive cough. Afebrile. Her son is at her bedside  Medications:  . ascorbic acid  500 mg Oral BID  . Chlorhexidine Gluconate Cloth  6 each Topical Daily  . docusate sodium  100 mg Oral BID  . feeding supplement  237 mL Oral TID BM  . ferrous sulfate  325 mg Oral BID WC  . heparin  5,000 Units Subcutaneous Q8H  . mouth rinse  15 mL Mouth Rinse BID  . mometasone-formoterol  2 puff Inhalation BID  . multivitamin with minerals  1 tablet Oral Daily  . QUEtiapine  25 mg Oral QHS  . senna  1 tablet Oral BID  . sodium chloride flush  10-40 mL Intracatheter Q12H  . sodium chloride flush  10-40 mL Intracatheter Q12H    Objective: Vital signs in last 24 hours: Temp:  [97.7 F (36.5 C)-98.9 F (37.2 C)] 98.9 F (37.2 C) (11/04 1515) Pulse Rate:  [82-90] 90 (11/04 1515) Resp:  [16-18] 18 (11/04 1515) BP: (104-115)/(48-78) 109/57 (11/04 1515) SpO2:  [96 %-100 %] 98 % (11/04 1515) Physical Exam  Constitutional:  oriented to person, place. appears well-developed and well-nourished. No distress.  HENT: [place]/AT, PERRLA, no scleral icterus Mouth/Throat: Oropharynx is clear and moist. No oropharyngeal exudate.  Cardiovascular: Normal rate, regular rhythm and normal heart sounds. Exam reveals no gallop and no friction rub.  No murmur heard.  Pulmonary/Chest: Effort normal and breath sounds normal. No respiratory distress.  Decreased BS at left base Neck = supple, no nuchal  rigidity Abdominal: Soft. Bowel sounds are normal.  exhibits no distension. There is no tenderness.  Lymphadenopathy: no cervical adenopathy. No axillary adenopathy Neurological: alert and oriented to person, place, and time.  Skin: Skin is warm and dry. RA changes to hands Psychiatric: a normal mood and affect.  behavior is normal.   Lab Results Recent Labs    01/10/20 0420 01/11/20 0335  WBC 10.3 8.1  HGB 9.0* 8.7*  HCT 28.2* 27.7*  NA 140 139  K 3.6 3.6  CL 97* 99  CO2 36* 34*  BUN 20 18  CREATININE 0.46 0.53   Liver Panel Recent Labs    01/11/20 0335  PROT 4.6*  ALBUMIN 1.6*  AST 20  ALT 17  ALKPHOS 90  BILITOT 0.7    Microbiology: 11/1 blood cx enterobacter cloacae Studies/Results: No results found.   Assessment/Plan: Hospital onset pneumonia with secondary enterobacter bacteremia = appears steadily improving. Will plan to do 10 day course of IV cefepime starting day 1 as 11/2.  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  [facility] for Infectious Diseases Cell: [phone] Pager: [phone]  01/11/2020, 6:27 PM

## 2020-01-12 ENCOUNTER — Other Ambulatory Visit: Payer: Self-pay | Admitting: *Deleted

## 2020-01-12 DIAGNOSIS — J181 Lobar pneumonia, unspecified organism: Secondary | ICD-10-CM

## 2020-01-12 DIAGNOSIS — Z452 Encounter for adjustment and management of vascular access device: Secondary | ICD-10-CM | POA: Diagnosis not present

## 2020-01-12 DIAGNOSIS — S72145D Nondisplaced intertrochanteric fracture of left femur, subsequent encounter for closed fracture with routine healing: Secondary | ICD-10-CM | POA: Diagnosis not present

## 2020-01-12 DIAGNOSIS — M0689 Other specified rheumatoid arthritis, multiple sites: Secondary | ICD-10-CM | POA: Diagnosis not present

## 2020-01-12 DIAGNOSIS — Z7401 Bed confinement status: Secondary | ICD-10-CM | POA: Diagnosis not present

## 2020-01-12 DIAGNOSIS — S72002S Fracture of unspecified part of neck of left femur, sequela: Secondary | ICD-10-CM | POA: Diagnosis not present

## 2020-01-12 DIAGNOSIS — E440 Moderate protein-calorie malnutrition: Secondary | ICD-10-CM | POA: Diagnosis not present

## 2020-01-12 DIAGNOSIS — Z299 Encounter for prophylactic measures, unspecified: Secondary | ICD-10-CM | POA: Diagnosis not present

## 2020-01-12 DIAGNOSIS — J984 Other disorders of lung: Secondary | ICD-10-CM | POA: Diagnosis not present

## 2020-01-12 DIAGNOSIS — B9689 Other specified bacterial agents as the cause of diseases classified elsewhere: Secondary | ICD-10-CM

## 2020-01-12 DIAGNOSIS — J432 Centrilobular emphysema: Secondary | ICD-10-CM | POA: Diagnosis not present

## 2020-01-12 DIAGNOSIS — S72302A Unspecified fracture of shaft of left femur, initial encounter for closed fracture: Secondary | ICD-10-CM | POA: Diagnosis not present

## 2020-01-12 DIAGNOSIS — D508 Other iron deficiency anemias: Secondary | ICD-10-CM | POA: Diagnosis not present

## 2020-01-12 DIAGNOSIS — J9610 Chronic respiratory failure, unspecified whether with hypoxia or hypercapnia: Secondary | ICD-10-CM | POA: Diagnosis not present

## 2020-01-12 DIAGNOSIS — E213 Hyperparathyroidism, unspecified: Secondary | ICD-10-CM | POA: Diagnosis not present

## 2020-01-12 DIAGNOSIS — J189 Pneumonia, unspecified organism: Secondary | ICD-10-CM | POA: Diagnosis not present

## 2020-01-12 DIAGNOSIS — R262 Difficulty in walking, not elsewhere classified: Secondary | ICD-10-CM | POA: Diagnosis not present

## 2020-01-12 DIAGNOSIS — S79929A Unspecified injury of unspecified thigh, initial encounter: Secondary | ICD-10-CM | POA: Diagnosis not present

## 2020-01-12 DIAGNOSIS — R7881 Bacteremia: Secondary | ICD-10-CM | POA: Diagnosis not present

## 2020-01-12 DIAGNOSIS — L989 Disorder of the skin and subcutaneous tissue, unspecified: Secondary | ICD-10-CM | POA: Diagnosis not present

## 2020-01-12 DIAGNOSIS — S72002A Fracture of unspecified part of neck of left femur, initial encounter for closed fracture: Secondary | ICD-10-CM | POA: Diagnosis not present

## 2020-01-12 DIAGNOSIS — M069 Rheumatoid arthritis, unspecified: Secondary | ICD-10-CM | POA: Diagnosis not present

## 2020-01-12 DIAGNOSIS — W19XXXA Unspecified fall, initial encounter: Secondary | ICD-10-CM | POA: Diagnosis not present

## 2020-01-12 DIAGNOSIS — G9341 Metabolic encephalopathy: Secondary | ICD-10-CM | POA: Diagnosis not present

## 2020-01-12 DIAGNOSIS — M6281 Muscle weakness (generalized): Secondary | ICD-10-CM | POA: Diagnosis not present

## 2020-01-12 DIAGNOSIS — D649 Anemia, unspecified: Secondary | ICD-10-CM | POA: Diagnosis not present

## 2020-01-12 DIAGNOSIS — E212 Other hyperparathyroidism: Secondary | ICD-10-CM | POA: Diagnosis not present

## 2020-01-12 DIAGNOSIS — M2550 Pain in unspecified joint: Secondary | ICD-10-CM | POA: Diagnosis not present

## 2020-01-12 DIAGNOSIS — M810 Age-related osteoporosis without current pathological fracture: Secondary | ICD-10-CM | POA: Diagnosis not present

## 2020-01-12 DIAGNOSIS — Z853 Personal history of malignant neoplasm of breast: Secondary | ICD-10-CM | POA: Diagnosis not present

## 2020-01-12 DIAGNOSIS — R2681 Unsteadiness on feet: Secondary | ICD-10-CM | POA: Diagnosis not present

## 2020-01-12 DIAGNOSIS — J9611 Chronic respiratory failure with hypoxia: Secondary | ICD-10-CM | POA: Diagnosis not present

## 2020-01-12 DIAGNOSIS — K5901 Slow transit constipation: Secondary | ICD-10-CM | POA: Diagnosis not present

## 2020-01-12 DIAGNOSIS — G8918 Other acute postprocedural pain: Secondary | ICD-10-CM | POA: Diagnosis not present

## 2020-01-12 DIAGNOSIS — S72302D Unspecified fracture of shaft of left femur, subsequent encounter for closed fracture with routine healing: Secondary | ICD-10-CM | POA: Diagnosis not present

## 2020-01-12 DIAGNOSIS — R1310 Dysphagia, unspecified: Secondary | ICD-10-CM | POA: Diagnosis not present

## 2020-01-12 DIAGNOSIS — R41841 Cognitive communication deficit: Secondary | ICD-10-CM | POA: Diagnosis not present

## 2020-01-12 DIAGNOSIS — J9601 Acute respiratory failure with hypoxia: Secondary | ICD-10-CM

## 2020-01-12 DIAGNOSIS — I959 Hypotension, unspecified: Secondary | ICD-10-CM | POA: Diagnosis not present

## 2020-01-12 DIAGNOSIS — J449 Chronic obstructive pulmonary disease, unspecified: Secondary | ICD-10-CM | POA: Diagnosis not present

## 2020-01-12 DIAGNOSIS — Z8616 Personal history of COVID-19: Secondary | ICD-10-CM | POA: Diagnosis not present

## 2020-01-12 DIAGNOSIS — A310 Pulmonary mycobacterial infection: Secondary | ICD-10-CM | POA: Diagnosis not present

## 2020-01-12 DIAGNOSIS — A319 Mycobacterial infection, unspecified: Secondary | ICD-10-CM | POA: Diagnosis not present

## 2020-01-12 DIAGNOSIS — G8929 Other chronic pain: Secondary | ICD-10-CM | POA: Diagnosis not present

## 2020-01-12 DIAGNOSIS — E782 Mixed hyperlipidemia: Secondary | ICD-10-CM | POA: Diagnosis not present

## 2020-01-12 DIAGNOSIS — R6510 Systemic inflammatory response syndrome (SIRS) of non-infectious origin without acute organ dysfunction: Secondary | ICD-10-CM | POA: Diagnosis not present

## 2020-01-12 DIAGNOSIS — I10 Essential (primary) hypertension: Secondary | ICD-10-CM | POA: Diagnosis not present

## 2020-01-12 DIAGNOSIS — Z4789 Encounter for other orthopedic aftercare: Secondary | ICD-10-CM | POA: Diagnosis not present

## 2020-01-12 LAB — COMPREHENSIVE METABOLIC PANEL
ALT: 17 U/L (ref 0–44)
AST: 15 U/L (ref 15–41)
Albumin: 1.6 g/dL — ABNORMAL LOW (ref 3.5–5.0)
Alkaline Phosphatase: 89 U/L (ref 38–126)
Anion gap: 5 (ref 5–15)
BUN: 16 mg/dL (ref 8–23)
CO2: 34 mmol/L — ABNORMAL HIGH (ref 22–32)
Calcium: 8.9 mg/dL (ref 8.9–10.3)
Chloride: 98 mmol/L (ref 98–111)
Creatinine, Ser: 0.63 mg/dL (ref 0.44–1.00)
GFR, Estimated: >60 mL/min (ref >60)
Glucose, Bld: 81 mg/dL (ref 70–99)
Potassium: 3.8 mmol/L (ref 3.5–5.1)
Sodium: 137 mmol/L (ref 135–145)
Total Bilirubin: 0.5 mg/dL (ref 0.3–1.2)
Total Protein: 4.9 g/dL — ABNORMAL LOW (ref 6.5–8.1)

## 2020-01-12 LAB — CBC
HCT: 28 % — ABNORMAL LOW (ref 36.0–46.0)
Hemoglobin: 8.6 g/dL — ABNORMAL LOW (ref 12.0–15.0)
MCH: 29.8 pg (ref 26.0–34.0)
MCHC: 30.7 g/dL (ref 30.0–36.0)
MCV: 96.9 fL (ref 80.0–100.0)
Platelets: 269 10*3/uL (ref 150–400)
RBC: 2.89 MIL/uL — ABNORMAL LOW (ref 3.87–5.11)
RDW: 14.8 % (ref 11.5–15.5)
WBC: 10.5 10*3/uL (ref 4.0–10.5)
nRBC: 0 % (ref 0.0–0.2)

## 2020-01-12 LAB — PROCALCITONIN: Procalcitonin: 2.94 ng/mL

## 2020-01-12 MED ORDER — CEFEPIME HCL 2 G IJ SOLR
2.0000 g | Freq: Two times a day (BID) | INTRAMUSCULAR | Status: DC
Start: 2020-01-12 — End: 2020-01-12

## 2020-01-12 MED ORDER — DOCUSATE SODIUM 100 MG PO CAPS: 100.0000 mg | ORAL_CAPSULE | Freq: Two times a day (BID) | ORAL | 0 refills | Status: AC

## 2020-01-12 MED ORDER — HYDROCODONE-ACETAMINOPHEN 5-325 MG PO TABS
1.0000 | ORAL_TABLET | ORAL | 0 refills | Status: DC | PRN
Start: 2020-01-12 — End: 2020-04-16

## 2020-01-12 MED ORDER — CEFEPIME IV (FOR PTA / DISCHARGE USE ONLY): 2.0000 g | Freq: Two times a day (BID) | INTRAVENOUS | 0 refills | Status: AC

## 2020-01-12 MED ORDER — FERROUS SULFATE 325 (65 FE) MG PO TABS: 325.0000 mg | ORAL_TABLET | Freq: Two times a day (BID) | ORAL | 0 refills | Status: AC

## 2020-01-12 MED ORDER — ENOXAPARIN SODIUM 30 MG/0.3ML ~~LOC~~ SOLN: 30.0000 mg | SUBCUTANEOUS | 0 refills | Status: DC

## 2020-01-12 NOTE — Progress Notes (Addendum)
Patient being discharged to [facility] via [facility]. Pt is being discharged with Right Chest Port accessed for antibiotic therapy. IV team informed RN to flush port with normal saline before being discharged. Discharge instructions and medication education provided to pt and placed in packet for receiving facility. RN called report to [facility] for pt and was hung up on 4 separate times. [facility] RN informed that they will call back for report once able.

## 2020-01-12 NOTE — TOC Transition Note (Signed)
Transition of Care [facility]) - CM/SW Discharge Note   Patient Details  Name: Sonja Manseau MRN: [id number] Date of Birth: [birth year]  [facility] CM/SW Contact:  Trish Mage, LCSW Phone Number: 01/12/2020, 1:35 PM   Clinical Narrative:   Patient who is scheduled for d/c today will transition to [facility].  Daughter alerted. PTAR arranged. Nursing, please call report to [phone], room 103P.  TOC sign off.    Final next level of care: Skilled Nursing Facility Barriers to Discharge: Barriers Resolved   Patient Goals and CMS Choice     Choice offered to / list presented to : Patient  Discharge Placement                       Discharge Plan and Services   Discharge Planning Services: CM Consult Post Acute Care Choice: [place]                               Social Determinants of Health (SDOH) Interventions     Readmission Risk Interventions No flowsheet data found.

## 2020-01-12 NOTE — Progress Notes (Signed)
PHARMACY CONSULT NOTE FOR:  OUTPATIENT  PARENTERAL ANTIBIOTIC THERAPY (OPAT)  Indication: Enterobacter bacteremia Regimen: Cefepime 2 gm IV q 12 hours End date: 01/18/20  IV antibiotic discharge orders are pended. To discharging provider:  please sign these orders via discharge navigator,  Select New Orders & click on the button choice - Manage This Unsigned Work.     Thank you for allowing pharmacy to be a part of this patient's care.  Jimmy Footman, PharmD, BCPS, [place] Infectious Diseases Clinical Pharmacist Phone: [phone] 01/12/2020, 12:26 PM

## 2020-01-12 NOTE — Discharge Summary (Addendum)
Physician Discharge Summary  Olivia Hooper JGO:[id number] DOB: [birth year] DOA: 01/04/2020  PCP: [name], MD  Admit date: 01/04/2020 Discharge date: 01/12/2020  Admitted From: Home Disposition:  SNF  Recommendations for Outpatient Follow-up:  1. Follow up with PCP in 1-2 weeks 2. Follow up with Orthopedic Surgery as scheduled 3. Follow up with ID as scheduled 4. Please complete IV cefepime via port through 11/12 5. Routine port maintenance. Please de-access port after completion of IV abx  Discharge Condition:Stable CODE STATUS:Full Diet recommendation: Regular   Brief/Interim Summary: 79 year old female with past medical history of chronic hypoxic respiratory failure on 2 L at baseline, COPD, rheumatoid arthritis, left breast cancer status post mastectomy, osteoporosis, hypertension, hyperlipidemia, Mycobacterium abscessus lung infection s/p treatment who presented to the ED with chief complaint of fall. She went to walk outside and fell down approximately 4 steps onto her left hip onto the concrete. Believes that she tripped over some carpeting which made her fall. No loss of consciousness, did not hit her head, not on blood thinners. No prodromal symptoms.  Discharge Diagnoses:  Principal Problem:   Closed left hip fracture, initial encounter [facility]) Active Problems:   COPD GOLD II / still smoking    Rheumatoid arthritis ([place])   Pulmonary mycobacterial infection ([place])   Anemia   Protein-calorie malnutrition, severe  Left hip fracture status post intramedullary fixation of left femur on 01/05/2020 by Dr. Lyla Glassing Hgb thus far stable No evidence of acute blood loss PT OT recs for SNF Fall precautions Will plan for SNF at time of d/c  Sepsis secondary toCAP, suspect POA,gram-negative rods bacteremia, POA History of Mycobacterium abscesses, follows with Dr. Wendie Agreste at [place] Meets SIRS criteria: Heart rate 110, RR 22, WBC 15.7K Received 1 dose of Zosyn on  01/08/2020 for possible Mycobacterium abscesses exacerbation which was discontinuedperID recommendation On 01/09/2020 blood cultures drawn on 01/08/2020 + for gram-negative rods 1 out of 2 bottles, seen in aerobic and anaerobic bottles. Started Cefepime on 01/09/20 Leukocytosis is downtrending Presently afebrile ID had been following.Pt is currently continued on cefepime. Enterobacter noted to be resistant to cefazolin, otherwise pan-sensitive ID has recommended continuing IV cefepime via port through 55/97  Acute metabolic encephalopathy likely 2/2 to acute illness Cont to treat underlying infection Much improved  Acute on chronic hypoxic hypercarbic respiratory failure initially requiring BIPAP, improving, likely 2/2 to atelectasis with underlying COPD and Mycobacterium abscesses On 2 L oxygen continuously by nasal cannula at baseline Became acutely hypoxic and hypercarbic after surgeryin PACU Now much improved Pulmonary was later consulted who recommended outpatientwork-up, PFT outpatient.  Recommend follow-up with pulmonary outpatient.  Iron deficiency anemia of unclear etiology Iron studies consistent with severe iron deficiency Baseline hemoglobin appears to be 9.7 with MCV of 89 Retic count of 1.9 Continue ferrous sulfate 325 mg twice daily Pt was transfused 2 unit PRBC perioperatively  Acute blood loss anemia post orthopedic surgery Management as stated above Hemoglobin is uptrending No evidence of acute blood loss at this time  History ofMycobacterium abscessus Follows with ID, Dr. Cristela Blue  COPD C/w home bronchodilators Continue continuous oxygen supplementation as needed  Severe protein calorie malnutrition BMI 15  Albumin 2.3 Continue to encourage PO intake as tolerated Dietitian was consulted  Ambulatory dysfunction post orthopedic surgery PT assessment recommended SNF with 24-hour supervision/assistance Continue PT OT with assistance and fall  precautions Plan for SNF placement  Discharge Instructions  Discharge Instructions    Home infusion instructions   Complete by: As directed  Instructions: Flushing of vascular access device: 0.9% NaCl pre/post medication administration and prn patency; Heparin 100 u/ml, 22ml for implanted ports and Heparin 10u/ml, 8ml for all other central venous catheters.     Allergies as of 01/12/2020      Reactions   Buprenorphine Hcl Nausea And Vomiting   Morphine And Related Nausea And Vomiting         Medication List    STOP taking these medications   AMBULATORY NON FORMULARY MEDICATION   cefOXitin 8 g in dextrose 5 % 50 mL   diphenoxylate-atropine 2.5-0.025 MG/5ML liquid Commonly known as: LOMOTIL   fluconazole 100 MG tablet Commonly known as: DIFLUCAN   lidocaine-prilocaine cream Commonly known as: EMLA   linezolid 600 MG tablet Commonly known as: ZYVOX   mupirocin ointment 2 % Commonly known as: BACTROBAN   predniSONE 5 MG tablet Commonly known as: DELTASONE   traMADol 50 MG tablet Commonly known as: ULTRAM   zinc sulfate 220 (50 Zn) MG capsule     TAKE these medications   acetaminophen 500 MG tablet Commonly known as: TYLENOL Take 1,000 mg by mouth every 8 (eight) hours as needed for mild pain.   albuterol 108 (90 Base) MCG/ACT inhaler Commonly known as: VENTOLIN HFA Inhale 2 puffs into the lungs every 6 (six) hours as needed for wheezing or shortness of breath.   ascorbic acid 500 MG tablet Commonly known as: VITAMIN C Take 1 tablet (500 mg total) by mouth 2 (two) times daily.   ceFEPime  IVPB Commonly known as: MAXIPIME Inject 2 g into the vein every 12 (twelve) hours for 6 days. Indication:  Enterobacter bacteremia First Dose: Yes Last Day of Therapy:  01/18/20 Labs - Once weekly:  CBC/D and BMP, Labs - Every other week:  ESR and CRP Method of administration: IV Push Method of administration may be changed at the discretion of home infusion  pharmacist based upon assessment of the patient and/or caregiver's ability to self-administer the medication ordered.   docusate sodium 100 MG capsule Commonly known as: COLACE Take 1 capsule (100 mg total) by mouth 2 (two) times daily.   Dulera 100-5 MCG/ACT Aero Generic drug: mometasone-formoterol Inhale 2 puffs into the lungs 2 (two) times daily as needed for wheezing or shortness of breath.   enoxaparin 30 MG/0.3ML injection Commonly known as: LOVENOX Inject 0.3 mLs (30 mg total) into the skin daily.   ferrous sulfate 325 (65 FE) MG tablet Take 1 tablet (325 mg total) by mouth 2 (two) times daily with a meal.   guaiFENesin-dextromethorphan 100-10 MG/5ML syrup Commonly known as: ROBITUSSIN DM Take 5 mLs by mouth every 4 (four) hours as needed for cough.   HYDROcodone-acetaminophen 5-325 MG tablet Commonly known as: NORCO/VICODIN Take 1-2 tablets by mouth every 4 (four) hours as needed for moderate pain (pain score 4-6).   Ipratropium-Albuterol 20-100 MCG/ACT Aers respimat Commonly known as: COMBIVENT Inhale 2 puffs into the lungs every 6 (six) hours.   PROBIOTIC ACIDOPHILUS PO Take 1 tablet by mouth daily.   zinc gluconate 50 MG tablet Take 50 mg by mouth daily.            Home Infusion Instuctions  (From admission, onward)         Start     Ordered   01/12/20 0000  Home infusion instructions       Question:  Instructions  Answer:  Flushing of vascular access device: 0.9% NaCl pre/post medication administration and prn patency; Heparin 100 u/ml,  49ml for implanted ports and Heparin 10u/ml, 65ml for all other central venous catheters.   01/12/20 1228          Contact information for follow-up providers    Swinteck, Aaron Edelman, MD. Schedule an appointment as soon as possible for a visit in 2 weeks.   Specialty: Orthopedic Surgery Why: For wound re-check, For suture removal Contact information: [address] [place] [address] [phone]         Chesley Mires, MD Follow up on 02/07/2020.   Specialty: Pulmonary Disease Why: Appt at 11 AM.  Please arrive at 10:45 am for check in.  Contact information: [address] [place] [address] [phone]            Contact information for after-discharge care    Destination    [facility] .   Service: Skilled Nursing Contact information: [place] [address] [phone]                 Allergies  Allergen Reactions  . Buprenorphine Hcl Nausea And Vomiting  . Morphine And Related Nausea And Vomiting         Consultations:  Orthopedic Surgery  ID  Procedures/Studies: Pelvis Portable  Result Date: 01/05/2020 CLINICAL DATA:  ORIF left femur fracture EXAM: PORTABLE PELVIS 1-2 VIEWS COMPARISON:  01/04/2020 FINDINGS: Interval ORIF left subtrochanteric fracture. Hardware in satisfactory position. Compression screw and intramedullary rod have been placed. Left hip joint normal. No other pelvic fracture. IMPRESSION: ORIF subtrochanteric fracture left femur Electronically Signed   By: Franchot Gallo M.D.   On: 01/05/2020 14:55   DG Pelvis Portable  Result Date: 01/04/2020 CLINICAL DATA:  Possible left hip fracture. Fall down 4 stairs at home. Hip pain. EXAM: PORTABLE PELVIS 1-2 VIEWS COMPARISON:  None. FINDINGS: Acute oblique subtrochanteric fracture of the left femur with extension to involve the greater and lesser trochanters. There is foreshortening and approximately 1 shaft with medial displacement. The hips appear located on the single radiograph. Mild bilateral hip degenerative change. Osteopenia. Lower lumbar degenerative change. IMPRESSION: Acute oblique subtrochanteric fracture of the left femur with extension to involve the greater and lesser trochanters. There is foreshortening and approximately 1 shaft with medial displacement. Electronically Signed   By: Margaretha Sheffield MD   On: 01/04/2020 13:10    DG CHEST PORT 1 VIEW  Result Date: 01/08/2020 CLINICAL DATA:  Acute respiratory failure with hypoxia. EXAM: PORTABLE CHEST 1 VIEW COMPARISON:  Radiograph 01/05/2020.  CT 09/14/2019 FINDINGS: Patient is rotated. Right chest port remains in place. Cavitary process at the right lung apex was better delineated on prior CT, grossly stable in radiographic appearance. Increasing volume loss in the left hemithorax as well as consolidation at the left lung base. There may be a left pleural effusion. Possible small right pleural effusion. Underlying emphysema and bronchial thickening. The heart is normal in size. Aortic atherosclerosis. No pneumothorax. Bones are under mineralized. IMPRESSION: 1. Increasing volume loss in the left hemithorax with consolidation at the left lung base. Findings may be related to airspace consolidation such as pneumonia or atelectasis related to mucous plugging. 2. Possible pleural effusions. 3. Cavitary process at the right lung apex was better delineated on prior CT, grossly stable in radiographic appearance. Electronically Signed   By: Keith Rake M.D.   On: 01/08/2020 19:28   DG CHEST PORT 1 VIEW  Result Date: 01/05/2020 CLINICAL DATA:  Hypoxia. EXAM: PORTABLE CHEST 1 VIEW COMPARISON:  CT chest 09/14/2019.  Chest radiograph May 17, 2019. FINDINGS: Right Port-A-Cath with the tip projecting at the right atrium. Similar cardiomediastinal silhouette. Aortic atherosclerosis. Small left greater than right pleural effusions. No discernible pneumothorax. Overlying left basilar opacities. Cavitary right upper lobe mass, better characterized on prior CT chest. IMPRESSION: 1. Small left greater than right pleural effusions. Overlying left basilar opacities may represent atelectasis, aspiration, and/or pneumonia. 2. Cavitary right upper lobe mass, better characterized on prior CT chest. Electronically Signed   By: Margaretha Sheffield MD   On: 01/05/2020 15:52   DG C-Arm 1-60 Min-No  Report  Result Date: 01/05/2020 Fluoroscopy was utilized by the requesting physician.  No radiographic interpretation.   DG HIP OPERATIVE UNILAT W OR W/O PELVIS LEFT  Result Date: 01/05/2020 CLINICAL DATA:  Open reduction internal fixation for fracture EXAM: OPERATIVE LEFT HIP  2 VIEWS TECHNIQUE: Fluoroscopic spot image(s) were submitted for interpretation post-operatively. COMPARISON:  January 04, 2020 FLUOROSCOPY TIME:  1 minutes 53 seconds; 4 acquired images FINDINGS: Frontal and lateral views obtained. There is screw and rod fixation through a comminuted fracture of intertrochanteric femur region. Alignment is near anatomic following screw and rod fixation. Tip of the screws in the proximal femoral head. No new fracture. No dislocation evident. No appreciable joint space narrowing. IMPRESSION: Screw and rod fixation through to comminuted fracture of intertrochanteric femur region with alignment near anatomic after surgical fixation. No new fracture. No dislocation. Electronically Signed   By: Lowella Grip III M.D.   On: 01/05/2020 14:15   DG FEMUR MIN 2 VIEWS LEFT  Result Date: 01/04/2020 CLINICAL DATA:  79 year old female status post fall and left lower extremity pain. EXAM: LEFT FEMUR 2 VIEWS COMPARISON:  Pelvic radiograph dated 01/04/2020. FINDINGS: Displaced oblique fracture of the proximal femur as described on the pelvic radiograph. No other fracture identified involving the more distal portion of the femur. The bones are osteopenic. There is no dislocation. The soft tissues are unremarkable. IMPRESSION: Displaced oblique fracture of the proximal femur. No dislocation. Electronically Signed   By: Anner Crete M.D.   On: 01/04/2020 16:22   DG FEMUR PORT MIN 2 VIEWS LEFT  Result Date: 01/05/2020 CLINICAL DATA:  Left hip fracture EXAM: LEFT FEMUR PORTABLE 2 VIEWS COMPARISON:  01/04/2020 FINDINGS: Subtrochanteric fracture left femur has been fixed with a screw and locking  intramedullary rod extending into the distal femur. Hardware in satisfactory position. Proximal fracture fragment projects anteriorly. Left hip joint normal. IMPRESSION: Satisfactory ORIF  subtrochanteric fracture left femur. Electronically Signed   By: Franchot Gallo M.D.   On: 01/05/2020 14:54    Subjective: Without complaints  Discharge Exam: Vitals:   01/12/20 0426 01/12/20 0742  BP: (!) 121/57   Pulse: 91 85  Resp: 20 16  Temp: 98.9 F (37.2 C)   SpO2: 98%    Vitals:   01/11/20 2046 01/11/20 2134 01/12/20 0426 01/12/20 0742  BP:  (!) 104/53 (!) 121/57   Pulse:  90 91 85  Resp:  $Remo'20 20 16  'SHJGV$ Temp:  98.8 F (37.1 C) 98.9 F (37.2 C)   TempSrc:  Oral Oral   SpO2: (!) 80% 96% 98%   Weight:      Height:        General: Pt is alert, awake, not in acute distress Cardiovascular: RRR, S1/S2 +, no rubs, no gallops Respiratory: CTA bilaterally, no wheezing, no rhonchi Abdominal: Soft, NT, ND, bowel sounds + Extremities: no edema, no cyanosis   The results of significant  diagnostics from this hospitalization (including imaging, microbiology, ancillary and laboratory) are listed below for reference.     Microbiology: Recent Results (from the past 240 hour(s))  Respiratory Panel by RT PCR (Flu A&B, Covid) - Nasopharyngeal Swab     Status: None   Collection Time: 01/04/20  1:46 PM   Specimen: Nasopharyngeal Swab  Result Value Ref Range Status   SARS Coronavirus 2 by RT PCR NEGATIVE NEGATIVE Final    Comment: (NOTE) SARS-CoV-2 target nucleic acids are NOT DETECTED.  The SARS-CoV-2 RNA is generally detectable in upper respiratoy specimens during the acute phase of infection. The lowest concentration of SARS-CoV-2 viral copies this assay can detect is 131 copies/mL. A negative result does not preclude SARS-Cov-2 infection and should not be used as the sole basis for treatment or other patient management decisions. A negative result may occur with  improper specimen  collection/handling, submission of specimen other than nasopharyngeal swab, presence of viral mutation(s) within the areas targeted by this assay, and inadequate number of viral copies (<131 copies/mL). A negative result must be combined with clinical observations, patient history, and epidemiological information. The expected result is Negative.  Fact Sheet for Patients:  PinkCheek.be  Fact Sheet for Healthcare Providers:  GravelBags.it  This test is no t yet approved or cleared by the [place] FDA and  has been authorized for detection and/or diagnosis of SARS-CoV-2 by FDA under an Emergency Use Authorization (EUA). This EUA will remain  in effect (meaning this test can be used) for the duration of the COVID-19 declaration under Section 564(b)(1) of the Act, 21 U.S.C. section 360bbb-3(b)(1), unless the authorization is terminated or revoked sooner.     Influenza A by PCR NEGATIVE NEGATIVE Final   Influenza B by PCR NEGATIVE NEGATIVE Final    Comment: (NOTE) The Xpert Xpress SARS-CoV-2/FLU/RSV assay is intended as an aid in  the diagnosis of influenza from Nasopharyngeal swab specimens and  should not be used as a sole basis for treatment. Nasal washings and  aspirates are unacceptable for Xpert Xpress SARS-CoV-2/FLU/RSV  testing.  Fact Sheet for Patients: PinkCheek.be  Fact Sheet for Healthcare Providers: GravelBags.it  This test is not yet approved or cleared by the [place] FDA and  has been authorized for detection and/or diagnosis of SARS-CoV-2 by  FDA under an Emergency Use Authorization (EUA). This EUA will remain  in effect (meaning this test can be used) for the duration of the  Covid-19 declaration under Section 564(b)(1) of the Act, 21  U.S.C. section 360bbb-3(b)(1), unless the authorization is  terminated or revoked. Performed at [facility], [place] [address], [place], [place] [address]   Surgical PCR screen     Status: None   Collection Time: 01/04/20  8:49 PM   Specimen: Nasal Mucosa; Nasal Swab  Result Value Ref Range Status   MRSA, PCR NEGATIVE NEGATIVE Final   Staphylococcus aureus NEGATIVE NEGATIVE Final    Comment: (NOTE) The Xpert SA Assay (FDA approved for NASAL specimens in patients 57 years of age and older), is one component of a comprehensive surveillance program. It is not intended to diagnose infection nor to guide or monitor treatment. Performed at [facility], [place] [address], [place], [place] [address]   Culture, blood (routine x 2)     Status: Abnormal   Collection Time: 01/08/20 11:16 AM   Specimen: BLOOD  Result Value Ref Range Status   Specimen Description   Final    BLOOD RIGHT  ANTECUBITAL Performed at [facility], [place] [address] [place], [place] [address]    Special Requests   Final    BOTTLES DRAWN AEROBIC AND ANAEROBIC Blood Culture results may not be optimal due to an inadequate volume of blood received in culture bottles Performed at [place] [address] [place], [place] [address]    Culture  Setup Time   Final    GRAM NEGATIVE RODS IN BOTH AEROBIC AND ANAEROBIC BOTTLES CRITICAL VALUE NOTED.  VALUE IS CONSISTENT WITH PREVIOUSLY REPORTED AND CALLED VALUE.    Culture (A)  Final    ENTEROBACTER CLOACAE SUSCEPTIBILITIES PERFORMED ON PREVIOUS CULTURE WITHIN THE LAST 5 DAYS. Performed at [facility], [place] [address] [place], [place] [address]    Report Status 01/11/2020 FINAL  Final  Culture, blood (routine x 2)     Status: Abnormal   Collection Time: 01/08/20 11:16 AM   Specimen: BLOOD RIGHT HAND  Result Value Ref Range Status   Specimen Description   Final    BLOOD RIGHT HAND Performed at [place] [address], [place], [place] [address]    Special Requests    Final    BOTTLES DRAWN AEROBIC AND ANAEROBIC Blood Culture adequate volume Performed at [place] [address], [place], [place] [address]    Culture  Setup Time   Final    GRAM NEGATIVE RODS IN BOTH AEROBIC AND ANAEROBIC BOTTLES CRITICAL RESULT CALLED TO, READ BACK BY AND VERIFIED WITH: PHARMD D LOFFORD [id number] AT 725 AM BY CM Performed at [facility], [place] [address], [place], [place] [address]    Culture ENTEROBACTER CLOACAE (A)  Final   Report Status 01/11/2020 FINAL  Final   Organism ID, Bacteria ENTEROBACTER CLOACAE  Final      Susceptibility   Enterobacter cloacae - MIC*    CEFAZOLIN >=64 RESISTANT Resistant     CEFEPIME <=0.12 SENSITIVE Sensitive     CEFTAZIDIME 4 SENSITIVE Sensitive     CIPROFLOXACIN <=0.25 SENSITIVE Sensitive     GENTAMICIN <=1 SENSITIVE Sensitive     IMIPENEM 0.5 SENSITIVE Sensitive     TRIMETH/SULFA <=20 SENSITIVE Sensitive     PIP/TAZO 16 SENSITIVE Sensitive     * ENTEROBACTER CLOACAE  Blood Culture ID Panel (Reflexed)     Status: Abnormal (Preliminary result)   Collection Time: 01/08/20 11:16 AM  Result Value Ref Range Status   Enterococcus faecalis NOT DETECTED NOT DETECTED Final   Enterococcus Faecium NOT DETECTED NOT DETECTED Final   Listeria monocytogenes NOT DETECTED NOT DETECTED Final   Staphylococcus species NOT DETECTED NOT DETECTED Final   Staphylococcus aureus (BCID) NOT DETECTED NOT DETECTED Final   Staphylococcus epidermidis NOT DETECTED NOT DETECTED Final   Staphylococcus lugdunensis NOT DETECTED NOT DETECTED Final   Streptococcus species NOT DETECTED NOT DETECTED Final   Streptococcus agalactiae NOT DETECTED NOT DETECTED Final   Streptococcus pneumoniae NOT DETECTED NOT DETECTED Final   Streptococcus pyogenes NOT DETECTED NOT DETECTED Final   A.calcoaceticus-baumannii NOT DETECTED NOT DETECTED Final   Bacteroides fragilis NOT DETECTED NOT DETECTED Final   Enterobacterales PENDING NOT DETECTED Incomplete    Enterobacter cloacae complex DETECTED (A) NOT DETECTED Final    Comment: CRITICAL RESULT CALLED TO, READ BACK BY AND VERIFIED WITH: PHARMD D LOFFORD [id number] AT 7025 AM BY CM    Escherichia coli NOT DETECTED NOT DETECTED Final   Klebsiella aerogenes NOT DETECTED NOT DETECTED Final   Klebsiella  oxytoca NOT DETECTED NOT DETECTED Final   Klebsiella pneumoniae NOT DETECTED NOT DETECTED Final   Proteus species NOT DETECTED NOT DETECTED Final   Salmonella species NOT DETECTED NOT DETECTED Final   Serratia marcescens NOT DETECTED NOT DETECTED Final   Haemophilus influenzae NOT DETECTED NOT DETECTED Final   Neisseria meningitidis NOT DETECTED NOT DETECTED Final   Pseudomonas aeruginosa NOT DETECTED NOT DETECTED Final   Stenotrophomonas maltophilia NOT DETECTED NOT DETECTED Final   Candida albicans NOT DETECTED NOT DETECTED Final   Candida auris NOT DETECTED NOT DETECTED Final   Candida glabrata NOT DETECTED NOT DETECTED Final   Candida krusei NOT DETECTED NOT DETECTED Final   Candida parapsilosis NOT DETECTED NOT DETECTED Final   Candida tropicalis NOT DETECTED NOT DETECTED Final   Cryptococcus neoformans/gattii NOT DETECTED NOT DETECTED Final   CTX-M ESBL NOT DETECTED NOT DETECTED Final   Carbapenem resistance IMP NOT DETECTED NOT DETECTED Final   Carbapenem resistance KPC NOT DETECTED NOT DETECTED Final   Carbapenem resistance NDM NOT DETECTED NOT DETECTED Final   Carbapenem resist OXA 48 LIKE NOT DETECTED NOT DETECTED Final   Carbapenem resistance VIM NOT DETECTED NOT DETECTED Final    Comment: Performed at [facility], [address], [place], [place] [address]     Labs: BNP (last 3 results) Recent Labs    05/17/19 1408  BNP 174.0*   Basic Metabolic Panel: Recent Labs  Lab 01/05/20 2128 01/05/20 2128 01/06/20 0251 01/07/20 0333 01/10/20 0420 01/11/20 0335 01/12/20 0534  NA 136   < > 138 137 140 139 137  K 4.7   < > 4.5 4.0 3.6 3.6 3.8  CL 97*   < > 98 97* 97* 99  98  CO2 29   < > 32 33* 36* 34* 34*  GLUCOSE 339*   < > 154* 148* 99 93 81  BUN 18   < > $R'19 20 20 18 16  'WA$ CREATININE 0.75   < > 0.76 0.62 0.46 0.53 0.63  CALCIUM 9.0   < > 9.4 9.3 10.0 9.4 8.9  MG 1.7  --   --   --   --   --   --   PHOS 3.5  --   --   --   --   --   --    < > = values in this interval not displayed.   Liver Function Tests: Recent Labs  Lab 01/05/20 2128 01/11/20 0335 01/12/20 0534  AST 13* 20 15  ALT $Re'13 17 17  'akJ$ ALKPHOS 66 90 89  BILITOT 0.6 0.7 0.5  PROT 5.2* 4.6* 4.9*  ALBUMIN 2.0* 1.6* 1.6*   No results for input(s): LIPASE, AMYLASE in the last 168 hours. No results for input(s): AMMONIA in the last 168 hours. CBC: Recent Labs  Lab 01/05/20 2128 01/06/20 0251 01/08/20 0537 01/09/20 0436 01/10/20 0420 01/11/20 0335 01/12/20 0534  WBC 13.8*   < > 15.7* 13.2* 10.3 8.1 10.5  NEUTROABS 11.3*  --   --  10.3* 7.6  --   --   HGB 11.4*   < > 8.0* 8.5* 9.0* 8.7* 8.6*  HCT 34.4*   < > 25.1* 26.4* 28.2* 27.7* 28.0*  MCV 91.5   < > 94.7 94.3 93.4 94.2 96.9  PLT 211   < > 148* 150 175 211 269   < > = values in this interval not displayed.   Cardiac Enzymes: No results for input(s): CKTOTAL, CKMB, CKMBINDEX, TROPONINI in the  last 168 hours. BNP: Invalid input(s): POCBNP CBG: No results for input(s): GLUCAP in the last 168 hours. D-Dimer No results for input(s): DDIMER in the last 72 hours. Hgb A1c No results for input(s): HGBA1C in the last 72 hours. Lipid Profile No results for input(s): CHOL, HDL, LDLCALC, TRIG, CHOLHDL, LDLDIRECT in the last 72 hours. Thyroid function studies No results for input(s): TSH, T4TOTAL, T3FREE, THYROIDAB in the last 72 hours.  Invalid input(s): FREET3 Anemia work up No results for input(s): VITAMINB12, FOLATE, FERRITIN, TIBC, IRON, RETICCTPCT in the last 72 hours. Urinalysis    Component Value Date/Time   COLORURINE YELLOW 05/18/2019 1712   APPEARANCEUR CLEAR 05/18/2019 1712   LABSPEC 1.017 05/18/2019 1712   PHURINE  5.0 05/18/2019 1712   GLUCOSEU NEGATIVE 05/18/2019 1712   HGBUR NEGATIVE 05/18/2019 1712   BILIRUBINUR NEGATIVE 05/18/2019 1712   KETONESUR NEGATIVE 05/18/2019 1712   PROTEINUR 30 (A) 05/18/2019 1712   NITRITE NEGATIVE 05/18/2019 1712   LEUKOCYTESUR NEGATIVE 05/18/2019 1712   Sepsis Labs Invalid input(s): PROCALCITONIN,  WBC,  LACTICIDVEN Microbiology Recent Results (from the past 240 hour(s))  Respiratory Panel by RT PCR (Flu A&B, Covid) - Nasopharyngeal Swab     Status: None   Collection Time: 01/04/20  1:46 PM   Specimen: Nasopharyngeal Swab  Result Value Ref Range Status   SARS Coronavirus 2 by RT PCR NEGATIVE NEGATIVE Final    Comment: (NOTE) SARS-CoV-2 target nucleic acids are NOT DETECTED.  The SARS-CoV-2 RNA is generally detectable in upper respiratoy specimens during the acute phase of infection. The lowest concentration of SARS-CoV-2 viral copies this assay can detect is 131 copies/mL. A negative result does not preclude SARS-Cov-2 infection and should not be used as the sole basis for treatment or other patient management decisions. A negative result may occur with  improper specimen collection/handling, submission of specimen other than nasopharyngeal swab, presence of viral mutation(s) within the areas targeted by this assay, and inadequate number of viral copies (<131 copies/mL). A negative result must be combined with clinical observations, patient history, and epidemiological information. The expected result is Negative.  Fact Sheet for Patients:  https://www.moore.com/  Fact Sheet for Healthcare Providers:  https://www.young.biz/  This test is no t yet approved or cleared by the [place] FDA and  has been authorized for detection and/or diagnosis of SARS-CoV-2 by FDA under an Emergency Use Authorization (EUA). This EUA will remain  in effect (meaning this test can be used) for the duration of the COVID-19  declaration under Section 564(b)(1) of the Act, 21 U.S.C. section 360bbb-3(b)(1), unless the authorization is terminated or revoked sooner.     Influenza A by PCR NEGATIVE NEGATIVE Final   Influenza B by PCR NEGATIVE NEGATIVE Final    Comment: (NOTE) The Xpert Xpress SARS-CoV-2/FLU/RSV assay is intended as an aid in  the diagnosis of influenza from Nasopharyngeal swab specimens and  should not be used as a sole basis for treatment. Nasal washings and  aspirates are unacceptable for Xpert Xpress SARS-CoV-2/FLU/RSV  testing.  Fact Sheet for Patients: https://www.moore.com/  Fact Sheet for Healthcare Providers: https://www.young.biz/  This test is not yet approved or cleared by the [place] FDA and  has been authorized for detection and/or diagnosis of SARS-CoV-2 by  FDA under an Emergency Use Authorization (EUA). This EUA will remain  in effect (meaning this test can be used) for the duration of the  Covid-19 declaration under Section 564(b)(1) of the Act, 21  U.S.C. section 360bbb-3(b)(1), unless the authorization  is  terminated or revoked. Performed at [facility], [place] [address] [place], [place] [address]   Surgical PCR screen     Status: None   Collection Time: 01/04/20  8:49 PM   Specimen: Nasal Mucosa; Nasal Swab  Result Value Ref Range Status   MRSA, PCR NEGATIVE NEGATIVE Final   Staphylococcus aureus NEGATIVE NEGATIVE Final    Comment: (NOTE) The Xpert SA Assay (FDA approved for NASAL specimens in patients 56 years of age and older), is one component of a comprehensive surveillance program. It is not intended to diagnose infection nor to guide or monitor treatment. Performed at [facility], [place] [address] [place], [place] [address]   Culture, blood (routine x 2)     Status: Abnormal   Collection Time: 01/08/20 11:16 AM   Specimen: BLOOD  Result Value Ref Range Status   Specimen  Description   Final    BLOOD RIGHT ANTECUBITAL Performed at [place] [address] [place], [place] [address]    Special Requests   Final    BOTTLES DRAWN AEROBIC AND ANAEROBIC Blood Culture results may not be optimal due to an inadequate volume of blood received in culture bottles Performed at [place] [address], [place], [place] [address]    Culture  Setup Time   Final    GRAM NEGATIVE RODS IN BOTH AEROBIC AND ANAEROBIC BOTTLES CRITICAL VALUE NOTED.  VALUE IS CONSISTENT WITH PREVIOUSLY REPORTED AND CALLED VALUE.    Culture (A)  Final    ENTEROBACTER CLOACAE SUSCEPTIBILITIES PERFORMED ON PREVIOUS CULTURE WITHIN THE LAST 5 DAYS. Performed at [facility], [place] [address] [place], [place] [address]    Report Status 01/11/2020 FINAL  Final  Culture, blood (routine x 2)     Status: Abnormal   Collection Time: 01/08/20 11:16 AM   Specimen: BLOOD RIGHT HAND  Result Value Ref Range Status   Specimen Description   Final    BLOOD RIGHT HAND Performed at [place] [address] [place], [place] [address]    Special Requests   Final    BOTTLES DRAWN AEROBIC AND ANAEROBIC Blood Culture adequate volume Performed at [facility] [address] [place], [place] [address]    Culture  Setup Time   Final    GRAM NEGATIVE RODS IN BOTH AEROBIC AND ANAEROBIC BOTTLES CRITICAL RESULT CALLED TO, READ BACK BY AND VERIFIED WITH: PHARMD D LOFFORD [id number] AT 725 AM BY CM Performed at [facility], [place] [address], [place], [place] [address]    Culture ENTEROBACTER CLOACAE (A)  Final   Report Status 01/11/2020 FINAL  Final   Organism ID, Bacteria ENTEROBACTER CLOACAE  Final      Susceptibility   Enterobacter cloacae - MIC*    CEFAZOLIN >=64 RESISTANT Resistant     CEFEPIME <=0.12 SENSITIVE Sensitive     CEFTAZIDIME 4 SENSITIVE Sensitive     CIPROFLOXACIN <=0.25 SENSITIVE Sensitive      GENTAMICIN <=1 SENSITIVE Sensitive     IMIPENEM 0.5 SENSITIVE Sensitive     TRIMETH/SULFA <=20 SENSITIVE Sensitive     PIP/TAZO 16 SENSITIVE Sensitive     * ENTEROBACTER CLOACAE  Blood Culture ID Panel (Reflexed)     Status: Abnormal (Preliminary result)   Collection Time: 01/08/20 11:16 AM  Result Value Ref Range Status   Enterococcus faecalis NOT DETECTED NOT DETECTED Final   Enterococcus Faecium NOT DETECTED NOT DETECTED  Final   Listeria monocytogenes NOT DETECTED NOT DETECTED Final   Staphylococcus species NOT DETECTED NOT DETECTED Final   Staphylococcus aureus (BCID) NOT DETECTED NOT DETECTED Final   Staphylococcus epidermidis NOT DETECTED NOT DETECTED Final   Staphylococcus lugdunensis NOT DETECTED NOT DETECTED Final   Streptococcus species NOT DETECTED NOT DETECTED Final   Streptococcus agalactiae NOT DETECTED NOT DETECTED Final   Streptococcus pneumoniae NOT DETECTED NOT DETECTED Final   Streptococcus pyogenes NOT DETECTED NOT DETECTED Final   A.calcoaceticus-baumannii NOT DETECTED NOT DETECTED Final   Bacteroides fragilis NOT DETECTED NOT DETECTED Final   Enterobacterales PENDING NOT DETECTED Incomplete   Enterobacter cloacae complex DETECTED (A) NOT DETECTED Final    Comment: CRITICAL RESULT CALLED TO, READ BACK BY AND VERIFIED WITH: PHARMD D LOFFORD [id number] AT 7025 AM BY CM    Escherichia coli NOT DETECTED NOT DETECTED Final   Klebsiella aerogenes NOT DETECTED NOT DETECTED Final   Klebsiella oxytoca NOT DETECTED NOT DETECTED Final   Klebsiella pneumoniae NOT DETECTED NOT DETECTED Final   Proteus species NOT DETECTED NOT DETECTED Final   Salmonella species NOT DETECTED NOT DETECTED Final   Serratia marcescens NOT DETECTED NOT DETECTED Final   Haemophilus influenzae NOT DETECTED NOT DETECTED Final   Neisseria meningitidis NOT DETECTED NOT DETECTED Final   Pseudomonas aeruginosa NOT DETECTED NOT DETECTED Final   Stenotrophomonas maltophilia NOT DETECTED NOT DETECTED Final    Candida albicans NOT DETECTED NOT DETECTED Final   Candida auris NOT DETECTED NOT DETECTED Final   Candida glabrata NOT DETECTED NOT DETECTED Final   Candida krusei NOT DETECTED NOT DETECTED Final   Candida parapsilosis NOT DETECTED NOT DETECTED Final   Candida tropicalis NOT DETECTED NOT DETECTED Final   Cryptococcus neoformans/gattii NOT DETECTED NOT DETECTED Final   CTX-M ESBL NOT DETECTED NOT DETECTED Final   Carbapenem resistance IMP NOT DETECTED NOT DETECTED Final   Carbapenem resistance KPC NOT DETECTED NOT DETECTED Final   Carbapenem resistance NDM NOT DETECTED NOT DETECTED Final   Carbapenem resist OXA 48 LIKE NOT DETECTED NOT DETECTED Final   Carbapenem resistance VIM NOT DETECTED NOT DETECTED Final    Comment: Performed at [facility], [address], [place], [place] [address]   Time spent: 30 min  SIGNED:   Marylu Lund, MD  [facility] 01/12/2020, 12:32 PM  If 7PM-7AM, please contact night-coverage

## 2020-01-12 NOTE — Progress Notes (Signed)
Pharmacy Antibiotic Note  Olivia Hooper is a 79 y.o. female with hx mycobacterium abscessus lung infection (f/u with Dr. Lucianne Lei dam at [facility]), presented to the ED on 01/04/20 with c/o left hip pain s/p fall the day before. She was found to have left intertrochanteric femur fracture and underwent intramedullary fixation of left femur on 10/29. BCID + for enterobacter cloacae bacteremia. ID following. Pharmacy consulted to dose cefepime.  Today, 01/12/2020:  WBC WNL  Afebrile  SCr stable, WNL. CrCl = 32 mL/min  PCT continues to trend down (17.8 >> 2.94)  ID planning for 10 day course of cefepime, counting 11/2 as day#1. Today = day #4.  Plan:  Continue cefepime 2gm IV q12h  Renal function has been stable. Pharmacy to sign off, please re-consult if needed.  ______________________________________  Height: 5\' 1"  (154.9 cm) Weight: 36 kg (79 lb 5.9 oz) IBW/kg (Calculated) : 47.8  Temp (24hrs), Avg:98.9 F (37.2 C), Min:98.8 F (37.1 C), Max:98.9 F (37.2 C)  Recent Labs  Lab 01/06/20 0251 01/06/20 0251 01/07/20 0333 01/07/20 0333 01/08/20 0537 01/09/20 0436 01/10/20 0420 01/11/20 0335 01/12/20 0534  WBC 12.7*   < > 11.1*   < > 15.7* 13.2* 10.3 8.1 10.5  CREATININE 0.76  --  0.62  --   --   --  0.46 0.53 0.63   < > = values in this interval not displayed.    Estimated Creatinine Clearance: 31.9 mL/min (by C-G formula based on SCr of 0.63 mg/dL).    Allergies  Allergen Reactions  . Buprenorphine Hcl Nausea And Vomiting  . Morphine And Related Nausea And Vomiting        Antimicrobials this admission:  11/1 zosyn>> 11/1 11/2 cefepime>>  Microbiology results:  11/1 BCx: enterobacter cloacae (R cefazolin) 10/28 MRSA PCR: neg  Thank you for allowing pharmacy to be a part of this patient's care.  Lenis Noon, PharmD 01/12/2020 10:51 AM

## 2020-01-12 NOTE — Patient Outreach (Signed)
[facility]) Care Management  01/12/2020  Ladena Jacquez 08-16-1940 [id number]  Telephone outreach to Mrs. Wettstein who is at [facility]. She fell last week and fractured her left femur. She is pending discharge to [facility] for rehab.  Listened to and supported pt. Advised I will still be following her when she gets home from rehab. Encouraged her to do her best in therapy so she can go home as soon as possible.  [name]. [name], MSN, [facility] Gerontological Nurse Practitioner [facility] Care Management [phone]

## 2020-01-15 DIAGNOSIS — A310 Pulmonary mycobacterial infection: Secondary | ICD-10-CM | POA: Diagnosis not present

## 2020-01-15 DIAGNOSIS — D649 Anemia, unspecified: Secondary | ICD-10-CM | POA: Diagnosis not present

## 2020-01-15 DIAGNOSIS — S72002S Fracture of unspecified part of neck of left femur, sequela: Secondary | ICD-10-CM | POA: Diagnosis not present

## 2020-01-15 DIAGNOSIS — E440 Moderate protein-calorie malnutrition: Secondary | ICD-10-CM | POA: Diagnosis not present

## 2020-01-15 DIAGNOSIS — E782 Mixed hyperlipidemia: Secondary | ICD-10-CM | POA: Diagnosis not present

## 2020-01-15 DIAGNOSIS — Z8616 Personal history of COVID-19: Secondary | ICD-10-CM | POA: Diagnosis not present

## 2020-01-15 DIAGNOSIS — I10 Essential (primary) hypertension: Secondary | ICD-10-CM | POA: Diagnosis not present

## 2020-01-15 DIAGNOSIS — G9341 Metabolic encephalopathy: Secondary | ICD-10-CM | POA: Diagnosis not present

## 2020-01-15 DIAGNOSIS — J9610 Chronic respiratory failure, unspecified whether with hypoxia or hypercapnia: Secondary | ICD-10-CM | POA: Diagnosis not present

## 2020-01-15 DIAGNOSIS — R2681 Unsteadiness on feet: Secondary | ICD-10-CM | POA: Diagnosis not present

## 2020-01-15 DIAGNOSIS — M6281 Muscle weakness (generalized): Secondary | ICD-10-CM | POA: Diagnosis not present

## 2020-01-15 DIAGNOSIS — M069 Rheumatoid arthritis, unspecified: Secondary | ICD-10-CM | POA: Diagnosis not present

## 2020-01-16 ENCOUNTER — Encounter (HOSPITAL_COMMUNITY): Payer: Self-pay | Admitting: Orthopedic Surgery

## 2020-01-16 DIAGNOSIS — E213 Hyperparathyroidism, unspecified: Secondary | ICD-10-CM | POA: Diagnosis not present

## 2020-01-16 DIAGNOSIS — A310 Pulmonary mycobacterial infection: Secondary | ICD-10-CM | POA: Diagnosis not present

## 2020-01-16 DIAGNOSIS — S72302A Unspecified fracture of shaft of left femur, initial encounter for closed fracture: Secondary | ICD-10-CM | POA: Diagnosis not present

## 2020-01-16 DIAGNOSIS — M0689 Other specified rheumatoid arthritis, multiple sites: Secondary | ICD-10-CM | POA: Diagnosis not present

## 2020-01-16 DIAGNOSIS — K5901 Slow transit constipation: Secondary | ICD-10-CM | POA: Diagnosis not present

## 2020-01-16 DIAGNOSIS — D508 Other iron deficiency anemias: Secondary | ICD-10-CM | POA: Diagnosis not present

## 2020-01-16 DIAGNOSIS — M810 Age-related osteoporosis without current pathological fracture: Secondary | ICD-10-CM | POA: Diagnosis not present

## 2020-01-16 DIAGNOSIS — J984 Other disorders of lung: Secondary | ICD-10-CM | POA: Diagnosis not present

## 2020-01-16 DIAGNOSIS — J9611 Chronic respiratory failure with hypoxia: Secondary | ICD-10-CM | POA: Diagnosis not present

## 2020-01-16 DIAGNOSIS — E440 Moderate protein-calorie malnutrition: Secondary | ICD-10-CM | POA: Diagnosis not present

## 2020-01-16 DIAGNOSIS — R6510 Systemic inflammatory response syndrome (SIRS) of non-infectious origin without acute organ dysfunction: Secondary | ICD-10-CM | POA: Diagnosis not present

## 2020-01-16 DIAGNOSIS — R262 Difficulty in walking, not elsewhere classified: Secondary | ICD-10-CM | POA: Diagnosis not present

## 2020-01-17 DIAGNOSIS — E440 Moderate protein-calorie malnutrition: Secondary | ICD-10-CM | POA: Diagnosis not present

## 2020-01-17 DIAGNOSIS — J9610 Chronic respiratory failure, unspecified whether with hypoxia or hypercapnia: Secondary | ICD-10-CM | POA: Diagnosis not present

## 2020-01-17 DIAGNOSIS — J449 Chronic obstructive pulmonary disease, unspecified: Secondary | ICD-10-CM | POA: Diagnosis not present

## 2020-01-17 DIAGNOSIS — D649 Anemia, unspecified: Secondary | ICD-10-CM | POA: Diagnosis not present

## 2020-01-17 DIAGNOSIS — E782 Mixed hyperlipidemia: Secondary | ICD-10-CM | POA: Diagnosis not present

## 2020-01-17 DIAGNOSIS — I10 Essential (primary) hypertension: Secondary | ICD-10-CM | POA: Diagnosis not present

## 2020-01-17 DIAGNOSIS — G9341 Metabolic encephalopathy: Secondary | ICD-10-CM | POA: Diagnosis not present

## 2020-01-17 DIAGNOSIS — S72002S Fracture of unspecified part of neck of left femur, sequela: Secondary | ICD-10-CM | POA: Diagnosis not present

## 2020-01-17 DIAGNOSIS — Z853 Personal history of malignant neoplasm of breast: Secondary | ICD-10-CM | POA: Diagnosis not present

## 2020-01-17 DIAGNOSIS — Z8616 Personal history of COVID-19: Secondary | ICD-10-CM | POA: Diagnosis not present

## 2020-01-17 DIAGNOSIS — M069 Rheumatoid arthritis, unspecified: Secondary | ICD-10-CM | POA: Diagnosis not present

## 2020-01-17 DIAGNOSIS — A310 Pulmonary mycobacterial infection: Secondary | ICD-10-CM | POA: Diagnosis not present

## 2020-01-19 DIAGNOSIS — E782 Mixed hyperlipidemia: Secondary | ICD-10-CM | POA: Diagnosis not present

## 2020-01-19 DIAGNOSIS — M069 Rheumatoid arthritis, unspecified: Secondary | ICD-10-CM | POA: Diagnosis not present

## 2020-01-19 DIAGNOSIS — J449 Chronic obstructive pulmonary disease, unspecified: Secondary | ICD-10-CM | POA: Diagnosis not present

## 2020-01-19 DIAGNOSIS — J9610 Chronic respiratory failure, unspecified whether with hypoxia or hypercapnia: Secondary | ICD-10-CM | POA: Diagnosis not present

## 2020-01-19 DIAGNOSIS — A310 Pulmonary mycobacterial infection: Secondary | ICD-10-CM | POA: Diagnosis not present

## 2020-01-19 DIAGNOSIS — E440 Moderate protein-calorie malnutrition: Secondary | ICD-10-CM | POA: Diagnosis not present

## 2020-01-19 DIAGNOSIS — Z8616 Personal history of COVID-19: Secondary | ICD-10-CM | POA: Diagnosis not present

## 2020-01-19 DIAGNOSIS — D649 Anemia, unspecified: Secondary | ICD-10-CM | POA: Diagnosis not present

## 2020-01-19 DIAGNOSIS — S72002S Fracture of unspecified part of neck of left femur, sequela: Secondary | ICD-10-CM | POA: Diagnosis not present

## 2020-01-19 DIAGNOSIS — G9341 Metabolic encephalopathy: Secondary | ICD-10-CM | POA: Diagnosis not present

## 2020-01-19 DIAGNOSIS — I10 Essential (primary) hypertension: Secondary | ICD-10-CM | POA: Diagnosis not present

## 2020-01-19 DIAGNOSIS — Z853 Personal history of malignant neoplasm of breast: Secondary | ICD-10-CM | POA: Diagnosis not present

## 2020-01-22 ENCOUNTER — Telehealth: Payer: Self-pay

## 2020-01-22 DIAGNOSIS — J9610 Chronic respiratory failure, unspecified whether with hypoxia or hypercapnia: Secondary | ICD-10-CM | POA: Diagnosis not present

## 2020-01-22 DIAGNOSIS — I10 Essential (primary) hypertension: Secondary | ICD-10-CM | POA: Diagnosis not present

## 2020-01-22 DIAGNOSIS — G9341 Metabolic encephalopathy: Secondary | ICD-10-CM | POA: Diagnosis not present

## 2020-01-22 DIAGNOSIS — E440 Moderate protein-calorie malnutrition: Secondary | ICD-10-CM | POA: Diagnosis not present

## 2020-01-22 DIAGNOSIS — E782 Mixed hyperlipidemia: Secondary | ICD-10-CM | POA: Diagnosis not present

## 2020-01-22 DIAGNOSIS — J449 Chronic obstructive pulmonary disease, unspecified: Secondary | ICD-10-CM | POA: Diagnosis not present

## 2020-01-22 DIAGNOSIS — D649 Anemia, unspecified: Secondary | ICD-10-CM | POA: Diagnosis not present

## 2020-01-22 DIAGNOSIS — M069 Rheumatoid arthritis, unspecified: Secondary | ICD-10-CM | POA: Diagnosis not present

## 2020-01-22 DIAGNOSIS — Z8616 Personal history of COVID-19: Secondary | ICD-10-CM | POA: Diagnosis not present

## 2020-01-22 DIAGNOSIS — Z853 Personal history of malignant neoplasm of breast: Secondary | ICD-10-CM | POA: Diagnosis not present

## 2020-01-22 DIAGNOSIS — S72002S Fracture of unspecified part of neck of left femur, sequela: Secondary | ICD-10-CM | POA: Diagnosis not present

## 2020-01-22 DIAGNOSIS — A310 Pulmonary mycobacterial infection: Secondary | ICD-10-CM | POA: Diagnosis not present

## 2020-01-22 NOTE — Telephone Encounter (Signed)
Patient calls regarding appointment on 11/16. States she fell and broke her hip on 01/03/20 and is now in rehab at [facility]. Patient wanting to convert her in person visit scheduled for 01/23/20 to telephone evisit. Appointment notes updated.

## 2020-01-22 NOTE — Telephone Encounter (Signed)
Very good 

## 2020-01-23 ENCOUNTER — Telehealth: Payer: Self-pay

## 2020-01-23 ENCOUNTER — Other Ambulatory Visit: Payer: Self-pay

## 2020-01-23 ENCOUNTER — Other Ambulatory Visit: Payer: Self-pay | Admitting: Infectious Disease

## 2020-01-23 ENCOUNTER — Telehealth (INDEPENDENT_AMBULATORY_CARE_PROVIDER_SITE_OTHER): Payer: Medicare Other | Admitting: Infectious Disease

## 2020-01-23 DIAGNOSIS — J189 Pneumonia, unspecified organism: Secondary | ICD-10-CM

## 2020-01-23 DIAGNOSIS — R7881 Bacteremia: Secondary | ICD-10-CM

## 2020-01-23 DIAGNOSIS — J984 Other disorders of lung: Secondary | ICD-10-CM | POA: Diagnosis not present

## 2020-01-23 DIAGNOSIS — S72002A Fracture of unspecified part of neck of left femur, initial encounter for closed fracture: Secondary | ICD-10-CM

## 2020-01-23 DIAGNOSIS — J181 Lobar pneumonia, unspecified organism: Secondary | ICD-10-CM

## 2020-01-23 DIAGNOSIS — M05731 Rheumatoid arthritis with rheumatoid factor of right wrist without organ or systems involvement: Secondary | ICD-10-CM

## 2020-01-23 DIAGNOSIS — A310 Pulmonary mycobacterial infection: Secondary | ICD-10-CM | POA: Diagnosis not present

## 2020-01-23 DIAGNOSIS — M05732 Rheumatoid arthritis with rheumatoid factor of left wrist without organ or systems involvement: Secondary | ICD-10-CM

## 2020-01-23 DIAGNOSIS — B9689 Other specified bacterial agents as the cause of diseases classified elsewhere: Secondary | ICD-10-CM

## 2020-01-23 NOTE — Progress Notes (Signed)
.  ir

## 2020-01-23 NOTE — Telephone Encounter (Signed)
Received call from [facility]) and family has decided to proceed with Dr. Lucianne Lei Dam's recommendation to remove right Port A Cath. Routing to MD to write orders. Patient currently in a nursing facility.  Routing to MD to make aware of family decision.  Eugenia Mcalpine

## 2020-01-23 NOTE — Telephone Encounter (Signed)
Order placed for port removal

## 2020-01-23 NOTE — Progress Notes (Signed)
Virtual Visit via Telephone Note  I connected with Olivia Hooper on 01/23/20 at 11:00 AM EST by telephone and verified that I am speaking with the correct person using two identifiers.  Location: Patient: [place] Provider: [facility]   I discussed the limitations, risks, security and privacy concerns of performing an evaluation and management service by telephone and the availability of in person appointments. I also discussed with the patient that there may be a patient responsible charge related to this service. The patient expressed understanding and agreed to proceed.   History of Present Illness:    79 year old lady with RA,  M abscessus lung infection previously on IV cefoxitin along with oral zyvox and clofazamine with weight loss, hair loss and recent pause in M abscessus treatment. She suffered apparent mechanical  fall with broken hip sp IM nail on October 29th, 2021. HOsptial course complicated by E cloacae bacteremia thought to be due to PNA. She was treated with IV cefepime with what should have by now been 10 day course. Port not thought to be infected. She is currently residing at [facility].  SHe was upset that she had been given morphine by mistake   I discussed with the patient and her daughter my concern that despite port not appearing infected it might still harbor GNR that caused her bacteremia.  I would favor removal of the port while we are putting pause on M abscessus therapy to ensure she does not have relapse of her bacteremia     Past Medical History:  Diagnosis Date  . Anemia 05/03/2019  . Anorexia 10/23/2019  . Breast cancer ([place])    on left  . Carpal tunnel syndrome   . COPD (chronic obstructive pulmonary disease) ([place])   . Drug-induced anemia 05/03/2019  . Dyspnea   . Hair loss 10/23/2019  . Hypercalcemia   . Hyperlipidemia   . Hyperparathyroidism ([place]) 10/23/2019  . Osteoporosis   . Pneumonia   . Pulmonary mycobacterial infection ([place]) 03/14/2019   . RA (rheumatoid arthritis) ([place])   . Sleeping excessive 05/03/2019  . Vaccine counseling 10/23/2019  . Vertigo   . Vitamin D deficiency     Past Surgical History:  Procedure Laterality Date  . CARPAL TUNNEL RELEASE    . CATARACT EXTRACTION    . FEMUR IM NAIL Left 01/05/2020   Procedure: INTRAMEDULLARY (IM) NAIL LEFT  FEMORAL;  Surgeon: Rod Can, MD;  Location: [facility] ORS;  Service: Orthopedics;  Laterality: Left;  . IR IMAGING GUIDED PORT INSERTION  04/05/2019  . MASTECTOMY Left   . VESICOVAGINAL FISTULA CLOSURE W/ TAH    . VIDEO BRONCHOSCOPY WITH ENDOBRONCHIAL ULTRASOUND N/A 01/18/2019   Procedure: VIDEO BRONCHOSCOPY WITH ENDOBRONCHIAL ULTRASOUND;  Surgeon: Candee Furbish, MD;  Location: [facility] OR;  Service: Thoracic;  Laterality: N/A;    Family History  Problem Relation Age of Onset  . Heart disease Father   . Bone cancer Father   . Emphysema Father        never smoker  . Heart disease Mother   . Colon cancer Mother   . Melanoma Sister   . Kidney cancer Brother       Social History   Socioeconomic History  . Marital status: Widowed    Spouse name: Not on file  . Number of children: 4  . Years of education: 12+  . Highest education level: Associate degree: occupational, Hotel manager, or vocational program  Occupational History  . Occupation: Retired   Tobacco Use  .  Smoking status: Heavy Tobacco Smoker    Packs/day: 0.50    Years: 62.00    Pack years: 31.00    Types: Cigarettes    Last attempt to quit: 01/12/2019    Years since quitting: 1.0  . Smokeless tobacco: Never Used  Vaping Use  . Vaping Use: Never used  Substance and Sexual Activity  . Alcohol use: Yes    Alcohol/week: 0.0 standard drinks    Comment: occ  . Drug use: No  . Sexual activity: Not on file  Other Topics Concern  . Not on file  Social History Narrative   Lives alone, 4 children, independent.   Social Determinants of Health   Financial Resource Strain: Low Risk   . Difficulty of  Paying Living Expenses: Not hard at all  Food Insecurity: No Food Insecurity  . Worried About Charity fundraiser in the Last Year: Never true  . Ran Out of Food in the Last Year: Never true  Transportation Needs: No Transportation Needs  . Lack of Transportation (Medical): No  . Lack of Transportation (Non-Medical): No  Physical Activity: Inactive  . Days of Exercise per Week: 0 days  . Minutes of Exercise per Session: 0 min  Stress: No Stress Concern Present  . Feeling of Stress : Only a little  Social Connections: Moderately Integrated  . Frequency of Communication with Friends and Family: More than three times a week  . Frequency of Social Gatherings with Friends and Family: More than three times a week  . Attends Religious Services: More than 4 times per year  . Active Member of Clubs or Organizations: Yes  . Attends Archivist Meetings: More than 4 times per year  . Marital Status: Widowed    Allergies  Allergen Reactions  . Buprenorphine Hcl Nausea And Vomiting  . Morphine And Related Nausea And Vomiting          Current Outpatient Medications:  .  acetaminophen (TYLENOL) 500 MG tablet, Take 1,000 mg by mouth every 8 (eight) hours as needed for mild pain. , Disp: , Rfl:  .  albuterol (VENTOLIN HFA) 108 (90 Base) MCG/ACT inhaler, Inhale 2 puffs into the lungs every 6 (six) hours as needed for wheezing or shortness of breath. , Disp: , Rfl:  .  ascorbic acid (VITAMIN C) 500 MG tablet, Take 1 tablet (500 mg total) by mouth 2 (two) times daily., Disp: 30 tablet, Rfl: 0 .  docusate sodium (COLACE) 100 MG capsule, Take 1 capsule (100 mg total) by mouth 2 (two) times daily., Disp: 10 capsule, Rfl: 0 .  enoxaparin (LOVENOX) 30 MG/0.3ML injection, Inject 0.3 mLs (30 mg total) into the skin daily., Disp: 9 mL, Rfl: 0 .  ferrous sulfate 325 (65 FE) MG tablet, Take 1 tablet (325 mg total) by mouth 2 (two) times daily with a meal., Disp: 30 tablet, Rfl: 0 .   guaiFENesin-dextromethorphan (ROBITUSSIN DM) 100-10 MG/5ML syrup, Take 5 mLs by mouth every 4 (four) hours as needed for cough., Disp: 118 mL, Rfl: 0 .  HYDROcodone-acetaminophen (NORCO/VICODIN) 5-325 MG tablet, Take 1-2 tablets by mouth every 4 (four) hours as needed for moderate pain (pain score 4-6)., Disp: 40 tablet, Rfl: 0 .  Ipratropium-Albuterol (COMBIVENT) 20-100 MCG/ACT AERS respimat, Inhale 2 puffs into the lungs every 6 (six) hours., Disp: 4 g, Rfl: 0 .  Lactobacillus (PROBIOTIC ACIDOPHILUS PO), Take 1 tablet by mouth daily., Disp: , Rfl:  .  mometasone-formoterol (DULERA) 100-5 MCG/ACT AERO, Inhale 2 puffs  into the lungs 2 (two) times daily as needed for wheezing or shortness of breath. , Disp: , Rfl:  .  zinc gluconate 50 MG tablet, Take 50 mg by mouth daily., Disp: , Rfl:    Observations/Objective:  She seemed in good spirits over the phone  Assessment and Plan:  E cloacae bacteremia: while may have been due to PNA and port may not have looked infected I would feel better if it was removed  M abscessus lung infection: would continue pause on therapy. Will not be able to use IV agent with port out unless get new IV access  Hip fractur sp surgery   Follow Up Instructions:    I discussed the assessment and treatment plan with the patient. The patient was provided an opportunity to ask questions and all were answered. The patient agreed with the plan and demonstrated an understanding of the instructions.   The patient was advised to call back or seek an in-person evaluation if the symptoms worsen or if the condition fails to improve as anticipated.  I provided 21  minutes of non-face-to-face time during this encounter.   Alcide Evener, MD

## 2020-01-24 DIAGNOSIS — J9610 Chronic respiratory failure, unspecified whether with hypoxia or hypercapnia: Secondary | ICD-10-CM | POA: Diagnosis not present

## 2020-01-24 DIAGNOSIS — G9341 Metabolic encephalopathy: Secondary | ICD-10-CM | POA: Diagnosis not present

## 2020-01-24 DIAGNOSIS — A310 Pulmonary mycobacterial infection: Secondary | ICD-10-CM | POA: Diagnosis not present

## 2020-01-24 DIAGNOSIS — Z8616 Personal history of COVID-19: Secondary | ICD-10-CM | POA: Diagnosis not present

## 2020-01-24 DIAGNOSIS — E782 Mixed hyperlipidemia: Secondary | ICD-10-CM | POA: Diagnosis not present

## 2020-01-24 DIAGNOSIS — I10 Essential (primary) hypertension: Secondary | ICD-10-CM | POA: Diagnosis not present

## 2020-01-24 DIAGNOSIS — M069 Rheumatoid arthritis, unspecified: Secondary | ICD-10-CM | POA: Diagnosis not present

## 2020-01-24 DIAGNOSIS — E440 Moderate protein-calorie malnutrition: Secondary | ICD-10-CM | POA: Diagnosis not present

## 2020-01-24 DIAGNOSIS — D649 Anemia, unspecified: Secondary | ICD-10-CM | POA: Diagnosis not present

## 2020-01-24 DIAGNOSIS — J449 Chronic obstructive pulmonary disease, unspecified: Secondary | ICD-10-CM | POA: Diagnosis not present

## 2020-01-24 DIAGNOSIS — S72002S Fracture of unspecified part of neck of left femur, sequela: Secondary | ICD-10-CM | POA: Diagnosis not present

## 2020-01-24 DIAGNOSIS — Z853 Personal history of malignant neoplasm of breast: Secondary | ICD-10-CM | POA: Diagnosis not present

## 2020-01-24 NOTE — Telephone Encounter (Signed)
CMA unable to reach RN at facility. Spoke with Darla, pt's daughter regarding transportation for appt.  Per Darla; she is able to transport patient if needed. Will update facility during next visit.  Will call IR and schedule appointment.

## 2020-01-24 NOTE — Telephone Encounter (Signed)
Attempted to speak with a nurse at [facility] to set up transportation for Olivia Hooper removal. Left voicemail with nurse to call office back to discuss what days would work best for this appointment.  Will work with IR on scheduling appointment once facility returns call.

## 2020-01-25 DIAGNOSIS — G8918 Other acute postprocedural pain: Secondary | ICD-10-CM | POA: Diagnosis not present

## 2020-01-25 DIAGNOSIS — J984 Other disorders of lung: Secondary | ICD-10-CM | POA: Diagnosis not present

## 2020-01-25 DIAGNOSIS — Z299 Encounter for prophylactic measures, unspecified: Secondary | ICD-10-CM | POA: Diagnosis not present

## 2020-01-25 DIAGNOSIS — S72302D Unspecified fracture of shaft of left femur, subsequent encounter for closed fracture with routine healing: Secondary | ICD-10-CM | POA: Diagnosis not present

## 2020-01-25 DIAGNOSIS — J9611 Chronic respiratory failure with hypoxia: Secondary | ICD-10-CM | POA: Diagnosis not present

## 2020-01-26 NOTE — Telephone Encounter (Signed)
Follow up call placed to [facility] for scheduling port a cath removal. Left voicemail for scheduling. Olivia Hooper

## 2020-01-29 DIAGNOSIS — S72002S Fracture of unspecified part of neck of left femur, sequela: Secondary | ICD-10-CM | POA: Diagnosis not present

## 2020-01-29 DIAGNOSIS — D649 Anemia, unspecified: Secondary | ICD-10-CM | POA: Diagnosis not present

## 2020-01-29 DIAGNOSIS — R262 Difficulty in walking, not elsewhere classified: Secondary | ICD-10-CM | POA: Diagnosis not present

## 2020-01-29 DIAGNOSIS — Z853 Personal history of malignant neoplasm of breast: Secondary | ICD-10-CM | POA: Diagnosis not present

## 2020-01-29 DIAGNOSIS — D508 Other iron deficiency anemias: Secondary | ICD-10-CM | POA: Diagnosis not present

## 2020-01-29 DIAGNOSIS — J449 Chronic obstructive pulmonary disease, unspecified: Secondary | ICD-10-CM | POA: Diagnosis not present

## 2020-01-29 DIAGNOSIS — A310 Pulmonary mycobacterial infection: Secondary | ICD-10-CM | POA: Diagnosis not present

## 2020-01-29 DIAGNOSIS — J9610 Chronic respiratory failure, unspecified whether with hypoxia or hypercapnia: Secondary | ICD-10-CM | POA: Diagnosis not present

## 2020-01-29 DIAGNOSIS — M069 Rheumatoid arthritis, unspecified: Secondary | ICD-10-CM | POA: Diagnosis not present

## 2020-01-29 DIAGNOSIS — G8918 Other acute postprocedural pain: Secondary | ICD-10-CM | POA: Diagnosis not present

## 2020-01-29 DIAGNOSIS — G9341 Metabolic encephalopathy: Secondary | ICD-10-CM | POA: Diagnosis not present

## 2020-01-29 DIAGNOSIS — E782 Mixed hyperlipidemia: Secondary | ICD-10-CM | POA: Diagnosis not present

## 2020-01-29 DIAGNOSIS — Z8616 Personal history of COVID-19: Secondary | ICD-10-CM | POA: Diagnosis not present

## 2020-01-29 DIAGNOSIS — G8929 Other chronic pain: Secondary | ICD-10-CM | POA: Diagnosis not present

## 2020-01-29 DIAGNOSIS — E440 Moderate protein-calorie malnutrition: Secondary | ICD-10-CM | POA: Diagnosis not present

## 2020-01-29 DIAGNOSIS — I10 Essential (primary) hypertension: Secondary | ICD-10-CM | POA: Diagnosis not present

## 2020-01-29 DIAGNOSIS — M0689 Other specified rheumatoid arthritis, multiple sites: Secondary | ICD-10-CM | POA: Diagnosis not present

## 2020-01-29 DIAGNOSIS — S72302D Unspecified fracture of shaft of left femur, subsequent encounter for closed fracture with routine healing: Secondary | ICD-10-CM | POA: Diagnosis not present

## 2020-01-30 DIAGNOSIS — L989 Disorder of the skin and subcutaneous tissue, unspecified: Secondary | ICD-10-CM | POA: Diagnosis not present

## 2020-01-30 DIAGNOSIS — S72145D Nondisplaced intertrochanteric fracture of left femur, subsequent encounter for closed fracture with routine healing: Secondary | ICD-10-CM | POA: Diagnosis not present

## 2020-01-30 NOTE — Telephone Encounter (Signed)
Call placed to [facility] scheduling to follow up on patient scheduling for port a cath removal. Confirmed patient is scheduled at [facility]. Patient to arrive at short stay at 10am for 11:30am appointment. Patient will need a driver and have nothing to eat or drink 6 hours prior to appointment.   Attempted to call Darla, left a voicemail to contact [facility] for appointment information.   Call also placed to patient, spoke with patient and gave her appointment information. Patient will contact [employer] and have her give our office a call for appointment details to coordinate with nursing facility.  Will continue to follow.  Eugenia Mcalpine

## 2020-01-30 NOTE — Telephone Encounter (Signed)
Call returned from [facility]. All appointment information and details given to her. She will coordinate with the nursing home for transportation to this appointment.  Olivia Hooper

## 2020-02-02 DIAGNOSIS — G8929 Other chronic pain: Secondary | ICD-10-CM | POA: Diagnosis not present

## 2020-02-02 DIAGNOSIS — S72302D Unspecified fracture of shaft of left femur, subsequent encounter for closed fracture with routine healing: Secondary | ICD-10-CM | POA: Diagnosis not present

## 2020-02-02 DIAGNOSIS — R262 Difficulty in walking, not elsewhere classified: Secondary | ICD-10-CM | POA: Diagnosis not present

## 2020-02-05 DIAGNOSIS — S72002S Fracture of unspecified part of neck of left femur, sequela: Secondary | ICD-10-CM | POA: Diagnosis not present

## 2020-02-05 DIAGNOSIS — D649 Anemia, unspecified: Secondary | ICD-10-CM | POA: Diagnosis not present

## 2020-02-05 DIAGNOSIS — G9341 Metabolic encephalopathy: Secondary | ICD-10-CM | POA: Diagnosis not present

## 2020-02-05 DIAGNOSIS — E782 Mixed hyperlipidemia: Secondary | ICD-10-CM | POA: Diagnosis not present

## 2020-02-05 DIAGNOSIS — Z8616 Personal history of COVID-19: Secondary | ICD-10-CM | POA: Diagnosis not present

## 2020-02-05 DIAGNOSIS — J9610 Chronic respiratory failure, unspecified whether with hypoxia or hypercapnia: Secondary | ICD-10-CM | POA: Diagnosis not present

## 2020-02-05 DIAGNOSIS — M069 Rheumatoid arthritis, unspecified: Secondary | ICD-10-CM | POA: Diagnosis not present

## 2020-02-05 DIAGNOSIS — I10 Essential (primary) hypertension: Secondary | ICD-10-CM | POA: Diagnosis not present

## 2020-02-05 DIAGNOSIS — A310 Pulmonary mycobacterial infection: Secondary | ICD-10-CM | POA: Diagnosis not present

## 2020-02-05 DIAGNOSIS — Z853 Personal history of malignant neoplasm of breast: Secondary | ICD-10-CM | POA: Diagnosis not present

## 2020-02-05 DIAGNOSIS — E440 Moderate protein-calorie malnutrition: Secondary | ICD-10-CM | POA: Diagnosis not present

## 2020-02-05 DIAGNOSIS — J449 Chronic obstructive pulmonary disease, unspecified: Secondary | ICD-10-CM | POA: Diagnosis not present

## 2020-02-06 ENCOUNTER — Other Ambulatory Visit: Payer: Self-pay | Admitting: Radiology

## 2020-02-06 DIAGNOSIS — R6510 Systemic inflammatory response syndrome (SIRS) of non-infectious origin without acute organ dysfunction: Secondary | ICD-10-CM | POA: Diagnosis not present

## 2020-02-06 DIAGNOSIS — J984 Other disorders of lung: Secondary | ICD-10-CM | POA: Diagnosis not present

## 2020-02-06 DIAGNOSIS — A310 Pulmonary mycobacterial infection: Secondary | ICD-10-CM | POA: Diagnosis not present

## 2020-02-06 DIAGNOSIS — Z299 Encounter for prophylactic measures, unspecified: Secondary | ICD-10-CM | POA: Diagnosis not present

## 2020-02-06 DIAGNOSIS — M810 Age-related osteoporosis without current pathological fracture: Secondary | ICD-10-CM | POA: Diagnosis not present

## 2020-02-06 DIAGNOSIS — D508 Other iron deficiency anemias: Secondary | ICD-10-CM | POA: Diagnosis not present

## 2020-02-06 DIAGNOSIS — S72302D Unspecified fracture of shaft of left femur, subsequent encounter for closed fracture with routine healing: Secondary | ICD-10-CM | POA: Diagnosis not present

## 2020-02-07 ENCOUNTER — Other Ambulatory Visit: Payer: Self-pay | Admitting: Radiology

## 2020-02-07 ENCOUNTER — Encounter: Payer: Self-pay | Admitting: Pulmonary Disease

## 2020-02-07 ENCOUNTER — Other Ambulatory Visit: Payer: Self-pay

## 2020-02-07 ENCOUNTER — Ambulatory Visit (INDEPENDENT_AMBULATORY_CARE_PROVIDER_SITE_OTHER): Payer: Medicare Other | Admitting: Pulmonary Disease

## 2020-02-07 VITALS — BP 124/62 | HR 88 | Temp 98.9°F | Ht 60.5 in | Wt 78.6 lb

## 2020-02-07 DIAGNOSIS — A319 Mycobacterial infection, unspecified: Secondary | ICD-10-CM

## 2020-02-07 DIAGNOSIS — A310 Pulmonary mycobacterial infection: Secondary | ICD-10-CM

## 2020-02-07 DIAGNOSIS — A318 Other mycobacterial infections: Secondary | ICD-10-CM

## 2020-02-07 DIAGNOSIS — J432 Centrilobular emphysema: Secondary | ICD-10-CM

## 2020-02-07 MED ORDER — STIOLTO RESPIMAT 2.5-2.5 MCG/ACT IN AERS: 2.0000 | INHALATION_SPRAY | Freq: Every day | RESPIRATORY_TRACT | 5 refills | Status: AC

## 2020-02-07 NOTE — Patient Instructions (Signed)
Stop dulera  Stiolto two puffs daily  Will schedule CT chest for January 2022 and follow up after that

## 2020-02-07 NOTE — Progress Notes (Signed)
[facility]  Chief Complaint  Patient presents with  . Follow-up    hospital follow up    Constitutional:  BP 124/62 (BP Location: Right Arm, Cuff Size: Normal)   Pulse 88   Temp 98.9 F (37.2 C) (Other (Comment)) Comment (Src): wrist  Ht 5' 0.5" (1.537 m)   Wt 78 lb 9.6 oz (35.7 kg)   SpO2 95%   BMI 15.10 kg/m   Past Medical History:  Vertigo, RA, Mycobacteria abscessus lung infection, Breast cancer on Lt, Hyperparathyroidism, HLD, Weight loss, E cloacae bacteremia  Past Surgical History:  Her  has a past surgical history that includes Vesicovaginal fistula closure w/ TAH; Carpal tunnel release; Cataract extraction; Mastectomy (Left); Video bronchoscopy with endobronchial ultrasound (N/A, 01/18/2019); IR IMAGING GUIDED PORT INSERTION (04/05/2019); and Femur IM nail (Left, 01/05/2020).  Brief Summary:  Olivia Hooper is a 79 y.o. 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         Subjective:   She is here with her daughter.  She is staying at [facility] place after she had hip surgery.  Weight starting to come up.  She is hoping to be discharged later this week.  Not having cough, wheeze, sputum, hemoptysis, or fever.  She no longer needs supplemental oxygen at [facility] place.  Not currently on prednisone.  Only takes this when she has a flare up of her RA.  Physical Exam:   Appearance - thin  ENMT - no sinus tenderness, no oral exudate, no LAN, Mallampati 2 airway, no stridor  Respiratory - decreased breath sounds bilaterally, no wheezing or rales  CV - s1s2 regular rate and rhythm, no murmurs  Ext - no clubbing, no edema, changes of RA in hands  Skin - no rashes  Psych - normal mood and affect   Pulmonary testing:   PFT 07/20/14 >> FEV1 1.41 (73%), FEV1% 63, TLC 4.46 (93%), DLCO 62%  Chest Imaging:   CT chest 09/14/19 >> cavitation RUL 4.6 x  2.8 cm decreased compared to February 2021  Cardiac Tests:   Echo 05/18/19 >> EF 60 to 65%, grade 1 DD, mod elevation in PASP, mod LA dilation  Social History:  She  reports that she quit smoking about 4 weeks ago. Her smoking use included cigarettes. She has a 31.00 pack-year smoking history. She has never used smokeless tobacco. She reports current alcohol use. She reports that she does not use drugs.  Family History:  Her family history includes Bone cancer in her father; Colon cancer in her mother; Emphysema in her father; Heart disease in her father and mother; Kidney cancer in her brother; Melanoma in her sister.     Assessment/Plan:   COPD with emphysema. - will d/c ICS - change from dulera to stiolto - prn albuterol  Mycobacteria abscessus lung infection. - followed by Dr. Tommy Medal with infectious disease - f/u CT chest in January 2022  Rheumatoid arthritis. - used prednisone prn - followed by Dr. Dossie Der with [facility]  Time Spent Involved in Patient Care on Day of Examination:  32 minutes  Follow up:  Patient Instructions  Stop dulera  Stiolto two puffs daily  Will schedule CT chest for January 2022 and follow up after that   Medication List:   Allergies as of 02/07/2020      Reactions   Buprenorphine Hcl Nausea And Vomiting   Morphine And Related Nausea And  Vomiting         Medication List       Accurate as of February 07, 2020 11:51 AM. If you have any questions, ask your nurse or doctor.        STOP taking these medications   Dulera 100-5 MCG/ACT Aero Generic drug: mometasone-formoterol Stopped by: Chesley Mires, MD   guaiFENesin-dextromethorphan 100-10 MG/5ML syrup Commonly known as: ROBITUSSIN DM Stopped by: Chesley Mires, MD     TAKE these medications   acetaminophen 500 MG tablet Commonly known as: TYLENOL Take 1,000 mg by mouth every 8 (eight) hours as needed for mild pain.   albuterol 108 (90 Base) MCG/ACT  inhaler Commonly known as: VENTOLIN HFA Inhale 2 puffs into the lungs every 6 (six) hours as needed for wheezing or shortness of breath.   ascorbic acid 500 MG tablet Commonly known as: VITAMIN C Take 1 tablet (500 mg total) by mouth 2 (two) times daily.   docusate sodium 100 MG capsule Commonly known as: COLACE Take 1 capsule (100 mg total) by mouth 2 (two) times daily.   enoxaparin 30 MG/0.3ML injection Commonly known as: LOVENOX Inject 0.3 mLs (30 mg total) into the skin daily.   ferrous sulfate 325 (65 FE) MG tablet Take 1 tablet (325 mg total) by mouth 2 (two) times daily with a meal.   HYDROcodone-acetaminophen 5-325 MG tablet Commonly known as: NORCO/VICODIN Take 1-2 tablets by mouth every 4 (four) hours as needed for moderate pain (pain score 4-6).   Ipratropium-Albuterol 20-100 MCG/ACT Aers respimat Commonly known as: COMBIVENT Inhale 2 puffs into the lungs every 6 (six) hours.   Mucinex D 60-600 MG 12 hr tablet Generic drug: pseudoephedrine-guaifenesin Take 1 tablet by mouth every 12 (twelve) hours.   PROBIOTIC ACIDOPHILUS PO Take 1 tablet by mouth daily.   Stiolto Respimat 2.5-2.5 MCG/ACT Aers Generic drug: Tiotropium Bromide-Olodaterol Inhale 2 puffs into the lungs daily. Started by: Chesley Mires, MD   traMADol 50 MG tablet Commonly known as: ULTRAM Take 50 mg by mouth 2 (two) times daily as needed.   zinc gluconate 50 MG tablet Take 50 mg by mouth daily.       Signature:  Chesley Mires, MD [place] Pager - [phone] 02/07/2020, 11:51 AM

## 2020-02-08 ENCOUNTER — Encounter (HOSPITAL_COMMUNITY): Payer: Self-pay

## 2020-02-08 ENCOUNTER — Ambulatory Visit (HOSPITAL_COMMUNITY)
Admission: RE | Admit: 2020-02-08 | Discharge: 2020-02-08 | Disposition: A | Discharge status: Home / Self Care | Payer: Medicare Other | Source: Ambulatory Visit | Attending: Infectious Disease | Admitting: Infectious Disease

## 2020-02-08 DIAGNOSIS — G8929 Other chronic pain: Secondary | ICD-10-CM | POA: Diagnosis not present

## 2020-02-08 DIAGNOSIS — R7881 Bacteremia: Secondary | ICD-10-CM | POA: Diagnosis not present

## 2020-02-08 DIAGNOSIS — E212 Other hyperparathyroidism: Secondary | ICD-10-CM | POA: Diagnosis not present

## 2020-02-08 DIAGNOSIS — Z452 Encounter for adjustment and management of vascular access device: Secondary | ICD-10-CM | POA: Insufficient documentation

## 2020-02-08 DIAGNOSIS — J984 Other disorders of lung: Secondary | ICD-10-CM | POA: Diagnosis not present

## 2020-02-08 DIAGNOSIS — D508 Other iron deficiency anemias: Secondary | ICD-10-CM | POA: Diagnosis not present

## 2020-02-08 DIAGNOSIS — S72302D Unspecified fracture of shaft of left femur, subsequent encounter for closed fracture with routine healing: Secondary | ICD-10-CM | POA: Diagnosis not present

## 2020-02-08 DIAGNOSIS — J9611 Chronic respiratory failure with hypoxia: Secondary | ICD-10-CM | POA: Diagnosis not present

## 2020-02-08 DIAGNOSIS — R262 Difficulty in walking, not elsewhere classified: Secondary | ICD-10-CM | POA: Diagnosis not present

## 2020-02-08 HISTORY — PX: IR REMOVAL TUN ACCESS W/ PORT W/O FL MOD SED: IMG2290

## 2020-02-08 LAB — CBC WITH DIFFERENTIAL/PLATELET
Abs Immature Granulocytes: 0.04 10*3/uL (ref 0.00–0.07)
Basophils Absolute: 0.1 10*3/uL (ref 0.0–0.1)
Basophils Relative: 1 %
Eosinophils Absolute: 0.5 10*3/uL (ref 0.0–0.5)
Eosinophils Relative: 4 %
HCT: 32 % — ABNORMAL LOW (ref 36.0–46.0)
Hemoglobin: 9.9 g/dL — ABNORMAL LOW (ref 12.0–15.0)
Immature Granulocytes: 0 %
Lymphocytes Relative: 19 %
Lymphs Abs: 2.2 10*3/uL (ref 0.7–4.0)
MCH: 30 pg (ref 26.0–34.0)
MCHC: 30.9 g/dL (ref 30.0–36.0)
MCV: 97 fL (ref 80.0–100.0)
Monocytes Absolute: 1 10*3/uL (ref 0.1–1.0)
Monocytes Relative: 8 %
Neutro Abs: 8.2 10*3/uL — ABNORMAL HIGH (ref 1.7–7.7)
Neutrophils Relative %: 68 %
Platelets: 346 10*3/uL (ref 150–400)
RBC: 3.3 MIL/uL — ABNORMAL LOW (ref 3.87–5.11)
RDW: 18.6 % — ABNORMAL HIGH (ref 11.5–15.5)
WBC: 11.9 10*3/uL — ABNORMAL HIGH (ref 4.0–10.5)
nRBC: 0 % (ref 0.0–0.2)

## 2020-02-08 LAB — PROTIME-INR
INR: 1 (ref 0.8–1.2)
Prothrombin Time: 12.8 seconds (ref 11.4–15.2)

## 2020-02-08 MED ORDER — LIDOCAINE-EPINEPHRINE (PF) 2 %-1:200000 IJ SOLN
INTRAMUSCULAR | Status: AC
  Filled 2020-02-08: qty 20

## 2020-02-08 MED ORDER — SODIUM CHLORIDE 0.9 % IV SOLN: INTRAVENOUS | Status: DC

## 2020-02-08 MED ORDER — CEFAZOLIN SODIUM-DEXTROSE 2-4 GM/100ML-% IV SOLN
INTRAVENOUS | Status: AC
  Filled 2020-02-08: qty 100

## 2020-02-08 MED ORDER — CEFAZOLIN SODIUM-DEXTROSE 2-4 GM/100ML-% IV SOLN
2.0000 g | INTRAVENOUS | Status: AC
  Administered 2020-02-08: 2 g via INTRAVENOUS

## 2020-02-08 MED ORDER — LIDOCAINE-EPINEPHRINE (PF) 2 %-1:200000 IJ SOLN
INTRAMUSCULAR | Status: AC | PRN
  Administered 2020-02-08: 10 mL

## 2020-02-08 NOTE — Discharge Instructions (Signed)
Urgent needs - IR on call MD [phone]  Wound - May remove dressing and shower in 24 to 48 hours.  Keep site clean and dry.  Replace with bandaid. Do not submerge in tub or water until site healing well.  . Implanted Port Removal, Care After This sheet gives you information about how to care for yourself after your procedure. Your health care provider may also give you more specific instructions. If you have problems or questions, contact your health care provider. What can I expect after the procedure? After the procedure, it is common to have:  Soreness or pain near your incision.  Some swelling or bruising near your incision. Follow these instructions at home: Medicines  Take over-the-counter and prescription medicines only as told by your health care provider.  If you were prescribed an antibiotic medicine, take it as told by your health care provider. Do not stop taking the antibiotic even if you start to feel better. Bathing  Do not take baths, swim, or use a hot tub until your health care provider approves. Ask your health care provider if you can take showers. You may only be allowed to take sponge baths. Incision care  Follow instructions from your health care provider about how to take care of your incision. Make sure you: ? Wash your hands with soap and water before you change your bandage (dressing). If soap and water are not available, use hand sanitizer. ? Change your dressing as told by your health care provider. ? Keep your dressing dry. ? Leave stitches (sutures), skin glue, or adhesive strips in place. These skin closures may need to stay in place for 2 weeks or longer. If adhesive strip edges start to loosen and curl up, you may trim the loose edges. Do not remove adhesive strips completely unless your health care provider tells you to do that.  Check your incision area every day for signs of infection. Check for: ? More redness, swelling, or pain. ? More fluid or  blood. ? Warmth. ? Pus or a bad smell. Driving  Do not drive for 24 hours if you were given a medicine to help you relax (sedative) during your procedure.  If you did not receive a sedative, ask your health care provider when it is safe to drive. Activity  Return to your normal activities as told by your health care provider. Ask your health care provider what activities are safe for you.  Do not lift anything that is heavier than 10 lb (4.5 kg), or the limit that you are told, until your health care provider says that it is safe.  Do not do activities that involve lifting your arms over your head. General instructions  Do not use any products that contain nicotine or tobacco, such as cigarettes and e-cigarettes. These can delay healing. If you need help quitting, ask your health care provider.  Keep all follow-up visits as told by your health care provider. This is important. Contact a health care provider if:  You have more redness, swelling, or pain around your incision.  You have more fluid or blood coming from your incision.  Your incision feels warm to the touch.  You have pus or a bad smell coming from your incision.  You have pain that is not relieved by your pain medicine. Get help right away if you have:  A fever or chills.  Chest pain.  Difficulty breathing. Summary  After the procedure, it is common to have pain, soreness, swelling,  or bruising near your incision.  If you were prescribed an antibiotic medicine, take it as told by your health care provider. Do not stop taking the antibiotic even if you start to feel better.  Do not drive for 24 hours if you were given a sedative during your procedure.  Return to your normal activities as told by your health care provider. Ask your health care provider what activities are safe for you. This information is not intended to replace advice given to you by your health care provider. Make sure you discuss any  questions you have with your health care provider. Document Revised: 04/08/2017 Document Reviewed: 04/08/2017 Elsevier Patient Education  2020 [employer].

## 2020-02-08 NOTE — Progress Notes (Signed)
Patient ID: Olivia Hooper, female   DOB: 1940/04/02, 79 y.o.   MRN: [id number] Patient presents to IR department today for Port-A-Cath removal.  She has a history of rheumatoid arthritis with pulmonary Mycobacterium, recent nailing of broken hip on 79/44/4619 complicated by E cloacae bacteremia thought to be due to pneumonia.  ID has requested port removal while pt on  pause of mycobacterium abscess tx to ensure she does not have relapse of her bacteremia.  Details/risks of procedure, including but not limited to, internal bleeding, infection, injury to adjacent structures discussed with patient with her understanding and consent.  She had food earlier today therefore IV conscious sedation will not be utilized.

## 2020-02-08 NOTE — CV Procedure (Signed)
Interventional Radiology Procedure Note  Procedure: Successful removal of right chest portacatheter.   Complications: None  Estimated Blood Loss: None  Recommendations: - DC home   Signed,  Aaliyah Cancro K. Kobe Jansma, MD   

## 2020-02-09 DIAGNOSIS — J449 Chronic obstructive pulmonary disease, unspecified: Secondary | ICD-10-CM | POA: Diagnosis not present

## 2020-02-09 DIAGNOSIS — E782 Mixed hyperlipidemia: Secondary | ICD-10-CM | POA: Diagnosis not present

## 2020-02-09 DIAGNOSIS — E440 Moderate protein-calorie malnutrition: Secondary | ICD-10-CM | POA: Diagnosis not present

## 2020-02-09 DIAGNOSIS — A310 Pulmonary mycobacterial infection: Secondary | ICD-10-CM | POA: Diagnosis not present

## 2020-02-09 DIAGNOSIS — G9341 Metabolic encephalopathy: Secondary | ICD-10-CM | POA: Diagnosis not present

## 2020-02-09 DIAGNOSIS — D649 Anemia, unspecified: Secondary | ICD-10-CM | POA: Diagnosis not present

## 2020-02-09 DIAGNOSIS — M069 Rheumatoid arthritis, unspecified: Secondary | ICD-10-CM | POA: Diagnosis not present

## 2020-02-09 DIAGNOSIS — Z8616 Personal history of COVID-19: Secondary | ICD-10-CM | POA: Diagnosis not present

## 2020-02-09 DIAGNOSIS — Z853 Personal history of malignant neoplasm of breast: Secondary | ICD-10-CM | POA: Diagnosis not present

## 2020-02-09 DIAGNOSIS — I10 Essential (primary) hypertension: Secondary | ICD-10-CM | POA: Diagnosis not present

## 2020-02-09 DIAGNOSIS — S72002S Fracture of unspecified part of neck of left femur, sequela: Secondary | ICD-10-CM | POA: Diagnosis not present

## 2020-02-09 DIAGNOSIS — J9610 Chronic respiratory failure, unspecified whether with hypoxia or hypercapnia: Secondary | ICD-10-CM | POA: Diagnosis not present

## 2020-02-10 DIAGNOSIS — I10 Essential (primary) hypertension: Secondary | ICD-10-CM | POA: Diagnosis not present

## 2020-02-10 DIAGNOSIS — J9601 Acute respiratory failure with hypoxia: Secondary | ICD-10-CM | POA: Diagnosis not present

## 2020-02-10 DIAGNOSIS — W19XXXD Unspecified fall, subsequent encounter: Secondary | ICD-10-CM | POA: Diagnosis not present

## 2020-02-10 DIAGNOSIS — Z9181 History of falling: Secondary | ICD-10-CM | POA: Diagnosis not present

## 2020-02-10 DIAGNOSIS — A310 Pulmonary mycobacterial infection: Secondary | ICD-10-CM | POA: Diagnosis not present

## 2020-02-10 DIAGNOSIS — Z4800 Encounter for change or removal of nonsurgical wound dressing: Secondary | ICD-10-CM | POA: Diagnosis not present

## 2020-02-10 DIAGNOSIS — G9341 Metabolic encephalopathy: Secondary | ICD-10-CM | POA: Diagnosis not present

## 2020-02-10 DIAGNOSIS — E213 Hyperparathyroidism, unspecified: Secondary | ICD-10-CM | POA: Diagnosis not present

## 2020-02-10 DIAGNOSIS — L89611 Pressure ulcer of right heel, stage 1: Secondary | ICD-10-CM | POA: Diagnosis not present

## 2020-02-10 DIAGNOSIS — E43 Unspecified severe protein-calorie malnutrition: Secondary | ICD-10-CM | POA: Diagnosis not present

## 2020-02-10 DIAGNOSIS — M069 Rheumatoid arthritis, unspecified: Secondary | ICD-10-CM | POA: Diagnosis not present

## 2020-02-10 DIAGNOSIS — L89152 Pressure ulcer of sacral region, stage 2: Secondary | ICD-10-CM | POA: Diagnosis not present

## 2020-02-10 DIAGNOSIS — J440 Chronic obstructive pulmonary disease with acute lower respiratory infection: Secondary | ICD-10-CM | POA: Diagnosis not present

## 2020-02-10 DIAGNOSIS — J9611 Chronic respiratory failure with hypoxia: Secondary | ICD-10-CM | POA: Diagnosis not present

## 2020-02-10 DIAGNOSIS — F17210 Nicotine dependence, cigarettes, uncomplicated: Secondary | ICD-10-CM | POA: Diagnosis not present

## 2020-02-10 DIAGNOSIS — Z7951 Long term (current) use of inhaled steroids: Secondary | ICD-10-CM | POA: Diagnosis not present

## 2020-02-10 DIAGNOSIS — R1310 Dysphagia, unspecified: Secondary | ICD-10-CM | POA: Diagnosis not present

## 2020-02-10 DIAGNOSIS — M80052D Age-related osteoporosis with current pathological fracture, left femur, subsequent encounter for fracture with routine healing: Secondary | ICD-10-CM | POA: Diagnosis not present

## 2020-02-10 DIAGNOSIS — D509 Iron deficiency anemia, unspecified: Secondary | ICD-10-CM | POA: Diagnosis not present

## 2020-02-12 ENCOUNTER — Telehealth: Payer: Self-pay | Admitting: Pharmacist

## 2020-02-12 NOTE — Telephone Encounter (Signed)
2 bottles of clofazimine (~3 month supply) are located in the pharmacy office when patient needs a refill. 

## 2020-02-13 DIAGNOSIS — J440 Chronic obstructive pulmonary disease with acute lower respiratory infection: Secondary | ICD-10-CM | POA: Diagnosis not present

## 2020-02-13 DIAGNOSIS — L89152 Pressure ulcer of sacral region, stage 2: Secondary | ICD-10-CM | POA: Diagnosis not present

## 2020-02-13 DIAGNOSIS — A310 Pulmonary mycobacterial infection: Secondary | ICD-10-CM | POA: Diagnosis not present

## 2020-02-13 DIAGNOSIS — M80052D Age-related osteoporosis with current pathological fracture, left femur, subsequent encounter for fracture with routine healing: Secondary | ICD-10-CM | POA: Diagnosis not present

## 2020-02-13 DIAGNOSIS — W19XXXD Unspecified fall, subsequent encounter: Secondary | ICD-10-CM | POA: Diagnosis not present

## 2020-02-13 DIAGNOSIS — J9611 Chronic respiratory failure with hypoxia: Secondary | ICD-10-CM | POA: Diagnosis not present

## 2020-02-14 ENCOUNTER — Other Ambulatory Visit: Payer: Self-pay | Admitting: *Deleted

## 2020-02-14 DIAGNOSIS — M80052D Age-related osteoporosis with current pathological fracture, left femur, subsequent encounter for fracture with routine healing: Secondary | ICD-10-CM | POA: Diagnosis not present

## 2020-02-14 DIAGNOSIS — J440 Chronic obstructive pulmonary disease with acute lower respiratory infection: Secondary | ICD-10-CM | POA: Diagnosis not present

## 2020-02-14 DIAGNOSIS — A310 Pulmonary mycobacterial infection: Secondary | ICD-10-CM | POA: Diagnosis not present

## 2020-02-14 DIAGNOSIS — L89152 Pressure ulcer of sacral region, stage 2: Secondary | ICD-10-CM | POA: Diagnosis not present

## 2020-02-14 DIAGNOSIS — W19XXXD Unspecified fall, subsequent encounter: Secondary | ICD-10-CM | POA: Diagnosis not present

## 2020-02-14 DIAGNOSIS — J9611 Chronic respiratory failure with hypoxia: Secondary | ICD-10-CM | POA: Diagnosis not present

## 2020-02-14 NOTE — Patient Outreach (Signed)
[facility]) Care Management  02/14/2020  Olivia Hooper [birth year] [id number]  [facility] outreach. Unsuccessful, left message and requested a return call. Called and talked with her daughter briefly. She acknowledges her mother is now at home and getting home PT. She stays with her when she is not at work. She is comfortable with the arrangement. They are setting up an emergency alert.  Will call back later today if she does not return my call.  [name]. [name], MSN, [facility] Gerontological Nurse Practitioner [facility] Care Management [phone]

## 2020-02-15 DIAGNOSIS — M80052D Age-related osteoporosis with current pathological fracture, left femur, subsequent encounter for fracture with routine healing: Secondary | ICD-10-CM | POA: Diagnosis not present

## 2020-02-15 DIAGNOSIS — J9611 Chronic respiratory failure with hypoxia: Secondary | ICD-10-CM | POA: Diagnosis not present

## 2020-02-15 DIAGNOSIS — A310 Pulmonary mycobacterial infection: Secondary | ICD-10-CM | POA: Diagnosis not present

## 2020-02-15 DIAGNOSIS — W19XXXD Unspecified fall, subsequent encounter: Secondary | ICD-10-CM | POA: Diagnosis not present

## 2020-02-15 DIAGNOSIS — J440 Chronic obstructive pulmonary disease with acute lower respiratory infection: Secondary | ICD-10-CM | POA: Diagnosis not present

## 2020-02-15 DIAGNOSIS — L89152 Pressure ulcer of sacral region, stage 2: Secondary | ICD-10-CM | POA: Diagnosis not present

## 2020-02-16 ENCOUNTER — Other Ambulatory Visit: Payer: Self-pay | Admitting: *Deleted

## 2020-02-16 NOTE — Patient Outreach (Signed)
[facility]) Care Management  02/16/2020  Olivia Hooper [birth year] [id number]  Telephone outreach for follow up hip fx and lung infection  Olivia Hooper reports she is doing better everyday. She had a fall, a hip fx and stayed in rehab a couple of weeks. She is home now. Her daughter is staying with her most of the day except when she goes to work. This arrangement is working well. Olivia Hooper is getting home health PT.  She reports she decided to stop the IV antibiotic therapy she was getting. It was very cumbersome and she did not feel it contributed much to her quality of life. She is feeling much better and has also managed to gain a few pounds.  Assessment: Mycobacterium lung infection                        Recent Hip Fx  Goals Addressed            This Visit's Progress   . Patient Stated   On track    Avoid exposure to public with Delta variant spreading. Wear mask, stay distanced and wash hands frequently. Advise family to follow these actions also. 02/19/20 Pt is very conscientious about protecting her health and prevention.    . Patient Stated       Build myself back up and recover from the chemotherapy tx I have been on that was so debilitating.      We will talk again in January. Pt agrees.  Olivia Hooper. Olivia Neither, MSN, [facility] Gerontological Nurse Practitioner [facility] Care Management [phone]

## 2020-02-19 ENCOUNTER — Other Ambulatory Visit: Payer: Self-pay | Admitting: Thoracic Surgery (Cardiothoracic Vascular Surgery)

## 2020-02-19 DIAGNOSIS — M80052D Age-related osteoporosis with current pathological fracture, left femur, subsequent encounter for fracture with routine healing: Secondary | ICD-10-CM | POA: Diagnosis not present

## 2020-02-19 DIAGNOSIS — A310 Pulmonary mycobacterial infection: Secondary | ICD-10-CM | POA: Diagnosis not present

## 2020-02-19 DIAGNOSIS — L89152 Pressure ulcer of sacral region, stage 2: Secondary | ICD-10-CM | POA: Diagnosis not present

## 2020-02-19 DIAGNOSIS — R918 Other nonspecific abnormal finding of lung field: Secondary | ICD-10-CM

## 2020-02-19 DIAGNOSIS — W19XXXD Unspecified fall, subsequent encounter: Secondary | ICD-10-CM | POA: Diagnosis not present

## 2020-02-19 DIAGNOSIS — J440 Chronic obstructive pulmonary disease with acute lower respiratory infection: Secondary | ICD-10-CM | POA: Diagnosis not present

## 2020-02-19 DIAGNOSIS — J9611 Chronic respiratory failure with hypoxia: Secondary | ICD-10-CM | POA: Diagnosis not present

## 2020-02-20 DIAGNOSIS — E875 Hyperkalemia: Secondary | ICD-10-CM | POA: Diagnosis not present

## 2020-02-20 DIAGNOSIS — Z0000 Encounter for general adult medical examination without abnormal findings: Secondary | ICD-10-CM | POA: Diagnosis not present

## 2020-02-20 DIAGNOSIS — S72002S Fracture of unspecified part of neck of left femur, sequela: Secondary | ICD-10-CM | POA: Diagnosis not present

## 2020-02-20 DIAGNOSIS — E785 Hyperlipidemia, unspecified: Secondary | ICD-10-CM | POA: Diagnosis not present

## 2020-02-20 DIAGNOSIS — W19XXXS Unspecified fall, sequela: Secondary | ICD-10-CM | POA: Diagnosis not present

## 2020-02-20 DIAGNOSIS — E559 Vitamin D deficiency, unspecified: Secondary | ICD-10-CM | POA: Diagnosis not present

## 2020-02-20 DIAGNOSIS — M150 Primary generalized (osteo)arthritis: Secondary | ICD-10-CM | POA: Diagnosis not present

## 2020-02-20 DIAGNOSIS — M810 Age-related osteoporosis without current pathological fracture: Secondary | ICD-10-CM | POA: Diagnosis not present

## 2020-02-20 DIAGNOSIS — E213 Hyperparathyroidism, unspecified: Secondary | ICD-10-CM | POA: Diagnosis not present

## 2020-02-21 DIAGNOSIS — J9611 Chronic respiratory failure with hypoxia: Secondary | ICD-10-CM | POA: Diagnosis not present

## 2020-02-21 DIAGNOSIS — M80052D Age-related osteoporosis with current pathological fracture, left femur, subsequent encounter for fracture with routine healing: Secondary | ICD-10-CM | POA: Diagnosis not present

## 2020-02-21 DIAGNOSIS — J440 Chronic obstructive pulmonary disease with acute lower respiratory infection: Secondary | ICD-10-CM | POA: Diagnosis not present

## 2020-02-21 DIAGNOSIS — W19XXXD Unspecified fall, subsequent encounter: Secondary | ICD-10-CM | POA: Diagnosis not present

## 2020-02-21 DIAGNOSIS — A310 Pulmonary mycobacterial infection: Secondary | ICD-10-CM | POA: Diagnosis not present

## 2020-02-21 DIAGNOSIS — L89152 Pressure ulcer of sacral region, stage 2: Secondary | ICD-10-CM | POA: Diagnosis not present

## 2020-02-22 DIAGNOSIS — W19XXXD Unspecified fall, subsequent encounter: Secondary | ICD-10-CM | POA: Diagnosis not present

## 2020-02-22 DIAGNOSIS — J440 Chronic obstructive pulmonary disease with acute lower respiratory infection: Secondary | ICD-10-CM | POA: Diagnosis not present

## 2020-02-22 DIAGNOSIS — L89152 Pressure ulcer of sacral region, stage 2: Secondary | ICD-10-CM | POA: Diagnosis not present

## 2020-02-22 DIAGNOSIS — J9611 Chronic respiratory failure with hypoxia: Secondary | ICD-10-CM | POA: Diagnosis not present

## 2020-02-22 DIAGNOSIS — M80052D Age-related osteoporosis with current pathological fracture, left femur, subsequent encounter for fracture with routine healing: Secondary | ICD-10-CM | POA: Diagnosis not present

## 2020-02-22 DIAGNOSIS — A310 Pulmonary mycobacterial infection: Secondary | ICD-10-CM | POA: Diagnosis not present

## 2020-02-26 DIAGNOSIS — L89152 Pressure ulcer of sacral region, stage 2: Secondary | ICD-10-CM | POA: Diagnosis not present

## 2020-02-26 DIAGNOSIS — J9611 Chronic respiratory failure with hypoxia: Secondary | ICD-10-CM | POA: Diagnosis not present

## 2020-02-26 DIAGNOSIS — W19XXXD Unspecified fall, subsequent encounter: Secondary | ICD-10-CM | POA: Diagnosis not present

## 2020-02-26 DIAGNOSIS — M80052D Age-related osteoporosis with current pathological fracture, left femur, subsequent encounter for fracture with routine healing: Secondary | ICD-10-CM | POA: Diagnosis not present

## 2020-02-26 DIAGNOSIS — J440 Chronic obstructive pulmonary disease with acute lower respiratory infection: Secondary | ICD-10-CM | POA: Diagnosis not present

## 2020-02-26 DIAGNOSIS — A310 Pulmonary mycobacterial infection: Secondary | ICD-10-CM | POA: Diagnosis not present

## 2020-02-27 ENCOUNTER — Encounter: Payer: Self-pay | Admitting: Infectious Disease

## 2020-02-27 ENCOUNTER — Ambulatory Visit (INDEPENDENT_AMBULATORY_CARE_PROVIDER_SITE_OTHER): Payer: Medicare Other | Admitting: Infectious Disease

## 2020-02-27 ENCOUNTER — Other Ambulatory Visit: Payer: Self-pay

## 2020-02-27 VITALS — BP 136/67 | HR 92 | Temp 97.9°F | Wt 83.0 lb

## 2020-02-27 DIAGNOSIS — A310 Pulmonary mycobacterial infection: Secondary | ICD-10-CM | POA: Diagnosis not present

## 2020-02-27 DIAGNOSIS — W19XXXD Unspecified fall, subsequent encounter: Secondary | ICD-10-CM | POA: Diagnosis not present

## 2020-02-27 DIAGNOSIS — T80211D Bloodstream infection due to central venous catheter, subsequent encounter: Secondary | ICD-10-CM | POA: Diagnosis not present

## 2020-02-27 DIAGNOSIS — B9689 Other specified bacterial agents as the cause of diseases classified elsewhere: Secondary | ICD-10-CM

## 2020-02-27 DIAGNOSIS — R7881 Bacteremia: Secondary | ICD-10-CM

## 2020-02-27 DIAGNOSIS — J9611 Chronic respiratory failure with hypoxia: Secondary | ICD-10-CM | POA: Diagnosis not present

## 2020-02-27 DIAGNOSIS — J440 Chronic obstructive pulmonary disease with acute lower respiratory infection: Secondary | ICD-10-CM | POA: Diagnosis not present

## 2020-02-27 DIAGNOSIS — L89152 Pressure ulcer of sacral region, stage 2: Secondary | ICD-10-CM | POA: Diagnosis not present

## 2020-02-27 DIAGNOSIS — M80052D Age-related osteoporosis with current pathological fracture, left femur, subsequent encounter for fracture with routine healing: Secondary | ICD-10-CM | POA: Diagnosis not present

## 2020-02-27 DIAGNOSIS — T80211A Bloodstream infection due to central venous catheter, initial encounter: Secondary | ICD-10-CM

## 2020-02-27 HISTORY — DX: Bloodstream infection due to central venous catheter, initial encounter: T80.211A

## 2020-02-27 NOTE — Progress Notes (Signed)
Subjective:    Patient ID: Olivia Hooper, female    DOB: 1940-12-24, 79 y.o.   MRN: [id number]  HPI   79 year old lady with RA,  M abscessus lung infection previously on IV cefoxitin along with oral zyvox and clofazamine with weight loss, hair loss and recent pause in M abscessus treatment. She suffered fall with broken hip sp IM nail on October 29th, 2021. HOsptial course complicated by E cloacae bacteremia thought to be due to PNA. She was treated with IV cefepime with what should have by now been 10 day course. Port not thought to be infected. She was residing at [facility] but is been discharged from there.  I did a video visit with her at [facility] and voiced my concern about the port being seeded by her bloodstream infection.  Additionally looking through things her cultures popped positive when they were taken on the first was only a few days after admission.  Talking to her and her son today the patient had been more confused prior to her fall and I wonder if her fall was due to the bloodstream infection.  Had the port removed subsequently and the site seems well-healed.  I went to check some surveillance blood cultures today.  On the whole she feels dramatically better off of the medications that we had her on in particular the IV cefoxitin.  She is gaining weight in better spirits says she is doing what she can to be taking care of her self.  Her appetite is much better she is not coughing much at all.  She has been seen by pulmonary and also cardiothoracic surgery and has follow-up imaging planned    Past Medical History:  Diagnosis Date  . Anemia 05/03/2019  . Anorexia 10/23/2019  . Breast cancer ([place])    on left  . Carpal tunnel syndrome   . COPD (chronic obstructive pulmonary disease) ([place])   . Drug-induced anemia 05/03/2019  . Dyspnea   . Hair loss 10/23/2019  . Hypercalcemia   . Hyperlipidemia   . Hyperparathyroidism ([place]) 10/23/2019  . Osteoporosis   . Pneumonia   .  Pulmonary mycobacterial infection ([place]) 03/14/2019  . RA (rheumatoid arthritis) ([place])   . Sleeping excessive 05/03/2019  . Vaccine counseling 10/23/2019  . Vertigo   . Vitamin D deficiency     Past Surgical History:  Procedure Laterality Date  . CARPAL TUNNEL RELEASE    . CATARACT EXTRACTION    . FEMUR IM NAIL Left 01/05/2020   Procedure: INTRAMEDULLARY (IM) NAIL LEFT  FEMORAL;  Surgeon: Rod Can, MD;  Location: [facility] ORS;  Service: Orthopedics;  Laterality: Left;  . IR IMAGING GUIDED PORT INSERTION  04/05/2019  . IR REMOVAL TUN ACCESS W/ PORT W/O FL MOD SED  02/08/2020  . MASTECTOMY Left   . VESICOVAGINAL FISTULA CLOSURE W/ TAH    . VIDEO BRONCHOSCOPY WITH ENDOBRONCHIAL ULTRASOUND N/A 01/18/2019   Procedure: VIDEO BRONCHOSCOPY WITH ENDOBRONCHIAL ULTRASOUND;  Surgeon: Candee Furbish, MD;  Location: [facility] OR;  Service: Thoracic;  Laterality: N/A;    Family History  Problem Relation Age of Onset  . Heart disease Father   . Bone cancer Father   . Emphysema Father        never smoker  . Heart disease Mother   . Colon cancer Mother   . Melanoma Sister   . Kidney cancer Brother       Social History   Socioeconomic History  . Marital status: Widowed  Spouse name: Not on file  . Number of children: 4  . Years of education: 12+  . Highest education level: Associate degree: occupational, Hotel manager, or vocational program  Occupational History  . Occupation: Retired   Tobacco Use  . Smoking status: Former Smoker    Packs/day: 0.50    Years: 62.00    Pack years: 31.00    Types: Cigarettes    Quit date: 01/04/2020    Years since quitting: 0.1  . Smokeless tobacco: Never Used  Vaping Use  . Vaping Use: Never used  Substance and Sexual Activity  . Alcohol use: Yes    Alcohol/week: 0.0 standard drinks    Comment: occ  . Drug use: No  . Sexual activity: Not on file  Other Topics Concern  . Not on file  Social History Narrative   Lives alone, 4 children, independent.    Social Determinants of Health   Financial Resource Strain: Low Risk   . Difficulty of Paying Living Expenses: Not hard at all  Food Insecurity: No Food Insecurity  . Worried About Charity fundraiser in the Last Year: Never true  . Ran Out of Food in the Last Year: Never true  Transportation Needs: No Transportation Needs  . Lack of Transportation (Medical): No  . Lack of Transportation (Non-Medical): No  Physical Activity: Inactive  . Days of Exercise per Week: 0 days  . Minutes of Exercise per Session: 0 min  Stress: No Stress Concern Present  . Feeling of Stress : Only a little  Social Connections: Moderately Integrated  . Frequency of Communication with Friends and Family: More than three times a week  . Frequency of Social Gatherings with Friends and Family: More than three times a week  . Attends Religious Services: More than 4 times per year  . Active Member of Clubs or Organizations: Yes  . Attends Archivist Meetings: More than 4 times per year  . Marital Status: Widowed    Allergies  Allergen Reactions  . Buprenorphine Hcl Nausea And Vomiting  . Morphine And Related Nausea And Vomiting          Current Outpatient Medications:  .  acetaminophen (TYLENOL) 500 MG tablet, Take 1,000 mg by mouth every 8 (eight) hours as needed for mild pain. , Disp: , Rfl:  .  albuterol (VENTOLIN HFA) 108 (90 Base) MCG/ACT inhaler, Inhale 2 puffs into the lungs every 6 (six) hours as needed for wheezing or shortness of breath. , Disp: , Rfl:  .  ascorbic acid (VITAMIN C) 500 MG tablet, Take 1 tablet (500 mg total) by mouth 2 (two) times daily., Disp: 30 tablet, Rfl: 0 .  cholecalciferol (VITAMIN D3) 25 MCG (1000 UNIT) tablet, Take 1,000 Units by mouth daily., Disp: , Rfl:  .  docusate sodium (COLACE) 100 MG capsule, Take 1 capsule (100 mg total) by mouth 2 (two) times daily., Disp: 10 capsule, Rfl: 0 .  enoxaparin (LOVENOX) 30 MG/0.3ML injection, Inject 0.3 mLs (30 mg total)  into the skin daily., Disp: 9 mL, Rfl: 0 .  ferrous sulfate 325 (65 FE) MG tablet, Take 1 tablet (325 mg total) by mouth 2 (two) times daily with a meal., Disp: 30 tablet, Rfl: 0 .  HYDROcodone-acetaminophen (NORCO/VICODIN) 5-325 MG tablet, Take 1-2 tablets by mouth every 4 (four) hours as needed for moderate pain (pain score 4-6)., Disp: 40 tablet, Rfl: 0 .  Ipratropium-Albuterol (COMBIVENT) 20-100 MCG/ACT AERS respimat, Inhale 2 puffs into the lungs every 6 (  six) hours., Disp: 4 g, Rfl: 0 .  Lactobacillus (PROBIOTIC ACIDOPHILUS PO), Take 1 tablet by mouth daily., Disp: , Rfl:  .  pseudoephedrine-guaifenesin (MUCINEX D) 60-600 MG 12 hr tablet, Take 1 tablet by mouth every 12 (twelve) hours., Disp: , Rfl:  .  Tiotropium Bromide-Olodaterol (STIOLTO RESPIMAT) 2.5-2.5 MCG/ACT AERS, Inhale 2 puffs into the lungs daily., Disp: 1 each, Rfl: 5 .  traMADol (ULTRAM) 50 MG tablet, Take 50 mg by mouth 2 (two) times daily as needed., Disp: , Rfl:  .  zinc gluconate 50 MG tablet, Take 50 mg by mouth daily., Disp: , Rfl:    Review of Systems  Constitutional: Negative for activity change, appetite change, chills, diaphoresis, fatigue, fever and unexpected weight change.  HENT: Negative for congestion, rhinorrhea, sinus pressure, sneezing, sore throat and trouble swallowing.   Eyes: Negative for photophobia and visual disturbance.  Respiratory: Negative for cough, chest tightness, shortness of breath, wheezing and stridor.   Cardiovascular: Negative for chest pain, palpitations and leg swelling.  Gastrointestinal: Negative for abdominal distention, abdominal pain, anal bleeding, blood in stool, constipation, diarrhea, nausea and vomiting.  Genitourinary: Negative for difficulty urinating, dysuria, flank pain and hematuria.  Musculoskeletal: Negative for arthralgias, back pain, gait problem, joint swelling and myalgias.  Skin: Negative for color change, pallor, rash and wound.  Neurological: Negative for  dizziness, tremors, weakness and light-headedness.  Hematological: Negative for adenopathy. Does not bruise/bleed easily.  Psychiatric/Behavioral: Negative for agitation, behavioral problems, confusion, decreased concentration, dysphoric mood and sleep disturbance.       Objective:   Physical Exam Constitutional:      General: She is not in acute distress.    Appearance: Normal appearance. She is well-developed and well-nourished. She is not ill-appearing or diaphoretic.  HENT:     Head: Normocephalic and atraumatic.     Right Ear: Hearing and external ear normal.     Left Ear: Hearing and external ear normal.     Nose: No nasal deformity, rhinorrhea or epistaxis.  Eyes:     General: No scleral icterus.    Extraocular Movements: Extraocular movements intact and EOM normal.     Conjunctiva/sclera: Conjunctivae normal.     Right eye: Right conjunctiva is not injected.     Left eye: Left conjunctiva is not injected.  Neck:     Vascular: No JVD.  Cardiovascular:     Rate and Rhythm: Normal rate and regular rhythm.     Heart sounds: Normal heart sounds, S1 normal and S2 normal. No murmur heard. No friction rub. No gallop.   Pulmonary:     Effort: Pulmonary effort is normal. No respiratory distress.     Breath sounds: No stridor. No wheezing.  Abdominal:     General: Bowel sounds are normal. There is no distension or ascites.     Palpations: Abdomen is soft. There is no hepatosplenomegaly.  Musculoskeletal:        General: Normal range of motion.     Right shoulder: Normal.     Left shoulder: Normal.     Cervical back: Normal range of motion and neck supple.     Right hip: Normal.     Left hip: Normal.     Right knee: Normal.     Left knee: Normal.  Lymphadenopathy:     Head:     Right side of head: No submandibular, preauricular or posterior auricular adenopathy.     Left side of head: No submandibular, preauricular or posterior auricular adenopathy.  Cervical: No  cervical adenopathy.     Right cervical: No superficial or deep cervical adenopathy.    Left cervical: No superficial or deep cervical adenopathy.  Skin:    General: Skin is warm, dry and intact.     Coloration: Skin is not pale.     Findings: No abrasion, bruising, ecchymosis, erythema, lesion or rash.     Nails: There is no clubbing or cyanosis.  Neurological:     General: No focal deficit present.     Mental Status: She is alert and oriented to person, place, and time.     Sensory: No sensory deficit.     Coordination: Coordination normal.     Gait: Gait normal.     Deep Tendon Reflexes: Strength normal.  Psychiatric:        Attention and Perception: She is attentive.        Mood and Affect: Mood and affect and mood normal.        Speech: Speech normal.        Behavior: Behavior normal. Behavior is cooperative.        Thought Content: Thought content normal.        Cognition and Memory: Cognition and memory normal.        Judgment: Judgment normal.           Assessment & Plan:  Enterobacter bloodstream infection: We had port removed I will check surveillance blood cultures.  Mycobacterium abscessus pulmonary infection: We will keep her off medications for now she is feeling dramatically better not being treated.  We will have her come back in 3 months time to see [place].  Certainly she is having worsening cough weight loss other systemic symptoms that are making her quality of life worse we would be push more towards treating her and perhaps she could  craft an oral regimen such as linezolid, bedalaqune and clofazamine (if QT interveal was not an issue)

## 2020-02-29 DIAGNOSIS — A310 Pulmonary mycobacterial infection: Secondary | ICD-10-CM | POA: Diagnosis not present

## 2020-02-29 DIAGNOSIS — W19XXXD Unspecified fall, subsequent encounter: Secondary | ICD-10-CM | POA: Diagnosis not present

## 2020-02-29 DIAGNOSIS — M80052D Age-related osteoporosis with current pathological fracture, left femur, subsequent encounter for fracture with routine healing: Secondary | ICD-10-CM | POA: Diagnosis not present

## 2020-02-29 DIAGNOSIS — J9611 Chronic respiratory failure with hypoxia: Secondary | ICD-10-CM | POA: Diagnosis not present

## 2020-02-29 DIAGNOSIS — L89152 Pressure ulcer of sacral region, stage 2: Secondary | ICD-10-CM | POA: Diagnosis not present

## 2020-02-29 DIAGNOSIS — J440 Chronic obstructive pulmonary disease with acute lower respiratory infection: Secondary | ICD-10-CM | POA: Diagnosis not present

## 2020-03-04 LAB — CULTURE, BLOOD (SINGLE)
MICRO NUMBER:: 11348006
MICRO NUMBER:: 11348007
Result:: NO GROWTH
Result:: NO GROWTH
SPECIMEN QUALITY:: ADEQUATE
SPECIMEN QUALITY:: ADEQUATE

## 2020-03-05 ENCOUNTER — Telehealth: Payer: Self-pay | Admitting: Pulmonary Disease

## 2020-03-05 DIAGNOSIS — J440 Chronic obstructive pulmonary disease with acute lower respiratory infection: Secondary | ICD-10-CM | POA: Diagnosis not present

## 2020-03-05 DIAGNOSIS — L89152 Pressure ulcer of sacral region, stage 2: Secondary | ICD-10-CM | POA: Diagnosis not present

## 2020-03-05 DIAGNOSIS — J9611 Chronic respiratory failure with hypoxia: Secondary | ICD-10-CM | POA: Diagnosis not present

## 2020-03-05 DIAGNOSIS — W19XXXD Unspecified fall, subsequent encounter: Secondary | ICD-10-CM | POA: Diagnosis not present

## 2020-03-05 DIAGNOSIS — A310 Pulmonary mycobacterial infection: Secondary | ICD-10-CM | POA: Diagnosis not present

## 2020-03-05 DIAGNOSIS — M80052D Age-related osteoporosis with current pathological fracture, left femur, subsequent encounter for fracture with routine healing: Secondary | ICD-10-CM | POA: Diagnosis not present

## 2020-03-05 NOTE — Telephone Encounter (Signed)
Yes same test just wanted to be sure it was ok to cancel the order

## 2020-03-05 NOTE — Telephone Encounter (Signed)
I would think so since it is already scheduled.  Our docs can still view the results.  It is the same test that they wanted that is being done .

## 2020-03-05 NOTE — Telephone Encounter (Signed)
I think that would be fine.  Thanks

## 2020-03-06 DIAGNOSIS — D509 Iron deficiency anemia, unspecified: Secondary | ICD-10-CM | POA: Diagnosis not present

## 2020-03-06 DIAGNOSIS — E875 Hyperkalemia: Secondary | ICD-10-CM | POA: Diagnosis not present

## 2020-03-06 DIAGNOSIS — M069 Rheumatoid arthritis, unspecified: Secondary | ICD-10-CM | POA: Diagnosis not present

## 2020-03-06 DIAGNOSIS — Z0000 Encounter for general adult medical examination without abnormal findings: Secondary | ICD-10-CM | POA: Diagnosis not present

## 2020-03-06 DIAGNOSIS — E559 Vitamin D deficiency, unspecified: Secondary | ICD-10-CM | POA: Diagnosis not present

## 2020-03-06 DIAGNOSIS — A312 Disseminated mycobacterium avium-intracellulare complex (DMAC): Secondary | ICD-10-CM | POA: Diagnosis not present

## 2020-03-06 NOTE — Telephone Encounter (Signed)
noted 

## 2020-03-07 DIAGNOSIS — W19XXXD Unspecified fall, subsequent encounter: Secondary | ICD-10-CM | POA: Diagnosis not present

## 2020-03-07 DIAGNOSIS — M80052D Age-related osteoporosis with current pathological fracture, left femur, subsequent encounter for fracture with routine healing: Secondary | ICD-10-CM | POA: Diagnosis not present

## 2020-03-07 DIAGNOSIS — J440 Chronic obstructive pulmonary disease with acute lower respiratory infection: Secondary | ICD-10-CM | POA: Diagnosis not present

## 2020-03-07 DIAGNOSIS — J9611 Chronic respiratory failure with hypoxia: Secondary | ICD-10-CM | POA: Diagnosis not present

## 2020-03-07 DIAGNOSIS — A310 Pulmonary mycobacterial infection: Secondary | ICD-10-CM | POA: Diagnosis not present

## 2020-03-07 DIAGNOSIS — L89152 Pressure ulcer of sacral region, stage 2: Secondary | ICD-10-CM | POA: Diagnosis not present

## 2020-03-11 DIAGNOSIS — W19XXXD Unspecified fall, subsequent encounter: Secondary | ICD-10-CM | POA: Diagnosis not present

## 2020-03-11 DIAGNOSIS — Z4800 Encounter for change or removal of nonsurgical wound dressing: Secondary | ICD-10-CM | POA: Diagnosis not present

## 2020-03-11 DIAGNOSIS — I10 Essential (primary) hypertension: Secondary | ICD-10-CM | POA: Diagnosis not present

## 2020-03-11 DIAGNOSIS — E213 Hyperparathyroidism, unspecified: Secondary | ICD-10-CM | POA: Diagnosis not present

## 2020-03-11 DIAGNOSIS — R1310 Dysphagia, unspecified: Secondary | ICD-10-CM | POA: Diagnosis not present

## 2020-03-11 DIAGNOSIS — J9611 Chronic respiratory failure with hypoxia: Secondary | ICD-10-CM | POA: Diagnosis not present

## 2020-03-11 DIAGNOSIS — L89611 Pressure ulcer of right heel, stage 1: Secondary | ICD-10-CM | POA: Diagnosis not present

## 2020-03-11 DIAGNOSIS — L89152 Pressure ulcer of sacral region, stage 2: Secondary | ICD-10-CM | POA: Diagnosis not present

## 2020-03-11 DIAGNOSIS — Z9181 History of falling: Secondary | ICD-10-CM | POA: Diagnosis not present

## 2020-03-11 DIAGNOSIS — F17210 Nicotine dependence, cigarettes, uncomplicated: Secondary | ICD-10-CM | POA: Diagnosis not present

## 2020-03-11 DIAGNOSIS — Z7951 Long term (current) use of inhaled steroids: Secondary | ICD-10-CM | POA: Diagnosis not present

## 2020-03-11 DIAGNOSIS — J440 Chronic obstructive pulmonary disease with acute lower respiratory infection: Secondary | ICD-10-CM | POA: Diagnosis not present

## 2020-03-11 DIAGNOSIS — M80052D Age-related osteoporosis with current pathological fracture, left femur, subsequent encounter for fracture with routine healing: Secondary | ICD-10-CM | POA: Diagnosis not present

## 2020-03-11 DIAGNOSIS — E43 Unspecified severe protein-calorie malnutrition: Secondary | ICD-10-CM | POA: Diagnosis not present

## 2020-03-11 DIAGNOSIS — D509 Iron deficiency anemia, unspecified: Secondary | ICD-10-CM | POA: Diagnosis not present

## 2020-03-11 DIAGNOSIS — G9341 Metabolic encephalopathy: Secondary | ICD-10-CM | POA: Diagnosis not present

## 2020-03-11 DIAGNOSIS — M069 Rheumatoid arthritis, unspecified: Secondary | ICD-10-CM | POA: Diagnosis not present

## 2020-03-11 DIAGNOSIS — A310 Pulmonary mycobacterial infection: Secondary | ICD-10-CM | POA: Diagnosis not present

## 2020-03-13 DIAGNOSIS — M80052D Age-related osteoporosis with current pathological fracture, left femur, subsequent encounter for fracture with routine healing: Secondary | ICD-10-CM | POA: Diagnosis not present

## 2020-03-13 DIAGNOSIS — J440 Chronic obstructive pulmonary disease with acute lower respiratory infection: Secondary | ICD-10-CM | POA: Diagnosis not present

## 2020-03-13 DIAGNOSIS — A310 Pulmonary mycobacterial infection: Secondary | ICD-10-CM | POA: Diagnosis not present

## 2020-03-13 DIAGNOSIS — L89152 Pressure ulcer of sacral region, stage 2: Secondary | ICD-10-CM | POA: Diagnosis not present

## 2020-03-13 DIAGNOSIS — W19XXXD Unspecified fall, subsequent encounter: Secondary | ICD-10-CM | POA: Diagnosis not present

## 2020-03-13 DIAGNOSIS — J9611 Chronic respiratory failure with hypoxia: Secondary | ICD-10-CM | POA: Diagnosis not present

## 2020-03-15 DIAGNOSIS — J9611 Chronic respiratory failure with hypoxia: Secondary | ICD-10-CM | POA: Diagnosis not present

## 2020-03-15 DIAGNOSIS — L89152 Pressure ulcer of sacral region, stage 2: Secondary | ICD-10-CM | POA: Diagnosis not present

## 2020-03-15 DIAGNOSIS — A310 Pulmonary mycobacterial infection: Secondary | ICD-10-CM | POA: Diagnosis not present

## 2020-03-15 DIAGNOSIS — M80052D Age-related osteoporosis with current pathological fracture, left femur, subsequent encounter for fracture with routine healing: Secondary | ICD-10-CM | POA: Diagnosis not present

## 2020-03-15 DIAGNOSIS — J440 Chronic obstructive pulmonary disease with acute lower respiratory infection: Secondary | ICD-10-CM | POA: Diagnosis not present

## 2020-03-15 DIAGNOSIS — W19XXXD Unspecified fall, subsequent encounter: Secondary | ICD-10-CM | POA: Diagnosis not present

## 2020-03-18 ENCOUNTER — Other Ambulatory Visit: Payer: Self-pay | Admitting: *Deleted

## 2020-03-18 DIAGNOSIS — W19XXXD Unspecified fall, subsequent encounter: Secondary | ICD-10-CM | POA: Diagnosis not present

## 2020-03-18 DIAGNOSIS — J9611 Chronic respiratory failure with hypoxia: Secondary | ICD-10-CM | POA: Diagnosis not present

## 2020-03-18 DIAGNOSIS — M80052D Age-related osteoporosis with current pathological fracture, left femur, subsequent encounter for fracture with routine healing: Secondary | ICD-10-CM | POA: Diagnosis not present

## 2020-03-18 DIAGNOSIS — A310 Pulmonary mycobacterial infection: Secondary | ICD-10-CM | POA: Diagnosis not present

## 2020-03-18 DIAGNOSIS — J440 Chronic obstructive pulmonary disease with acute lower respiratory infection: Secondary | ICD-10-CM | POA: Diagnosis not present

## 2020-03-18 DIAGNOSIS — L89152 Pressure ulcer of sacral region, stage 2: Secondary | ICD-10-CM | POA: Diagnosis not present

## 2020-03-18 NOTE — Patient Outreach (Signed)
[facility]) Care Management  03/18/2020  Olivia Hooper 09-Jul-1940 [id number]  Telephone outreach and case closure.  Goals Addressed            This Visit's Progress   . COMPLETED: Patient Stated   On track    Avoid exposure to public with Delta variant spreading. Wear mask, stay distanced and wash hands frequently. Advise family to follow these actions also. 02/19/20 Pt is very conscientious about protecting her health and prevention. 03/18/20 Advised pt to NOW continue her prevention steps to avoid [facility] exposure.    . COMPLETED: Patient Stated       Build myself back up and recover from the chemotherapy tx I have been on that was so debilitating. 03/18/20 Pt is feeling stronger, has gained 4#, states she, "Feels good!"      Olivia Hooper and I have discussed that her condition is stable now and that she can be "graduated" from [place] [facility]. Reinforced the need for her to get in touch with her primary care provider office at first signs of illness for evaluation and avoidance of complications or hospitalization.  Sending case closure letters to pt and MD.  Olivia Hooper. Olivia Neither, MSN, [facility] Gerontological Nurse Practitioner [facility] Care Management [phone]

## 2020-03-19 ENCOUNTER — Other Ambulatory Visit: Payer: Medicare Other

## 2020-03-22 DIAGNOSIS — L89152 Pressure ulcer of sacral region, stage 2: Secondary | ICD-10-CM | POA: Diagnosis not present

## 2020-03-22 DIAGNOSIS — J440 Chronic obstructive pulmonary disease with acute lower respiratory infection: Secondary | ICD-10-CM | POA: Diagnosis not present

## 2020-03-22 DIAGNOSIS — J9611 Chronic respiratory failure with hypoxia: Secondary | ICD-10-CM | POA: Diagnosis not present

## 2020-03-22 DIAGNOSIS — W19XXXD Unspecified fall, subsequent encounter: Secondary | ICD-10-CM | POA: Diagnosis not present

## 2020-03-22 DIAGNOSIS — A310 Pulmonary mycobacterial infection: Secondary | ICD-10-CM | POA: Diagnosis not present

## 2020-03-22 DIAGNOSIS — M80052D Age-related osteoporosis with current pathological fracture, left femur, subsequent encounter for fracture with routine healing: Secondary | ICD-10-CM | POA: Diagnosis not present

## 2020-03-26 ENCOUNTER — Ambulatory Visit: Payer: Medicare Other | Admitting: Thoracic Surgery (Cardiothoracic Vascular Surgery)

## 2020-03-26 ENCOUNTER — Other Ambulatory Visit: Payer: Medicare Other

## 2020-04-02 ENCOUNTER — Ambulatory Visit: Payer: Medicare Other | Admitting: Podiatry

## 2020-04-02 DIAGNOSIS — M80052D Age-related osteoporosis with current pathological fracture, left femur, subsequent encounter for fracture with routine healing: Secondary | ICD-10-CM | POA: Diagnosis not present

## 2020-04-02 DIAGNOSIS — J440 Chronic obstructive pulmonary disease with acute lower respiratory infection: Secondary | ICD-10-CM | POA: Diagnosis not present

## 2020-04-02 DIAGNOSIS — W19XXXD Unspecified fall, subsequent encounter: Secondary | ICD-10-CM | POA: Diagnosis not present

## 2020-04-02 DIAGNOSIS — L89152 Pressure ulcer of sacral region, stage 2: Secondary | ICD-10-CM | POA: Diagnosis not present

## 2020-04-02 DIAGNOSIS — A310 Pulmonary mycobacterial infection: Secondary | ICD-10-CM | POA: Diagnosis not present

## 2020-04-02 DIAGNOSIS — J9611 Chronic respiratory failure with hypoxia: Secondary | ICD-10-CM | POA: Diagnosis not present

## 2020-04-03 ENCOUNTER — Ambulatory Visit: Payer: Medicare Other | Admitting: Pulmonary Disease

## 2020-04-08 DIAGNOSIS — J440 Chronic obstructive pulmonary disease with acute lower respiratory infection: Secondary | ICD-10-CM | POA: Diagnosis not present

## 2020-04-08 DIAGNOSIS — A310 Pulmonary mycobacterial infection: Secondary | ICD-10-CM | POA: Diagnosis not present

## 2020-04-08 DIAGNOSIS — L89152 Pressure ulcer of sacral region, stage 2: Secondary | ICD-10-CM | POA: Diagnosis not present

## 2020-04-08 DIAGNOSIS — M80052D Age-related osteoporosis with current pathological fracture, left femur, subsequent encounter for fracture with routine healing: Secondary | ICD-10-CM | POA: Diagnosis not present

## 2020-04-08 DIAGNOSIS — W19XXXD Unspecified fall, subsequent encounter: Secondary | ICD-10-CM | POA: Diagnosis not present

## 2020-04-08 DIAGNOSIS — J9611 Chronic respiratory failure with hypoxia: Secondary | ICD-10-CM | POA: Diagnosis not present

## 2020-04-09 ENCOUNTER — Ambulatory Visit
Admission: RE | Admit: 2020-04-09 | Discharge: 2020-04-09 | Disposition: A | Discharge status: Home / Self Care | Payer: Medicare Other | Source: Ambulatory Visit | Attending: Thoracic Surgery (Cardiothoracic Vascular Surgery) | Admitting: Thoracic Surgery (Cardiothoracic Vascular Surgery)

## 2020-04-09 DIAGNOSIS — R918 Other nonspecific abnormal finding of lung field: Secondary | ICD-10-CM | POA: Diagnosis not present

## 2020-04-10 DIAGNOSIS — E43 Unspecified severe protein-calorie malnutrition: Secondary | ICD-10-CM | POA: Diagnosis not present

## 2020-04-10 DIAGNOSIS — Z4800 Encounter for change or removal of nonsurgical wound dressing: Secondary | ICD-10-CM | POA: Diagnosis not present

## 2020-04-10 DIAGNOSIS — J449 Chronic obstructive pulmonary disease, unspecified: Secondary | ICD-10-CM | POA: Diagnosis not present

## 2020-04-10 DIAGNOSIS — M80052D Age-related osteoporosis with current pathological fracture, left femur, subsequent encounter for fracture with routine healing: Secondary | ICD-10-CM | POA: Diagnosis not present

## 2020-04-10 DIAGNOSIS — E213 Hyperparathyroidism, unspecified: Secondary | ICD-10-CM | POA: Diagnosis not present

## 2020-04-10 DIAGNOSIS — I10 Essential (primary) hypertension: Secondary | ICD-10-CM | POA: Diagnosis not present

## 2020-04-10 DIAGNOSIS — F17210 Nicotine dependence, cigarettes, uncomplicated: Secondary | ICD-10-CM | POA: Diagnosis not present

## 2020-04-10 DIAGNOSIS — W19XXXD Unspecified fall, subsequent encounter: Secondary | ICD-10-CM | POA: Diagnosis not present

## 2020-04-10 DIAGNOSIS — Z7951 Long term (current) use of inhaled steroids: Secondary | ICD-10-CM | POA: Diagnosis not present

## 2020-04-10 DIAGNOSIS — Z9181 History of falling: Secondary | ICD-10-CM | POA: Diagnosis not present

## 2020-04-10 DIAGNOSIS — M069 Rheumatoid arthritis, unspecified: Secondary | ICD-10-CM | POA: Diagnosis not present

## 2020-04-10 DIAGNOSIS — D509 Iron deficiency anemia, unspecified: Secondary | ICD-10-CM | POA: Diagnosis not present

## 2020-04-10 DIAGNOSIS — R1310 Dysphagia, unspecified: Secondary | ICD-10-CM | POA: Diagnosis not present

## 2020-04-10 DIAGNOSIS — J9611 Chronic respiratory failure with hypoxia: Secondary | ICD-10-CM | POA: Diagnosis not present

## 2020-04-12 DIAGNOSIS — M069 Rheumatoid arthritis, unspecified: Secondary | ICD-10-CM | POA: Diagnosis not present

## 2020-04-12 DIAGNOSIS — J449 Chronic obstructive pulmonary disease, unspecified: Secondary | ICD-10-CM | POA: Diagnosis not present

## 2020-04-12 DIAGNOSIS — J9611 Chronic respiratory failure with hypoxia: Secondary | ICD-10-CM | POA: Diagnosis not present

## 2020-04-12 DIAGNOSIS — D509 Iron deficiency anemia, unspecified: Secondary | ICD-10-CM | POA: Diagnosis not present

## 2020-04-12 DIAGNOSIS — W19XXXD Unspecified fall, subsequent encounter: Secondary | ICD-10-CM | POA: Diagnosis not present

## 2020-04-12 DIAGNOSIS — M80052D Age-related osteoporosis with current pathological fracture, left femur, subsequent encounter for fracture with routine healing: Secondary | ICD-10-CM | POA: Diagnosis not present

## 2020-04-15 DIAGNOSIS — W19XXXD Unspecified fall, subsequent encounter: Secondary | ICD-10-CM | POA: Diagnosis not present

## 2020-04-15 DIAGNOSIS — M80052D Age-related osteoporosis with current pathological fracture, left femur, subsequent encounter for fracture with routine healing: Secondary | ICD-10-CM | POA: Diagnosis not present

## 2020-04-15 DIAGNOSIS — J9611 Chronic respiratory failure with hypoxia: Secondary | ICD-10-CM | POA: Diagnosis not present

## 2020-04-15 DIAGNOSIS — J449 Chronic obstructive pulmonary disease, unspecified: Secondary | ICD-10-CM | POA: Diagnosis not present

## 2020-04-15 DIAGNOSIS — D509 Iron deficiency anemia, unspecified: Secondary | ICD-10-CM | POA: Diagnosis not present

## 2020-04-15 DIAGNOSIS — M069 Rheumatoid arthritis, unspecified: Secondary | ICD-10-CM | POA: Diagnosis not present

## 2020-04-16 ENCOUNTER — Encounter: Payer: Self-pay | Admitting: Thoracic Surgery (Cardiothoracic Vascular Surgery)

## 2020-04-16 ENCOUNTER — Ambulatory Visit (INDEPENDENT_AMBULATORY_CARE_PROVIDER_SITE_OTHER): Payer: Medicare Other | Admitting: Thoracic Surgery (Cardiothoracic Vascular Surgery)

## 2020-04-16 ENCOUNTER — Other Ambulatory Visit: Payer: Self-pay

## 2020-04-16 VITALS — BP 148/73 | HR 85 | Temp 97.6°F | Resp 20 | Ht 61.0 in | Wt 85.0 lb

## 2020-04-16 DIAGNOSIS — R918 Other nonspecific abnormal finding of lung field: Secondary | ICD-10-CM | POA: Diagnosis not present

## 2020-04-16 NOTE — Progress Notes (Signed)
[address]       [place] [address]             [phone]    HPI: [name] returns for a scheduled follow-up of her cavitary lung masses and nodules.  Olivia Hooper is a an 81 year old woman with a history of tobacco abuse, COPD, rheumatoid arthritis, breast cancer, hyperparathyroidism, hyperlipidemia, osteoporosis, and vertigo.  She presented in the fall 2020 with hemoptysis.  She was found to have cavitary lung masses including a large mass in the right upper lobe.  Biopsies were negative for cancer and cultures grew out MAC.  She is followed by Dr. Tommy Medal of ID.  She has been treated with multiple antibiotics.  She is been on and off of those at times due to side effects.  She currently is not on any treatment.  She denies any fevers, chills, night sweats, and hemoptysis.  She has not had any significant change to her respiratory status.  She had a femur fracture on the left which required surgery.  That is improved but she is now having some pain in her right leg.  Past Medical History:  Diagnosis Date  . Anemia 05/03/2019  . Anorexia 10/23/2019  . Bloodstream infection due to Port-A-Cath 02/27/2020  . Breast cancer ([place])    on left  . Carpal tunnel syndrome   . COPD (chronic obstructive pulmonary disease) ([place])   . Drug-induced anemia 05/03/2019  . Dyspnea   . Hair loss 10/23/2019  . Hypercalcemia   . Hyperlipidemia   . Hyperparathyroidism ([place]) 10/23/2019  . Osteoporosis   . Pneumonia   . 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 RA (rheumatoid arthritis) ([place])   . Sleeping excessive 05/03/2019  . Vaccine counseling 10/23/2019  . Vertigo   . Vitamin D deficiency     Current Outpatient Medications  Medication Sig Dispense Refill  . acetaminophen (TYLENOL) 500 MG tablet Take 1,000 mg by mouth every 8 (eight) hours as needed for mild pain.     Marland Kitchen albuterol (VENTOLIN HFA) 108 (90 Base) MCG/ACT inhaler Inhale 2 puffs into the lungs every 6  (six) hours as needed for wheezing or shortness of breath.     Marland Kitchen ascorbic acid (VITAMIN C) 500 MG tablet Take 1 tablet (500 mg total) by mouth 2 (two) times daily. 30 tablet 0  . cholecalciferol (VITAMIN D3) 25 MCG (1000 UNIT) tablet Take 1,000 Units by mouth daily.    Marland Kitchen docusate sodium (COLACE) 100 MG capsule Take 1 capsule (100 mg total) by mouth 2 (two) times daily. 10 capsule 0  . enoxaparin (LOVENOX) 30 MG/0.3ML injection Inject 0.3 mLs (30 mg total) into the skin daily. 9 mL 0  . ferrous sulfate 325 (65 FE) MG tablet Take 1 tablet (325 mg total) by mouth 2 (two) times daily with a meal. 30 tablet 0  . HYDROcodone-acetaminophen (NORCO/VICODIN) 5-325 MG tablet Take 1-2 tablets by mouth every 4 (four) hours as needed for moderate pain (pain score 4-6). 40 tablet 0  . Ipratropium-Albuterol (COMBIVENT) 20-100 MCG/ACT AERS respimat Inhale 2 puffs into the lungs every 6 (six) hours. 4 g 0  . Lactobacillus (PROBIOTIC ACIDOPHILUS PO) Take 1 tablet by mouth daily.    . pseudoephedrine-guaifenesin (MUCINEX D) 60-600 MG 12 hr tablet Take 1 tablet by mouth every 12 (twelve) hours.    . Tiotropium Bromide-Olodaterol (STIOLTO RESPIMAT) 2.5-2.5 MCG/ACT AERS Inhale 2 puffs into the lungs daily. 1 each 5  .  traMADol (ULTRAM) 50 MG tablet Take 50 mg by mouth 2 (two) times daily as needed.    . zinc gluconate 50 MG tablet Take 50 mg by mouth daily.     No current facility-administered medications for this visit.    Physical Exam BP (!) 148/73   Pulse 85   Temp 97.6 F (36.4 C) (Skin)   Resp 20   Ht _0  (1.549 m)   Wt 85 lb (38.6 kg)   SpO2 96% Comment: RA  BMI 16.06 kg/m  Elderly, frail 80 year old woman in no acute distress Alert and oriented x3 Kyphoscoliosis Lungs with diminished breath sounds bilaterally, no rales or wheezing Cardiac regular rate and rhythm   Diagnostic Tests: CT CHEST WITHOUT CONTRAST  TECHNIQUE: Multidetector CT imaging of the chest was performed following  the standard protocol without IV contrast.  COMPARISON:  09/14/2019  FINDINGS: Cardiovascular: Aortic atherosclerosis. Normal heart size. Left coronary artery calcifications no pericardial effusion.  Mediastinum/Nodes: No enlarged mediastinal, hilar, or axillary lymph nodes. Thyroid gland, trachea, and esophagus demonstrate no significant findings.  Lungs/Pleura: Mild centrilobular emphysema. Slight interval increase in size of a thick-walled cavitary lesion of the posterior right upper lobe and adjacent superior segment right lower lobe, greatest axial dimension 5.2 x 4.1 cm, previously 4.6 x 3.5 cm when measured similarly (series 8, image 34). There is a new subpleural nodule of the medial right lung base measuring 0.9 x 0.5 cm (series 8, image 96). Other nodules and consolidations are diminished, for example a a previously cavitary subpleural nodule of the anterior right upper lobe now measuring 1.0 x 0.6 cm, previously 1.2 x 1.0 cm (series 8, image 72) and bandlike residua in the dependent right lower lobe (series 8, image 90). No pleural effusion or pneumothorax.  Upper Abdomen: No acute abnormality. Punctuate calcifications of the liver and spleen, in keeping with history of prior granulomatous infection.  Musculoskeletal: No chest wall mass or suspicious bone lesions identified. Unchanged and endplate deformities of the thoracic spine, notable for a high-grade vertebral plana deformity of T3. Status post left mastectomy with implant reconstruction.  IMPRESSION: 1. Slight interval increase in size of a thick-walled cavitary lesion of the posterior right upper lobe and adjacent superior segment right lower lobe, greatest axial dimension 5.2 x 4.1 cm, previously 4.6 x 3.5 cm when measured similarly. 2. There is a new subpleural nodule of the medial right lung base measuring 0.9 x 0.5 cm. Other nodules and consolidations are diminished, for example a previously  cavitary subpleural nodule of the anterior right upper lobe. 3. Fluctuant constellation of findings is consistent with ongoing atypical infection, particularly atypical mycobacterium. 4. Emphysema. 5. Coronary artery disease.  Aortic Atherosclerosis (ICD10-I70.0) and Emphysema (ICD10-J43.9).   Electronically Signed   By: Eddie Candle M.D.   On: 04/09/2020 10:31 I personally reviewed the CT images and concur with the findings of waxing and waning lung nodules and a slight interval increase in the size of the largest cavitary lesion in the right upper lobe  Impression: Olivia Hooper is an 80 year old woman with numerous medical problems including tobacco abuse, COPD, rheumatoid arthritis, breast cancer, hyperparathyroidism, hyperlipidemia, osteoporosis, and vertigo.  She has multiple cavitary lesions in the right lung dating back a couple of years now.  The largest is in the right upper lobe and is complex.  There are multiple smaller cavitary lesions as well.  Biopsies showed no evidence of cancer but cultures did grow out MAC.  These lesions have waxed and waned  with her treatment for MAC infection.  MAC infection-currently off antibiotics.  She had a lot of issues with side effects.  That may be why the largest lesion appears to be slightly larger and there is also a new lesion noted on her scan.  I do not see anything that would warrant rebiopsy.  No recurrent hemoptysis.  She sees Dr. Tommy Medal again in March.  Tobacco abuse-quit smoking about 4 months ago.  Plan: Follow-up as scheduled with Dr. Tommy Medal Return in 6 months with repeat CT chest  Melrose Nakayama, MD [facility] [phone]

## 2020-04-17 DIAGNOSIS — J9611 Chronic respiratory failure with hypoxia: Secondary | ICD-10-CM | POA: Diagnosis not present

## 2020-04-17 DIAGNOSIS — W19XXXD Unspecified fall, subsequent encounter: Secondary | ICD-10-CM | POA: Diagnosis not present

## 2020-04-17 DIAGNOSIS — M069 Rheumatoid arthritis, unspecified: Secondary | ICD-10-CM | POA: Diagnosis not present

## 2020-04-17 DIAGNOSIS — M80052D Age-related osteoporosis with current pathological fracture, left femur, subsequent encounter for fracture with routine healing: Secondary | ICD-10-CM | POA: Diagnosis not present

## 2020-04-17 DIAGNOSIS — J449 Chronic obstructive pulmonary disease, unspecified: Secondary | ICD-10-CM | POA: Diagnosis not present

## 2020-04-17 DIAGNOSIS — D509 Iron deficiency anemia, unspecified: Secondary | ICD-10-CM | POA: Diagnosis not present

## 2020-04-19 DIAGNOSIS — J9611 Chronic respiratory failure with hypoxia: Secondary | ICD-10-CM | POA: Diagnosis not present

## 2020-04-19 DIAGNOSIS — J449 Chronic obstructive pulmonary disease, unspecified: Secondary | ICD-10-CM | POA: Diagnosis not present

## 2020-04-19 DIAGNOSIS — W19XXXD Unspecified fall, subsequent encounter: Secondary | ICD-10-CM | POA: Diagnosis not present

## 2020-04-19 DIAGNOSIS — M069 Rheumatoid arthritis, unspecified: Secondary | ICD-10-CM | POA: Diagnosis not present

## 2020-04-19 DIAGNOSIS — M80052D Age-related osteoporosis with current pathological fracture, left femur, subsequent encounter for fracture with routine healing: Secondary | ICD-10-CM | POA: Diagnosis not present

## 2020-04-19 DIAGNOSIS — D509 Iron deficiency anemia, unspecified: Secondary | ICD-10-CM | POA: Diagnosis not present

## 2020-04-22 DIAGNOSIS — W19XXXD Unspecified fall, subsequent encounter: Secondary | ICD-10-CM | POA: Diagnosis not present

## 2020-04-22 DIAGNOSIS — M069 Rheumatoid arthritis, unspecified: Secondary | ICD-10-CM | POA: Diagnosis not present

## 2020-04-22 DIAGNOSIS — J9611 Chronic respiratory failure with hypoxia: Secondary | ICD-10-CM | POA: Diagnosis not present

## 2020-04-22 DIAGNOSIS — M80052D Age-related osteoporosis with current pathological fracture, left femur, subsequent encounter for fracture with routine healing: Secondary | ICD-10-CM | POA: Diagnosis not present

## 2020-04-22 DIAGNOSIS — D509 Iron deficiency anemia, unspecified: Secondary | ICD-10-CM | POA: Diagnosis not present

## 2020-04-22 DIAGNOSIS — J449 Chronic obstructive pulmonary disease, unspecified: Secondary | ICD-10-CM | POA: Diagnosis not present

## 2020-04-25 DIAGNOSIS — M80052D Age-related osteoporosis with current pathological fracture, left femur, subsequent encounter for fracture with routine healing: Secondary | ICD-10-CM | POA: Diagnosis not present

## 2020-04-25 DIAGNOSIS — M069 Rheumatoid arthritis, unspecified: Secondary | ICD-10-CM | POA: Diagnosis not present

## 2020-04-25 DIAGNOSIS — W19XXXD Unspecified fall, subsequent encounter: Secondary | ICD-10-CM | POA: Diagnosis not present

## 2020-04-25 DIAGNOSIS — D509 Iron deficiency anemia, unspecified: Secondary | ICD-10-CM | POA: Diagnosis not present

## 2020-04-25 DIAGNOSIS — J9611 Chronic respiratory failure with hypoxia: Secondary | ICD-10-CM | POA: Diagnosis not present

## 2020-04-25 DIAGNOSIS — J449 Chronic obstructive pulmonary disease, unspecified: Secondary | ICD-10-CM | POA: Diagnosis not present

## 2020-04-29 DIAGNOSIS — S72145D Nondisplaced intertrochanteric fracture of left femur, subsequent encounter for closed fracture with routine healing: Secondary | ICD-10-CM | POA: Diagnosis not present

## 2020-04-29 DIAGNOSIS — M7061 Trochanteric bursitis, right hip: Secondary | ICD-10-CM | POA: Diagnosis not present

## 2020-04-29 DIAGNOSIS — J449 Chronic obstructive pulmonary disease, unspecified: Secondary | ICD-10-CM | POA: Diagnosis not present

## 2020-04-29 DIAGNOSIS — M069 Rheumatoid arthritis, unspecified: Secondary | ICD-10-CM | POA: Diagnosis not present

## 2020-04-29 DIAGNOSIS — W19XXXD Unspecified fall, subsequent encounter: Secondary | ICD-10-CM | POA: Diagnosis not present

## 2020-04-29 DIAGNOSIS — J9611 Chronic respiratory failure with hypoxia: Secondary | ICD-10-CM | POA: Diagnosis not present

## 2020-04-29 DIAGNOSIS — D509 Iron deficiency anemia, unspecified: Secondary | ICD-10-CM | POA: Diagnosis not present

## 2020-04-29 DIAGNOSIS — M80052D Age-related osteoporosis with current pathological fracture, left femur, subsequent encounter for fracture with routine healing: Secondary | ICD-10-CM | POA: Diagnosis not present

## 2020-05-01 DIAGNOSIS — J449 Chronic obstructive pulmonary disease, unspecified: Secondary | ICD-10-CM | POA: Diagnosis not present

## 2020-05-01 DIAGNOSIS — J9611 Chronic respiratory failure with hypoxia: Secondary | ICD-10-CM | POA: Diagnosis not present

## 2020-05-01 DIAGNOSIS — D509 Iron deficiency anemia, unspecified: Secondary | ICD-10-CM | POA: Diagnosis not present

## 2020-05-01 DIAGNOSIS — M80052D Age-related osteoporosis with current pathological fracture, left femur, subsequent encounter for fracture with routine healing: Secondary | ICD-10-CM | POA: Diagnosis not present

## 2020-05-01 DIAGNOSIS — W19XXXD Unspecified fall, subsequent encounter: Secondary | ICD-10-CM | POA: Diagnosis not present

## 2020-05-01 DIAGNOSIS — M069 Rheumatoid arthritis, unspecified: Secondary | ICD-10-CM | POA: Diagnosis not present

## 2020-05-02 DIAGNOSIS — W19XXXD Unspecified fall, subsequent encounter: Secondary | ICD-10-CM | POA: Diagnosis not present

## 2020-05-02 DIAGNOSIS — J449 Chronic obstructive pulmonary disease, unspecified: Secondary | ICD-10-CM | POA: Diagnosis not present

## 2020-05-02 DIAGNOSIS — J9611 Chronic respiratory failure with hypoxia: Secondary | ICD-10-CM | POA: Diagnosis not present

## 2020-05-02 DIAGNOSIS — D509 Iron deficiency anemia, unspecified: Secondary | ICD-10-CM | POA: Diagnosis not present

## 2020-05-02 DIAGNOSIS — M80052D Age-related osteoporosis with current pathological fracture, left femur, subsequent encounter for fracture with routine healing: Secondary | ICD-10-CM | POA: Diagnosis not present

## 2020-05-02 DIAGNOSIS — M069 Rheumatoid arthritis, unspecified: Secondary | ICD-10-CM | POA: Diagnosis not present

## 2020-05-03 DIAGNOSIS — W19XXXD Unspecified fall, subsequent encounter: Secondary | ICD-10-CM | POA: Diagnosis not present

## 2020-05-03 DIAGNOSIS — J449 Chronic obstructive pulmonary disease, unspecified: Secondary | ICD-10-CM | POA: Diagnosis not present

## 2020-05-03 DIAGNOSIS — M069 Rheumatoid arthritis, unspecified: Secondary | ICD-10-CM | POA: Diagnosis not present

## 2020-05-03 DIAGNOSIS — J9611 Chronic respiratory failure with hypoxia: Secondary | ICD-10-CM | POA: Diagnosis not present

## 2020-05-03 DIAGNOSIS — M80052D Age-related osteoporosis with current pathological fracture, left femur, subsequent encounter for fracture with routine healing: Secondary | ICD-10-CM | POA: Diagnosis not present

## 2020-05-03 DIAGNOSIS — D509 Iron deficiency anemia, unspecified: Secondary | ICD-10-CM | POA: Diagnosis not present

## 2020-05-07 DIAGNOSIS — J9611 Chronic respiratory failure with hypoxia: Secondary | ICD-10-CM | POA: Diagnosis not present

## 2020-05-07 DIAGNOSIS — W19XXXD Unspecified fall, subsequent encounter: Secondary | ICD-10-CM | POA: Diagnosis not present

## 2020-05-07 DIAGNOSIS — D509 Iron deficiency anemia, unspecified: Secondary | ICD-10-CM | POA: Diagnosis not present

## 2020-05-07 DIAGNOSIS — M069 Rheumatoid arthritis, unspecified: Secondary | ICD-10-CM | POA: Diagnosis not present

## 2020-05-07 DIAGNOSIS — J449 Chronic obstructive pulmonary disease, unspecified: Secondary | ICD-10-CM | POA: Diagnosis not present

## 2020-05-07 DIAGNOSIS — M80052D Age-related osteoporosis with current pathological fracture, left femur, subsequent encounter for fracture with routine healing: Secondary | ICD-10-CM | POA: Diagnosis not present

## 2020-05-10 DIAGNOSIS — Z7951 Long term (current) use of inhaled steroids: Secondary | ICD-10-CM | POA: Diagnosis not present

## 2020-05-10 DIAGNOSIS — R1310 Dysphagia, unspecified: Secondary | ICD-10-CM | POA: Diagnosis not present

## 2020-05-10 DIAGNOSIS — M069 Rheumatoid arthritis, unspecified: Secondary | ICD-10-CM | POA: Diagnosis not present

## 2020-05-10 DIAGNOSIS — E213 Hyperparathyroidism, unspecified: Secondary | ICD-10-CM | POA: Diagnosis not present

## 2020-05-10 DIAGNOSIS — F17210 Nicotine dependence, cigarettes, uncomplicated: Secondary | ICD-10-CM | POA: Diagnosis not present

## 2020-05-10 DIAGNOSIS — J9611 Chronic respiratory failure with hypoxia: Secondary | ICD-10-CM | POA: Diagnosis not present

## 2020-05-10 DIAGNOSIS — M80052D Age-related osteoporosis with current pathological fracture, left femur, subsequent encounter for fracture with routine healing: Secondary | ICD-10-CM | POA: Diagnosis not present

## 2020-05-10 DIAGNOSIS — Z4800 Encounter for change or removal of nonsurgical wound dressing: Secondary | ICD-10-CM | POA: Diagnosis not present

## 2020-05-10 DIAGNOSIS — J449 Chronic obstructive pulmonary disease, unspecified: Secondary | ICD-10-CM | POA: Diagnosis not present

## 2020-05-10 DIAGNOSIS — D509 Iron deficiency anemia, unspecified: Secondary | ICD-10-CM | POA: Diagnosis not present

## 2020-05-10 DIAGNOSIS — E43 Unspecified severe protein-calorie malnutrition: Secondary | ICD-10-CM | POA: Diagnosis not present

## 2020-05-10 DIAGNOSIS — W19XXXD Unspecified fall, subsequent encounter: Secondary | ICD-10-CM | POA: Diagnosis not present

## 2020-05-10 DIAGNOSIS — I10 Essential (primary) hypertension: Secondary | ICD-10-CM | POA: Diagnosis not present

## 2020-05-10 DIAGNOSIS — Z9181 History of falling: Secondary | ICD-10-CM | POA: Diagnosis not present

## 2020-05-13 ENCOUNTER — Other Ambulatory Visit: Payer: Self-pay | Admitting: Orthopedic Surgery

## 2020-05-13 DIAGNOSIS — M7061 Trochanteric bursitis, right hip: Secondary | ICD-10-CM | POA: Diagnosis not present

## 2020-05-14 DIAGNOSIS — M25551 Pain in right hip: Secondary | ICD-10-CM | POA: Diagnosis not present

## 2020-05-16 DIAGNOSIS — J9611 Chronic respiratory failure with hypoxia: Secondary | ICD-10-CM | POA: Diagnosis not present

## 2020-05-16 DIAGNOSIS — D509 Iron deficiency anemia, unspecified: Secondary | ICD-10-CM | POA: Diagnosis not present

## 2020-05-16 DIAGNOSIS — M069 Rheumatoid arthritis, unspecified: Secondary | ICD-10-CM | POA: Diagnosis not present

## 2020-05-16 DIAGNOSIS — J449 Chronic obstructive pulmonary disease, unspecified: Secondary | ICD-10-CM | POA: Diagnosis not present

## 2020-05-16 DIAGNOSIS — W19XXXD Unspecified fall, subsequent encounter: Secondary | ICD-10-CM | POA: Diagnosis not present

## 2020-05-16 DIAGNOSIS — M80052D Age-related osteoporosis with current pathological fracture, left femur, subsequent encounter for fracture with routine healing: Secondary | ICD-10-CM | POA: Diagnosis not present

## 2020-05-20 ENCOUNTER — Other Ambulatory Visit: Payer: Self-pay

## 2020-05-20 ENCOUNTER — Ambulatory Visit (INDEPENDENT_AMBULATORY_CARE_PROVIDER_SITE_OTHER): Payer: Medicare Other | Admitting: Pulmonary Disease

## 2020-05-20 ENCOUNTER — Encounter: Payer: Self-pay | Admitting: Pulmonary Disease

## 2020-05-20 VITALS — BP 120/62 | HR 85 | Temp 97.3°F | Ht 61.0 in | Wt 76.8 lb

## 2020-05-20 DIAGNOSIS — A319 Mycobacterial infection, unspecified: Secondary | ICD-10-CM

## 2020-05-20 DIAGNOSIS — R64 Cachexia: Secondary | ICD-10-CM | POA: Diagnosis not present

## 2020-05-20 DIAGNOSIS — A318 Other mycobacterial infections: Secondary | ICD-10-CM

## 2020-05-20 DIAGNOSIS — J432 Centrilobular emphysema: Secondary | ICD-10-CM

## 2020-05-20 NOTE — Patient Instructions (Signed)
Follow up in 6 months 

## 2020-05-20 NOTE — Progress Notes (Signed)
[facility]  Chief Complaint  Patient presents with  . Follow-up    8 week f/u. States she has been doing well breathing wise since last visit. Completed CT scan last month.     Constitutional:  BP 120/62   Pulse 85   Temp (!) 97.3 F (36.3 C) (Temporal)   Ht 5\' 1"  (1.549 m)   Wt 76 lb 12.8 oz (34.8 kg)   SpO2 94% Comment: on RA  BMI 14.51 kg/m   Past Medical History:  Vertigo, RA, Mycobacteria abscessus lung infection, Breast cancer on Lt, Hyperparathyroidism, HLD, Weight loss, E cloacae bacteremia  Past Surgical History:  Her  has a past surgical history that includes Vesicovaginal fistula closure w/ TAH; Carpal tunnel release; Cataract extraction; Mastectomy (Left); Video bronchoscopy with endobronchial ultrasound (N/A, 01/18/2019); IR IMAGING GUIDED PORT INSERTION (04/05/2019); Femur IM nail (Left, 01/05/2020); and IR REMOVAL TUN ACCESS W/ PORT W/O FL MOD SED (02/08/2020).  Brief Summary:  Olivia Hooper. Fusaro is a 80 y.o. 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         Subjective:   She is here with her daughter.  She has cough with clear sputum, mostly in the morning.  Not having wheeze, fever, chest pain, or hemoptysis.  Having trouble keeping up her weight since she broke her leg.  CT chest from February shows some increase in size of cavity in Rt upper lung.  Physical Exam:   Appearance - thin  ENMT - no sinus tenderness, no oral exudate, no LAN, Mallampati 2 airway, no stridor  Respiratory - decreased breath sounds bilaterally, scattered rhonchi, no wheezing  CV - s1s2 regular rate and rhythm, no murmurs  Ext - no clubbing, no edema, decreased muscle bulk  Skin - no rashes  Psych - normal mood and affect    Pulmonary testing:   PFT 07/20/14 >> FEV1 1.41 (73%), FEV1% 63, TLC 4.46 (93%), DLCO 62%  Chest Imaging:   CT chest 09/14/19 >>  cavitation RUL 4.6 x 2.8 cm decreased compared to February 2021  CT chest 04/09/20 >> RUL cavity 5.2 x 4.1 cm, 0.9 cm nodule Rt medial lung base  Cardiac Tests:   Echo 05/18/19 >> EF 60 to 65%, grade 1 DD, mod elevation in PASP, mod LA dilation  Social History:  She  reports that she quit smoking about 4 months ago. Her smoking use included cigarettes. She has a 31.00 pack-year smoking history. She has never used smokeless tobacco. She reports current alcohol use. She reports that she does not use drugs.  Family History:  Her family history includes Bone cancer in her father; Colon cancer in her mother; Emphysema in her father; Heart disease in her father and mother; Kidney cancer in her brother; Melanoma in her sister.     Assessment/Plan:   COPD with emphysema. - d/c'ed ICS in setting of lung abscess - continue stiolto - prn albuterol  Mycobacteria abscessus lung infection. - has follow up appointment with Dr. Tommy Medal with ID later this month - has follow up CT chest through Dr. Roxan Hockey with thoracic surgery in August 2022 - she had a terrible time with prior treatment, and is very reluctant to consider treatment again  Rheumatoid arthritis. - used prednisone prn - followed by Dr. Dossie Der with [facility]  Time Spent Involved in Patient Care on Day of Examination:  22 minutes  Follow up:  Patient  Instructions  Follow up in 6 months   Medication List:   Allergies as of 05/20/2020      Reactions   Buprenorphine Hcl Nausea And Vomiting   Morphine And Related Nausea And Vomiting         Medication List       Accurate as of May 20, 2020 11:31 AM. If you have any questions, ask your nurse or doctor.        STOP taking these medications   albuterol 108 (90 Base) MCG/ACT inhaler Commonly known as: VENTOLIN HFA Stopped by: Chesley Mires, MD   pseudoephedrine-guaifenesin 60-600 MG 12 hr tablet Commonly known as: MUCINEX D Stopped by: Chesley Mires, MD     TAKE these medications   acetaminophen 500 MG tablet Commonly known as: TYLENOL Take 1,000 mg by mouth every 8 (eight) hours as needed for mild pain.   ascorbic acid 500 MG tablet Commonly known as: VITAMIN C Take 1 tablet (500 mg total) by mouth 2 (two) times daily.   cholecalciferol 25 MCG (1000 UNIT) tablet Commonly known as: VITAMIN D3 Take 1,000 Units by mouth daily.   docusate sodium 100 MG capsule Commonly known as: COLACE Take 1 capsule (100 mg total) by mouth 2 (two) times daily.   ferrous sulfate 325 (65 FE) MG tablet Take 1 tablet (325 mg total) by mouth 2 (two) times daily with a meal.   Ipratropium-Albuterol 20-100 MCG/ACT Aers respimat Commonly known as: COMBIVENT Inhale 2 puffs into the lungs every 6 (six) hours.   PROBIOTIC ACIDOPHILUS PO Take 1 tablet by mouth daily.   Stiolto Respimat 2.5-2.5 MCG/ACT Aers Generic drug: Tiotropium Bromide-Olodaterol Inhale 2 puffs into the lungs daily.   traMADol 50 MG tablet Commonly known as: ULTRAM Take 50 mg by mouth 2 (two) times daily as needed.   zinc gluconate 50 MG tablet Take 50 mg by mouth daily.       Signature:  Chesley Mires, MD [place] Pager - [phone] 05/20/2020, 11:31 AM

## 2020-05-21 DIAGNOSIS — M80052D Age-related osteoporosis with current pathological fracture, left femur, subsequent encounter for fracture with routine healing: Secondary | ICD-10-CM | POA: Diagnosis not present

## 2020-05-21 DIAGNOSIS — W19XXXD Unspecified fall, subsequent encounter: Secondary | ICD-10-CM | POA: Diagnosis not present

## 2020-05-21 DIAGNOSIS — D509 Iron deficiency anemia, unspecified: Secondary | ICD-10-CM | POA: Diagnosis not present

## 2020-05-21 DIAGNOSIS — J449 Chronic obstructive pulmonary disease, unspecified: Secondary | ICD-10-CM | POA: Diagnosis not present

## 2020-05-21 DIAGNOSIS — J9611 Chronic respiratory failure with hypoxia: Secondary | ICD-10-CM | POA: Diagnosis not present

## 2020-05-21 DIAGNOSIS — M069 Rheumatoid arthritis, unspecified: Secondary | ICD-10-CM | POA: Diagnosis not present

## 2020-05-22 DIAGNOSIS — M80052D Age-related osteoporosis with current pathological fracture, left femur, subsequent encounter for fracture with routine healing: Secondary | ICD-10-CM | POA: Diagnosis not present

## 2020-05-22 DIAGNOSIS — W19XXXD Unspecified fall, subsequent encounter: Secondary | ICD-10-CM | POA: Diagnosis not present

## 2020-05-22 DIAGNOSIS — M069 Rheumatoid arthritis, unspecified: Secondary | ICD-10-CM | POA: Diagnosis not present

## 2020-05-22 DIAGNOSIS — J449 Chronic obstructive pulmonary disease, unspecified: Secondary | ICD-10-CM | POA: Diagnosis not present

## 2020-05-22 DIAGNOSIS — J9611 Chronic respiratory failure with hypoxia: Secondary | ICD-10-CM | POA: Diagnosis not present

## 2020-05-22 DIAGNOSIS — D509 Iron deficiency anemia, unspecified: Secondary | ICD-10-CM | POA: Diagnosis not present

## 2020-05-22 DIAGNOSIS — M1611 Unilateral primary osteoarthritis, right hip: Secondary | ICD-10-CM | POA: Diagnosis not present

## 2020-05-23 DIAGNOSIS — J9611 Chronic respiratory failure with hypoxia: Secondary | ICD-10-CM | POA: Diagnosis not present

## 2020-05-23 DIAGNOSIS — J449 Chronic obstructive pulmonary disease, unspecified: Secondary | ICD-10-CM | POA: Diagnosis not present

## 2020-05-23 DIAGNOSIS — D509 Iron deficiency anemia, unspecified: Secondary | ICD-10-CM | POA: Diagnosis not present

## 2020-05-23 DIAGNOSIS — M80052D Age-related osteoporosis with current pathological fracture, left femur, subsequent encounter for fracture with routine healing: Secondary | ICD-10-CM | POA: Diagnosis not present

## 2020-05-23 DIAGNOSIS — W19XXXD Unspecified fall, subsequent encounter: Secondary | ICD-10-CM | POA: Diagnosis not present

## 2020-05-23 DIAGNOSIS — M069 Rheumatoid arthritis, unspecified: Secondary | ICD-10-CM | POA: Diagnosis not present

## 2020-05-28 DIAGNOSIS — J9611 Chronic respiratory failure with hypoxia: Secondary | ICD-10-CM | POA: Diagnosis not present

## 2020-05-28 DIAGNOSIS — D509 Iron deficiency anemia, unspecified: Secondary | ICD-10-CM | POA: Diagnosis not present

## 2020-05-28 DIAGNOSIS — M069 Rheumatoid arthritis, unspecified: Secondary | ICD-10-CM | POA: Diagnosis not present

## 2020-05-28 DIAGNOSIS — J449 Chronic obstructive pulmonary disease, unspecified: Secondary | ICD-10-CM | POA: Diagnosis not present

## 2020-05-28 DIAGNOSIS — W19XXXD Unspecified fall, subsequent encounter: Secondary | ICD-10-CM | POA: Diagnosis not present

## 2020-05-28 DIAGNOSIS — M80052D Age-related osteoporosis with current pathological fracture, left femur, subsequent encounter for fracture with routine healing: Secondary | ICD-10-CM | POA: Diagnosis not present

## 2020-05-30 DIAGNOSIS — J9611 Chronic respiratory failure with hypoxia: Secondary | ICD-10-CM | POA: Diagnosis not present

## 2020-05-30 DIAGNOSIS — M069 Rheumatoid arthritis, unspecified: Secondary | ICD-10-CM | POA: Diagnosis not present

## 2020-05-30 DIAGNOSIS — M80052D Age-related osteoporosis with current pathological fracture, left femur, subsequent encounter for fracture with routine healing: Secondary | ICD-10-CM | POA: Diagnosis not present

## 2020-05-30 DIAGNOSIS — W19XXXD Unspecified fall, subsequent encounter: Secondary | ICD-10-CM | POA: Diagnosis not present

## 2020-05-30 DIAGNOSIS — J449 Chronic obstructive pulmonary disease, unspecified: Secondary | ICD-10-CM | POA: Diagnosis not present

## 2020-05-30 DIAGNOSIS — D509 Iron deficiency anemia, unspecified: Secondary | ICD-10-CM | POA: Diagnosis not present

## 2020-06-04 ENCOUNTER — Other Ambulatory Visit: Payer: Self-pay

## 2020-06-04 ENCOUNTER — Encounter: Payer: Self-pay | Admitting: Podiatry

## 2020-06-04 ENCOUNTER — Other Ambulatory Visit: Payer: Self-pay | Admitting: Podiatry

## 2020-06-04 ENCOUNTER — Encounter: Payer: Self-pay | Admitting: Infectious Disease

## 2020-06-04 ENCOUNTER — Ambulatory Visit (INDEPENDENT_AMBULATORY_CARE_PROVIDER_SITE_OTHER): Payer: Medicare Other | Admitting: Infectious Disease

## 2020-06-04 ENCOUNTER — Telehealth: Payer: Self-pay | Admitting: Podiatry

## 2020-06-04 VITALS — BP 114/72 | HR 90 | Temp 97.6°F

## 2020-06-04 DIAGNOSIS — M1611 Unilateral primary osteoarthritis, right hip: Secondary | ICD-10-CM | POA: Diagnosis not present

## 2020-06-04 DIAGNOSIS — T80211D Bloodstream infection due to central venous catheter, subsequent encounter: Secondary | ICD-10-CM

## 2020-06-04 DIAGNOSIS — A310 Pulmonary mycobacterial infection: Secondary | ICD-10-CM

## 2020-06-04 DIAGNOSIS — J449 Chronic obstructive pulmonary disease, unspecified: Secondary | ICD-10-CM

## 2020-06-04 DIAGNOSIS — L659 Nonscarring hair loss, unspecified: Secondary | ICD-10-CM

## 2020-06-04 DIAGNOSIS — B9689 Other specified bacterial agents as the cause of diseases classified elsewhere: Secondary | ICD-10-CM

## 2020-06-04 DIAGNOSIS — R7881 Bacteremia: Secondary | ICD-10-CM | POA: Diagnosis not present

## 2020-06-04 DIAGNOSIS — R630 Anorexia: Secondary | ICD-10-CM | POA: Diagnosis not present

## 2020-06-04 MED ORDER — CICLOPIROX 8 % EX SOLN: Freq: Every day | CUTANEOUS | 0 refills | Status: AC

## 2020-06-04 NOTE — Telephone Encounter (Signed)
Patient called and stated she broke her left leg and she is in a wheelchair. She is having some issues with her toenails. Patient states it looks like she has some fungus. Patient would like a call back from Dr. Jacqualyn Posey.

## 2020-06-04 NOTE — Telephone Encounter (Signed)
I have returned her call

## 2020-06-04 NOTE — Progress Notes (Signed)
Subjective:  Chief complaint is hip pain  Patient ID: Olivia Hooper, female    DOB: [birth year], 80 y.o.   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 She suffered fall with broken hip sp IM nail on October 29th, 2021. HOsptial course complicated by E cloacae bacteremia thought to be due to PNA. She was treated with IV cefepime with what should have by now been 10 day course. Port not thought to be infected. She was residing at [facility] but is been discharged from there.  I did a video visit with her at [facility] and voiced my concern about the port being seeded by her bloodstream infection.  Additionally looking through things her cultures popped positive when they were taken on the first was only a few days after admission.  Talking to her and her son today the patient had been more confused prior to her fall and I wonder if her fall was due to the bloodstream infection.  Had the port removed subsequently and the site seems well-healed.  We did check surveillance blood cultures at my last visit and they were negative.  On the whole she feels dramatically better off of the medications that we had her on in particular the IV cefoxitin.   She gained some of her weight back although she is now losing a little bit of weight.  Her hair loss still does happen but not nearly at the levels of hair loss she was experiencing previously.  CT scan done in February showed interval increase in the size of her thick-walled cavity posterior upper lobe which now measures 5.2 x 4.1 cm with a new subpleural nodule the right medial lung base 0.9 x 0.5 cm with other nodules and consolidations having diminished.  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  She is more recently experienced some hip pain which was thought to be a fracture but she says is actually not a fracture but osteoarthritis that was found on MRI when seen by Dr. Lyla Glassing at [facility]    Past Medical History:  Diagnosis Date  . Anemia 05/03/2019  . Anorexia 10/23/2019  . Bloodstream infection due to Port-A-Cath 02/27/2020  . Breast cancer ([place])    on left  . Carpal tunnel syndrome   . COPD (chronic obstructive pulmonary disease) ([place])   . Drug-induced anemia 05/03/2019  . Dyspnea   . Hair loss 10/23/2019  . Hypercalcemia   . Hyperlipidemia   . Hyperparathyroidism ([place]) 10/23/2019  . Osteoporosis   . Pneumonia   . Pulmonary mycobacterial infection ([place]) 03/14/2019  . RA (rheumatoid arthritis) ([place])   . Sleeping excessive 05/03/2019  . Vaccine counseling 10/23/2019  . Vertigo   . Vitamin D deficiency     Past Surgical History:  Procedure Laterality Date  . CARPAL TUNNEL RELEASE    . CATARACT EXTRACTION    . FEMUR IM NAIL Left 01/05/2020   Procedure: INTRAMEDULLARY (IM) NAIL LEFT  FEMORAL;  Surgeon: Rod Can, MD;  Location: [facility] ORS;  Service: Orthopedics;  Laterality: Left;  . IR IMAGING GUIDED PORT INSERTION  04/05/2019  . IR REMOVAL TUN ACCESS W/ PORT W/O FL MOD SED  02/08/2020  .  MASTECTOMY Left   . VESICOVAGINAL FISTULA CLOSURE W/ TAH    . VIDEO BRONCHOSCOPY WITH ENDOBRONCHIAL ULTRASOUND N/A 01/18/2019   Procedure: VIDEO BRONCHOSCOPY WITH ENDOBRONCHIAL ULTRASOUND;  Surgeon: Candee Furbish, MD;  Location: [facility] OR;  Service: Thoracic;  Laterality: N/A;    Family History  Problem Relation Age of Onset  . Heart disease Father   . Bone cancer Father   . Emphysema Father        never smoker  . Heart disease Mother   . Colon cancer Mother   . Melanoma Sister   . Kidney cancer Brother       Social History   Socioeconomic History  . Marital status: Widowed    Spouse name: Not on file  .  Number of children: 4  . Years of education: 12+  . Highest education level: Associate degree: occupational, Hotel manager, or vocational program  Occupational History  . Occupation: Retired   Tobacco Use  . Smoking status: Current Every Day Smoker    Packs/day: 0.50    Years: 62.00    Pack years: 31.00    Types: Cigarettes    Last attempt to quit: 01/04/2020    Years since quitting: 0.4  . Smokeless tobacco: Never Used  Vaping Use  . Vaping Use: Never used  Substance and Sexual Activity  . Alcohol use: Not Currently    Alcohol/week: 0.0 standard drinks    Comment: occ  . Drug use: No  . Sexual activity: Not on file  Other Topics Concern  . Not on file  Social History Narrative   Lives alone, 4 children, independent.   Social Determinants of Health   Financial Resource Strain: Not on file  Food Insecurity: Not on file  Transportation Needs: Not on file  Physical Activity: Not on file  Stress: Not on file  Social Connections: Not on file    Allergies  Allergen Reactions  . Buprenorphine Hcl Nausea And Vomiting  . Morphine And Related Nausea And Vomiting          Current Outpatient Medications:  .  acetaminophen (TYLENOL) 500 MG tablet, Take 1,000 mg by mouth every 8 (eight) hours as needed for mild pain. , Disp: , Rfl:  .  ascorbic acid (VITAMIN C) 500 MG tablet, Take 1 tablet (500 mg total) by mouth 2 (two) times daily., Disp: 30 tablet, Rfl: 0 .  cholecalciferol (VITAMIN D3) 25 MCG (1000 UNIT) tablet, Take 1,000 Units by mouth daily., Disp: , Rfl:  .  docusate sodium (COLACE) 100 MG capsule, Take 1 capsule (100 mg total) by mouth 2 (two) times daily., Disp: 10 capsule, Rfl: 0 .  ferrous sulfate 325 (65 FE) MG tablet, Take 1 tablet (325 mg total) by mouth 2 (two) times daily with a meal., Disp: 30 tablet, Rfl: 0 .  Ipratropium-Albuterol (COMBIVENT) 20-100 MCG/ACT AERS respimat, Inhale 2 puffs into the lungs every 6 (six) hours., Disp: 4 g, Rfl: 0 .  Lactobacillus  (PROBIOTIC ACIDOPHILUS PO), Take 1 tablet by mouth daily., Disp: , Rfl:  .  Tiotropium Bromide-Olodaterol (STIOLTO RESPIMAT) 2.5-2.5 MCG/ACT AERS, Inhale 2 puffs into the lungs daily., Disp: 1 each, Rfl: 5 .  traMADol (ULTRAM) 50 MG tablet, Take 50 mg by mouth 2 (two) times daily as needed., Disp: , Rfl:  .  zinc gluconate 50 MG tablet, Take 50 mg by mouth daily., Disp: , Rfl:    Review of Systems  Constitutional: Positive for unexpected weight change. Negative for activity change, appetite change,  chills, diaphoresis, fatigue and fever.  HENT: Negative for congestion, rhinorrhea, sinus pressure, sneezing, sore throat and trouble swallowing.   Eyes: Negative for photophobia and visual disturbance.  Respiratory: Positive for cough. Negative for chest tightness, shortness of breath, wheezing and stridor.   Cardiovascular: Negative for chest pain, palpitations and leg swelling.  Gastrointestinal: Negative for abdominal distention, abdominal pain, anal bleeding, blood in stool, constipation, diarrhea, nausea and vomiting.  Genitourinary: Negative for difficulty urinating, dysuria, flank pain and hematuria.  Musculoskeletal: Positive for arthralgias. Negative for back pain, gait problem, joint swelling and myalgias.  Skin: Negative for color change, pallor, rash and wound.  Neurological: Negative for dizziness, tremors, weakness and light-headedness.  Hematological: Negative for adenopathy. Does not bruise/bleed easily.  Psychiatric/Behavioral: Positive for dysphoric mood. Negative for agitation, behavioral problems, confusion, decreased concentration and sleep disturbance.       Objective:   Physical Exam Constitutional:      General: She is not in acute distress.    Appearance: She is well-developed. She is cachectic. She is not ill-appearing or diaphoretic.  HENT:     Head: Normocephalic and atraumatic.     Right Ear: Hearing and external ear normal.     Left Ear: Hearing and external ear  normal.     Nose: No nasal deformity or rhinorrhea.  Eyes:     General: No scleral icterus.    Extraocular Movements: Extraocular movements intact.     Conjunctiva/sclera: Conjunctivae normal.     Right eye: Right conjunctiva is not injected.     Left eye: Left conjunctiva is not injected.  Neck:     Vascular: No JVD.  Cardiovascular:     Rate and Rhythm: Normal rate and regular rhythm.     Heart sounds: Normal heart sounds, S1 normal and S2 normal. No murmur heard. No friction rub. No gallop.   Pulmonary:     Effort: Pulmonary effort is normal. No respiratory distress.     Breath sounds: No stridor. No wheezing or rhonchi.  Abdominal:     General: Bowel sounds are normal. There is no distension.     Palpations: Abdomen is soft.  Musculoskeletal:        General: Normal range of motion.     Right shoulder: Normal.     Left shoulder: Normal.     Cervical back: Normal range of motion and neck supple.     Right hip: Normal.     Left hip: Normal.     Right knee: Normal.     Left knee: Normal.  Lymphadenopathy:     Head:     Right side of head: No submandibular, preauricular or posterior auricular adenopathy.     Left side of head: No submandibular, preauricular or posterior auricular adenopathy.     Cervical: No cervical adenopathy.     Right cervical: No superficial or deep cervical adenopathy.    Left cervical: No superficial or deep cervical adenopathy.  Skin:    General: Skin is warm and dry.     Coloration: Skin is pale.     Findings: No abrasion, bruising, ecchymosis, erythema, lesion or rash.     Nails: There is no clubbing.  Neurological:     General: No focal deficit present.     Mental Status: She is alert and oriented to person, place, and time.     Sensory: No sensory deficit.     Coordination: Coordination normal.     Gait: Gait normal.  Psychiatric:  Attention and Perception: She is attentive.        Mood and Affect: Mood normal.        Speech: Speech  normal.        Behavior: Behavior normal. Behavior is cooperative.        Thought Content: Thought content normal.        Judgment: Judgment normal.           Assessment & Plan:   Mycobacterium abscessus pulmonary infection: The treatments we have tried for her have caused her to suffer more than not treating this infection.  She is not interested in having it treated this point in time.  She will call back if she needs my help.  He has multiple doctor's etc. present and her son and her do not want to make extra visits.  I did point out they could use a video visit if they needed to with [place] to help reduce need for transportation.  Right hip osteoarthritis: Seems to be a significant problem for her neck right now and she is seeing orthopedics  Weight loss and hair loss these are less dramatic than when she was on treatment for her nontuberculous mycobacteria

## 2020-06-05 DIAGNOSIS — W19XXXD Unspecified fall, subsequent encounter: Secondary | ICD-10-CM | POA: Diagnosis not present

## 2020-06-05 DIAGNOSIS — D509 Iron deficiency anemia, unspecified: Secondary | ICD-10-CM | POA: Diagnosis not present

## 2020-06-05 DIAGNOSIS — J449 Chronic obstructive pulmonary disease, unspecified: Secondary | ICD-10-CM | POA: Diagnosis not present

## 2020-06-05 DIAGNOSIS — M069 Rheumatoid arthritis, unspecified: Secondary | ICD-10-CM | POA: Diagnosis not present

## 2020-06-05 DIAGNOSIS — M25551 Pain in right hip: Secondary | ICD-10-CM | POA: Diagnosis not present

## 2020-06-05 DIAGNOSIS — J9611 Chronic respiratory failure with hypoxia: Secondary | ICD-10-CM | POA: Diagnosis not present

## 2020-06-05 DIAGNOSIS — M80052D Age-related osteoporosis with current pathological fracture, left femur, subsequent encounter for fracture with routine healing: Secondary | ICD-10-CM | POA: Diagnosis not present

## 2020-06-05 NOTE — Telephone Encounter (Signed)
Thank you :)

## 2020-06-07 DIAGNOSIS — J449 Chronic obstructive pulmonary disease, unspecified: Secondary | ICD-10-CM | POA: Diagnosis not present

## 2020-06-07 DIAGNOSIS — M069 Rheumatoid arthritis, unspecified: Secondary | ICD-10-CM | POA: Diagnosis not present

## 2020-06-07 DIAGNOSIS — W19XXXD Unspecified fall, subsequent encounter: Secondary | ICD-10-CM | POA: Diagnosis not present

## 2020-06-07 DIAGNOSIS — M80052D Age-related osteoporosis with current pathological fracture, left femur, subsequent encounter for fracture with routine healing: Secondary | ICD-10-CM | POA: Diagnosis not present

## 2020-06-07 DIAGNOSIS — J9611 Chronic respiratory failure with hypoxia: Secondary | ICD-10-CM | POA: Diagnosis not present

## 2020-06-07 DIAGNOSIS — D509 Iron deficiency anemia, unspecified: Secondary | ICD-10-CM | POA: Diagnosis not present

## 2020-06-14 DIAGNOSIS — M25551 Pain in right hip: Secondary | ICD-10-CM | POA: Diagnosis not present

## 2020-06-14 DIAGNOSIS — M5459 Other low back pain: Secondary | ICD-10-CM | POA: Diagnosis not present

## 2020-06-26 DIAGNOSIS — G8929 Other chronic pain: Secondary | ICD-10-CM | POA: Diagnosis not present

## 2020-06-26 DIAGNOSIS — R634 Abnormal weight loss: Secondary | ICD-10-CM | POA: Diagnosis not present

## 2020-06-26 DIAGNOSIS — L89153 Pressure ulcer of sacral region, stage 3: Secondary | ICD-10-CM | POA: Diagnosis not present

## 2020-06-26 DIAGNOSIS — M25552 Pain in left hip: Secondary | ICD-10-CM | POA: Diagnosis not present

## 2020-06-26 DIAGNOSIS — E440 Moderate protein-calorie malnutrition: Secondary | ICD-10-CM | POA: Diagnosis not present

## 2020-06-27 ENCOUNTER — Telehealth: Payer: Self-pay

## 2020-06-27 NOTE — Telephone Encounter (Signed)
Spoke with patient and scheduled an in-person Palliative Consult for 07/12/20 @ 9AM  COVID screening was negative. No pets in home. Patient's daughter lives with her.  Consent obtained; updated Outlook/Netsmart/Team List and Epic.  Patient is aware she may be receiving a call from NP the day before or day of to confirm appointment.

## 2020-07-02 DIAGNOSIS — M25552 Pain in left hip: Secondary | ICD-10-CM | POA: Diagnosis not present

## 2020-07-02 DIAGNOSIS — M069 Rheumatoid arthritis, unspecified: Secondary | ICD-10-CM | POA: Diagnosis not present

## 2020-07-02 DIAGNOSIS — Z9181 History of falling: Secondary | ICD-10-CM | POA: Diagnosis not present

## 2020-07-02 DIAGNOSIS — E440 Moderate protein-calorie malnutrition: Secondary | ICD-10-CM | POA: Diagnosis not present

## 2020-07-02 DIAGNOSIS — E559 Vitamin D deficiency, unspecified: Secondary | ICD-10-CM | POA: Diagnosis not present

## 2020-07-02 DIAGNOSIS — Z9012 Acquired absence of left breast and nipple: Secondary | ICD-10-CM | POA: Diagnosis not present

## 2020-07-02 DIAGNOSIS — L89153 Pressure ulcer of sacral region, stage 3: Secondary | ICD-10-CM | POA: Diagnosis not present

## 2020-07-02 DIAGNOSIS — J449 Chronic obstructive pulmonary disease, unspecified: Secondary | ICD-10-CM | POA: Diagnosis not present

## 2020-07-02 DIAGNOSIS — Z87891 Personal history of nicotine dependence: Secondary | ICD-10-CM | POA: Diagnosis not present

## 2020-07-02 DIAGNOSIS — M810 Age-related osteoporosis without current pathological fracture: Secondary | ICD-10-CM | POA: Diagnosis not present

## 2020-07-02 DIAGNOSIS — R42 Dizziness and giddiness: Secondary | ICD-10-CM | POA: Diagnosis not present

## 2020-07-02 DIAGNOSIS — Z853 Personal history of malignant neoplasm of breast: Secondary | ICD-10-CM | POA: Diagnosis not present

## 2020-07-02 DIAGNOSIS — G8929 Other chronic pain: Secondary | ICD-10-CM | POA: Diagnosis not present

## 2020-07-02 DIAGNOSIS — E785 Hyperlipidemia, unspecified: Secondary | ICD-10-CM | POA: Diagnosis not present

## 2020-07-08 DIAGNOSIS — M79604 Pain in right leg: Secondary | ICD-10-CM | POA: Diagnosis not present

## 2020-07-08 DIAGNOSIS — M545 Low back pain, unspecified: Secondary | ICD-10-CM | POA: Diagnosis not present

## 2020-07-08 DIAGNOSIS — M6281 Muscle weakness (generalized): Secondary | ICD-10-CM | POA: Diagnosis not present

## 2020-07-08 DIAGNOSIS — G894 Chronic pain syndrome: Secondary | ICD-10-CM | POA: Diagnosis not present

## 2020-07-09 DIAGNOSIS — L89153 Pressure ulcer of sacral region, stage 3: Secondary | ICD-10-CM | POA: Diagnosis not present

## 2020-07-09 DIAGNOSIS — M25552 Pain in left hip: Secondary | ICD-10-CM | POA: Diagnosis not present

## 2020-07-09 DIAGNOSIS — E440 Moderate protein-calorie malnutrition: Secondary | ICD-10-CM | POA: Diagnosis not present

## 2020-07-09 DIAGNOSIS — J449 Chronic obstructive pulmonary disease, unspecified: Secondary | ICD-10-CM | POA: Diagnosis not present

## 2020-07-09 DIAGNOSIS — G8929 Other chronic pain: Secondary | ICD-10-CM | POA: Diagnosis not present

## 2020-07-09 DIAGNOSIS — M069 Rheumatoid arthritis, unspecified: Secondary | ICD-10-CM | POA: Diagnosis not present

## 2020-07-10 DIAGNOSIS — G8929 Other chronic pain: Secondary | ICD-10-CM | POA: Diagnosis not present

## 2020-07-10 DIAGNOSIS — E440 Moderate protein-calorie malnutrition: Secondary | ICD-10-CM | POA: Diagnosis not present

## 2020-07-10 DIAGNOSIS — M069 Rheumatoid arthritis, unspecified: Secondary | ICD-10-CM | POA: Diagnosis not present

## 2020-07-10 DIAGNOSIS — J449 Chronic obstructive pulmonary disease, unspecified: Secondary | ICD-10-CM | POA: Diagnosis not present

## 2020-07-10 DIAGNOSIS — L89153 Pressure ulcer of sacral region, stage 3: Secondary | ICD-10-CM | POA: Diagnosis not present

## 2020-07-10 DIAGNOSIS — M25552 Pain in left hip: Secondary | ICD-10-CM | POA: Diagnosis not present

## 2020-07-11 DIAGNOSIS — E440 Moderate protein-calorie malnutrition: Secondary | ICD-10-CM | POA: Diagnosis not present

## 2020-07-11 DIAGNOSIS — J449 Chronic obstructive pulmonary disease, unspecified: Secondary | ICD-10-CM | POA: Diagnosis not present

## 2020-07-11 DIAGNOSIS — G8929 Other chronic pain: Secondary | ICD-10-CM | POA: Diagnosis not present

## 2020-07-11 DIAGNOSIS — M25552 Pain in left hip: Secondary | ICD-10-CM | POA: Diagnosis not present

## 2020-07-11 DIAGNOSIS — L89153 Pressure ulcer of sacral region, stage 3: Secondary | ICD-10-CM | POA: Diagnosis not present

## 2020-07-11 DIAGNOSIS — M069 Rheumatoid arthritis, unspecified: Secondary | ICD-10-CM | POA: Diagnosis not present

## 2020-07-12 ENCOUNTER — Other Ambulatory Visit: Payer: Medicare Other | Admitting: Nurse Practitioner

## 2020-07-12 ENCOUNTER — Other Ambulatory Visit: Payer: Self-pay

## 2020-07-12 DIAGNOSIS — R059 Cough, unspecified: Secondary | ICD-10-CM

## 2020-07-12 DIAGNOSIS — Z515 Encounter for palliative care: Secondary | ICD-10-CM

## 2020-07-12 DIAGNOSIS — G894 Chronic pain syndrome: Secondary | ICD-10-CM

## 2020-07-12 DIAGNOSIS — E43 Unspecified severe protein-calorie malnutrition: Secondary | ICD-10-CM | POA: Diagnosis not present

## 2020-07-12 NOTE — Progress Notes (Signed)
Consult Note Telephone: [phone]  Fax: [phone]    Date of encounter: 07/12/20 PATIENT NAME: Olivia Hooper [place] [id number]   [phone] (home)  DOB: 23-Nov-1940 MRN: [id number]  PRIMARY CARE PROVIDER:    [name], MD,  [place] [address] [phone]  REFERRING PROVIDER:   Jani Hooper, [place] [address] [place],  [place] [address] [phone]  RESPONSIBLE PARTY:    Contact Information    Name Relation Home Work Mobile   Self,Olivia Hooper Son [phone]  [phone]   Hooper,Olivia Daughter [phone]  [phone]   Olivia, Hooper [phone]  [phone]   Olivia, Hooper   [phone]     I met face to face with patient and family in home. Palliative Care was asked to follow this patient by consultation request of  Olivia Gravel, MD to address advance care planning and complex medical decision making. This is the initial visit.                                   ASSESSMENT AND PLAN / RECOMMENDATIONS:   Advance Care Planning/Goals of Care: Goals include to maximize quality of life and symptom management.  Today's visit consisted of building trust and discussions on Palliative care medicine as a specialized medical care for people living with serious illness, aimed at facilitating improved quality of life through symptoms relief, assisting with advance care planning and establishing goals of care. Patient expressed appreciation for education provided on Palliative care and how it differs from Hospice service. Patient report her husband was under Hospice care before his death. Our advance care planning conversation included a discussion about:     The value and importance of advance care planning   Experiences with loved ones who have been seriously ill or have died   Exploration of personal, cultural or spiritual beliefs that might  influence medical decisions   Exploration of goals of care in the event of a sudden injury or illness   Review and updating or creation of an  advance directive document .  CODE STATUS: Full code Goal: Patient's goal of care is function. She verbalized that her goal is to continue to improve with physical therapy and be able to walk independently with her walker.  Directives:  After discussions on ramifications and implications of establishing a Code status, patient elected to be a full Code. Patient reiterated desire for full spectrum of care including mechanical ventilation in the event of cardiac or respiratory arrest. She however report having a living will which defers decision about ongoing ventilatory support to her children. Validation provided. Patient to e-mail a copy of her living will for uploading to [facility].  It was also explained to patient that code status will be regularly reviewed to ensure that it reflects patient wishes. Patient verbalized understanding. Questions and concerns were addressed. Patient and family was encouraged to call with questions and/or concerns.  I spent 30 minutes providing this consultation. More than 50% of the time in this consultation was spent in counseling and care coordination. -------------------------------------------------------------------------------------  Symptom Management/Plan: Severe protein calorie malnutrition: BMI 13.4. Patient report fair appetite. Denied nausea or vomiting, denied chewing or swallowing issues. Encouraged adequate food intake, consuming food from a variety of food grous. Encouraged augmenting calorie intake with nutritional supplements like Ensure. Advised to make calorie dense  fruit smoothies with protein and vegetable as tolerated. Encouraged snacking between meals. Chronic pain: Chronic pain related rheumatoid arthritis is currently managed on Tramadol 69m four times a day. Patient report occassionally taking Tylenol  5024mand Advil as needed for breakthrough pain. Advised not to take more than 300055mf Tylenol in a 24hr period. Wound: Patient report pressure ulcer on her sacrum is followed by home health nursing for dressing changes.  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 Patient report moderate relieve with as needed Robitussin.  Mild Lymphedema: mild left arm lymphedema, more around her left elbow, related to breast cancer treatment more than 30 years ago. No fluid leaking noted, no redness or tenderness. Encouraged to elevate arm when sitting and avoid lying on the left arm. If worsening condition may consider the use of compression support for the left arm.  Provided general support and encouragement. Palliative care will continue to provide support to patient, family and medical team.  Follow up Palliative Care Visit: Palliative care will continue to follow for complex medical decision making, advance care planning, and clarification of goals. Return in about 4 weeks or prn.  PPS: 40%  HOSPICE ELIGIBILITY/DIAGNOSIS: TBD  Chief Complaint: weight loss  History obtained from review of Epic EMR, discussion with patient and her daughter.   HISTORY OF PRESENT ILLNESS:  Olivia Hooper a 79 13[age]. 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 Patient has severe deconditioning related to the above condition and severe protein calorie malnutrition with BMI of 13.4. Patient with chronic cough related to above problem. She is followed by Dr. [facility] infectious disease. Aggressive antibiotics for the mycobacterium was stopped due to patient's intolerance of regimen related to chronic diarrhea, weight loss and hairloss. Patient not currently on treatment for rheumatoid arthritis. Patient is depended on motorized wheel chair for ambulation. She currently receiving physical therapy to  improve function. She denied fever, denied chills, report improved pain control with current regimen of Tramadol.  CT chest completed on 04/09/2020 IMPRESSION: 1. Slight interval increase in size of a thick-walled cavitary lesion of the posterior right upper lobe and adjacent superior segment right lower lobe, greatest axial dimension 5.2 x 4.1 cm, previously 4.6 x 3.5 cm when measured similarly. 2. There is a new subpleural nodule of the medial right lung base measuring 0.9 x 0.5 cm. Other nodules and consolidations are diminished, for example a previously cavitary subpleural nodule of the anterior right upper lobe. 3. Fluctuant constellation of findings is consistent with ongoing atypical infection, particularly atypical mycobacterium. 4. Emphysema. 5. Coronary artery disease.    I reviewed available labs, medications, imaging, studies and related documents from the EMR.  Records reviewed and summarized above.   ROS General: NAD EYES: denies acute vision changes ENMT: denies dysphagia Cardiovascular: denies chest pain, denies DOE Pulmonary: Endorsed cough, denies increased SOB Abdomen: endorses fair appetite, denies constipation, endorses continence of bowel GU: denies dysuria, endorses continence of urine MSK:  endorsed weakness,  no falls reported Skin: report pressure ulcers Neurological: endorsed chronic pain, denies insomnia Psych: Endorses positive mood Heme/lymph/immuno: denies bruises, abnormal bleeding  Physical Exam: Current and past weights: 71lbs, Ht 48f 10f BMI 13.4kg/m2 General: chronically ill and frail appearing, cachetic, NAD  EYES: anicteric sclera, no discharge  ENMT: intact hearing, oral mucous membranes moist CV:  RRR, no LE edema Pulmonary:  no increased work of breathing, congested cough, room air Abdomen:  no ascites GU: deferred MSK: severe sarcopenia, swan neck deformity to bilateral hands, moves all extremities, ambulatory Skin: warm and dry,  skin thin and fragile Neuro: generalized weakness,  no cognitive impairment Psych: non-anxious affect, A and O x 4 Hem/lymph/immuno: no widespread bruising  CURRENT PROBLEM LIST:  Patient Active Problem List   Diagnosis Date Noted  . Bloodstream infection due to Port-A-Cath 02/27/2020  . Acute respiratory failure with hypoxia ([place])   . Bacteremia due to Enterobacter species   . Lobar pneumonia, unspecified organism ([place])   . Protein-calorie malnutrition, severe 01/08/2020  . Closed left hip fracture, initial encounter ([facility]) 01/04/2020  . Hair loss 10/23/2019  . Hyperparathyroidism ([facility]) 10/23/2019  . Anorexia 10/23/2019  . Vaccine counseling 10/23/2019  . Pressure injury of skin 05/19/2019  . COVID-19 virus infection 05/18/2019  . Symptomatic anemia 05/17/2019  . AKI (acute kidney injury) ([facility]) 05/17/2019  . Cardiomegaly 05/17/2019  . Anemia 05/03/2019  . Drug-induced anemia 05/03/2019  . Sleeping excessive 05/03/2019  . Pulmonary mycobacterial infection ([place]) 03/14/2019  . Cavitating mass in right upper lung lobe 01/31/2019  . S/P eye surgery 01/25/2015  . 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 Age-related macular degeneration, dry, both eyes 11/30/2014  . Pseudophakia of both eyes 11/30/2014  . Pulmonary nodules 03/18/2014  . Cigarette smoker 03/18/2014  . COPD GOLD II / still smoking  03/15/2014  . Profound fatigue 02/18/2011  . Encounter for long-term (current) use of other medications 01/08/2011  . Rheumatoid arthritis ([place]) 08/21/2009   PAST MEDICAL HISTORY:  Active Ambulatory Problems    Diagnosis Date Noted  . COPD GOLD II / still smoking  03/15/2014  . Pulmonary nodules 03/18/2014  . Cigarette smoker 03/18/2014  . 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 Age-related macular degeneration, dry, both eyes 11/30/2014  . Encounter for long-term (current) use of other  medications 01/08/2011  . Profound fatigue 02/18/2011  . Pseudophakia of both eyes 11/30/2014  . Rheumatoid arthritis ([place]) 08/21/2009  . S/P eye surgery 01/25/2015  . 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 Cavitating mass in right upper lung lobe 01/31/2019  . Pulmonary mycobacterial infection ([place]) 03/14/2019  . Anemia 05/03/2019  . Drug-induced anemia 05/03/2019  . Sleeping excessive 05/03/2019  . Symptomatic anemia 05/17/2019  . AKI (acute kidney injury) ([facility]) 05/17/2019  . Cardiomegaly 05/17/2019  . COVID-19 virus infection 05/18/2019  . Pressure injury of skin 05/19/2019  . Hair loss 10/23/2019  . Hyperparathyroidism ([facility]) 10/23/2019  . Anorexia 10/23/2019  . Vaccine counseling 10/23/2019  . Closed left hip fracture, initial encounter ([facility]) 01/04/2020  . Protein-calorie malnutrition, severe 01/08/2020  . Acute respiratory failure with hypoxia ([facility])   . Bacteremia due to Enterobacter species   . Lobar pneumonia, unspecified organism ([place])   . Bloodstream infection due to Port-A-Cath 02/27/2020   Resolved Ambulatory Problems    Diagnosis Date Noted  . No Resolved Ambulatory Problems   Past Medical History:  Diagnosis Date  . Breast cancer ([facility])   . Carpal tunnel syndrome   . COPD (chronic obstructive pulmonary disease) ([place])   . Dyspnea   . Hypercalcemia   . Hyperlipidemia   . Osteoporosis   . Pneumonia   . RA (rheumatoid arthritis) ([facility])   . Vertigo   . Vitamin D deficiency    SOCIAL HX:  Social History   Tobacco Use  . Smoking status: Current Every Day Smoker  Packs/day: 0.50    Years: 62.00    Pack years: 31.00    Types: Cigarettes    Last attempt to quit: 01/04/2020    Years since quitting: 0.5  . Smokeless tobacco: Never Used  Substance Use Topics  . Alcohol use: Not Currently    Alcohol/week: 0.0 standard drinks    Comment: occ   FAMILY HX:  Family History  Problem Relation Age of Onset  . Heart disease Father   . Bone  cancer Father   . Emphysema Father        never smoker  . Heart disease Mother   . Colon cancer Mother   . Melanoma Sister   . Kidney cancer Brother      ALLERGIES:  Allergies  Allergen Reactions  . Buprenorphine Hcl Nausea And Vomiting  . Morphine And Related Nausea And Vomiting          PERTINENT MEDICATIONS:  Outpatient Encounter Medications as of 07/12/2020  Medication Sig  . acetaminophen (TYLENOL) 500 MG tablet Take 1,000 mg by mouth every 8 (eight) hours as needed for mild pain.   Mar[place] Kitchen ascorbic acid (VITAMIN C) 500 MG tablet Take 1 tablet (500 mg total) by mouth 2 (two) times daily.  . cholecalciferol (VITAMIN D3) 25 MCG (1000 UNIT) tablet Take 1,000 Units by mouth daily.  . ciclopirox (PENLAC) 8 % solution Apply topically at bedtime. Apply over nail and surrounding skin. Apply daily over previous coat. After seven (7) days, may remove with alcohol and continue cycle.  . docusate sodium (COLACE) 100 MG capsule Take 1 capsule (100 mg total) by mouth 2 (two) times daily.  . ferrous sulfate 325 (65 FE) MG tablet Take 1 tablet (325 mg total) by mouth 2 (two) times daily with a meal.  . Ipratropium-Albuterol (COMBIVENT) 20-100 MCG/ACT AERS respimat Inhale 2 puffs into the lungs every 6 (six) hours.  . Lactobacillus (PROBIOTIC ACIDOPHILUS PO) Take 1 tablet by mouth daily.  . Tiotropium Bromide-Olodaterol (STIOLTO RESPIMAT) 2.5-2.5 MCG/ACT AERS Inhale 2 puffs into the lungs daily.  . traMADol (ULTRAM) 50 MG tablet Take 50 mg by mouth 2 (two) times daily as needed.  . zinc gluconate 50 MG tablet Take 50 mg by mouth daily.   No facility-administered encounter medications on file as of 07/12/2020.   Thank you for the opportunity to participate in the care of Ms. Parson.  The palliative care team will continue to follow. Please call our office at [phone] if we can be of additional assistance.   Jari Favre, DNP, AGPCNP-BC  COVID-19 PATIENT SCREENING TOOL Asked and negative  response unless otherwise noted:   Have you had symptoms of covid, tested positive or been in contact with someone with symptoms/positive test in the past 5-10 days?

## 2020-07-15 DIAGNOSIS — J449 Chronic obstructive pulmonary disease, unspecified: Secondary | ICD-10-CM | POA: Diagnosis not present

## 2020-07-15 DIAGNOSIS — G8929 Other chronic pain: Secondary | ICD-10-CM | POA: Diagnosis not present

## 2020-07-15 DIAGNOSIS — E440 Moderate protein-calorie malnutrition: Secondary | ICD-10-CM | POA: Diagnosis not present

## 2020-07-15 DIAGNOSIS — M25552 Pain in left hip: Secondary | ICD-10-CM | POA: Diagnosis not present

## 2020-07-15 DIAGNOSIS — L89153 Pressure ulcer of sacral region, stage 3: Secondary | ICD-10-CM | POA: Diagnosis not present

## 2020-07-15 DIAGNOSIS — M069 Rheumatoid arthritis, unspecified: Secondary | ICD-10-CM | POA: Diagnosis not present

## 2020-07-16 DIAGNOSIS — M069 Rheumatoid arthritis, unspecified: Secondary | ICD-10-CM | POA: Diagnosis not present

## 2020-07-16 DIAGNOSIS — G8929 Other chronic pain: Secondary | ICD-10-CM | POA: Diagnosis not present

## 2020-07-16 DIAGNOSIS — J449 Chronic obstructive pulmonary disease, unspecified: Secondary | ICD-10-CM | POA: Diagnosis not present

## 2020-07-16 DIAGNOSIS — M25552 Pain in left hip: Secondary | ICD-10-CM | POA: Diagnosis not present

## 2020-07-16 DIAGNOSIS — L89153 Pressure ulcer of sacral region, stage 3: Secondary | ICD-10-CM | POA: Diagnosis not present

## 2020-07-16 DIAGNOSIS — E440 Moderate protein-calorie malnutrition: Secondary | ICD-10-CM | POA: Diagnosis not present

## 2020-07-17 DIAGNOSIS — M069 Rheumatoid arthritis, unspecified: Secondary | ICD-10-CM | POA: Diagnosis not present

## 2020-07-17 DIAGNOSIS — M25552 Pain in left hip: Secondary | ICD-10-CM | POA: Diagnosis not present

## 2020-07-17 DIAGNOSIS — E440 Moderate protein-calorie malnutrition: Secondary | ICD-10-CM | POA: Diagnosis not present

## 2020-07-17 DIAGNOSIS — J449 Chronic obstructive pulmonary disease, unspecified: Secondary | ICD-10-CM | POA: Diagnosis not present

## 2020-07-17 DIAGNOSIS — G8929 Other chronic pain: Secondary | ICD-10-CM | POA: Diagnosis not present

## 2020-07-17 DIAGNOSIS — L89153 Pressure ulcer of sacral region, stage 3: Secondary | ICD-10-CM | POA: Diagnosis not present

## 2020-07-22 DIAGNOSIS — M069 Rheumatoid arthritis, unspecified: Secondary | ICD-10-CM | POA: Diagnosis not present

## 2020-07-22 DIAGNOSIS — G8929 Other chronic pain: Secondary | ICD-10-CM | POA: Diagnosis not present

## 2020-07-22 DIAGNOSIS — J449 Chronic obstructive pulmonary disease, unspecified: Secondary | ICD-10-CM | POA: Diagnosis not present

## 2020-07-22 DIAGNOSIS — L89153 Pressure ulcer of sacral region, stage 3: Secondary | ICD-10-CM | POA: Diagnosis not present

## 2020-07-22 DIAGNOSIS — E440 Moderate protein-calorie malnutrition: Secondary | ICD-10-CM | POA: Diagnosis not present

## 2020-07-22 DIAGNOSIS — M25552 Pain in left hip: Secondary | ICD-10-CM | POA: Diagnosis not present

## 2020-07-23 DIAGNOSIS — L89153 Pressure ulcer of sacral region, stage 3: Secondary | ICD-10-CM | POA: Diagnosis not present

## 2020-07-23 DIAGNOSIS — J449 Chronic obstructive pulmonary disease, unspecified: Secondary | ICD-10-CM | POA: Diagnosis not present

## 2020-07-23 DIAGNOSIS — M25552 Pain in left hip: Secondary | ICD-10-CM | POA: Diagnosis not present

## 2020-07-23 DIAGNOSIS — E440 Moderate protein-calorie malnutrition: Secondary | ICD-10-CM | POA: Diagnosis not present

## 2020-07-23 DIAGNOSIS — M069 Rheumatoid arthritis, unspecified: Secondary | ICD-10-CM | POA: Diagnosis not present

## 2020-07-23 DIAGNOSIS — G8929 Other chronic pain: Secondary | ICD-10-CM | POA: Diagnosis not present

## 2020-07-24 DIAGNOSIS — E440 Moderate protein-calorie malnutrition: Secondary | ICD-10-CM | POA: Diagnosis not present

## 2020-07-24 DIAGNOSIS — M069 Rheumatoid arthritis, unspecified: Secondary | ICD-10-CM | POA: Diagnosis not present

## 2020-07-24 DIAGNOSIS — J449 Chronic obstructive pulmonary disease, unspecified: Secondary | ICD-10-CM | POA: Diagnosis not present

## 2020-07-24 DIAGNOSIS — M25552 Pain in left hip: Secondary | ICD-10-CM | POA: Diagnosis not present

## 2020-07-24 DIAGNOSIS — L89153 Pressure ulcer of sacral region, stage 3: Secondary | ICD-10-CM | POA: Diagnosis not present

## 2020-07-24 DIAGNOSIS — G8929 Other chronic pain: Secondary | ICD-10-CM | POA: Diagnosis not present

## 2020-07-29 DIAGNOSIS — J449 Chronic obstructive pulmonary disease, unspecified: Secondary | ICD-10-CM | POA: Diagnosis not present

## 2020-07-29 DIAGNOSIS — L89153 Pressure ulcer of sacral region, stage 3: Secondary | ICD-10-CM | POA: Diagnosis not present

## 2020-07-29 DIAGNOSIS — E440 Moderate protein-calorie malnutrition: Secondary | ICD-10-CM | POA: Diagnosis not present

## 2020-07-29 DIAGNOSIS — M069 Rheumatoid arthritis, unspecified: Secondary | ICD-10-CM | POA: Diagnosis not present

## 2020-07-29 DIAGNOSIS — M25552 Pain in left hip: Secondary | ICD-10-CM | POA: Diagnosis not present

## 2020-07-29 DIAGNOSIS — G8929 Other chronic pain: Secondary | ICD-10-CM | POA: Diagnosis not present

## 2020-07-30 DIAGNOSIS — R4020 Unspecified coma: Secondary | ICD-10-CM | POA: Diagnosis not present

## 2020-07-30 DIAGNOSIS — R404 Transient alteration of awareness: Secondary | ICD-10-CM | POA: Diagnosis not present

## 2020-07-30 DIAGNOSIS — R410 Disorientation, unspecified: Secondary | ICD-10-CM | POA: Diagnosis not present

## 2020-08-07 DIAGNOSIS — 419620001 Death: Secondary | SNOMED CT | POA: Diagnosis not present

## 2020-08-07 DEATH — deceased

## 2020-08-16 ENCOUNTER — Other Ambulatory Visit: Payer: Self-pay

## 2020-08-16 ENCOUNTER — Other Ambulatory Visit: Payer: Medicare Other | Admitting: Nurse Practitioner

## 2020-09-12 ENCOUNTER — Other Ambulatory Visit: Payer: Self-pay | Admitting: Thoracic Surgery (Cardiothoracic Vascular Surgery)

## 2020-09-12 DIAGNOSIS — R7881 Bacteremia: Secondary | ICD-10-CM

## 2020-09-12 DIAGNOSIS — J181 Lobar pneumonia, unspecified organism: Secondary | ICD-10-CM

## 2020-09-12 DIAGNOSIS — B9689 Other specified bacterial agents as the cause of diseases classified elsewhere: Secondary | ICD-10-CM

## 2020-10-24 ENCOUNTER — Inpatient Hospital Stay: Admission: RE | Admit: 2020-10-24 | Payer: Medicare Other | Source: Ambulatory Visit

## 2020-10-29 ENCOUNTER — Ambulatory Visit: Payer: Medicare Other | Admitting: Thoracic Surgery (Cardiothoracic Vascular Surgery)

## 2020-10-30 ENCOUNTER — Encounter: Payer: Self-pay | Admitting: Thoracic Surgery (Cardiothoracic Vascular Surgery)

## 2021-06-09 IMAGING — DX DG CERVICAL SPINE WITH FLEX & EXTEND
7 series · 7 of 7 positions shown · non-contrast
Comparison: None.

CLINICAL DATA: Advanced rheumatoid arthritis. Needs general
anesthesia. Evaluate for atlantoaxial instability. No reported
injury.

EXAM:
CERVICAL SPINE COMPLETE WITH FLEXION AND EXTENSION VIEWS

[c-spine lat]
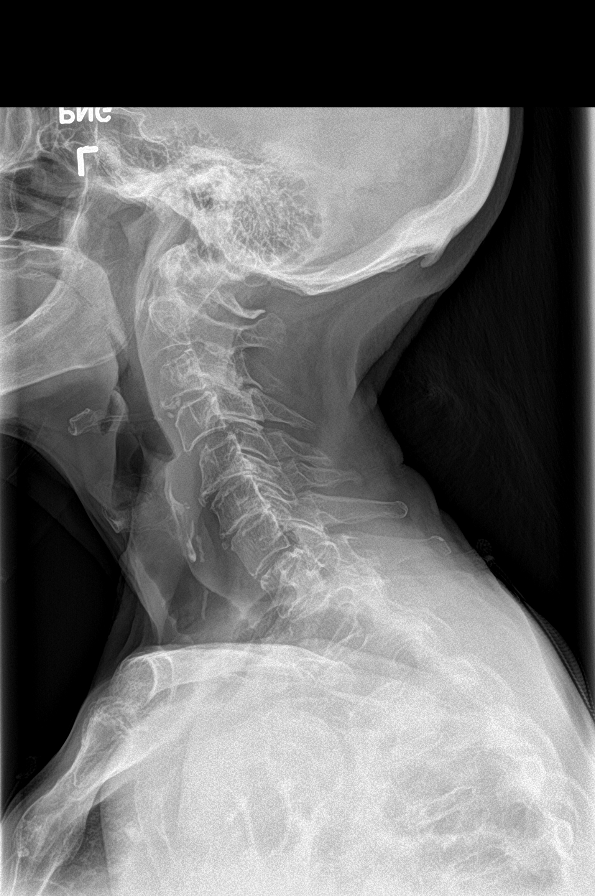

[c-spine flex]
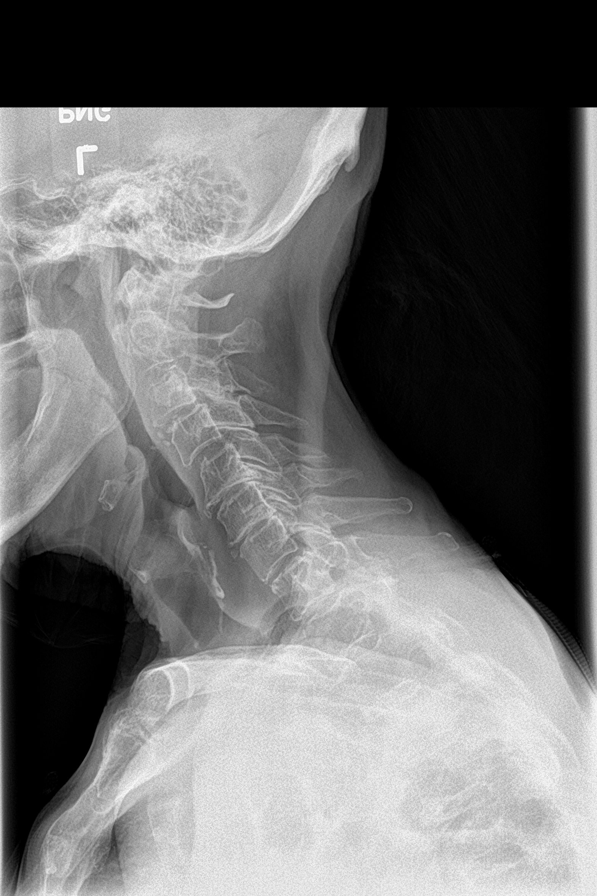

[c-spine ext]
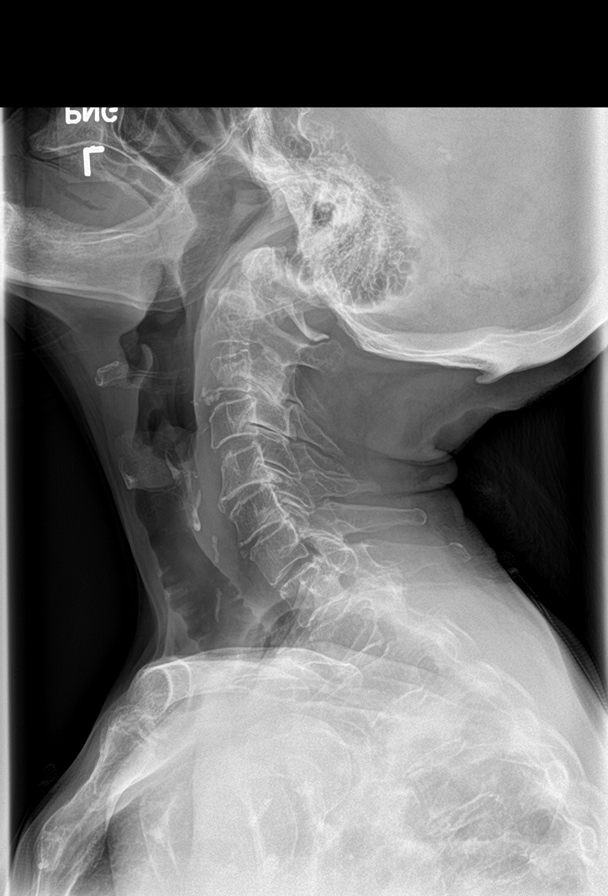

[c-spine obl (1 of 2)]
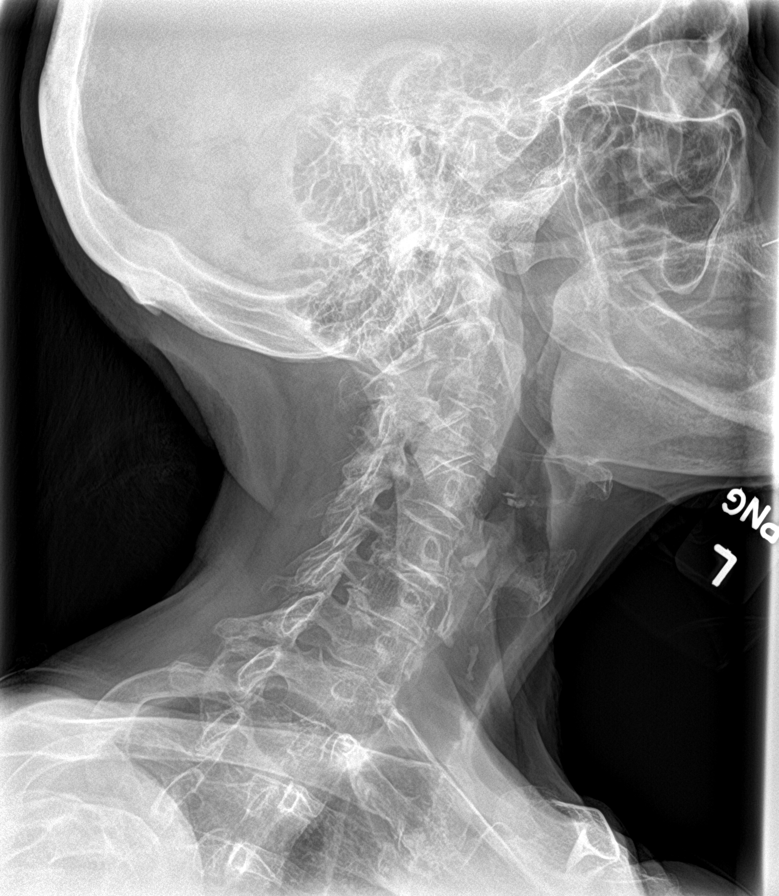

[c-spine obl (2 of 2)]
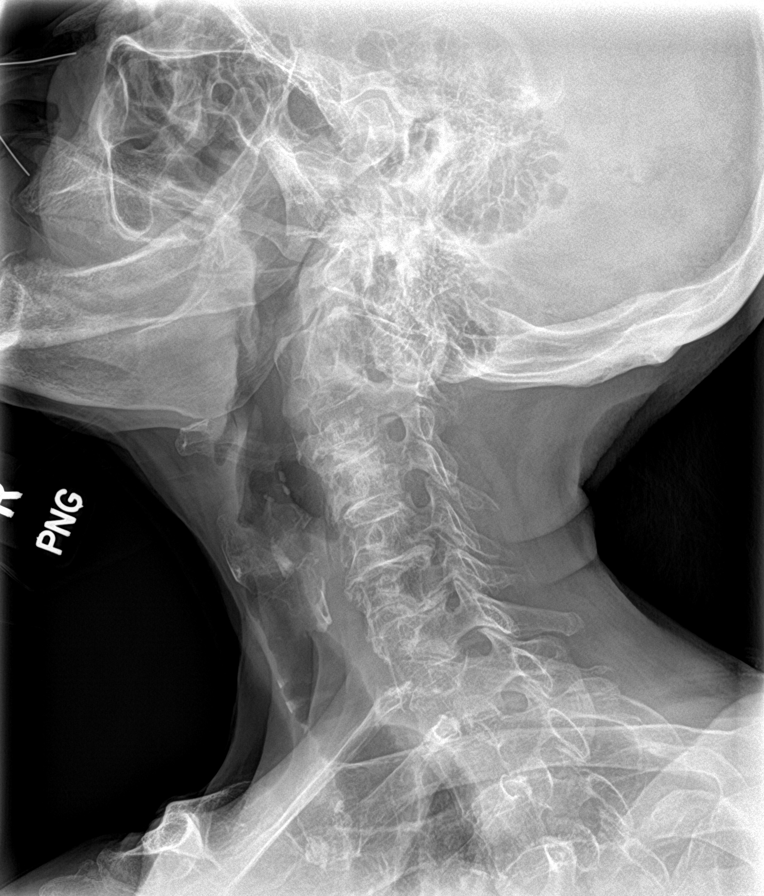

[c-spine ap]
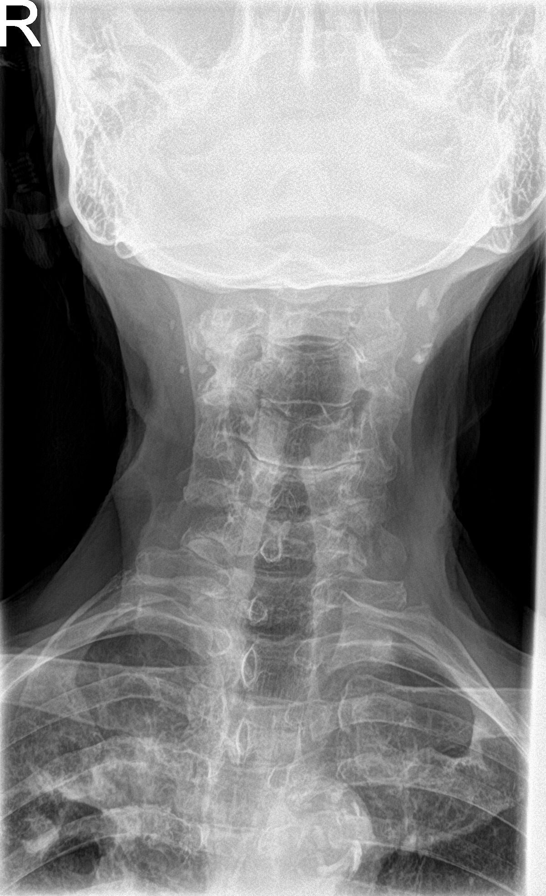

[c-spine open mouth]
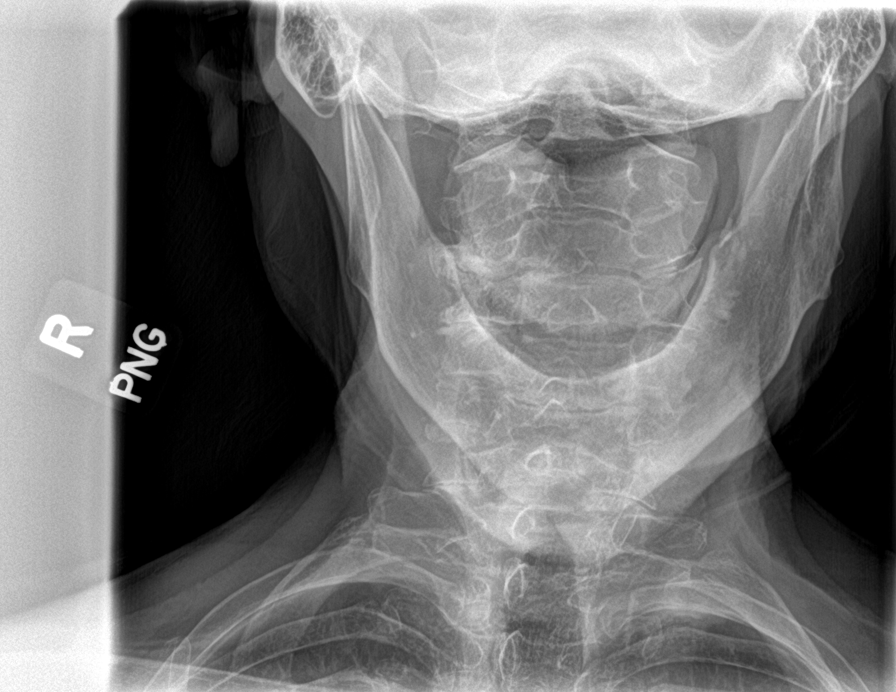

[7 of 7 positions shown; findings below may reference images not displayed]

FINDINGS: On the lateral view the cervical spine is visualized to the level of
C7-T1. There is a normal cervical lordosis. Pre-vertebral soft
tissues are within normal limits. No fracture is detected in the
cervical spine. Erosive changes are present in the superior dens,
which is well positioned between the lateral masses of C1.
Mild-to-moderate multilevel cervical degenerative disc disease, most
prominent at C5-6. There is 2 mm anterolisthesis at C3-4, unchanged
on flexion and extension. There is 2 mm anterolisthesis at C4-5,
measuring 2 mm on flexion and 0 mm on extension. There is evidence
of mild abnormal motion between the eroded dens and anterior C1 arch
with flexion and extension; for example the distance between the
inferior C1 arch and superior Mundy ring measures 7 mm at rest and
4 mm with flexion. A vertical line drawn parallel to the posterior
margin of the dens intersects the posterosuperior margin of the
anterior C1 arch at rest, is 3 mm posterior to the posterosuperior
margin of the anterior C1 arch with flexion, and is 2 mm anterior to
the posterosuperior margin of the anterior C1 arch with extension.
No evidence of basilar invagination. Mild bilateral facet
arthropathy. Mild degenerative foraminal stenosis on the right at
C3-4 and C6-7. No significant degenerative foraminal stenosis on the
left. No aggressive-appearing focal osseous lesions.
IMPRESSION: 1. Erosive changes in the superior dens. Mild dynamic instability at
the anterior atlantoaxial articulation as detailed. No evidence of
basilar invagination.
2. Mild-to-moderate multilevel cervical degenerative disc disease,
most prominent at C5-6.
3. Mild bilateral facet arthropathy.
4. Mild multilevel degenerative foraminal stenosis on the right as
detailed.
5. Minimal multilevel cervical spondylolisthesis, with minimal
dynamic instability at C4-5 as detailed.

## 2021-11-02 IMAGING — CT CT CHEST W/O CM
2 of 4 series · 12 of 36 positions shown, 15 images · non-contrast
Comparison: CT chest, 04/10/2019, PET-CT, 01/23/2019

CLINICAL DATA: Mediastinal mass, cavitary lung lesions,
mycobacterial infection

EXAM:
CT CHEST WITHOUT CONTRAST
TECHNIQUE: Multidetector CT imaging of the chest was performed following the
standard protocol without IV contrast.

[Series 2: chest 2.00 br40 s3 · axial · 0.44mm/px · z∈[+1407,+1669]mm · 9 of 157 slices shown, 12 images (1 of 2)]
[im 13/157  mediastinal]
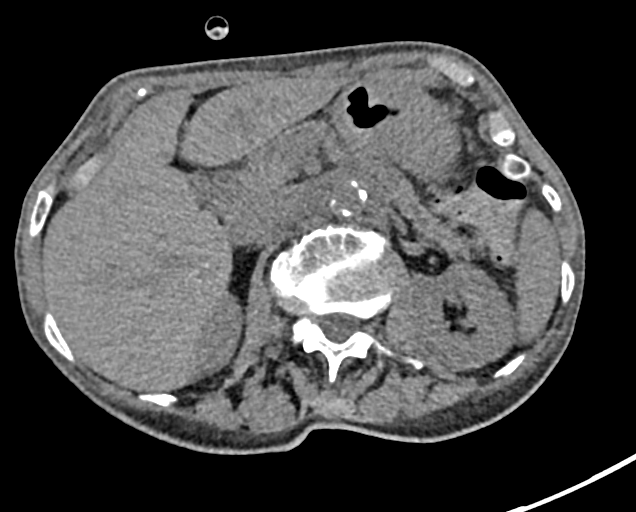
[im 13/157  lung]
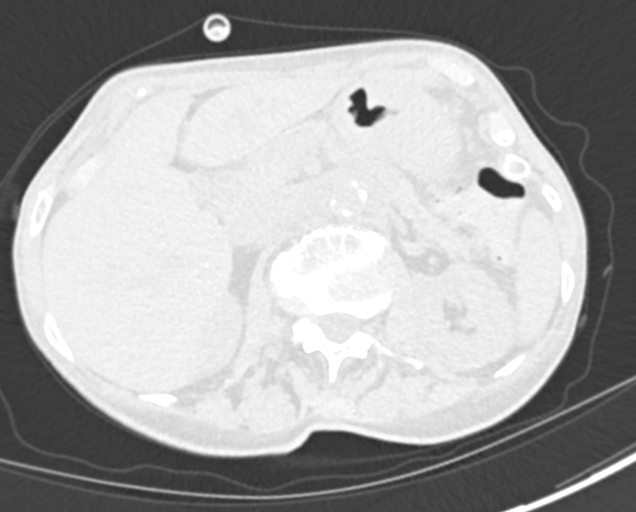
[im 37/157  lung]
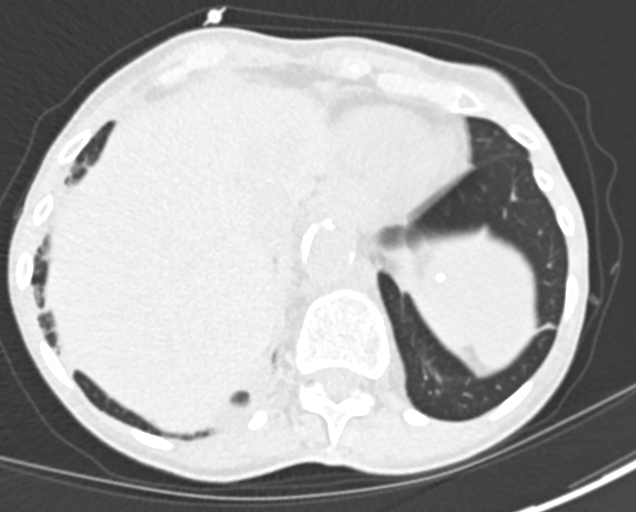
[im 49/157  lung]
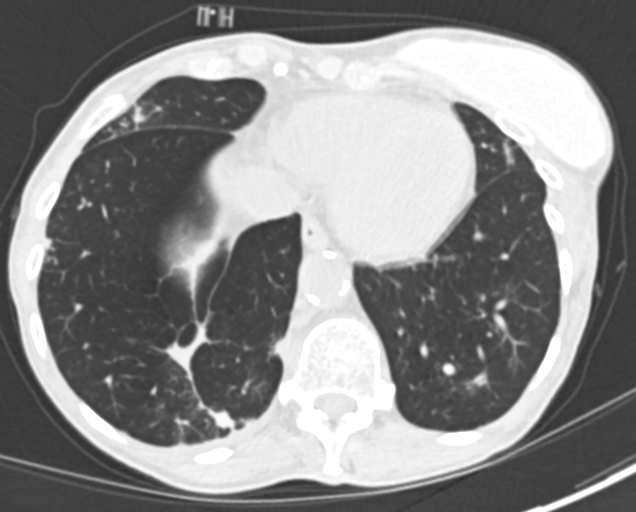
[im 61/157  lung]
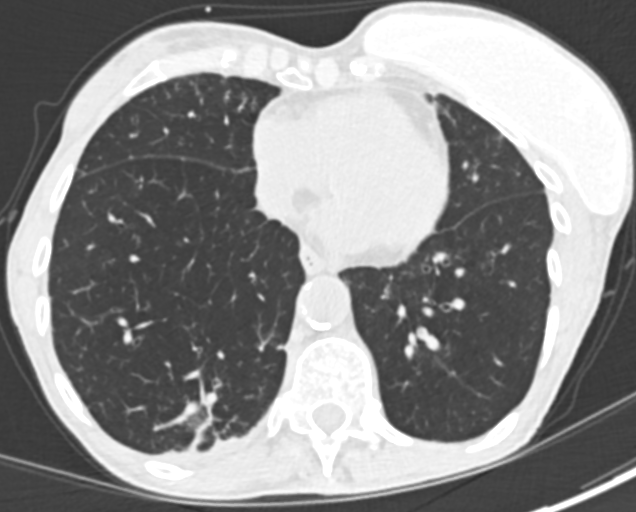
[im 85/157  mediastinal]
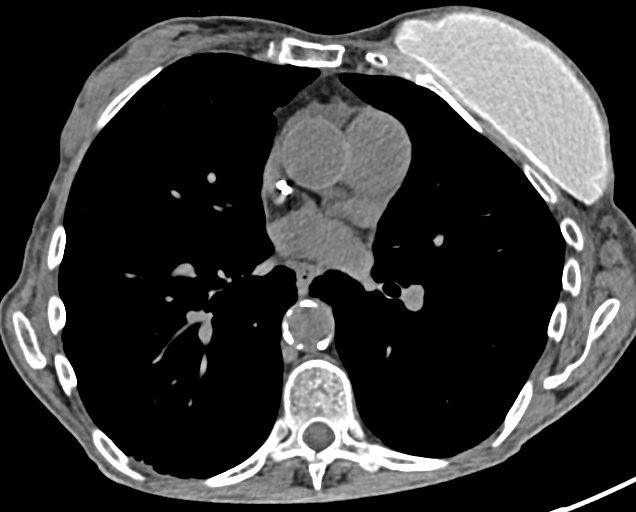
[im 85/157  lung]
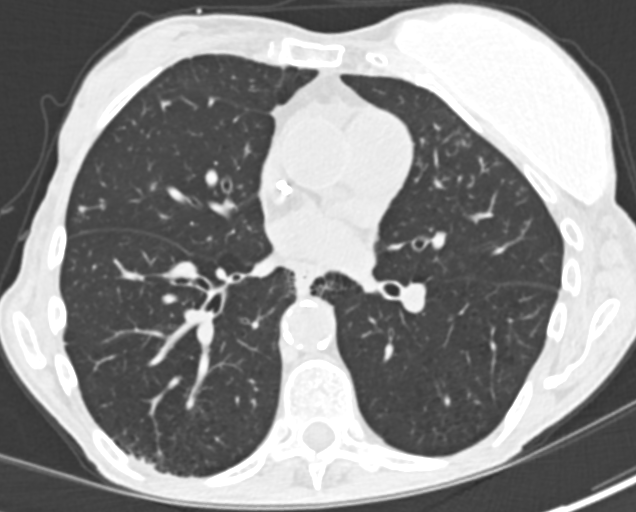
[im 97/157  lung]
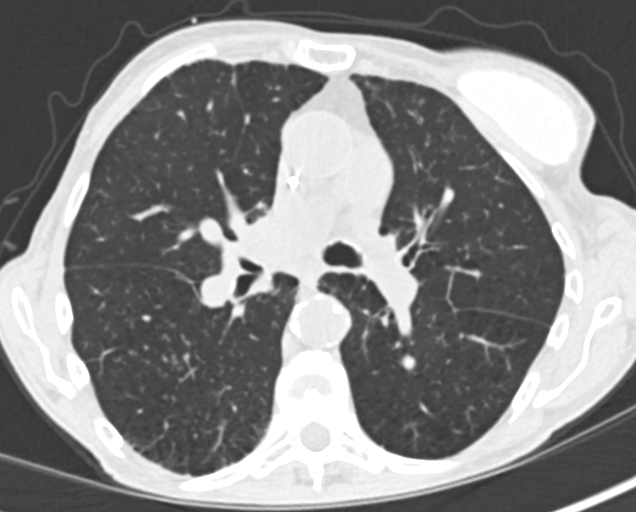
[im 109/157  lung]
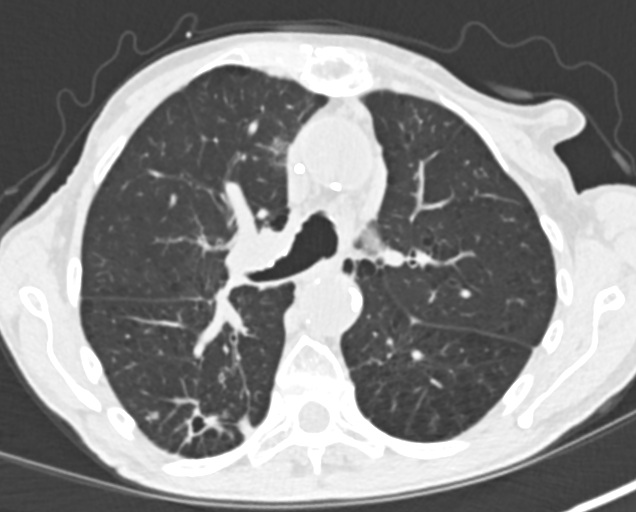
[im 133/157  lung]
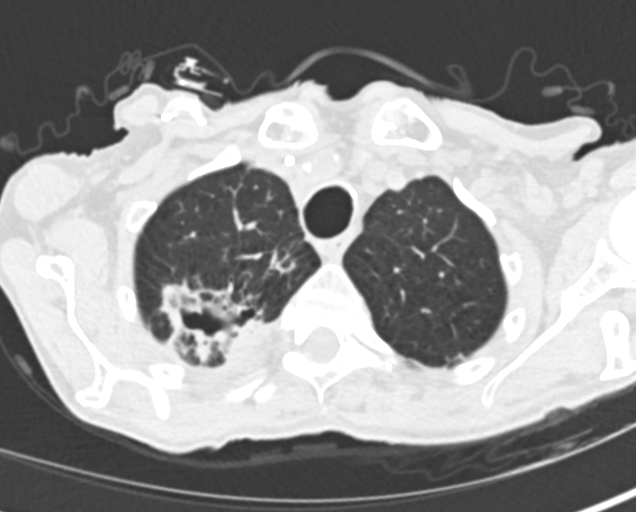
[im 145/157  mediastinal]
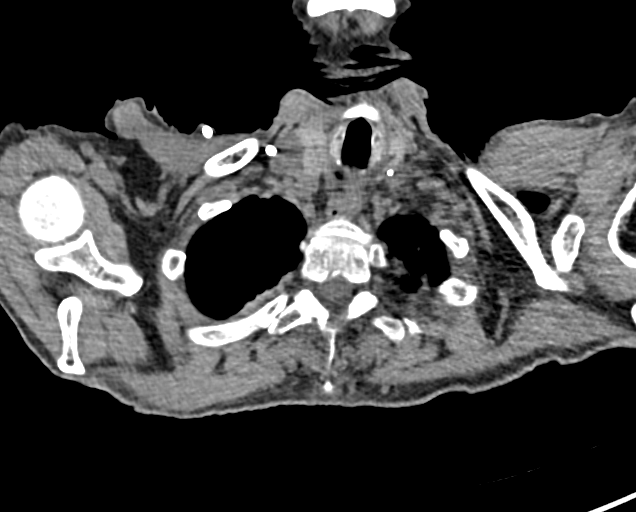
[im 145/157  lung]
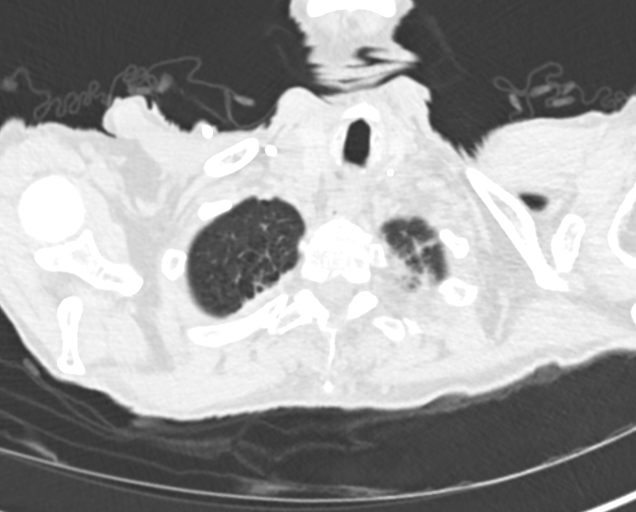

[Series 4: chest 2.00 br40 s3 · coronal · 0.55mm/px · 3 of 113 slices shown (2 of 2)]
[im 23/113  lung]
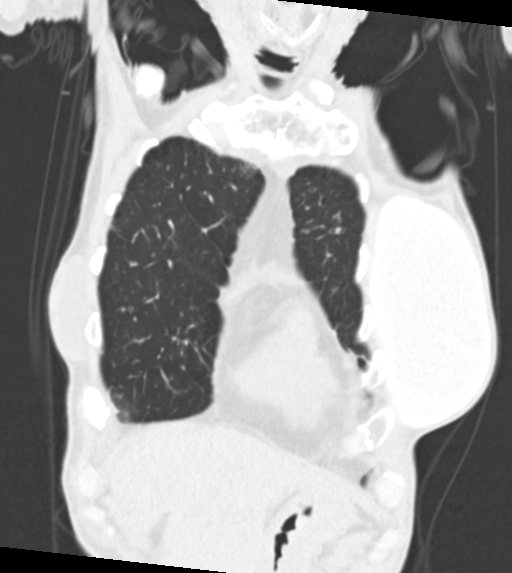
[im 45/113  lung]
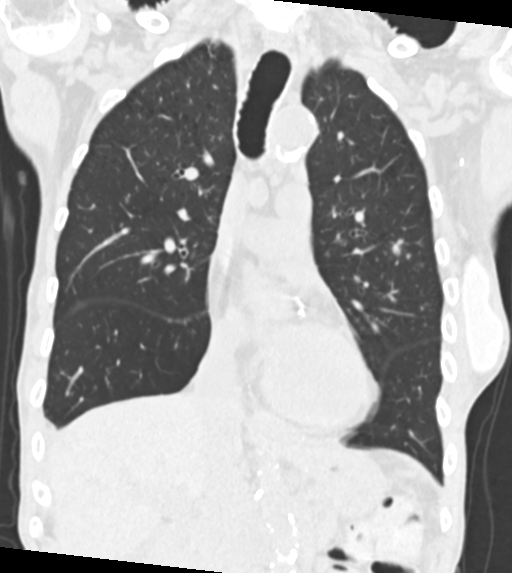
[im 68/113  lung]
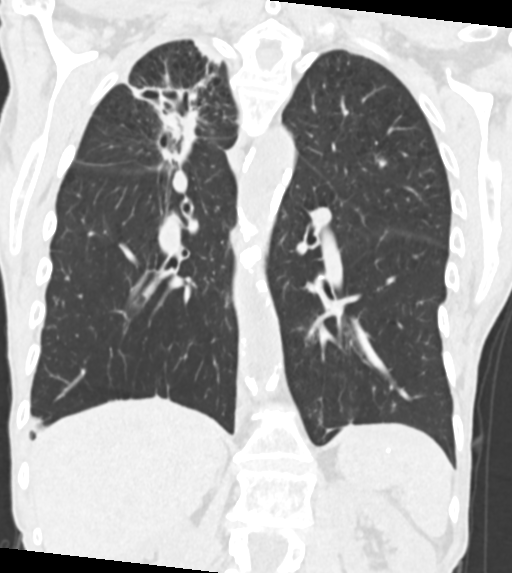

[12 of 36 positions shown; findings below may reference images not displayed]

FINDINGS: Cardiovascular: Aortic atherosclerosis. Normal heart size. Left
coronary artery calcifications. No pericardial effusion. Right chest
port catheter

Mediastinum/Nodes: No enlarged mediastinal, hilar, or axillary lymph
nodes. No significant change in a soft tissue mass of the anterior
mediastinum measuring approximately 3.6 x 1.5 cm, which is not well
distinguished from adjacent vessels and soft tissue on noncontrast
examination (series 2, image 42). Calcified nodes in keeping with
prior granulomatous infection thyroid gland, trachea, and esophagus
demonstrate no significant findings.

Lungs/Pleura: Centrilobular emphysema. No significant change in a
large cavitary lesion of the posterior right upper lobe and superior
segment right lower lobe. There are multiple additional cavitary
lesions, nodules, and clustered nodularity, generally improved
compared to prior examination, for example significantly decreased
cavitation of a lesion of the superior segment left lower lobe
(series 8, image 44) and diminished nodularity of the superior
segment right lower lobe (series 8, image 55). There is however
fluctuant nodularity of the anterior lingula with at least 1
enlarged nodule measuring 6 mm (series 8, image 69) and new
clustered nodularity and ground-glass of the dependent left lower
lobe (series 8, image 111). No pleural effusion or pneumothorax.

Upper Abdomen: No acute abnormality.

Musculoskeletal: No chest wall mass or suspicious bone lesions
identified.
IMPRESSION: 1. No significant change in a large cavitary lesion of the posterior
right upper lobe and superior segment right lower lobe.

2. There are multiple additional smaller cavitary lesions, nodules,
and clustered nodularity, generally improved and with decreased
cavitation, however there is new and fluctuant nodularity of the
lingula and dependent left lower lobe. The overall constellation is
of improved although ongoing atypical infection.

3. No significant change in a soft tissue mass of the anterior
mediastinum, not well distinguished from adjacent vessels in soft
tissue on noncontrast examination. This remains concerning for
malignancy although may reflect fibrosing mediastinitis in the
setting of atypical mycobacterial infection.

4.  Emphysema (QXR8V-IV7.U).

5.  Coronary artery disease.  Aortic Atherosclerosis (QXR8V-GQ2.2).
